# Patient Record
Sex: Female | Born: 1985 | Race: Black or African American | Hispanic: No | Marital: Single | Smoking: Former smoker
Health system: Southern US, Community
[De-identification: ages and names within clinical notes are randomized; demographics above are authoritative.]

## PROBLEM LIST (undated history)

## (undated) ENCOUNTER — Inpatient Hospital Stay (HOSPITAL_COMMUNITY): Payer: Self-pay

## (undated) ENCOUNTER — Emergency Department (HOSPITAL_COMMUNITY): Payer: Self-pay

## (undated) DIAGNOSIS — R519 Headache, unspecified: Secondary | ICD-10-CM

## (undated) DIAGNOSIS — J302 Other seasonal allergic rhinitis: Secondary | ICD-10-CM

## (undated) DIAGNOSIS — Z9114 Patient's other noncompliance with medication regimen: Secondary | ICD-10-CM

## (undated) DIAGNOSIS — Z5189 Encounter for other specified aftercare: Secondary | ICD-10-CM

## (undated) DIAGNOSIS — T7840XA Allergy, unspecified, initial encounter: Secondary | ICD-10-CM

## (undated) DIAGNOSIS — E0590 Thyrotoxicosis, unspecified without thyrotoxic crisis or storm: Secondary | ICD-10-CM

## (undated) DIAGNOSIS — K5190 Ulcerative colitis, unspecified, without complications: Secondary | ICD-10-CM

## (undated) DIAGNOSIS — D649 Anemia, unspecified: Secondary | ICD-10-CM

## (undated) DIAGNOSIS — F419 Anxiety disorder, unspecified: Secondary | ICD-10-CM

## (undated) HISTORY — DX: Anxiety disorder, unspecified: F41.9

## (undated) HISTORY — DX: Other seasonal allergic rhinitis: J30.2

## (undated) HISTORY — PX: TONSILLECTOMY: SUR1361

## (undated) HISTORY — DX: Allergy, unspecified, initial encounter: T78.40XA

---
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
## 2005-11-20 DIAGNOSIS — K5190 Ulcerative colitis, unspecified, without complications: Secondary | ICD-10-CM

## 2005-11-20 DIAGNOSIS — K5180 Other ulcerative colitis without complications: Secondary | ICD-10-CM | POA: Insufficient documentation

## 2005-11-20 HISTORY — DX: Ulcerative colitis, unspecified, without complications: K51.90

## 2006-04-02 ENCOUNTER — Emergency Department (HOSPITAL_COMMUNITY): Admission: EM | Admit: 2006-04-02 | Discharge: 2006-04-02 | Payer: Self-pay | Admitting: Emergency Medicine

## 2006-11-20 DIAGNOSIS — D649 Anemia, unspecified: Secondary | ICD-10-CM

## 2006-11-20 HISTORY — DX: Anemia, unspecified: D64.9

## 2007-01-15 ENCOUNTER — Inpatient Hospital Stay (HOSPITAL_COMMUNITY): Admission: EM | Admit: 2007-01-15 | Discharge: 2007-01-23 | Payer: Self-pay | Admitting: Emergency Medicine

## 2007-01-15 ENCOUNTER — Ambulatory Visit (HOSPITAL_COMMUNITY): Admission: RE | Admit: 2007-01-15 | Discharge: 2007-01-15 | Payer: Self-pay | Admitting: Gastroenterology

## 2007-01-18 ENCOUNTER — Encounter (INDEPENDENT_AMBULATORY_CARE_PROVIDER_SITE_OTHER): Payer: Self-pay | Admitting: Specialist

## 2007-01-24 ENCOUNTER — Ambulatory Visit: Payer: Self-pay | Admitting: Gastroenterology

## 2007-04-11 ENCOUNTER — Ambulatory Visit (HOSPITAL_COMMUNITY): Admission: RE | Admit: 2007-04-11 | Discharge: 2007-04-11 | Payer: Self-pay | Admitting: Gastroenterology

## 2007-09-11 ENCOUNTER — Emergency Department (HOSPITAL_COMMUNITY): Admission: EM | Admit: 2007-09-11 | Discharge: 2007-09-11 | Payer: Self-pay | Admitting: Emergency Medicine

## 2007-09-17 ENCOUNTER — Ambulatory Visit: Payer: Self-pay | Admitting: Family Medicine

## 2007-09-19 ENCOUNTER — Ambulatory Visit: Payer: Self-pay | Admitting: *Deleted

## 2007-12-06 ENCOUNTER — Inpatient Hospital Stay (HOSPITAL_COMMUNITY): Admission: EM | Admit: 2007-12-06 | Discharge: 2007-12-08 | Payer: Self-pay | Admitting: Emergency Medicine

## 2007-12-10 ENCOUNTER — Emergency Department (HOSPITAL_COMMUNITY): Admission: EM | Admit: 2007-12-10 | Discharge: 2007-12-11 | Payer: Self-pay | Admitting: Emergency Medicine

## 2007-12-12 ENCOUNTER — Ambulatory Visit: Payer: Self-pay | Admitting: Family Medicine

## 2008-01-03 ENCOUNTER — Ambulatory Visit: Payer: Self-pay | Admitting: Internal Medicine

## 2008-01-19 ENCOUNTER — Emergency Department (HOSPITAL_COMMUNITY): Admission: EM | Admit: 2008-01-19 | Discharge: 2008-01-20 | Payer: Self-pay | Admitting: Emergency Medicine

## 2008-02-20 ENCOUNTER — Ambulatory Visit: Payer: Self-pay | Admitting: Family Medicine

## 2008-03-16 ENCOUNTER — Ambulatory Visit: Payer: Self-pay | Admitting: Family Medicine

## 2008-03-16 LAB — CONVERTED CEMR LAB
AST: 13 units/L (ref 0–37)
BUN: 6 mg/dL (ref 6–23)
Basophils Absolute: 0 10*3/uL (ref 0.0–0.1)
Calcium: 8.9 mg/dL (ref 8.4–10.5)
Chloride: 109 meq/L (ref 96–112)
Creatinine, Ser: 0.65 mg/dL (ref 0.40–1.20)
Eosinophils Absolute: 0.5 10*3/uL (ref 0.0–0.7)
Eosinophils Relative: 5 % (ref 0–5)
HCT: 25.7 % — ABNORMAL LOW (ref 36.0–46.0)
Hemoglobin: 7.3 g/dL — CL (ref 12.0–15.0)
Lymphocytes Relative: 37 % (ref 12–46)
MCHC: 28.9 g/dL — ABNORMAL LOW (ref 30.0–36.0)
MCV: 70.1 fL — ABNORMAL LOW (ref 78.0–100.0)
Monocytes Absolute: 1.5 10*3/uL — ABNORMAL HIGH (ref 0.1–1.0)
Platelets: 398 10*3/uL (ref 150–400)
RDW: 19.6 % — ABNORMAL HIGH (ref 11.5–15.5)
TSH: 0.287 microintl units/mL — ABNORMAL LOW (ref 0.350–5.50)
Total Bilirubin: 0.1 mg/dL — ABNORMAL LOW (ref 0.3–1.2)

## 2008-03-17 ENCOUNTER — Encounter (INDEPENDENT_AMBULATORY_CARE_PROVIDER_SITE_OTHER): Payer: Self-pay | Admitting: Family Medicine

## 2008-03-17 LAB — CONVERTED CEMR LAB
Iron: <10 ug/dL — ABNORMAL LOW (ref 42–145)
Retic Ct Pct: 1.1 % (ref 0.4–3.1)
UIBC: 419 ug/dL

## 2008-04-23 ENCOUNTER — Encounter (INDEPENDENT_AMBULATORY_CARE_PROVIDER_SITE_OTHER): Payer: Self-pay | Admitting: Family Medicine

## 2008-04-23 ENCOUNTER — Ambulatory Visit: Payer: Self-pay | Admitting: Internal Medicine

## 2008-04-23 LAB — CONVERTED CEMR LAB
Basophils Absolute: 0 10*3/uL (ref 0.0–0.1)
Eosinophils Absolute: 0.2 10*3/uL (ref 0.0–0.7)
Eosinophils Relative: 2 % (ref 0–5)
HCT: 27.5 % — ABNORMAL LOW (ref 36.0–46.0)
MCV: 70.7 fL — ABNORMAL LOW (ref 78.0–100.0)
Platelets: 707 10*3/uL — ABNORMAL HIGH (ref 150–400)
RDW: 23.4 % — ABNORMAL HIGH (ref 11.5–15.5)

## 2008-06-18 ENCOUNTER — Emergency Department (HOSPITAL_COMMUNITY): Admission: EM | Admit: 2008-06-18 | Discharge: 2008-06-18 | Payer: Self-pay | Admitting: Family Medicine

## 2008-06-20 ENCOUNTER — Emergency Department (HOSPITAL_COMMUNITY): Admission: EM | Admit: 2008-06-20 | Discharge: 2008-06-21 | Payer: Self-pay | Admitting: Emergency Medicine

## 2008-06-21 LAB — CONVERTED CEMR LAB
CO2: 24 meq/L
Chloride: 104 meq/L
Platelets: 296 10*3/uL
Potassium: 3.9 meq/L
Sodium: 134 meq/L
WBC: 11.9 10*3/uL

## 2008-06-22 LAB — CONVERTED CEMR LAB: OCCULT 1: POSITIVE

## 2008-06-25 ENCOUNTER — Encounter (INDEPENDENT_AMBULATORY_CARE_PROVIDER_SITE_OTHER): Payer: Self-pay | Admitting: Nurse Practitioner

## 2008-06-30 ENCOUNTER — Ambulatory Visit: Payer: Self-pay | Admitting: Nurse Practitioner

## 2008-06-30 DIAGNOSIS — D649 Anemia, unspecified: Secondary | ICD-10-CM

## 2008-07-29 ENCOUNTER — Ambulatory Visit: Payer: Self-pay | Admitting: Nurse Practitioner

## 2008-07-29 DIAGNOSIS — L708 Other acne: Secondary | ICD-10-CM | POA: Insufficient documentation

## 2008-07-29 DIAGNOSIS — K5900 Constipation, unspecified: Secondary | ICD-10-CM

## 2008-07-30 ENCOUNTER — Encounter (INDEPENDENT_AMBULATORY_CARE_PROVIDER_SITE_OTHER): Payer: Self-pay | Admitting: Nurse Practitioner

## 2008-07-30 ENCOUNTER — Ambulatory Visit (HOSPITAL_COMMUNITY): Admission: RE | Admit: 2008-07-30 | Discharge: 2008-07-30 | Payer: Self-pay | Admitting: Nurse Practitioner

## 2008-07-30 LAB — CONVERTED CEMR LAB
Basophils Absolute: 0 10*3/uL (ref 0.0–0.1)
Eosinophils Relative: 3 % (ref 0–5)
HCT: 32.5 % — ABNORMAL LOW (ref 36.0–46.0)
Hemoglobin: 9.8 g/dL — ABNORMAL LOW (ref 12.0–15.0)
Lymphocytes Relative: 36 % (ref 12–46)
MCHC: 30.2 g/dL (ref 30.0–36.0)
Monocytes Absolute: 1.1 10*3/uL — ABNORMAL HIGH (ref 0.1–1.0)
RDW: 26.4 % — ABNORMAL HIGH (ref 11.5–15.5)

## 2008-08-03 ENCOUNTER — Ambulatory Visit: Payer: Self-pay | Admitting: Nurse Practitioner

## 2008-08-03 DIAGNOSIS — E0590 Thyrotoxicosis, unspecified without thyrotoxic crisis or storm: Secondary | ICD-10-CM

## 2008-08-03 LAB — CONVERTED CEMR LAB
AST: 13 units/L (ref 0–37)
BUN: 6 mg/dL (ref 6–23)
Blood in Urine, dipstick: NEGATIVE
CO2: 22 meq/L (ref 19–32)
Calcium: 9.2 mg/dL (ref 8.4–10.5)
Chloride: 105 meq/L (ref 96–112)
Cholesterol: 175 mg/dL (ref 0–200)
Creatinine, Ser: 0.63 mg/dL (ref 0.40–1.20)
Glucose, Bld: 92 mg/dL (ref 70–99)
HDL: 54 mg/dL (ref >39)
Nitrite: NEGATIVE
Specific Gravity, Urine: 1.02
Total CHOL/HDL Ratio: 3.2
Triglycerides: 99 mg/dL (ref <150)
WBC Urine, dipstick: NEGATIVE

## 2008-08-04 ENCOUNTER — Emergency Department (HOSPITAL_COMMUNITY): Admission: EM | Admit: 2008-08-04 | Discharge: 2008-08-04 | Payer: Self-pay | Admitting: Emergency Medicine

## 2008-08-06 ENCOUNTER — Telehealth (INDEPENDENT_AMBULATORY_CARE_PROVIDER_SITE_OTHER): Payer: Self-pay | Admitting: Nurse Practitioner

## 2008-08-12 ENCOUNTER — Encounter (INDEPENDENT_AMBULATORY_CARE_PROVIDER_SITE_OTHER): Payer: Self-pay | Admitting: Nurse Practitioner

## 2008-09-03 ENCOUNTER — Telehealth (INDEPENDENT_AMBULATORY_CARE_PROVIDER_SITE_OTHER): Payer: Self-pay | Admitting: Nurse Practitioner

## 2008-09-07 ENCOUNTER — Telehealth (INDEPENDENT_AMBULATORY_CARE_PROVIDER_SITE_OTHER): Payer: Self-pay | Admitting: Nurse Practitioner

## 2008-09-15 ENCOUNTER — Encounter (INDEPENDENT_AMBULATORY_CARE_PROVIDER_SITE_OTHER): Payer: Self-pay | Admitting: Nurse Practitioner

## 2008-10-09 ENCOUNTER — Encounter (INDEPENDENT_AMBULATORY_CARE_PROVIDER_SITE_OTHER): Payer: Self-pay | Admitting: Nurse Practitioner

## 2009-03-18 ENCOUNTER — Telehealth (INDEPENDENT_AMBULATORY_CARE_PROVIDER_SITE_OTHER): Payer: Self-pay | Admitting: Nurse Practitioner

## 2009-04-21 ENCOUNTER — Emergency Department (HOSPITAL_COMMUNITY): Admission: EM | Admit: 2009-04-21 | Discharge: 2009-04-21 | Payer: Self-pay | Admitting: Family Medicine

## 2009-05-20 ENCOUNTER — Ambulatory Visit: Payer: Self-pay | Admitting: Nurse Practitioner

## 2009-05-29 ENCOUNTER — Emergency Department (HOSPITAL_COMMUNITY): Admission: EM | Admit: 2009-05-29 | Discharge: 2009-05-29 | Payer: Self-pay | Admitting: Family Medicine

## 2009-05-30 ENCOUNTER — Emergency Department (HOSPITAL_COMMUNITY): Admission: EM | Admit: 2009-05-30 | Discharge: 2009-05-31 | Payer: Self-pay | Admitting: Emergency Medicine

## 2009-06-02 ENCOUNTER — Telehealth (INDEPENDENT_AMBULATORY_CARE_PROVIDER_SITE_OTHER): Payer: Self-pay | Admitting: Nurse Practitioner

## 2009-06-03 ENCOUNTER — Inpatient Hospital Stay (HOSPITAL_COMMUNITY): Admission: EM | Admit: 2009-06-03 | Discharge: 2009-06-10 | Payer: Self-pay | Admitting: Emergency Medicine

## 2009-06-08 ENCOUNTER — Encounter (INDEPENDENT_AMBULATORY_CARE_PROVIDER_SITE_OTHER): Payer: Self-pay | Admitting: Gastroenterology

## 2009-06-11 ENCOUNTER — Encounter (INDEPENDENT_AMBULATORY_CARE_PROVIDER_SITE_OTHER): Payer: Self-pay | Admitting: *Deleted

## 2009-07-15 ENCOUNTER — Encounter (INDEPENDENT_AMBULATORY_CARE_PROVIDER_SITE_OTHER): Payer: Self-pay | Admitting: *Deleted

## 2009-08-05 ENCOUNTER — Encounter (INDEPENDENT_AMBULATORY_CARE_PROVIDER_SITE_OTHER): Payer: Self-pay | Admitting: Nurse Practitioner

## 2009-08-14 ENCOUNTER — Ambulatory Visit: Payer: Self-pay | Admitting: Infectious Diseases

## 2009-08-14 ENCOUNTER — Encounter (INDEPENDENT_AMBULATORY_CARE_PROVIDER_SITE_OTHER): Payer: Self-pay | Admitting: Dermatology

## 2009-08-14 ENCOUNTER — Inpatient Hospital Stay (HOSPITAL_COMMUNITY): Admission: EM | Admit: 2009-08-14 | Discharge: 2009-08-16 | Payer: Self-pay | Admitting: Emergency Medicine

## 2009-08-16 ENCOUNTER — Encounter (INDEPENDENT_AMBULATORY_CARE_PROVIDER_SITE_OTHER): Payer: Self-pay | Admitting: Dermatology

## 2009-12-22 ENCOUNTER — Ambulatory Visit: Payer: Self-pay | Admitting: Nurse Practitioner

## 2009-12-22 DIAGNOSIS — K029 Dental caries, unspecified: Secondary | ICD-10-CM | POA: Insufficient documentation

## 2010-01-24 ENCOUNTER — Encounter (INDEPENDENT_AMBULATORY_CARE_PROVIDER_SITE_OTHER): Payer: Self-pay | Admitting: Nurse Practitioner

## 2010-01-26 ENCOUNTER — Telehealth (INDEPENDENT_AMBULATORY_CARE_PROVIDER_SITE_OTHER): Payer: Self-pay | Admitting: Nurse Practitioner

## 2010-02-01 ENCOUNTER — Telehealth (INDEPENDENT_AMBULATORY_CARE_PROVIDER_SITE_OTHER): Payer: Self-pay | Admitting: Nurse Practitioner

## 2010-03-09 ENCOUNTER — Emergency Department (HOSPITAL_COMMUNITY): Admission: EM | Admit: 2010-03-09 | Discharge: 2010-03-09 | Payer: Self-pay | Admitting: Family Medicine

## 2010-05-31 ENCOUNTER — Telehealth (INDEPENDENT_AMBULATORY_CARE_PROVIDER_SITE_OTHER): Payer: Self-pay | Admitting: Nurse Practitioner

## 2010-07-14 ENCOUNTER — Emergency Department (HOSPITAL_COMMUNITY): Admission: EM | Admit: 2010-07-14 | Discharge: 2010-07-14 | Payer: Self-pay | Admitting: Family Medicine

## 2010-09-16 ENCOUNTER — Encounter (INDEPENDENT_AMBULATORY_CARE_PROVIDER_SITE_OTHER): Payer: Self-pay | Admitting: Nurse Practitioner

## 2010-09-21 ENCOUNTER — Emergency Department (HOSPITAL_COMMUNITY)
Admission: EM | Admit: 2010-09-21 | Discharge: 2010-09-21 | Payer: Self-pay | Source: Home / Self Care | Admitting: Family Medicine

## 2010-11-04 ENCOUNTER — Ambulatory Visit: Payer: Self-pay | Admitting: Nurse Practitioner

## 2010-11-20 NOTE — L&D Delivery Note (Signed)
Delivery Note Pt slowly progressed to complete dilation and then pushed well and at 5:45 AM a viable female was delivered via Vaginal, Spontaneous Delivery (Presentation: ;  ).  APGAR: 6, 8; weight 5 lb 7.8 oz (2490 g).  The baby was initially very floppy and required bag/mask with Dr. Norval Gable called to attend--responded very quickly to this and was taken to regular nursery for observation.  Mother developed temp in labor and was placed on Unasyn for presumed chorioamnionitis.  This will be continued postpartum. Placenta status:  delivered spontaneously and sent to pathology.  Cord: 3 vessels with the following complications: .  Cord pH: 7.2  Anesthesia: Epidural  Episiotomy: no Lacerations: first degree Suture Repair: 3.0 vicryl rapide Est. Blood Loss (mL):450cc   Mom to postpartum.  Baby to nursery-stable.  Logan Bores 10/21/2011, 6:26 AM

## 2010-12-15 ENCOUNTER — Emergency Department (HOSPITAL_COMMUNITY)
Admission: EM | Admit: 2010-12-15 | Discharge: 2010-12-15 | Payer: Self-pay | Source: Home / Self Care | Admitting: Family Medicine

## 2010-12-15 LAB — CBC
MCH: 25.3 pg — ABNORMAL LOW (ref 26.0–34.0)
MCHC: 32 g/dL (ref 30.0–36.0)
Platelets: 379 10*3/uL (ref 150–400)
RDW: 17.2 % — ABNORMAL HIGH (ref 11.5–15.5)

## 2010-12-15 LAB — IRON AND TIBC
Iron: 21 ug/dL — ABNORMAL LOW (ref 42–135)
TIBC: 454 ug/dL (ref 250–470)

## 2010-12-15 LAB — FERRITIN: Ferritin: 3 ng/mL — ABNORMAL LOW (ref 10–291)

## 2010-12-20 NOTE — Progress Notes (Signed)
Summary: TEETH PAIN/NEED RX  Phone Note Call from Patient Call back at [phone] CELL   Summary of Call: [place] ASKING FOR SOME MEDICATION FOR HER TEETH PAIN. SHE DID DO ELIGIBILITY AND I TOLD HER TO CALL THE DENTAL CLINIC AND CHECK ON HER REFERRAL.  SHE USES [facility]. Initial call taken by: Roberto Scales,  May 31, 2010 12:59 PM  Follow-up for Phone Call        spoke with pt and she said that she has been dealing with pain in her teeth for a year but she has had some major pain for the past month and is asking if she can get something for the pain.  Follow-up by: Isla Pence,  May 31, 2010 3:41 PM  Additional Follow-up for Phone Call Additional follow up Details #1::        have been through this before with pt and teeth pain  dental referral outstanding since 12/2009 she needs to call and see where she is on the list if phone # has changed they may have had trouble reaching her will fill tylenol #3 as needed for pain (rx in basket) most importantly pt needs dental care  Additional Follow-up by: Aurora Mask FNP,  May 31, 2010 3:46 PM    Additional Follow-up for Phone Call Additional follow up Details #2::    Isla Pence  June 01, 2010 4:22 PM Left message on machine for pt to return call to the office.  Rx faxed to [place] Pt notified.   Follow-up by: Isla Pence,  June 02, 2010 12:47 PM  Prescriptions: TYLENOL WITH CODEINE #3 300-30 MG TABS (ACETAMINOPHEN-CODEINE) One tablet by mouth two times a day as needed for pain  #30 x 0   Entered and Authorized by:   Aurora Mask FNP   Signed by:   Aurora Mask FNP on 05/31/2010   Method used:   Printed then faxed to ...       [facility] (retail)       [address] [place], [place]  [address]       Ph: [phone] [place]       Fax: [phone]   RxID:   [id number]

## 2010-12-20 NOTE — Letter (Signed)
Summary: DENTAL REFERRAL  DENTAL REFERRAL   Imported By: Roland Earl 03/23/2010 11:50:50  _____________________________________________________________________  External Attachment:    Type:   Image     Comment:   External Document

## 2010-12-20 NOTE — Assessment & Plan Note (Signed)
Summary: Acute - Dental pain   Vital Signs:  Patient profile:   25 year old female LMP:     11/2009 Weight:      114.1 pounds BMI:     23.93 BSA:     1.44 Temp:     97.7 degrees F oral Pulse rate:   80 / minute Pulse rhythm:   regular Resp:     20 per minute BP sitting:   120 / 70  (left arm) Cuff size:   regular  Vitals Entered By: Isla Pence (December 22, 2009 12:05 PM) CC: tooth pain x months pain that comes and goes Is Patient Diabetic? No Pain Assessment Patient in pain? yes     Location: tooth  Does patient need assistance? Functional Status Self care Ambulation Normal LMP (date): 11/2009 LMP - Character: normal     Enter LMP: 11/2009 Last PAP Result  Interpretation/Result:Negative for intraepithelial Lesion or Malignancy.      CC:  tooth pain x months pain that comes and goes.  History of Present Illness:  Pt into the office with tooth pain. Area of concern in Left upper molar and right lower molar. Pain is intermittent.  No sensitivity with hot or cold but texture makes eating on the right side difficult. no swelling of the jaw no fever Unable to recall the last dental appointment  Allergies (verified): 1)  ! * Latex  Review of Systems General:  Denies fever; mouth pain. CV:  Denies fatigue. Resp:  Denies cough. GI:  Denies abdominal pain and bloody stools; stable symptoms.  reports f/u with GI earlier this week.  Admits that she is taking meds as ordered.  Physical Exam  General:  alert.   Head:  normocephalic.   Mouth:  evidence of previous caries but still several teeth with evidenced caries Msk:  normal ROM.   Neurologic:  alert & oriented X3.     Impression & Recommendations:  Problem # 1:  DENTAL CARIES (ICD-521.00) advised pt that she needs dental referral fillings will give pain meds.  no indication for antibiotics at this time Orders: Dental Referral (Dentist)  Complete Medication List: 1)  Ferrous Sulfate 325 (65 Fe) Mg  Tabs (Ferrous sulfate) .[name].. 1 tablet by mouth two times a day to build up blood 2)  Colace 100 Mg Caps (Docusate sodium) .[name].. 1 capsule by mouth twice daily for constipation 3)  Lialda 1.2 Gm Tbec (Mesalamine) .... Please take 4 tabs by mouth daily starting September 12, 2009. 4)  Tylenol With Codeine #3 300-30 Mg Tabs (Acetaminophen-codeine) .... One tablet by mouth two times a day as needed for pain  Patient Instructions: 1)  You will be referred to the dental clinic.  You will be notified of the time/date of the appointment. 2)  You will need to get the tooth filled or removed as this is the cause for your pain. 3)  You can take pain medications two times a day as needed for pain. Prescriptions: TYLENOL WITH CODEINE #3 300-30 MG TABS (ACETAMINOPHEN-CODEINE) One tablet by mouth two times a day as needed for pain  #30 x 0   Entered and Authorized by:   Aurora Mask FNP   Signed by:   Aurora Mask FNP on 12/22/2009   Method used:   Print then Give to Patient   RxID:   [id number]

## 2010-12-20 NOTE — Letter (Signed)
Summary: MAILED REQUESTED RECORDS TO [facility]  MAILED REQUESTED RECORDS TO [facility] By: Roland Earl 09/16/2010 10:11:28  _____________________________________________________________________  External Attachment:    Type:   Image     Comment:   External Document

## 2010-12-20 NOTE — Progress Notes (Signed)
Summary: Pain medication  Phone Note Call from Patient   Summary of Call: when I spoke with pt on 3/14 and shared information from last note today pt is asking for something for pain. I asked her what pharmacy does she use she said [facility].  I told her that I would get this to her provider.  Initial call taken by: Isla Pence,  February 01, 2010 3:14 PM  Follow-up for Phone Call        Rx at station to fax to [facility] Please continue to stress to pt that she needs to find an affordable dentist that will remove her teeth  Pain medication is only temporary Follow-up by: Aurora Mask FNP,  February 01, 2010 3:21 PM  Additional Follow-up for Phone Call Additional follow up Details #1::        pt informed. Rx faxed to [facility]. Additional Follow-up by: Isla Pence,  February 01, 2010 3:59 PM    Prescriptions: TYLENOL WITH CODEINE #3 300-30 MG TABS (ACETAMINOPHEN-CODEINE) One tablet by mouth two times a day as needed for pain  #30 x 0   Entered and Authorized by:   Aurora Mask FNP   Signed by:   Aurora Mask FNP on 02/01/2010   Method used:   Print then Give to Patient   RxID:   [id number]

## 2010-12-20 NOTE — Progress Notes (Signed)
Summary: TEETH STILL HURTING  Phone Note Call from Patient Call back at Home Phone [phone]   Summary of Call: [place] SHE IS STILL HAVING REAL BAD TEETH PAIN AND AND SHE HAS HOLES IN BOTH OF THEM AND NOW SHE SAYS THAT SHE HAS SWELLING. PATIENT IS ASKING IFN YOU CAN CALL IN SOMETHING.  Keydi Giel I GOT A CALL FROM [place] THIS MRONING THAT ITS NOT AN EMERGENCY SHE WAS ONLY PUT ON PAIN MEDICINE. Initial call taken by: Roberto Scales,  January 26, 2010 2:27 PM  Follow-up for Phone Call        forward to Marva Panda, fnp Follow-up by: Isla Pence,  January 27, 2010 3:51 PM  Additional Follow-up for Phone Call Additional follow up Details #1::        Pt may have to get her own dental referral she does NOT have an abscess and does not substantiate an urgent dental referral. While i am aware the "holes" are likely cavities that have gradually progressed and yes she does need those teeth removed at this time the dental clinic to which we have access is unable to accommodate those without urgent needs and she fits that profile. I can refill her pain medications but again that is only short lived as she needs to find a dentist for extraction Additional Follow-up by: Aurora Mask FNP,  January 27, 2010 5:37 PM    Additional Follow-up for Phone Call Additional follow up Details #2::    Isla Pence  January 31, 2010 11:59 AM Left message on machine for pt to return call to the office.  Isla Pence  January 31, 2010 12:20 PM Pt informed.

## 2010-12-20 NOTE — Letter (Signed)
Summary: REFERRAL//DENTAL  REFERRAL//DENTAL   Imported By: Roland Earl 02/10/2010 12:28:16  _____________________________________________________________________  External Attachment:    Type:   Image     Comment:   External Document

## 2011-01-01 ENCOUNTER — Inpatient Hospital Stay (INDEPENDENT_AMBULATORY_CARE_PROVIDER_SITE_OTHER)
Admission: RE | Admit: 2011-01-01 | Discharge: 2011-01-01 | Disposition: A | Discharge status: Home / Self Care | Payer: Self-pay | Source: Ambulatory Visit | Attending: Family Medicine | Admitting: Family Medicine

## 2011-01-01 DIAGNOSIS — R109 Unspecified abdominal pain: Secondary | ICD-10-CM

## 2011-01-19 ENCOUNTER — Ambulatory Visit (HOSPITAL_COMMUNITY)
Admission: RE | Admit: 2011-01-19 | Discharge: 2011-01-19 | Disposition: A | Discharge status: Home / Self Care | Payer: Self-pay | Source: Ambulatory Visit | Attending: Gastroenterology | Admitting: Gastroenterology

## 2011-01-19 HISTORY — PX: COLONOSCOPY: SHX174

## 2011-02-03 LAB — POCT URINALYSIS DIPSTICK
Bilirubin Urine: NEGATIVE
Glucose, UA: NEGATIVE mg/dL
Ketones, ur: NEGATIVE mg/dL
Specific Gravity, Urine: 1.02 (ref 1.005–1.030)

## 2011-02-07 LAB — POCT I-STAT, CHEM 8
BUN: 5 mg/dL — ABNORMAL LOW (ref 6–23)
Creatinine, Ser: 0.3 mg/dL — ABNORMAL LOW (ref 0.4–1.2)
Potassium: 4 mEq/L (ref 3.5–5.1)
Sodium: 140 mEq/L (ref 135–145)

## 2011-02-21 NOTE — Op Note (Signed)
  NAMETAYLORE, Joyce Solomon NO.:  [id number]  MEDICAL RECORD NO.:  [id number]           PATIENT TYPE:  O  LOCATION:  WLEN                         FACILITY:  [facility]  PHYSICIAN:  Wonda Horner, M.D.   DATE OF BIRTH:  1986/01/09  DATE OF PROCEDURE:  01/19/2011 DATE OF DISCHARGE:                              OPERATIVE REPORT   INDICATIONS FOR PROCEDURE:  The patient is a 25 year old female with a history of ulcerative colitis.  She has been complaining of what she described as a flare-up and went to the emergency room for this, where she was given a short course of prednisone.  In talking to her in the office recently, I was unclear whether this was a flare-up or not and felt that proceeding with colonoscopy would be appropriate to determine if there was any activity of disease.  Informed consent was obtained after explanation of the risks of bleeding, infection and perforation.  PREMEDICATION:  Fentanyl 100 mcg IV, Versed 10 mg IV, Benadryl 25 mg IV.  PROCEDURE IN DETAIL:  With the patient in the left lateral decubitus position, a rectal exam was performed and no masses were felt.  The Pentax colonoscope was inserted into the rectum and advanced around the colon to the cecum.  Cecal landmarks were identified.  The cecum and ascending colon were normal.  The transverse colon normal.  The descending colon, sigmoid and rectum were normal.  Retroflexion was normal.  There was no evidence of activity of disease on this examination.  She tolerated the procedure well without complications.  IMPRESSION:  Normal colonoscopy with no evidence of active ulcerative colitis at this time.  PLAN:  Continue Lialda 4 pills daily.  There is no need for prednisone at this time.          ______________________________ Wonda Horner, M.D.     SFG/MEDQ  D:  01/19/2011  T:  01/19/2011  Job:  [id number]  Electronically Signed by Anson Fret MD on 02/21/2011 04:38:15 PM

## 2011-02-24 LAB — DIFFERENTIAL
Lymphocytes Relative: 14 % (ref 12–46)
Lymphs Abs: 2 10*3/uL (ref 0.7–4.0)
Monocytes Absolute: 0.7 10*3/uL (ref 0.1–1.0)
Monocytes Relative: 5 % (ref 3–12)
Neutro Abs: 10.7 10*3/uL — ABNORMAL HIGH (ref 1.7–7.7)
Neutrophils Relative %: 77 % (ref 43–77)

## 2011-02-24 LAB — COMPREHENSIVE METABOLIC PANEL
Albumin: 2.9 g/dL — ABNORMAL LOW (ref 3.5–5.2)
Alkaline Phosphatase: 65 U/L (ref 39–117)
BUN: 3 mg/dL — ABNORMAL LOW (ref 6–23)
Calcium: 8.4 mg/dL (ref 8.4–10.5)
Creatinine, Ser: 0.54 mg/dL (ref 0.4–1.2)
Glucose, Bld: 103 mg/dL — ABNORMAL HIGH (ref 70–99)
Potassium: 3.6 mEq/L (ref 3.5–5.1)
Total Protein: 6.8 g/dL (ref 6.0–8.3)

## 2011-02-24 LAB — CBC
HCT: 20.6 % — ABNORMAL LOW (ref 36.0–46.0)
Hemoglobin: 9.5 g/dL — ABNORMAL LOW (ref 12.0–15.0)
MCHC: 32.2 g/dL (ref 30.0–36.0)
MCHC: 32.3 g/dL (ref 30.0–36.0)
MCHC: 32.3 g/dL (ref 30.0–36.0)
Platelets: 518 10*3/uL — ABNORMAL HIGH (ref 150–400)
Platelets: 519 10*3/uL — ABNORMAL HIGH (ref 150–400)
Platelets: 600 10*3/uL — ABNORMAL HIGH (ref 150–400)
RBC: 3.75 MIL/uL — ABNORMAL LOW (ref 3.87–5.11)
RBC: 3.78 MIL/uL — ABNORMAL LOW (ref 3.87–5.11)
RBC: 3.94 MIL/uL (ref 3.87–5.11)
RDW: 22.5 % — ABNORMAL HIGH (ref 11.5–15.5)
WBC: 10 10*3/uL (ref 4.0–10.5)
WBC: 8.4 10*3/uL (ref 4.0–10.5)
WBC: 8.6 10*3/uL (ref 4.0–10.5)

## 2011-02-24 LAB — URINALYSIS, ROUTINE W REFLEX MICROSCOPIC
Glucose, UA: NEGATIVE mg/dL
Hgb urine dipstick: NEGATIVE
Specific Gravity, Urine: 1.016 (ref 1.005–1.030)
Urobilinogen, UA: 0.2 mg/dL (ref 0.0–1.0)

## 2011-02-24 LAB — BASIC METABOLIC PANEL
BUN: <1 mg/dL — ABNORMAL LOW (ref 6–23)
CO2: 24 mEq/L (ref 19–32)
Calcium: 8.3 mg/dL — ABNORMAL LOW (ref 8.4–10.5)
Calcium: 8.3 mg/dL — ABNORMAL LOW (ref 8.4–10.5)
Chloride: 110 mEq/L (ref 96–112)
Creatinine, Ser: 0.63 mg/dL (ref 0.4–1.2)
GFR calc Af Amer: >60 mL/min (ref >60)
GFR calc Af Amer: >60 mL/min (ref >60)
GFR calc non Af Amer: >60 mL/min (ref >60)
Sodium: 138 mEq/L (ref 135–145)

## 2011-02-24 LAB — TYPE AND SCREEN
Antibody Screen: POSITIVE
DAT, IgG: NEGATIVE

## 2011-02-24 LAB — CLOSTRIDIUM DIFFICILE EIA: C difficile Toxins A+B, EIA: NEGATIVE

## 2011-02-24 LAB — SEDIMENTATION RATE: Sed Rate: 85 mm/hr — ABNORMAL HIGH (ref 0–22)

## 2011-02-24 LAB — URINE MICROSCOPIC-ADD ON

## 2011-02-24 LAB — POCT PREGNANCY, URINE: Preg Test, Ur: NEGATIVE

## 2011-02-26 LAB — TSH: TSH: 0.412 u[IU]/mL (ref 0.350–4.500)

## 2011-02-26 LAB — CBC
HCT: 36.3 % (ref 36.0–46.0)
HCT: 37.9 % (ref 36.0–46.0)
HCT: 19.9 % — ABNORMAL LOW (ref 36.0–46.0)
Hemoglobin: 11.9 g/dL — ABNORMAL LOW (ref 12.0–15.0)
Hemoglobin: 12.8 g/dL (ref 12.0–15.0)
Hemoglobin: 13.5 g/dL (ref 12.0–15.0)
MCHC: 32.5 g/dL (ref 30.0–36.0)
MCHC: 32.6 g/dL (ref 30.0–36.0)
MCHC: 32.8 g/dL (ref 30.0–36.0)
MCHC: 32.8 g/dL (ref 30.0–36.0)
MCHC: 33.1 g/dL (ref 30.0–36.0)
MCV: 78.6 fL (ref 78.0–100.0)
MCV: 79 fL (ref 78.0–100.0)
MCV: 79.8 fL (ref 78.0–100.0)
MCV: 80.3 fL (ref 78.0–100.0)
MCV: 78.8 fL (ref 78.0–100.0)
Platelets: 623 10*3/uL — ABNORMAL HIGH (ref 150–400)
Platelets: 607 10*3/uL — ABNORMAL HIGH (ref 150–400)
Platelets: 666 10*3/uL — ABNORMAL HIGH (ref 150–400)
RBC: 4.62 MIL/uL (ref 3.87–5.11)
RBC: 4.62 MIL/uL (ref 3.87–5.11)
RBC: 4.82 MIL/uL (ref 3.87–5.11)
RBC: 4.83 MIL/uL (ref 3.87–5.11)
RBC: 4.9 MIL/uL (ref 3.87–5.11)
RBC: 5.02 MIL/uL (ref 3.87–5.11)
RBC: 5.22 MIL/uL — ABNORMAL HIGH (ref 3.87–5.11)
RDW: 26.5 % — ABNORMAL HIGH (ref 11.5–15.5)
RDW: 28.5 % — ABNORMAL HIGH (ref 11.5–15.5)
RDW: 29.4 % — ABNORMAL HIGH (ref 11.5–15.5)
RDW: 28.6 % — ABNORMAL HIGH (ref 11.5–15.5)
RDW: 29.6 % — ABNORMAL HIGH (ref 11.5–15.5)
WBC: 13.4 10*3/uL — ABNORMAL HIGH (ref 4.0–10.5)
WBC: 19.9 10*3/uL — ABNORMAL HIGH (ref 4.0–10.5)
WBC: 20.1 10*3/uL — ABNORMAL HIGH (ref 4.0–10.5)
WBC: 11.3 10*3/uL — ABNORMAL HIGH (ref 4.0–10.5)

## 2011-02-26 LAB — DIFFERENTIAL
Band Neutrophils: 14 % — ABNORMAL HIGH (ref 0–10)
Basophils Absolute: 0 10*3/uL (ref 0.0–0.1)
Basophils Relative: 0 % (ref 0–1)
Blasts: 0 %
Lymphocytes Relative: 15 % (ref 12–46)
Lymphs Abs: 1.7 10*3/uL (ref 0.7–4.0)
Metamyelocytes Relative: 0 %
Myelocytes: 0 %
Promyelocytes Absolute: 0 %

## 2011-02-26 LAB — COMPREHENSIVE METABOLIC PANEL
ALT: 12 U/L (ref 0–35)
ALT: 14 U/L (ref 0–35)
AST: 19 U/L (ref 0–37)
Albumin: 3.3 g/dL — ABNORMAL LOW (ref 3.5–5.2)
Albumin: 3.3 g/dL — ABNORMAL LOW (ref 3.5–5.2)
Alkaline Phosphatase: 64 U/L (ref 39–117)
Alkaline Phosphatase: 77 U/L (ref 39–117)
Chloride: 103 mEq/L (ref 96–112)
GFR calc Af Amer: >60 mL/min (ref >60)
Potassium: 3.2 mEq/L — ABNORMAL LOW (ref 3.5–5.1)
Potassium: 4.1 mEq/L (ref 3.5–5.1)
Sodium: 135 mEq/L (ref 135–145)
Sodium: 136 mEq/L (ref 135–145)
Total Bilirubin: 0.3 mg/dL (ref 0.3–1.2)
Total Protein: 7.6 g/dL (ref 6.0–8.3)
Total Protein: 7.7 g/dL (ref 6.0–8.3)

## 2011-02-26 LAB — CROSSMATCH
DAT, IgG: POSITIVE
DAT, complement: NEGATIVE

## 2011-02-26 LAB — BASIC METABOLIC PANEL
BUN: 9 mg/dL (ref 6–23)
CO2: 23 mEq/L (ref 19–32)
CO2: 25 mEq/L (ref 19–32)
CO2: 29 mEq/L (ref 19–32)
Calcium: 8.8 mg/dL (ref 8.4–10.5)
Calcium: 8.9 mg/dL (ref 8.4–10.5)
Calcium: 9 mg/dL (ref 8.4–10.5)
Calcium: 9 mg/dL (ref 8.4–10.5)
Calcium: 9.1 mg/dL (ref 8.4–10.5)
Calcium: 9.4 mg/dL (ref 8.4–10.5)
Chloride: 103 mEq/L (ref 96–112)
Chloride: 98 mEq/L (ref 96–112)
Chloride: 99 mEq/L (ref 96–112)
Creatinine, Ser: 0.51 mg/dL (ref 0.4–1.2)
Creatinine, Ser: 0.57 mg/dL (ref 0.4–1.2)
Creatinine, Ser: 0.67 mg/dL (ref 0.4–1.2)
GFR calc Af Amer: >60 mL/min (ref >60)
GFR calc Af Amer: >60 mL/min (ref >60)
GFR calc Af Amer: >60 mL/min (ref >60)
GFR calc Af Amer: >60 mL/min (ref >60)
GFR calc Af Amer: >60 mL/min (ref >60)
GFR calc non Af Amer: >60 mL/min (ref >60)
GFR calc non Af Amer: >60 mL/min (ref >60)
Glucose, Bld: 115 mg/dL — ABNORMAL HIGH (ref 70–99)
Glucose, Bld: 118 mg/dL — ABNORMAL HIGH (ref 70–99)
Glucose, Bld: 96 mg/dL (ref 70–99)
Sodium: 130 mEq/L — ABNORMAL LOW (ref 135–145)
Sodium: 136 mEq/L (ref 135–145)
Sodium: 137 mEq/L (ref 135–145)

## 2011-02-26 LAB — POCT I-STAT, CHEM 8
Calcium, Ion: 1.21 mmol/L (ref 1.12–1.32)
Creatinine, Ser: 0.7 mg/dL (ref 0.4–1.2)
Glucose, Bld: 78 mg/dL (ref 70–99)
HCT: 25 % — ABNORMAL LOW (ref 36.0–46.0)
Hemoglobin: 8.5 g/dL — ABNORMAL LOW (ref 12.0–15.0)
Potassium: 3.9 mEq/L (ref 3.5–5.1)

## 2011-02-26 LAB — URINALYSIS, ROUTINE W REFLEX MICROSCOPIC
Bilirubin Urine: NEGATIVE
Glucose, UA: NEGATIVE mg/dL
Protein, ur: 100 mg/dL — AB
Specific Gravity, Urine: 1.045 — ABNORMAL HIGH (ref 1.005–1.030)
Urobilinogen, UA: 0.2 mg/dL (ref 0.0–1.0)

## 2011-02-26 LAB — CLOSTRIDIUM DIFFICILE EIA

## 2011-02-26 LAB — HEPATITIS B SURFACE ANTIBODY,QUALITATIVE: Hep B S Ab: POSITIVE — AB

## 2011-02-26 LAB — POCT PREGNANCY, URINE: Preg Test, Ur: NEGATIVE

## 2011-02-26 LAB — PROTIME-INR
INR: 1.2 (ref 0.00–1.49)
Prothrombin Time: 15.2 seconds (ref 11.6–15.2)

## 2011-02-26 LAB — URINE MICROSCOPIC-ADD ON

## 2011-02-26 LAB — MAGNESIUM: Magnesium: 2 mg/dL (ref 1.5–2.5)

## 2011-02-26 LAB — GIARDIA/CRYPTOSPORIDIUM SCREEN(EIA): Cryptosporidium Screen (EIA): NEGATIVE

## 2011-02-26 LAB — STOOL CULTURE

## 2011-02-26 LAB — PREPARE RBC (CROSSMATCH)

## 2011-02-27 LAB — POCT I-STAT, CHEM 8
Calcium, Ion: 1.19 mmol/L (ref 1.12–1.32)
Creatinine, Ser: 0.6 mg/dL (ref 0.4–1.2)
Glucose, Bld: 88 mg/dL (ref 70–99)
Hemoglobin: 9.5 g/dL — ABNORMAL LOW (ref 12.0–15.0)
Sodium: 137 mEq/L (ref 135–145)
TCO2: 23 mmol/L (ref 0–100)

## 2011-02-27 LAB — POCT PREGNANCY, URINE: Preg Test, Ur: NEGATIVE

## 2011-02-27 LAB — POCT URINALYSIS DIP (DEVICE)
Protein, ur: 30 mg/dL — AB
Specific Gravity, Urine: 1.025 (ref 1.005–1.030)
Urobilinogen, UA: 0.2 mg/dL (ref 0.0–1.0)

## 2011-03-08 ENCOUNTER — Inpatient Hospital Stay (INDEPENDENT_AMBULATORY_CARE_PROVIDER_SITE_OTHER)
Admission: RE | Admit: 2011-03-08 | Discharge: 2011-03-08 | Disposition: A | Discharge status: Home / Self Care | Payer: Self-pay | Source: Ambulatory Visit | Attending: Emergency Medicine | Admitting: Emergency Medicine

## 2011-03-08 DIAGNOSIS — R6889 Other general symptoms and signs: Secondary | ICD-10-CM

## 2011-03-08 DIAGNOSIS — Z331 Pregnant state, incidental: Secondary | ICD-10-CM

## 2011-03-09 LAB — POCT URINALYSIS DIP (DEVICE)
Bilirubin Urine: NEGATIVE
Glucose, UA: NEGATIVE mg/dL
Ketones, ur: NEGATIVE mg/dL
Nitrite: NEGATIVE

## 2011-03-09 LAB — POCT PREGNANCY, URINE: Preg Test, Ur: POSITIVE

## 2011-03-13 ENCOUNTER — Emergency Department (HOSPITAL_COMMUNITY)
Admission: EM | Admit: 2011-03-13 | Discharge: 2011-03-13 | Disposition: A | Discharge status: Home / Self Care | Payer: Medicaid Other | Attending: Emergency Medicine | Admitting: Emergency Medicine

## 2011-03-13 ENCOUNTER — Emergency Department (HOSPITAL_COMMUNITY): Payer: Medicaid Other

## 2011-03-13 DIAGNOSIS — K5190 Ulcerative colitis, unspecified, without complications: Secondary | ICD-10-CM | POA: Insufficient documentation

## 2011-03-13 DIAGNOSIS — N898 Other specified noninflammatory disorders of vagina: Secondary | ICD-10-CM | POA: Insufficient documentation

## 2011-03-13 DIAGNOSIS — R109 Unspecified abdominal pain: Secondary | ICD-10-CM | POA: Insufficient documentation

## 2011-03-13 DIAGNOSIS — O99891 Other specified diseases and conditions complicating pregnancy: Secondary | ICD-10-CM | POA: Insufficient documentation

## 2011-03-13 LAB — URINALYSIS, ROUTINE W REFLEX MICROSCOPIC
Glucose, UA: NEGATIVE mg/dL
Ketones, ur: NEGATIVE mg/dL
Nitrite: NEGATIVE
Protein, ur: NEGATIVE mg/dL
Urobilinogen, UA: 0.2 mg/dL (ref 0.0–1.0)

## 2011-03-13 LAB — WET PREP, GENITAL: WBC, Wet Prep HPF POC: NONE SEEN

## 2011-03-13 LAB — POCT PREGNANCY, URINE: Preg Test, Ur: POSITIVE

## 2011-03-14 LAB — GC/CHLAMYDIA PROBE AMP, GENITAL
Chlamydia, DNA Probe: NEGATIVE
GC Probe Amp, Genital: NEGATIVE

## 2011-04-04 NOTE — Consult Note (Signed)
Joyce Solomon, Joyce Solomon            ACCOUNT NO.:  [id number]   MEDICAL RECORD NO.:  [id number]          PATIENT TYPE:  INP   LOCATION:  6709                         FACILITY:  [facility]   PHYSICIAN:  Earle Gell, M.D.   DATE OF BIRTH:  01/21/1986   DATE OF CONSULTATION:  06/03/2009  DATE OF DISCHARGE:                                 CONSULTATION   ADMISSION DIAGNOSES:  1. 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  2. Abdominal pain, vomiting, bloody diarrhea.  3. Iron-deficiency anemia (chronic).   HISTORY:  Joyce Solomon is a 25 year old female born on 12-04-1985.  Joyce Solomon was diagnosed with universal ulcerative  proctocolitis by colonoscopy with biopsy in March 2008 performed by Dr.  Benson Norway.   The patient is followed at [facility].  By history, she  has not been in a symptomatic remission since her diagnosis of  ulcerative colitis, despite taking Asacol.  She tells me she has been on  prednisone in the past with good symptomatic response.   The patient typically has 5-6 diarrheal bowel movements daily.   She came to the emergency room when she developed abdominal cramps,  vomiting, and lightheadedness.  She is feeling much better today.   The patient does report a poor appetite.  Her menses are regular and are  not heavy by history.   ALLERGIES:  LATEX.   CURRENT MEDICATIONS:  Cipro, Flagyl, Lasix, Solu-Medrol, Protonix,  Dilaudid, Zofran.   PAST MEDICAL HISTORY:  1. Universal ulcerative proctocolitis, diagnosed in March 2008 by      colonoscopy with biopsy.  2. Migraine headache syndrome.  3. Chronic iron-deficiency anemia.   FAMILY HISTORY:  No family history of colitis.   HABITS:  The patient does not smoke cigarettes or consume alcohol.   CHRONIC OUTPATIENT MEDICATIONS:  1. Asacol 1600 mg 3 times daily.  2. Iron sulfate 325 mg twice daily.   LABORATORY DATA:  CBC reveals a  microcytic anemia with hemoglobin 6.1 g.  Complete metabolic profile is normal except for hypokalemia,  sedimentation rate is 90 mm per hour.  Pending stool studies including  C. difficile toxin, stool culture, and stool screen for ova and  parasites.  The patient just completed a CT scan of the abdomen and  pelvis   PHYSICAL EXAMINATION:  GENERAL APPEARANCE:  The patient is alert and  appears comfortable.  She just completed a CT scan of the abdomen and  pelvis and did tolerate the oral Gastrografin.  She reports no further  nausea, vomiting, or abdominal pain.  HEENT:  Nonicteric sclerae.  Normal oropharynx.  The patient denies oral  ulcerations.  LUNGS:  Clear to auscultation.  The patient reports no breathing  difficulty.  CARDIAC:  Regular rhythm without murmurs.  ABDOMEN:  Soft, flat, and nontender.  EXTREMITIES:  No edema.  SKIN:  Clear.   ASSESSMENT:  1. Chronic universal ulcerative proctocolitis with poor response to      oral Asacol.  2. Resolved abdominal pain with vomiting.  Continued bloody diarrhea.  3. Iron-deficiency  anemia.   RECOMMENDATIONS:  1. Continue intravenous Solu-Medrol 40 mg every 12 hours.  2. Await stool studies.  3. Hold antibiotics for now.           ______________________________  Earle Gell, M.D.     MJ/MEDQ  D:  06/03/2009  T:  06/04/2009  Job:  [id number]   cc:   Elyse Jarvis. Amedeo Plenty, M.D.  Tory Emerald Benson Norway, MD

## 2011-04-04 NOTE — Op Note (Signed)
NAMEBRENNAN, Joyce Solomon            ACCOUNT NO.:  [id number]   MEDICAL RECORD NO.:  [id number]          PATIENT TYPE:  INP   LOCATION:  6709                         FACILITY:  [facility]   PHYSICIAN:  Wonda Horner, M.D.   DATE OF BIRTH:  1986-05-23   DATE OF PROCEDURE:  06/08/2009  DATE OF DISCHARGE:                               OPERATIVE REPORT   PROCEDURE:  Flexible sigmoidoscopy with biopsy.   INDICATIONS FOR PROCEDURE:  History of ulcerative colitis.  Recent CT  scan raised the question of a stricture in the sigmoid colon.   Informed consent was obtained after explanation of the risks of  bleeding, infection, and perforation.   PREMEDICATION:  Fentanyl 110 mcg IV, Versed 12 mg IV.   PROCEDURE IN DETAIL:  With the patient in the left lateral decubitus  position, a rectal exam was performed.  No masses were felt.  The Pentax  pediatric colonoscope was inserted into the rectum and advanced into the  sigmoid colon.  It was easily advanced up the sigmoid colon.  There was  no evidence of a stricture.  It was advanced into the descending colon  up into the proximal descending colon region.  The mucosa from rectum to  descending colon was red, erythematous with a continuous appearance of  ulcerative colitis.  There was blood on the mucosal wall which could be  washed away.  There were on multiple scattered petechiae-appearing  lesions.  A couple of biopsies were obtained from the sigmoid.  Findings  are consistent with ulcerative colitis.  She tolerated the procedure  well without complications.   IMPRESSION:  1. Ulcerative colitis.  2. No evidence of a sigmoid stricture.           ______________________________  Wonda Horner, M.D.     SFG/MEDQ  D:  06/08/2009  T:  06/09/2009  Job:  [id number]   cc:   [facility]  Earle Gell, M.D.

## 2011-04-04 NOTE — H&P (Signed)
Joyce Solomon, Joyce Solomon            ACCOUNT NO.:  [id number]   MEDICAL RECORD NO.:  [id number]          PATIENT TYPE:  INP   LOCATION:  2605                         FACILITY:  [facility]   PHYSICIAN:  Reyne Dumas, MD       DATE OF BIRTH:  Aug 08, 1986   DATE OF ADMISSION:  06/03/2009  DATE OF DISCHARGE:                              HISTORY & PHYSICAL   CHIEF COMPLAINT:  Bloody diarrhea.   SUBJECTIVE:  This is a 25 year old female with history of ulcerative  colitis who presents to the ER with a history of bloody diarrhea, weight  loss, nausea and vomiting.  The patient states that she has had  8 to 12  bowel movements per day over the last several months, progressively  worse over the last couple of weeks. She also has been feeling dizzy and  lightheaded, has had some nausea.  She denies fever, chills or rigors.  She denies any urgency frequency ,dysuria. The patient presents to the  ER and was found to be quite anemic/hypokalemic.  Hemoglobin is down  significantly from her baseline of about 10 to 6.1.   PAST MEDICAL HISTORY:  1. Ulcerative colitis.  2. Iron deficiency anemia.  3. Migraine headaches.   FAMILY HISTORY:  Patient's siblings are healthy. She has a grandmother  who has diabetes and peptic ulcer disease.   SOCIAL HISTORY:  She is a life-long nonsmoker, denies any use of  alcohol.  Is unemployed.   ALLERGIES:  No known drug allergies.   CURRENT MEDICATIONS:  1. Asacol 400 mg p.o. b.i.d.  2. Anusol suppository twice a day.   REVIEW OF SYSTEMS:  As documented in HPI.   PHYSICAL EXAMINATION:  VITAL SIGNS:  Blood pressure 112/70, respirations  16, temperature 97.9.  GENERAL:  The patient appears to be quite thin and wasted, currently no  acute distress.  HEENT:  Normocephalic, atraumatic.  Pupil equal and reactive to light.  Oropharynx dry.  No oropharyngeal ulcers noted.  NECK:  Supple.  No lymphadenopathy.  No JVD.  LUNGS:  Clear to auscultation bilaterally.  No  crackles or rhonchi.  HEART:  Regular rate and rhythm.  No murmurs, rubs or gallops.  ABDOMEN:  Mildly distended and diffusely tender to palpation.  No  rebound or guarding.  NEUROLOGICAL:  Cranial nerves II-XII grossly intact.  SKIN:  [place], no rashes.  LYMPH NODES:  No palpable lymphadenopathy.   LABORATORY DATA:  Abdominal film showed mild diffuse gaseous distention  of the small and the large bowel with non-sepsis, nonobstructive bowel  gas pattern.  BMP:  Sodium 136, potassium 3.2, chloride 113, bicarbonate  24, glucose 114, BUN 4, creatinine 0.6, AST 19, ALT 12, calcium 8.9.  WBC 11.3, hemoglobin 6.1, hematocrit 94, platelets 666, lipase 19.  Urinalysis negative.  Pregnancy test is negative.   ASSESSMENT/PLAN:  1. GI bleeding, likely lower GI secondary to history of ulcerative      colitis with an acute flare and underlying infectious colitis      cannot be ruled out.  Will obtain stool studies with C. Difficile,      ova and  parasites, cultures and sensitivity.  Will start the      patient on IV Solu-Medrol and also start the patient on Cipro and      Flagyl.  The patient will need to have GI consultation in the      morning.  She currently does not have a gastroenterologist.  2. Will also check an ESR and a CRP.  3. Anemia.  This is likely secondary to her blood loss which has been      going on for awhile.  We will transfuse the patient with two units      of packed red blood cells.  If the hemoglobin does not correct      appropriately, will transfuse two more units.  Will obtain a CT      scan of her abdomen and pelvis with IV contrast.  4. Hypokalemia.  Will start the patient on IV hydration and replete      her potassium.  She is a full code.      Reyne Dumas, MD  Electronically Signed     NA/MEDQ  D:  06/03/2009  T:  06/03/2009  Job:  [id number]

## 2011-04-04 NOTE — H&P (Signed)
Joyce Solomon, Joyce Solomon            ACCOUNT NO.:  [id number]   MEDICAL RECORD NO.:  [id number]          PATIENT TYPE:  INP   LOCATION:  1321                         FACILITY:  [facility]   PHYSICIAN:  Jacquelynn Cree, M.D.   DATE OF BIRTH:  07-Sep-1986   DATE OF ADMISSION:  12/06/2007  DATE OF DISCHARGE:                              HISTORY & PHYSICAL   PRIMARY CARE PHYSICIAN:  Dr. Tomma Lightning at [facility].   HISTORY OF PRESENT ILLNESS:  The patient is a 25 year old female with a  past medical history of ulcerative colitis diagnosed in June 2007.  This  was biopsy confirmed with colonoscopy.  She has had a history of bloody  stool requiring blood transfusion and now presents with a 3-day history  of frank hematochezia.  She presented to a health care Emmamae Mcnamara at the  walk-in clinic today and had her hemoglobin checked and it was found to  be 7  and therefore the patient was referred to the emergency department  for blood transfusion.  She apparently has seen Dr. Benson Norway in the past but  has been dismissed from his practice.  Nevertheless, he was contacted  and did recommend that the patient be admitted and given blood  transfusion.  The patient is asymptomatic with the exception of some  intermittent abdominal pain and occasional dizziness.   PAST MEDICAL HISTORY:  1. Ulcerative colitis.  2. Iron deficiency anemia.  3. Migraine headaches.   FAMILY HISTORY:  The patient's parents and siblings are healthy.  A  paternal grandmother has diabetes and peptic ulcer disease.   SOCIAL HISTORY:  The patient is single and currently lives with her  mother.  She is a lifelong nonsmoker.  She denies alcohol or drug use.  She is unemployed.   ALLERGIES:  None.   CURRENT MEDICATIONS:  1. Prednisone 10 mg daily.  2. Asacol unknown dosage t.i.d.  3. Oxycodone p.r.n.   REVIEW OF SYSTEMS:  The patient does report diminished appetite.  She  denies any nausea or vomiting.  She denies chest pain, shortness  of  breath or cough.  She denies fever or chills.  Her last menstrual period  was beginning of December.   PHYSICAL EXAMINATION:  VITAL SIGNS:  Temperature 98.3, pulse 86,  respirations 18, blood pressure 116/83, O2 saturation 93% on room air.  GENERAL:  A thin black female in no acute distress.  HEENT:  Normocephalic, atraumatic.  PERRL.  EOMI.  Oropharynx clear.  NECK:  Supple.  No thyromegaly, no lymphadenopathy, no jugular venous  distention.  CHEST:  Lungs clear to auscultation bilaterally with good air movement.  HEART:  Regular rate, rhythm.  No murmurs, rubs, gallops.  ABDOMEN:  Soft, nontender, nondistended.  She does have some hyperactive bowel  sounds.  EXTREMITIES:  No clubbing, edema, cyanosis.  SKIN:  Warm and dry.  No rashes.  NEUROLOGIC:  The patient is alert and oriented x3.  Nonfocal.   DATA REVIEW:  Laboratory data:  White blood cell count is 9.4,  hemoglobin 7.1, hematocrit 23, platelets 286.   ASSESSMENT/PLAN:  1. Ulcerative colitis with acute flare and hematochezia with  acute      blood loss anemia:  We will admit the patient and transfuse 2 units      of packed red blood cells.  Would check a C. difficile toxin as      well.  Will check her electrolytes to make sure she does not have      any electrolyte imbalance given the bloody stools.  Will start her      on Solu-Medrol 20 mg IV q.12 h and Asacol 1600 mg p.o. t.i.d.      Consider a GI consultation if the flare does not reverse with these      measures.  Also put her on iron supplements.  2. Prophylaxis:  Start the patient on p.o. Protonix for GI      prophylaxis.  She is a young ambulatory female with acute      contraindication for DVT prophylaxis with any anticoagulation      therapy.      Jacquelynn Cree, M.D.  Electronically Signed     CR/MEDQ  D:  12/06/2007  T:  12/07/2007  Job:  [id number]

## 2011-04-07 NOTE — H&P (Signed)
Joyce Solomon, Joyce Solomon            ACCOUNT NO.:  [id number]   MEDICAL RECORD NO.:  [id number]          PATIENT TYPE:  INP   LOCATION:  0109                         FACILITY:  [facility]   PHYSICIAN:  Tory Emerald. Benson Norway, MD    DATE OF BIRTH:  1985/12/13   DATE OF ADMISSION:  01/15/2007  DATE OF DISCHARGE:                              HISTORY & PHYSICAL   REFERRING PHYSICIAN:  Dr. Mat Carne.   REASON FOR ADMISSION:  Severe anemia, progressive weakness and  dehydration.   HISTORY OF PRESENT ILLNESS:  This is a 25 year old female who was  initially evaluated by me in the office on January 14, 2007, with  progressive weakness over the past several months and intermittent  hematochezia.  The patient states that ongoing hematochezia started  several months ago.  However, she did not seek any further medical care  until she started to feel weak.  She subsequently presented to the  urgent care clinic and was referred for further evaluation from a GI  standpoint.  She was noted to have a microcytic anemia with a hemoglobin  of 7.8 with her complaints of hematochezia.  While in the office,  evaluation was conducted, and her history was confirmed from the urgent  care clinic.  Rectal exam did reveal heme-positive stool.  At that time  of the evaluation in the office, the patient was noted to be mildly  tachycardiac on physical examination.  However, blood pressure was  stable.  Arrangements were made for her to receive a blood transfusion  on January 15, 2007, prior to undergoing a colonoscopy.  Unfortunately,  during the typing process for her blood, she was noted to have an  antibody and was unable to receive any blood transfusions.  A hematology  consult was recommended.  Unfortunately, the patient continued to feel  weak and presented to the emergency room for further evaluation and  treatment.  While in the emergency room, she did receive a total of 3  liters of normal saline and  subsequently did feel better.  However, she  remains tachycardiac with heart rate ranging at 125, and her hemoglobin  has not equilibrated to approximately 5.1.   PAST MEDICAL AND SURGICAL HISTORY:  None.   FAMILY HISTORY:  Noncontributory.   SOCIAL HISTORY:  Negative for alcohol, tobacco or illicit drug use.   ALLERGIES:  No known drug allergies.   REVIEW OF SYSTEMS:  Significant for fatigue and weakness.  No chest pain  or shortness of breath.  Positive for weight loss of approximately 10  pounds.  Decreased appetite.  Positive for abdominal pain.  No skin  rashes.  No arthritis.  No new neurologic complaints.   PHYSICAL EXAMINATION:  VITAL SIGNS:  Blood pressure 96/43, heart rate  109, temperature 97.5, pulse ox 100% on room air.  GENERAL:  The patient appears be weak appearing but is alert and  oriented.  HEENT: Normocephalic, atraumatic.  Extraocular muscles intact.  Pupils  equal, round and reactive to light.  NECK:  Supple.  No lymphadenopathy.  LUNGS:  Clear to auscultation bilaterally.  CARDIOVASCULAR:  Regular rate and  rhythm.  ABDOMEN:  Flat and soft.  There is some mild tenderness diffusely.  EXTREMITIES:  No clubbing, cyanosis or edema.   LABORATORY VALUES:  White blood cell count 9.8, hemoglobin 5.1,  platelets 367,000.  Sodium 126, potassium 3.3, chloride 96, CO2 21, BUN  13, creatinine 0.6, glucose 139, AST 15, ALT 13, alkaline phosphatase  60, total bilirubin 0.7 and albumin 3.7.   IMPRESSIONS:  1. Probable inflammatory bowel disease.  2. 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  3. Blood typing difficulties.   Unfortunately, further evaluation could not be conducted at this time  with a colonoscopy as her blood count is markedly low.  Further  evaluation and treatment for these antibodies will be required at this  time.   PLAN:  1. Obtain hematology consult.  2. Continue supportive care.      Tory Emerald Benson Norway, MD  Electronically  Signed     PDH/MEDQ  D:  01/16/2007  T:  01/16/2007  Job:  [id number]   cc:   Mat Carne, M.D.

## 2011-04-07 NOTE — Discharge Summary (Signed)
NAMEMALEAH, Joyce Solomon            ACCOUNT NO.:  [id number]   MEDICAL RECORD NO.:  [id number]          PATIENT TYPE:  INP   LOCATION:  [place]                         FACILITY:  [facility]   PHYSICIAN:  Tory Emerald. Benson Norway, MD    DATE OF BIRTH:  10-30-1986   DATE OF ADMISSION:  01/15/2007  DATE OF DISCHARGE:                               DISCHARGE SUMMARY   ADMISSION DIAGNOSIS:  1. Severe anemia.  2. Hematochezia.  3. Diarrhea.  4. Abdominal pain.   DISCHARGE DIAGNOSIS:  1. Inflammatory bowel disease, presumably ulcerative colitis.   REFERRING PHYSICIAN:  __________  at [facility].   HISTORY OF PRESENT ILLNESS:  Please see the original H&P for full  details.   HOSPITAL COURSE:  The patient was admitted to the hospital with  complaints of fatigue and weakness.  Her hemoglobin was noted to be at  5.1 on admission.  There was difficulty with a type-and-crossmatch for  her; and he was recommended that a hematology consult be obtained;  however, the blood bank was able to type-and-crossmatch her blood; and  she was subsequently transfused 4 units of packed red blood cells.  She  increased her hemoglobin from 5.2 to 12.5, and subsequently felt better.  Also she received IV hydration in the meantime.  After she was able to  be transfused and felt much more improved, she was removed from the step-  down unit, and subsequently monitored on the tele unit.   She had also underwent a colonoscopy on hospital day #3,  and this  revealed a severe ascending colitis from the anus to the hepatic  flexure.  The ascending colon, cecum, and terminal ileum were negative  for any abnormalities.  Multiple biopsies were obtained in the area as  well as evaluation for C. difficile colitis.  The biopsies revealed a  mild chronically active colitis consistent with inflammatory bowel  disease; and there is no evidence of C. difficile infection.   She was subsequently started on Solu-Medrol 20 mg IV  q.12 h. as well as  Asacol 1600 mg t.i.d.  Over the intervening days she continued to  improve.  She was noted to be low on her potassium; subsequently she was  repleted during her hospitalization.  On the day of discharge she  reports that she only had possibly 2 bowel movements on the preceding  day.  Her abdominal pain is much more improved; and she has not required  any significant pain medications.  Additionally, her potassium remained  stable with a discharge value of 3.9; and finally she also reports a  decrease in the amount of blood in her stool.  The patient is much  improved from her admission state.   PLAN:  1. At this time is of for the patient to followup with Dr. Benson Norway in the      office in 1 week.  2. She will be discharged with prednisone 40 mg p.o. daily; and she is      to come my the office to pick up Asacol before she receives her      Medicaid.  Tory Emerald Benson Norway, MD  Electronically Signed     PDH/MEDQ  D:  01/23/2007  T:  01/23/2007  Job:  [id number]   cc:   Dr. ___________

## 2011-04-07 NOTE — Discharge Summary (Signed)
Joyce Solomon, Joyce Solomon NO.:  [id number]   MEDICAL RECORD NO.:  [id number]          PATIENT TYPE:  INP   LOCATION:  6707                         FACILITY:  [facility]   PHYSICIAN:  Wonda Horner, M.D.   DATE OF BIRTH:  Jul 29, 1986   DATE OF ADMISSION:  06/03/2009  DATE OF DISCHARGE:  06/10/2009                               DISCHARGE SUMMARY   REASON FOR ADMISSION:  The patient is a 26 year old female who was  admitted to the Hospitalist Service with a flare-up of ulcerative  colitis.  GI was consulted as an unassigned patient for further  evaluation and treatment.   The patient was diagnosed with universal ulcerative colitis in the past  by Dr. Carol Ada.  She had been on Asacol but was not doing well.  She states that she has never really been under control with Asacol.  She had been on steroids in the past.   PHYSICAL EXAMINATION:  See H and P on chart.   HOSPITAL COURSE:  The patient was seen in consultation by Dr. Wynetta Emery.  She was started on Solu-Medrol.  Her symptoms gradually improved.  She  underwent a flexible sigmoidoscopy on June 08, 2009, because a CT scan  raised the question of a stricture in the lower colon sigmoid region.  The sigmoidoscopy was compatible with ulcerative colitis, and there was  no evidence of a stricture.  Biopsies were compatible with chronic  active mucosal colitis compatible with ulcerative colitis.  She  continued to improve during her hospitalization.  She was switched to  Lialda and prednisone and she was subsequently discharged on Lialda 4  pills p.o. daily and prednisone 20 mg p.o. daily.  She was instructed to  follow up with [facility] and was told to follow up also with myself.   Diet as tolerated.   Activity as tolerated.   FINAL DIAGNOSIS:  Ulcerative colitis.           ______________________________  Wonda Horner, M.D.     SFG/MEDQ  D:  07/08/2009  T:  07/08/2009  Job:  [id number]

## 2011-04-15 ENCOUNTER — Inpatient Hospital Stay (HOSPITAL_COMMUNITY)
Admission: RE | Admit: 2011-04-15 | Discharge: 2011-04-15 | Disposition: A | Discharge status: Home / Self Care | Payer: Self-pay | Source: Ambulatory Visit | Attending: Obstetrics and Gynecology | Admitting: Obstetrics and Gynecology

## 2011-04-15 DIAGNOSIS — O99891 Other specified diseases and conditions complicating pregnancy: Secondary | ICD-10-CM | POA: Insufficient documentation

## 2011-04-15 DIAGNOSIS — R109 Unspecified abdominal pain: Secondary | ICD-10-CM | POA: Insufficient documentation

## 2011-04-15 DIAGNOSIS — O9989 Other specified diseases and conditions complicating pregnancy, childbirth and the puerperium: Secondary | ICD-10-CM

## 2011-04-15 LAB — URINALYSIS, ROUTINE W REFLEX MICROSCOPIC
Bilirubin Urine: NEGATIVE
Nitrite: NEGATIVE
Specific Gravity, Urine: 1.02 (ref 1.005–1.030)
Urobilinogen, UA: 0.2 mg/dL (ref 0.0–1.0)

## 2011-04-15 LAB — CBC
MCH: 28.3 pg (ref 26.0–34.0)
MCHC: 33.3 g/dL (ref 30.0–36.0)
Platelets: 298 10*3/uL (ref 150–400)
RBC: 4.46 MIL/uL (ref 3.87–5.11)

## 2011-04-15 LAB — WET PREP, GENITAL: Yeast Wet Prep HPF POC: NONE SEEN

## 2011-06-18 ENCOUNTER — Inpatient Hospital Stay (HOSPITAL_COMMUNITY)
Admission: AD | Admit: 2011-06-18 | Discharge: 2011-06-18 | Disposition: A | Discharge status: Home / Self Care | Payer: Medicaid Other | Source: Ambulatory Visit | Attending: Obstetrics and Gynecology | Admitting: Obstetrics and Gynecology

## 2011-06-18 ENCOUNTER — Encounter (HOSPITAL_COMMUNITY): Payer: Self-pay | Admitting: *Deleted

## 2011-06-18 DIAGNOSIS — O9989 Other specified diseases and conditions complicating pregnancy, childbirth and the puerperium: Secondary | ICD-10-CM | POA: Insufficient documentation

## 2011-06-18 DIAGNOSIS — O210 Mild hyperemesis gravidarum: Secondary | ICD-10-CM | POA: Insufficient documentation

## 2011-06-18 DIAGNOSIS — K529 Noninfective gastroenteritis and colitis, unspecified: Secondary | ICD-10-CM

## 2011-06-18 DIAGNOSIS — R1013 Epigastric pain: Secondary | ICD-10-CM | POA: Insufficient documentation

## 2011-06-18 DIAGNOSIS — K5289 Other specified noninfective gastroenteritis and colitis: Secondary | ICD-10-CM

## 2011-06-18 HISTORY — DX: Anemia, unspecified: D64.9

## 2011-06-18 HISTORY — DX: Ulcerative colitis, unspecified, without complications: K51.90

## 2011-06-18 LAB — URINALYSIS, ROUTINE W REFLEX MICROSCOPIC
Glucose, UA: NEGATIVE mg/dL
Hgb urine dipstick: NEGATIVE
Specific Gravity, Urine: >1.03 — ABNORMAL HIGH (ref 1.005–1.030)

## 2011-06-18 LAB — CBC
HCT: 39.7 % (ref 36.0–46.0)
Hemoglobin: 13.9 g/dL (ref 12.0–15.0)
MCV: 90.4 fL (ref 78.0–100.0)
RBC: 4.39 MIL/uL (ref 3.87–5.11)
WBC: 13.7 10*3/uL — ABNORMAL HIGH (ref 4.0–10.5)

## 2011-06-18 LAB — COMPREHENSIVE METABOLIC PANEL
AST: 14 U/L (ref 0–37)
BUN: 6 mg/dL (ref 6–23)
CO2: 24 mEq/L (ref 19–32)
Chloride: 103 mEq/L (ref 96–112)
Creatinine, Ser: <0.47 mg/dL — ABNORMAL LOW (ref 0.50–1.10)
Total Bilirubin: 0.2 mg/dL — ABNORMAL LOW (ref 0.3–1.2)

## 2011-06-18 LAB — LIPASE, BLOOD: Lipase: 33 U/L (ref 11–59)

## 2011-06-18 LAB — AMYLASE: Amylase: 77 U/L (ref 0–105)

## 2011-06-18 MED ORDER — IBUPROFEN 600 MG PO TABS
600.0000 mg | ORAL_TABLET | Freq: Once | ORAL | Status: AC
  Administered 2011-06-18: 600 mg via ORAL
  Filled 2011-06-18 (×2): qty 1

## 2011-06-18 MED ORDER — ONDANSETRON HCL 4 MG PO TABS
4.0000 mg | ORAL_TABLET | Freq: Once | ORAL | Status: AC
  Administered 2011-06-18: 4 mg via ORAL
  Filled 2011-06-18 (×2): qty 1

## 2011-06-18 MED ORDER — GI COCKTAIL ~~LOC~~
30.0000 mL | Freq: Once | ORAL | Status: AC
  Administered 2011-06-18: 30 mL via ORAL
  Filled 2011-06-18: qty 30

## 2011-06-18 NOTE — ED Notes (Signed)
Pt states GI cocktail helped but still having cramping upper abd. Request pain med.

## 2011-06-18 NOTE — ED Notes (Signed)
Pt up to BR and ccu sent for u/a

## 2011-06-18 NOTE — ED Provider Notes (Signed)
Joyce Solomon is a 25 y.o. female at 20.1 weeks presenting for sudden onset of epigastric pain and vomiting x 2 early this morning. She denies N/D/C, fever, chills , UC's, VB or LOF. She has a Hx of Ulcerative Colitis, but states that this is not similar pain.  Maternal Medical History:  Contractions: Frequency: irregular.    Prenatal Complications - Diabetes: none.    OB History    Grav Para Term Preterm Abortions TAB SAB Ect Mult Living   1              Medical Hx: Ulcerative colitis, hyperthyroidism, anemia, blood transfusion, antibody pos (after transfusion) No past surgical history on file. Family History: family history is not on file. Social History:  does not have a smoking history on file. She does not have any smokeless tobacco history on file. Her alcohol and drug histories not on file.  Review of Systems  Constitutional: Negative.   Eyes: Negative for blurred vision.  Gastrointestinal: Positive for vomiting and abdominal pain. Negative for diarrhea, constipation and blood in stool.  Musculoskeletal: Negative for back pain.  Neurological: Negative for headaches.     Blood pressure 104/67, pulse 79, temperature 97.9 F (36.6 C), temperature source Oral, resp. rate 20, height 4' 11"  (1.499 m), weight 60.601 kg (133 lb 9.6 oz). Maternal Exam:  Uterine Assessment: none  Abdomen: Fundal height is S=D.       Fetal Exam Fetal Monitor Review: Mode: hand-held doppler probe.   Baseline rate: 142.      Physical Exam  Constitutional: She is oriented to person, place, and time. She appears well-developed. She appears distressed (uncomfortable-appearing).  Cardiovascular: Normal rate.   Respiratory: Effort normal. She exhibits no tenderness.  GI: Bowel sounds are normal. She exhibits distension. She exhibits no mass. There is tenderness. There is guarding. There is no rebound.  Neurological: She is alert and oriented to person, place, and time.  Skin: Skin is warm  and dry.    Prenatal labs: ABO, Rh:  B pos Antibody:   Rubella:   RPR: NON REACTIVE (04/23 1559)  HBsAg:    HIV:    GBS:     Assessment/Plan: Assessment: Epigastric pain and vomiting of unknown etiology   Plan: CBC, CMET, Amylase, Lipase, GI cocktail per consult w/ Dr. Viann Shove, Aalyah Mansouri 06/18/2011, 6:36 AM

## 2011-06-18 NOTE — Progress Notes (Signed)
Marlou Porch CNM notified of pt's admission and status. Pt unable to void currently for u/a. Will see pt

## 2011-06-18 NOTE — ED Provider Notes (Signed)
History     Chief Complaint  Patient presents with  . Abdominal Pain   HPI   ROS Physical Exam   Blood pressure 104/67, pulse 79, temperature 97.9 F (36.6 C), temperature source Oral, resp. rate 20, height 4' 11"  (1.499 m), weight 133 lb 9.6 oz (60.601 kg).  Physical Exam  MAU Course  Procedures   Assessment and Plan  Pt now states her pain is down to a "2".  Dr Willis Modena updated. He ordered pt to be discharged home.    [facility] 06/18/2011, 9:28 AM

## 2011-06-18 NOTE — Progress Notes (Signed)
G1 at 20.2wks. Awoke and was getting up to BR. Having pain in mid to upper abd. Cramping. Vomited x 2. Denies diarrhea. No bleeding or d/c

## 2011-06-18 NOTE — ED Provider Notes (Signed)
Pt reports improvement in pain, now 5/10 w/ GI cocktail. Awaiting la results.

## 2011-06-18 NOTE — Progress Notes (Signed)
[id number] V. Smith CNM in to see pt. EFM strip reviewed.

## 2011-07-17 ENCOUNTER — Inpatient Hospital Stay (HOSPITAL_COMMUNITY)
Admission: AD | Admit: 2011-07-17 | Discharge: 2011-07-17 | Disposition: A | Discharge status: Home / Self Care | Payer: Medicaid Other | Source: Ambulatory Visit | Attending: Obstetrics and Gynecology | Admitting: Obstetrics and Gynecology

## 2011-07-17 ENCOUNTER — Encounter (HOSPITAL_COMMUNITY): Payer: Self-pay | Admitting: *Deleted

## 2011-07-17 DIAGNOSIS — K5190 Ulcerative colitis, unspecified, without complications: Secondary | ICD-10-CM | POA: Insufficient documentation

## 2011-07-17 DIAGNOSIS — O200 Threatened abortion: Secondary | ICD-10-CM | POA: Insufficient documentation

## 2011-07-17 DIAGNOSIS — O99891 Other specified diseases and conditions complicating pregnancy: Secondary | ICD-10-CM | POA: Insufficient documentation

## 2011-07-17 DIAGNOSIS — K5900 Constipation, unspecified: Secondary | ICD-10-CM | POA: Insufficient documentation

## 2011-07-17 DIAGNOSIS — Z79899 Other long term (current) drug therapy: Secondary | ICD-10-CM | POA: Insufficient documentation

## 2011-07-17 LAB — URINALYSIS, ROUTINE W REFLEX MICROSCOPIC
Bilirubin Urine: NEGATIVE
Glucose, UA: NEGATIVE mg/dL
Hgb urine dipstick: NEGATIVE
Ketones, ur: 15 mg/dL — AB
Specific Gravity, Urine: 1.02 (ref 1.005–1.030)
pH: 6 (ref 5.0–8.0)

## 2011-07-17 MED ORDER — NIFEDIPINE 10 MG PO CAPS
10.0000 mg | ORAL_CAPSULE | Freq: Once | ORAL | Status: AC
  Administered 2011-07-17: 10 mg via ORAL
  Filled 2011-07-17: qty 1

## 2011-07-17 MED ORDER — LACTATED RINGERS IV BOLUS (SEPSIS)
1000.0000 mL | Freq: Once | INTRAVENOUS | Status: AC
  Administered 2011-07-17: 1000 mL via INTRAVENOUS

## 2011-07-17 NOTE — Progress Notes (Signed)
SAYS HER  LOWER ABD PAIN   STARTED ON Sunday  AFTER EATING 3 HOTDOGS  AND FISH. - NO BM.  [name] SHE TOOK TYLENOL AT YESTERDAY  AT5PM- NO RELIEF.  PAIN IS NOW SAME AS YESTERDAY.

## 2011-07-17 NOTE — Progress Notes (Signed)
Stomach has hurt since yesterday when she ate 3 hot dogs, lower abd pain, unknown if UC

## 2011-07-17 NOTE — ED Provider Notes (Signed)
History   In with c/o abd pain since yesterday after eating 3 hotdogs. Also states has not had BM in a week and has trouble with constipation.  Chief Complaint  Patient presents with  . Abdominal Pain   HPI  OB History    Grav Para Term Preterm Abortions TAB SAB Ect Mult Living   1         0      Past Medical History  Diagnosis Date  . Anemia   . Hyperthyroidism   . Chronic ulcerative colitis   . Blood transfusion     Past Surgical History  Procedure Date  . Colon biopsy     No family history on file.  History  Substance Use Topics  . Smoking status: Former Smoker -- 3 years    Types: Cigarettes    Quit date: 05/16/2010  . Smokeless tobacco: Not on file  . Alcohol Use: No    Allergies:  Allergies  Allergen Reactions  . Latex Rash    Prescriptions prior to admission  Medication Sig Dispense Refill  . acetaminophen (TYLENOL) 500 MG tablet Take 500 mg by mouth every 6 (six) hours as needed.        . mesalamine (LIALDA) 1.2 G EC tablet Take 1,200 mg by mouth daily with breakfast.        . prenatal vitamin w/FE, FA (PRENATAL 1 + 1) 27-1 MG TABS Take 1 tablet by mouth daily.        . mesalamine (ASACOL) 400 MG EC tablet Take 400 mg by mouth 4 (four) times daily.          Review of Systems  Constitutional: Negative.   HENT: Negative.   Eyes: Negative.   Respiratory: Negative.   Cardiovascular: Negative.   Gastrointestinal: Positive for constipation.  Genitourinary: Negative.   Musculoskeletal: Negative.   Skin: Negative.   Neurological: Negative.   Endo/Heme/Allergies: Negative.   Psychiatric/Behavioral: Negative.    Physical Exam   Blood pressure 127/76, pulse 83, temperature 98.7 F (37.1 C), temperature source Oral, resp. rate 20, height 4' 9"  (1.448 m), weight 59.988 kg (132 lb 4 oz).  Physical Exam  Constitutional: She is oriented to person, place, and time. She appears well-developed and well-nourished.  HENT:  Head: Normocephalic.  Eyes:  Pupils are equal, round, and reactive to light.  Neck: Normal range of motion.  Cardiovascular: Normal rate, regular rhythm and normal heart sounds.   Respiratory: Effort normal and breath sounds normal.  Genitourinary: Vagina normal.  Musculoskeletal: Normal range of motion.  Neurological: She is alert and oriented to person, place, and time. She has normal reflexes.  Skin: Skin is warm and dry.  Psychiatric: She has a normal mood and affect.    MAU Course  Procedures  MDM preg at  24 weeks mild uc's q 2-4 min apart.   Assessment and Plan  Will Call and discusss POC with Dr. Lauraine Rinne, Marquese Burkland 07/17/2011, 9:40 PM

## 2011-07-18 ENCOUNTER — Inpatient Hospital Stay (HOSPITAL_COMMUNITY)
Admission: AD | Admit: 2011-07-18 | Discharge: 2011-07-19 | Disposition: A | Discharge status: Home / Self Care | Payer: Medicaid Other | Source: Ambulatory Visit | Attending: Obstetrics and Gynecology | Admitting: Obstetrics and Gynecology

## 2011-07-18 DIAGNOSIS — O26899 Other specified pregnancy related conditions, unspecified trimester: Secondary | ICD-10-CM

## 2011-07-18 DIAGNOSIS — R51 Headache: Secondary | ICD-10-CM | POA: Insufficient documentation

## 2011-07-18 NOTE — Progress Notes (Signed)
Pt states, " I've had a headache on both sides of my temples for four hours. I've taken tylenol without relief."

## 2011-07-18 NOTE — Progress Notes (Signed)
Pt complaints of headache that she has had for 4 hours and it hasn't gone away

## 2011-07-19 LAB — URINALYSIS, ROUTINE W REFLEX MICROSCOPIC
Glucose, UA: NEGATIVE mg/dL
Hgb urine dipstick: NEGATIVE
Leukocytes, UA: NEGATIVE
Protein, ur: NEGATIVE mg/dL
Specific Gravity, Urine: 1.02 (ref 1.005–1.030)
Urobilinogen, UA: 0.2 mg/dL (ref 0.0–1.0)

## 2011-07-19 MED ORDER — OXYCODONE HCL 5 MG PO TABS
5.0000 mg | ORAL_TABLET | Freq: Once | ORAL | Status: AC
  Administered 2011-07-19: 5 mg via ORAL
  Filled 2011-07-19 (×2): qty 1

## 2011-07-19 MED ORDER — PRENATAL RX 60-1 MG PO TABS: 1.0000 | ORAL_TABLET | Freq: Every day | ORAL | Status: DC

## 2011-07-19 MED ORDER — HYDROCODONE-IBUPROFEN 7.5-200 MG PO TABS: 1.0000 | ORAL_TABLET | Freq: Once | ORAL | Status: DC

## 2011-07-19 NOTE — ED Provider Notes (Signed)
History     Chief Complaint  Patient presents with  . Headache   HPI Joyce Solomon 24 y.o. comes in tonight with a headache since 8 pm.  Has taken 5 tablets of extra strength Tylenol since 8 pm.  Still has headache.  Was seen in MAU on 07-17-11 for constipation.  Had a soft stool earlier tonight.  Did not call the office before coming to the hospital.   OB History    Grav Para Term Preterm Abortions TAB SAB Ect Mult Living   1         0      Past Medical History  Diagnosis Date  . Anemia   . Hyperthyroidism   . Chronic ulcerative colitis   . Blood transfusion     Past Surgical History  Procedure Date  . Colon biopsy     No family history on file.  History  Substance Use Topics  . Smoking status: Former Smoker -- 3 years    Types: Cigarettes    Quit date: 05/16/2010  . Smokeless tobacco: Not on file  . Alcohol Use: No    Allergies:  Allergies  Allergen Reactions  . Latex Rash    Prescriptions prior to admission  Medication Sig Dispense Refill  . acetaminophen (TYLENOL) 500 MG tablet Take 500 mg by mouth every 6 (six) hours as needed.        . mesalamine (ASACOL) 400 MG EC tablet Take 400 mg by mouth 4 (four) times daily.        . mesalamine (LIALDA) 1.2 G EC tablet Take 1,200 mg by mouth daily with breakfast.        . prenatal vitamin w/FE, FA (PRENATAL 1 + 1) 27-1 MG TABS Take 1 tablet by mouth daily.          Review of Systems  Gastrointestinal: Negative for nausea and vomiting.  Genitourinary:       No vaginal discharge. No vaginal bleeding. No dysuria.   Neurological: Positive for headaches.   Physical Exam   Blood pressure 131/78, pulse 93, temperature 99 F (37.2 C), temperature source Oral, resp. rate 16, height 4' 9"  (1.448 m), weight 130 lb 2 oz (59.024 kg).  Physical Exam  Nursing note and vitals reviewed. Constitutional: She is oriented to person, place, and time. She appears well-developed and well-nourished.  HENT:  Head:  Normocephalic.  Eyes: EOM are normal.  Neck: Neck supple.  GI:       FHT 140  Musculoskeletal: Normal range of motion.  Neurological: She is alert and oriented to person, place, and time.  Skin: Skin is warm and dry.  Psychiatric: She has a normal mood and affect.    MAU Course  Procedures Results for orders placed during the hospital encounter of 07/18/11 (from the past 24 hour(s))  URINALYSIS, ROUTINE W REFLEX MICROSCOPIC     Status: Abnormal   Collection Time   07/19/11 12:55 AM      Component Value Range   Color, Urine YELLOW  YELLOW    Appearance CLEAR  CLEAR    Specific Gravity, Urine 1.020  1.005 - 1.030    pH 6.5  5.0 - 8.0    Glucose, UA NEGATIVE  NEGATIVE (mg/dL)   Hgb urine dipstick NEGATIVE  NEGATIVE    Bilirubin Urine NEGATIVE  NEGATIVE    Ketones, ur >80 (*) NEGATIVE (mg/dL)   Protein, ur NEGATIVE  NEGATIVE (mg/dL)   Urobilinogen, UA 0.2  0.0 - 1.0 (mg/dL)  Nitrite NEGATIVE  NEGATIVE    Leukocytes, UA NEGATIVE  NEGATIVE     MDM Consult with Dr. Ulanda Edison on plan of care. Oxycodone 7.5 mg PO given  Assessment and Plan  Headache in pregnancy  Plan: Headache down from 10/10 to 6/10, will discharge. Drink at least 8 8-oz glasses of water every day. Take Tylenol only as listed on the bottle Call the office if you continue to have headaches. Eat healthy meals and snacks   BURLESON,TERRI 07/19/2011, 12:13 AM

## 2011-07-29 ENCOUNTER — Inpatient Hospital Stay (INDEPENDENT_AMBULATORY_CARE_PROVIDER_SITE_OTHER)
Admission: RE | Admit: 2011-07-29 | Discharge: 2011-07-29 | Disposition: A | Discharge status: Home / Self Care | Payer: Medicaid Other | Source: Ambulatory Visit | Attending: Family Medicine | Admitting: Family Medicine

## 2011-07-29 DIAGNOSIS — O479 False labor, unspecified: Secondary | ICD-10-CM

## 2011-07-29 LAB — POCT URINALYSIS DIP (DEVICE)
Bilirubin Urine: NEGATIVE
Glucose, UA: NEGATIVE mg/dL
Hgb urine dipstick: NEGATIVE
Nitrite: NEGATIVE
Specific Gravity, Urine: 1.015 (ref 1.005–1.030)
Urobilinogen, UA: 0.2 mg/dL (ref 0.0–1.0)

## 2011-07-29 LAB — WET PREP, GENITAL
Trich, Wet Prep: NONE SEEN
Yeast Wet Prep HPF POC: NONE SEEN

## 2011-07-31 LAB — GC/CHLAMYDIA PROBE AMP, GENITAL: Chlamydia, DNA Probe: NEGATIVE

## 2011-08-03 ENCOUNTER — Inpatient Hospital Stay (HOSPITAL_COMMUNITY)
Admission: AD | Admit: 2011-08-03 | Discharge: 2011-08-03 | Disposition: A | Discharge status: Home / Self Care | Payer: Medicaid Other | Source: Ambulatory Visit | Attending: Obstetrics and Gynecology | Admitting: Obstetrics and Gynecology

## 2011-08-03 ENCOUNTER — Encounter (HOSPITAL_COMMUNITY): Payer: Self-pay

## 2011-08-03 DIAGNOSIS — L299 Pruritus, unspecified: Secondary | ICD-10-CM | POA: Insufficient documentation

## 2011-08-03 DIAGNOSIS — Z3400 Encounter for supervision of normal first pregnancy, unspecified trimester: Secondary | ICD-10-CM

## 2011-08-03 DIAGNOSIS — O9989 Other specified diseases and conditions complicating pregnancy, childbirth and the puerperium: Secondary | ICD-10-CM | POA: Insufficient documentation

## 2011-08-03 DIAGNOSIS — T40605A Adverse effect of unspecified narcotics, initial encounter: Secondary | ICD-10-CM | POA: Insufficient documentation

## 2011-08-03 MED ORDER — DIPHENHYDRAMINE HCL 50 MG/ML IJ SOLN
25.0000 mg | Freq: Once | INTRAMUSCULAR | Status: AC
  Administered 2011-08-03: 25 mg via INTRAMUSCULAR
  Filled 2011-08-03: qty 1

## 2011-08-03 NOTE — ED Provider Notes (Signed)
History   Pt presents today c/o taking 2 percocet tablets. She states she got confused on the instructions and took 2 instead of 1/2. She contacted poison control and was told she could "overdose" and was instructed to come to the MAU. Pt originally took the medication for a toothache. She states she no longer has pain but she does have a lot of itching. She denies hives, fever, loc, abd pain, SOB, chest pain, or any other problems at this time.  Chief Complaint  Patient presents with  . Pruritis   HPI  OB History    Grav Para Term Preterm Abortions TAB SAB Ect Mult Living   1         0      Past Medical History  Diagnosis Date  . Anemia   . Hyperthyroidism   . Chronic ulcerative colitis   . Blood transfusion     Past Surgical History  Procedure Date  . Colon biopsy     No family history on file.  History  Substance Use Topics  . Smoking status: Former Smoker -- 3 years    Types: Cigarettes    Quit date: 05/16/2010  . Smokeless tobacco: Not on file  . Alcohol Use: No    Allergies:  Allergies  Allergen Reactions  . Latex Rash    Prescriptions prior to admission  Medication Sig Dispense Refill  . acetaminophen (TYLENOL) 500 MG tablet Take 500 mg by mouth every 6 (six) hours as needed.        . mesalamine (ASACOL) 400 MG EC tablet Take 400 mg by mouth 4 (four) times daily.        . mesalamine (LIALDA) 1.2 G EC tablet Take 1,200 mg by mouth daily with breakfast.        . Prenatal Vit-Fe Fumarate-FA (PRENATAL MULTIVITAMIN) 60-1 MG tablet Take 1 tablet by mouth daily.  30 tablet  0  . prenatal vitamin w/FE, FA (PRENATAL 1 + 1) 27-1 MG TABS Take 1 tablet by mouth daily.          Review of Systems  Constitutional: Negative for fever.  Eyes: Positive for blurred vision. Negative for double vision.  Respiratory: Negative for cough, hemoptysis, sputum production, shortness of breath and wheezing.   Cardiovascular: Negative for chest pain, palpitations, orthopnea and  claudication.  Gastrointestinal: Negative for nausea, vomiting, abdominal pain, diarrhea and constipation.  Genitourinary: Negative for dysuria, urgency, frequency and hematuria.  Skin: Positive for itching. Negative for rash.  Neurological: Negative for dizziness and headaches.  Psychiatric/Behavioral: Negative for depression and suicidal ideas.   Physical Exam   Blood pressure 136/81, pulse 109, temperature 98.3 F (36.8 C), temperature source Oral, resp. rate 16, height 4' 10"  (1.473 m), weight 135 lb 3.2 oz (61.326 kg), SpO2 98.00%.  Physical Exam  Constitutional: She is oriented to person, place, and time. She appears well-developed and well-nourished. No distress.  HENT:  Head: Normocephalic and atraumatic.  Eyes: EOM are normal. Pupils are equal, round, and reactive to light.  Cardiovascular: Normal rate, regular rhythm and normal heart sounds.  Exam reveals no gallop and no friction rub.   No murmur heard. Respiratory: Effort normal and breath sounds normal. No respiratory distress. She has no wheezes. She has no rales. She exhibits no tenderness.  GI: Soft. She exhibits no distension. There is no tenderness. There is no rebound and no guarding.  Neurological: She is alert and oriented to person, place, and time.  Skin: Skin is warm  and dry. She is not diaphoretic.  Psychiatric: She has a normal mood and affect. Her behavior is normal. Judgment and thought content normal.    MAU Course  Procedures  Discussed pt with Dr. Marvel Plan at length. Pt has f/u scheduled in the office.  Benadryl injection given.  NST reactive for 26wks. No ctx noted.  Assessment and Plan  Pregnancy: no sign of overdose. Pt with some itching controlled by benadryl. She has f/u scheduled. Discussed diet, activity, risks, and precautions.  Alinda Deem. Benjimin Hadden III, DrHSc, MPAS, PA-C  08/03/2011, 7:17 PM   Eliott Nine, PA 08/03/11 1934

## 2011-08-03 NOTE — Progress Notes (Signed)
Pt states she has bilateral upper tooth infections and was given Oxycodone for pain. Instructions told to take 1/2 tablet and she took 2 tablets about one hour ago. Started itching all over. Pain was relieved. Called poison control and was instructed to come to MAU.

## 2011-08-03 NOTE — Progress Notes (Signed)
Pt itching from oxycodon, denies pain or bleeding, +FM

## 2011-08-10 LAB — CROSSMATCH

## 2011-08-10 LAB — COMPREHENSIVE METABOLIC PANEL
ALT: 12
AST: 14
Alkaline Phosphatase: 50
BUN: 6
CO2: 23
CO2: 25
Calcium: 8.9
Calcium: 8.9
Chloride: 105
Creatinine, Ser: 0.58
GFR calc Af Amer: >60
GFR calc Af Amer: >60
GFR calc non Af Amer: >60
GFR calc non Af Amer: >60
Glucose, Bld: 103 — ABNORMAL HIGH
Potassium: 3.8
Sodium: 137

## 2011-08-10 LAB — BASIC METABOLIC PANEL
BUN: 4 — ABNORMAL LOW
Calcium: 8.8
Calcium: 8.8
Chloride: 106
Creatinine, Ser: 0.55
Creatinine, Ser: 0.68
GFR calc Af Amer: >60

## 2011-08-10 LAB — DIFFERENTIAL
Basophils Absolute: 0
Basophils Absolute: 0.1
Basophils Relative: 1
Eosinophils Absolute: 0
Eosinophils Absolute: 0.1
Lymphocytes Relative: 17
Lymphs Abs: 2.6
Monocytes Absolute: 2.3 — ABNORMAL HIGH
Monocytes Absolute: 2.3 — ABNORMAL HIGH
Neutro Abs: 4.3
Neutrophils Relative %: 68

## 2011-08-10 LAB — CBC
HCT: 23 — ABNORMAL LOW
HCT: 31.5 — ABNORMAL LOW
Hemoglobin: 10.1 — ABNORMAL LOW
Hemoglobin: 7.1 — CL
MCHC: 30.6
MCHC: 33
MCV: 72 — ABNORMAL LOW
MCV: 73.6 — ABNORMAL LOW
Platelets: 263
Platelets: 286
RBC: 3.89
RBC: 4.28
RDW: 26.6 — ABNORMAL HIGH
WBC: 11.5 — ABNORMAL HIGH
WBC: 13.5 — ABNORMAL HIGH
WBC: 15.4 — ABNORMAL HIGH

## 2011-08-10 LAB — PROTIME-INR: INR: 1

## 2011-08-10 LAB — APTT: aPTT: 28

## 2011-08-14 LAB — DIFFERENTIAL
Basophils Absolute: 0
Basophils Relative: 0
Eosinophils Absolute: 0.1
Lymphocytes Relative: 29
Monocytes Absolute: 1.8 — ABNORMAL HIGH
Neutrophils Relative %: 52

## 2011-08-14 LAB — COMPREHENSIVE METABOLIC PANEL
ALT: 15
Alkaline Phosphatase: 51
BUN: 4 — ABNORMAL LOW
CO2: 24
GFR calc non Af Amer: >60
Glucose, Bld: 81
Potassium: 3.9
Sodium: 137
Total Bilirubin: 0.5
Total Protein: 7.2

## 2011-08-14 LAB — URINALYSIS, ROUTINE W REFLEX MICROSCOPIC
Bilirubin Urine: NEGATIVE
Glucose, UA: NEGATIVE
Specific Gravity, Urine: 1.028
pH: 5.5

## 2011-08-14 LAB — CBC
HCT: 33.7 — ABNORMAL LOW
Hemoglobin: 10.6 — ABNORMAL LOW
RBC: 4.6
RDW: 26.6 — ABNORMAL HIGH

## 2011-08-18 LAB — BASIC METABOLIC PANEL
BUN: 5 — ABNORMAL LOW
CO2: 24
Calcium: 9
Chloride: 104
Creatinine, Ser: 0.5
GFR calc Af Amer: >60
GFR calc non Af Amer: >60
Glucose, Bld: 94
Potassium: 3.9
Sodium: 134 — ABNORMAL LOW

## 2011-08-18 LAB — URINALYSIS, ROUTINE W REFLEX MICROSCOPIC
Bilirubin Urine: NEGATIVE
Ketones, ur: >80 — AB
Nitrite: NEGATIVE
Specific Gravity, Urine: 1.023
Urobilinogen, UA: 0.2

## 2011-08-18 LAB — DIFFERENTIAL
Basophils Absolute: 0
Basophils Relative: 0
Eosinophils Absolute: 0.6
Eosinophils Relative: 5
Lymphocytes Relative: 25
Lymphs Abs: 3
Monocytes Absolute: 2.3 — ABNORMAL HIGH
Monocytes Relative: 19 — ABNORMAL HIGH
Neutro Abs: 6
Neutrophils Relative %: 51

## 2011-08-18 LAB — CBC
HCT: 30.9 — ABNORMAL LOW
Hemoglobin: 9.7 — ABNORMAL LOW
MCHC: 31.4
MCV: 73.6 — ABNORMAL LOW
Platelets: 296
RBC: 4.2
RDW: 32.2 — ABNORMAL HIGH
WBC: 11.9 — ABNORMAL HIGH

## 2011-08-18 LAB — OCCULT BLOOD X 1 CARD TO LAB, STOOL: Fecal Occult Bld: POSITIVE

## 2011-08-30 LAB — URINALYSIS, ROUTINE W REFLEX MICROSCOPIC
Bilirubin Urine: NEGATIVE
Hgb urine dipstick: NEGATIVE
Ketones, ur: 40 — AB
Specific Gravity, Urine: 1.015
pH: 7.5

## 2011-08-30 LAB — COMPREHENSIVE METABOLIC PANEL
ALT: 11
BUN: 6
CO2: 24
Calcium: 9.3
Creatinine, Ser: 0.67
GFR calc non Af Amer: >60
Glucose, Bld: 95

## 2011-08-30 LAB — DIFFERENTIAL
Eosinophils Absolute: 0.2
Lymphocytes Relative: 28
Lymphs Abs: 3.3
Neutro Abs: 6.3
Neutrophils Relative %: 54

## 2011-08-30 LAB — CBC
HCT: 27.9 — ABNORMAL LOW
Hemoglobin: 8.8 — ABNORMAL LOW
MCHC: 31.4
MCV: 70.9 — ABNORMAL LOW
RBC: 3.94
RDW: 19.4 — ABNORMAL HIGH

## 2011-08-30 LAB — LIPASE, BLOOD: Lipase: 27

## 2011-09-04 ENCOUNTER — Inpatient Hospital Stay (HOSPITAL_COMMUNITY)
Admission: AD | Admit: 2011-09-04 | Discharge: 2011-09-04 | Disposition: A | Discharge status: Home / Self Care | Payer: Medicaid Other | Source: Ambulatory Visit | Attending: Obstetrics and Gynecology | Admitting: Obstetrics and Gynecology

## 2011-09-04 DIAGNOSIS — R109 Unspecified abdominal pain: Secondary | ICD-10-CM | POA: Insufficient documentation

## 2011-09-04 DIAGNOSIS — O9989 Other specified diseases and conditions complicating pregnancy, childbirth and the puerperium: Secondary | ICD-10-CM

## 2011-09-04 DIAGNOSIS — O99891 Other specified diseases and conditions complicating pregnancy: Secondary | ICD-10-CM | POA: Insufficient documentation

## 2011-09-04 DIAGNOSIS — O26899 Other specified pregnancy related conditions, unspecified trimester: Secondary | ICD-10-CM

## 2011-09-04 LAB — URINALYSIS, ROUTINE W REFLEX MICROSCOPIC
Ketones, ur: NEGATIVE mg/dL
Leukocytes, UA: NEGATIVE
Nitrite: NEGATIVE
pH: 6 (ref 5.0–8.0)

## 2011-09-04 LAB — WET PREP, GENITAL: Clue Cells Wet Prep HPF POC: NONE SEEN

## 2011-09-04 NOTE — ED Provider Notes (Signed)
History   The patient is a 25 y.o. year old G1P0 female at 57w2dweeks gestation who presents to MAU reporting low abd pain today, feeling as if she has to void after a few pains, few UC's and malodorous discharge. She reports pos FM and denies LOF, VB, frequency, fever, chills or flank pain.    CSN: [id number] date & time: 09/04/2011  7:16 PM  Chief Complaint  Patient presents with  . Abdominal Pain    (Consider location/radiation/quality/duration/timing/severity/associated sxs/prior treatment) HPI  Past Medical History  Diagnosis Date  . Anemia   . Hyperthyroidism   . Chronic ulcerative colitis   . Blood transfusion     Past Surgical History  Procedure Date  . Colon biopsy     No family history on file.  History  Substance Use Topics  . Smoking status: Former Smoker -- 3 years    Types: Cigarettes    Quit date: 05/16/2010  . Smokeless tobacco: Not on file  . Alcohol Use: No    OB History    Grav Para Term Preterm Abortions TAB SAB Ect Mult Living   1         0      Review of Systems: Otherwise neg  Allergies  Latex  Home Medications  No current outpatient prescriptions on file.  BP 112/83  Pulse 113  Temp 98.5 F (36.9 C)  Resp 18  Ht 4' 11"  (1.499 m)  Wt 60.782 kg (134 lb)  BMI 27.06 kg/m2  SpO2 99%  Physical Exam  Constitutional: She is oriented to person, place, and time. She appears well-developed and well-nourished. No distress.  Cardiovascular: Tachycardia present.   Pulmonary/Chest: Effort normal.  Abdominal: Soft. There is no tenderness.  Genitourinary: Uterus is not tender. There is tenderness around the vagina. No bleeding around the vagina. No vaginal discharge found.       Exam extremely limited by pt intolerance of exam.  Neurological: She is alert and oriented to person, place, and time. Abnormal reflex: No UC's palpated.  Skin: Skin is warm and dry.  Psychiatric: She has a normal mood and affect.   Dilation:  Closed Effacement (%): Thick Exam by:: V.Tiyana Galla,CNM  EFM 140- 150's., mod variability, no accels, no decels Toco: UI, no UC's  Results for orders placed during the hospital encounter of 09/04/11 (from the past 24 hour(s))  URINALYSIS, ROUTINE W REFLEX MICROSCOPIC     Status: Normal   Collection Time   09/04/11  7:38 PM      Component Value Range   Color, Urine YELLOW  YELLOW    Appearance CLEAR  CLEAR    Specific Gravity, Urine 1.015  1.005 - 1.030    pH 6.0  5.0 - 8.0    Glucose, UA NEGATIVE  NEGATIVE (mg/dL)   Hgb urine dipstick NEGATIVE  NEGATIVE    Bilirubin Urine NEGATIVE  NEGATIVE    Ketones, ur NEGATIVE  NEGATIVE (mg/dL)   Protein, ur NEGATIVE  NEGATIVE (mg/dL)   Urobilinogen, UA 0.2  0.0 - 1.0 (mg/dL)   Nitrite NEGATIVE  NEGATIVE    Leukocytes, UA NEGATIVE  NEGATIVE    Wet Prep: Microscopic wet-mount exam shows white blood cells.  ED Course  Procedures (including critical care time)   MDM  Assessment:  1. No evidence of preterm labor, although thorough assessment limited by pt intolerance of exam 2. Normal discomfort of pregnancy 3. FHR category II, reassuring for gestation  Plan: 1. D/C home per consult w/ Dr.  Meisinger 2. PTl Precautions 3. F/U    W/ Dr. Viann Shove, Dezmin Kittelson 09/04/2011 9:07 PM

## 2011-09-04 NOTE — Progress Notes (Signed)
Pt reports she has been having pain in lower abd and vaginal area x 1 month off/on, went away for a while then returned today. Denies fever. States it feels like a bladder infection. Frequency. Reports good fetal movement.

## 2011-09-05 NOTE — Progress Notes (Signed)
FHT from 10-15 reviewed, reactive NST for 31 weeks (10x10), no regular ctx

## 2011-09-12 ENCOUNTER — Inpatient Hospital Stay (HOSPITAL_COMMUNITY)
Admission: AD | Admit: 2011-09-12 | Discharge: 2011-09-12 | Disposition: A | Discharge status: Home / Self Care | Payer: Medicaid Other | Source: Ambulatory Visit | Attending: Obstetrics and Gynecology | Admitting: Obstetrics and Gynecology

## 2011-09-12 ENCOUNTER — Encounter (HOSPITAL_COMMUNITY): Payer: Self-pay | Admitting: *Deleted

## 2011-09-12 DIAGNOSIS — O99891 Other specified diseases and conditions complicating pregnancy: Secondary | ICD-10-CM | POA: Insufficient documentation

## 2011-09-12 DIAGNOSIS — O99619 Diseases of the digestive system complicating pregnancy, unspecified trimester: Secondary | ICD-10-CM

## 2011-09-12 DIAGNOSIS — R109 Unspecified abdominal pain: Secondary | ICD-10-CM | POA: Insufficient documentation

## 2011-09-12 LAB — URINALYSIS, ROUTINE W REFLEX MICROSCOPIC
Hgb urine dipstick: NEGATIVE
Protein, ur: NEGATIVE mg/dL
Urobilinogen, UA: 0.2 mg/dL (ref 0.0–1.0)

## 2011-09-12 MED ORDER — DOCUSATE SODIUM 100 MG PO CAPS: 100.0000 mg | ORAL_CAPSULE | Freq: Every day | ORAL | Status: DC | PRN

## 2011-09-12 MED ORDER — ENEMA DISPOSABLE 19-7 GM/118ML RE ENEM: ENEMA | RECTAL | Status: DC

## 2011-09-12 MED ORDER — POLYETHYLENE GLYCOL 3350 17 G PO PACK: 17.0000 g | PACK | Freq: Every day | ORAL | Status: AC

## 2011-09-12 NOTE — ED Notes (Signed)
Susa Simmonds CNM in room with pt

## 2011-09-12 NOTE — Progress Notes (Signed)
Pt reports having constipation for a couple of weeks, and states that Dr. Marvel Plan told her to take stool softeners, but she doesn't have any. She is c/o generalized abdominal pain today and nausea.

## 2011-09-12 NOTE — Progress Notes (Signed)
PT SAYS  HER ABD HURTS- STARTED  FOR A WHIL- FEEL CONSTIPATED-  LAST BM- TODAY   - SMALL AMT - LITTLE ROUND  STOOL. . NO HEMORR... DENIES UC.

## 2011-09-12 NOTE — ED Provider Notes (Signed)
History     No chief complaint on file.  HPI 25 y.o. G1P0 at 80w3dc/o ongoing constipation x several weeks, having generalized abdominal pain, no contractions, no vaginal bleeding or LOF, + fetal movement. Was told to take stool softeners, but says "I don't have any". States that she was taking "some kind of laxative powder" (? Miralax) but only took it occasionally and it didn't work. States she drinks about 3 glasses of water daily.   OB History    Grav Para Term Preterm Abortions TAB SAB Ect Mult Living   1         0      Past Medical History  Diagnosis Date  . Anemia   . Hyperthyroidism   . Chronic ulcerative colitis   . Blood transfusion     Past Surgical History  Procedure Date  . Colon biopsy     No family history on file.  History  Substance Use Topics  . Smoking status: Former Smoker -- 3 years    Types: Cigarettes    Quit date: 05/16/2010  . Smokeless tobacco: Not on file  . Alcohol Use: No    Allergies:  Allergies  Allergen Reactions  . Latex Rash    Prescriptions prior to admission  Medication Sig Dispense Refill  . acetaminophen (TYLENOL) 500 MG tablet Take 500 mg by mouth every 6 (six) hours as needed. Patient takes for pain      . penicillin v potassium (VEETID) 500 MG tablet Take 500 mg by mouth 4 (four) times daily.        . prenatal vitamin w/FE, FA (PRENATAL 1 + 1) 27-1 MG TABS Take 1 tablet by mouth daily.          Review of Systems  Constitutional: Negative.   Respiratory: Negative.   Cardiovascular: Negative.   Gastrointestinal: Positive for abdominal pain and constipation. Negative for nausea, vomiting and diarrhea.  Genitourinary: Negative for dysuria, urgency, frequency, hematuria and flank pain.       Negative for vaginal bleeding, cramping/contractions  Musculoskeletal: Negative.   Neurological: Negative.   Psychiatric/Behavioral: Negative.    Physical Exam   Blood pressure 106/71, pulse 103, temperature 99.5 F (37.5 C),  temperature source Oral, height 4' 10"  (1.473 m), weight 62.199 kg (137 lb 2 oz).  Physical Exam  Constitutional: She is oriented to person, place, and time. She appears well-developed and well-nourished. No distress.  Cardiovascular: Normal rate.   Respiratory: Effort normal.  GI: Soft. Bowel sounds are normal. She exhibits no distension (c/w dates) and no mass. There is no tenderness. There is no rebound and no guarding.  Musculoskeletal: Normal range of motion.  Neurological: She is alert and oriented to person, place, and time. No cranial nerve deficit.  Skin: Skin is warm and dry.  Psychiatric: She has a normal mood and affect.   EFM reactive, TOCO quiet MAU Course  Procedures    Assessment and Plan  25y.o. G1P0 at 366w3dbd pain - likely r/t ongoing constipation To try fleets enema at home, start colace and miralax Dr. HeUlanda Edisongrees with plan  [facility]0/23/2012, 9:23 PM

## 2011-10-01 ENCOUNTER — Inpatient Hospital Stay (HOSPITAL_COMMUNITY)
Admission: AD | Admit: 2011-10-01 | Discharge: 2011-10-01 | Disposition: A | Discharge status: Home / Self Care | Payer: Medicaid Other | Source: Ambulatory Visit | Attending: Obstetrics and Gynecology | Admitting: Obstetrics and Gynecology

## 2011-10-01 ENCOUNTER — Encounter (HOSPITAL_COMMUNITY): Payer: Self-pay | Admitting: *Deleted

## 2011-10-01 DIAGNOSIS — Z0000 Encounter for general adult medical examination without abnormal findings: Secondary | ICD-10-CM

## 2011-10-01 DIAGNOSIS — O4700 False labor before 37 completed weeks of gestation, unspecified trimester: Secondary | ICD-10-CM | POA: Insufficient documentation

## 2011-10-01 DIAGNOSIS — O479 False labor, unspecified: Secondary | ICD-10-CM

## 2011-10-01 LAB — URINALYSIS, ROUTINE W REFLEX MICROSCOPIC
Glucose, UA: NEGATIVE mg/dL
Hgb urine dipstick: NEGATIVE
Ketones, ur: NEGATIVE mg/dL
Leukocytes, UA: NEGATIVE
Protein, ur: NEGATIVE mg/dL

## 2011-10-01 NOTE — Progress Notes (Signed)
Pt missed her appt last Tues and showed up at office this past Fri and was unable to be seen.  She c/o contractions that she rates a "5" and she wanted to make sure everything was okay.  No dysuria, or bleeding.

## 2011-10-01 NOTE — Progress Notes (Signed)
Patient reports having contractions for a couple of days intermittently, today lower abdominal pain, missed OB appointment yesterday, no vaginal bleeding, a little bit more discharge than usual.

## 2011-10-01 NOTE — ED Provider Notes (Signed)
Chief Complaint:  Contractions   Joyce Solomon is  25 y.o. G1P0 at 16w1dpresents complaining of Contractions .  She states irregular, every 10-15 minutes contractions associated with no vaginal bleeding, intact membranes, along with active fetal movement. She missed her OB/GYN appt this week and "wants to get checked out"  Obstetrical/Gynecological History: Menstrual History: OB History    Grav Para Term Preterm Abortions TAB SAB Ect Mult Living   1         0       No LMP recorded. Patient is pregnant.     Past Medical History: Past Medical History  Diagnosis Date  . Anemia   . Hyperthyroidism   . Chronic ulcerative colitis   . Blood transfusion     Past Surgical History: Past Surgical History  Procedure Date  . Colon biopsy   . Tonsillectomy     Family History: History reviewed. No pertinent family history.  Social History: History  Substance Use Topics  . Smoking status: Former Smoker -- 3 years    Types: Cigarettes    Quit date: 05/16/2010  . Smokeless tobacco: Not on file  . Alcohol Use: No    Allergies:  Allergies  Allergen Reactions  . Latex Rash    Meds:  Prescriptions prior to admission  Medication Sig Dispense Refill  . NIFEdipine (PROCARDIA) 20 MG capsule Take 20 mg by mouth every 6 (six) hours as needed. For contractions       . prenatal vitamin w/FE, FA (PRENATAL 1 + 1) 27-1 MG TABS Take 1 tablet by mouth daily.        .Marland KitchenDISCONTD: Sodium Phosphates (ENEMA DISPOSABLE) 19-7 GM/118ML ENEM Use as directed on the box  1 Bottle  1    Review of Systems - Please refer to the aforementioned patients' reports.     Physical Exam  Blood pressure 110/70, pulse 98, temperature 98.6 F (37 C), temperature source Oral, resp. rate 16, height 4' 11"  (1.499 m), weight 64.229 kg (141 lb 9.6 oz). GENERAL: Well-developed, well-nourished female in no acute distress.  LUNGS: Clear to auscultation bilaterally.  HEART: Regular rate and rhythm. ABDOMEN:  Soft, nontender, nondistended, gravid.  EXTREMITIES: Nontender, no edema, 2+ distal pulses. Cervical Exam: Dilatation 0 cm   Effacement 100%   Station -2   Presentation: cephalic FHT:  Baseline rate 140 bpm   Variability moderate  Accelerations present   Decelerations none Contractions: Every 0 mins   Labs: Recent Results (from the past 24 hour(s))  URINALYSIS, ROUTINE W REFLEX MICROSCOPIC   Collection Time   10/01/11  3:00 PM      Component Value Range   Color, Urine YELLOW  YELLOW    Appearance CLEAR  CLEAR    Specific Gravity, Urine 1.010  1.005 - 1.030    pH 6.5  5.0 - 8.0    Glucose, UA NEGATIVE  NEGATIVE (mg/dL)   Hgb urine dipstick NEGATIVE  NEGATIVE    Bilirubin Urine NEGATIVE  NEGATIVE    Ketones, ur NEGATIVE  NEGATIVE (mg/dL)   Protein, ur NEGATIVE  NEGATIVE (mg/dL)   Urobilinogen, UA 0.2  0.0 - 1.0 (mg/dL)   Nitrite NEGATIVE  NEGATIVE    Leukocytes, UA NEGATIVE  NEGATIVE    Imaging Studies:  No results found.  Assessment: Joyce WASKEYis  25y.o. G1P0 at 372w1dresents with a normal exam, no contractions.  Plan Dr. RiMarvel Planotified.  Pt to reschedule appt Monday  Joyce Solomon,Joyce Solomon 11/11/20124:16 PM

## 2011-10-11 ENCOUNTER — Other Ambulatory Visit (HOSPITAL_COMMUNITY): Payer: Self-pay | Admitting: Obstetrics and Gynecology

## 2011-10-11 DIAGNOSIS — IMO0002 Reserved for concepts with insufficient information to code with codable children: Secondary | ICD-10-CM

## 2011-10-12 ENCOUNTER — Inpatient Hospital Stay (HOSPITAL_COMMUNITY)
Admission: AD | Admit: 2011-10-12 | Discharge: 2011-10-12 | Disposition: A | Discharge status: Home / Self Care | Payer: Medicaid Other | Source: Ambulatory Visit | Attending: Obstetrics and Gynecology | Admitting: Obstetrics and Gynecology

## 2011-10-12 DIAGNOSIS — O368390 Maternal care for abnormalities of the fetal heart rate or rhythm, unspecified trimester, not applicable or unspecified: Secondary | ICD-10-CM | POA: Insufficient documentation

## 2011-10-13 ENCOUNTER — Ambulatory Visit (HOSPITAL_COMMUNITY)
Admission: RE | Admit: 2011-10-13 | Discharge: 2011-10-13 | Disposition: A | Discharge status: Home / Self Care | Payer: Medicaid Other | Source: Ambulatory Visit | Attending: Obstetrics and Gynecology | Admitting: Obstetrics and Gynecology

## 2011-10-13 DIAGNOSIS — O365990 Maternal care for other known or suspected poor fetal growth, unspecified trimester, not applicable or unspecified: Secondary | ICD-10-CM | POA: Insufficient documentation

## 2011-10-13 DIAGNOSIS — Z3689 Encounter for other specified antenatal screening: Secondary | ICD-10-CM | POA: Insufficient documentation

## 2011-10-13 DIAGNOSIS — IMO0002 Reserved for concepts with insufficient information to code with codable children: Secondary | ICD-10-CM

## 2011-10-13 NOTE — Progress Notes (Signed)
Reviewed FHT from 11-22.  Reactive NST.

## 2011-10-16 ENCOUNTER — Other Ambulatory Visit (HOSPITAL_COMMUNITY): Payer: Self-pay | Admitting: Obstetrics and Gynecology

## 2011-10-16 ENCOUNTER — Ambulatory Visit (HOSPITAL_COMMUNITY)
Admission: RE | Admit: 2011-10-16 | Discharge: 2011-10-16 | Disposition: A | Discharge status: Home / Self Care | Payer: Medicaid Other | Source: Ambulatory Visit | Attending: Obstetrics and Gynecology | Admitting: Obstetrics and Gynecology

## 2011-10-16 ENCOUNTER — Inpatient Hospital Stay (HOSPITAL_COMMUNITY)
Admission: AD | Admit: 2011-10-16 | Discharge: 2011-10-16 | Disposition: A | Discharge status: Home / Self Care | Payer: Medicaid Other | Source: Ambulatory Visit | Attending: Obstetrics and Gynecology | Admitting: Obstetrics and Gynecology

## 2011-10-16 DIAGNOSIS — O288 Other abnormal findings on antenatal screening of mother: Secondary | ICD-10-CM

## 2011-10-16 DIAGNOSIS — O365990 Maternal care for other known or suspected poor fetal growth, unspecified trimester, not applicable or unspecified: Secondary | ICD-10-CM | POA: Insufficient documentation

## 2011-10-16 DIAGNOSIS — Z3689 Encounter for other specified antenatal screening: Secondary | ICD-10-CM | POA: Insufficient documentation

## 2011-10-17 ENCOUNTER — Encounter (HOSPITAL_COMMUNITY): Payer: Self-pay | Admitting: *Deleted

## 2011-10-17 ENCOUNTER — Telehealth (HOSPITAL_COMMUNITY): Payer: Self-pay | Admitting: *Deleted

## 2011-10-17 NOTE — Telephone Encounter (Signed)
Preadmission screen  

## 2011-10-18 ENCOUNTER — Other Ambulatory Visit: Payer: Self-pay | Admitting: Obstetrics and Gynecology

## 2011-10-19 ENCOUNTER — Inpatient Hospital Stay (HOSPITAL_COMMUNITY)
Admission: RE | Admit: 2011-10-19 | Discharge: 2011-10-23 | DRG: 774 | Disposition: A | Discharge status: Home / Self Care | Payer: Medicaid Other | Source: Ambulatory Visit | Attending: Obstetrics and Gynecology | Admitting: Obstetrics and Gynecology

## 2011-10-19 ENCOUNTER — Encounter (HOSPITAL_COMMUNITY): Payer: Self-pay

## 2011-10-19 DIAGNOSIS — E079 Disorder of thyroid, unspecified: Secondary | ICD-10-CM | POA: Diagnosis present

## 2011-10-19 DIAGNOSIS — O365990 Maternal care for other known or suspected poor fetal growth, unspecified trimester, not applicable or unspecified: Principal | ICD-10-CM | POA: Diagnosis present

## 2011-10-19 DIAGNOSIS — E0590 Thyrotoxicosis, unspecified without thyrotoxic crisis or storm: Secondary | ICD-10-CM | POA: Diagnosis present

## 2011-10-19 DIAGNOSIS — O411090 Infection of amniotic sac and membranes, unspecified, unspecified trimester, not applicable or unspecified: Secondary | ICD-10-CM | POA: Diagnosis present

## 2011-10-19 LAB — CBC
MCH: 32.1 pg (ref 26.0–34.0)
MCHC: 34.1 g/dL (ref 30.0–36.0)
Platelets: 278 10*3/uL (ref 150–400)
RBC: 4.08 MIL/uL (ref 3.87–5.11)

## 2011-10-19 MED ORDER — DINOPROSTONE 10 MG VA INST
10.0000 mg | VAGINAL_INSERT | Freq: Once | VAGINAL | Status: AC
  Administered 2011-10-19: 10 mg via VAGINAL
  Filled 2011-10-19: qty 1

## 2011-10-19 MED ORDER — LACTATED RINGERS IV SOLN
INTRAVENOUS | Status: DC
  Administered 2011-10-19 – 2011-10-21 (×4): via INTRAVENOUS
  Administered 2011-10-21: 300 mL via INTRAVENOUS

## 2011-10-19 MED ORDER — CITRIC ACID-SODIUM CITRATE 334-500 MG/5ML PO SOLN: 30.0000 mL | ORAL | Status: DC | PRN

## 2011-10-19 MED ORDER — OXYCODONE-ACETAMINOPHEN 5-325 MG PO TABS
2.0000 | ORAL_TABLET | ORAL | Status: DC | PRN
  Administered 2011-10-21: 2 via ORAL
  Filled 2011-10-19: qty 2

## 2011-10-19 MED ORDER — TERBUTALINE SULFATE 1 MG/ML IJ SOLN: 0.2500 mg | Freq: Once | INTRAMUSCULAR | Status: AC | PRN

## 2011-10-19 MED ORDER — LIDOCAINE HCL (PF) 1 % IJ SOLN
30.0000 mL | INTRAMUSCULAR | Status: AC | PRN
  Administered 2011-10-21: 30 mL via SUBCUTANEOUS
  Filled 2011-10-19: qty 30

## 2011-10-19 MED ORDER — LACTATED RINGERS IV SOLN
500.0000 mL | INTRAVENOUS | Status: DC | PRN
  Administered 2011-10-20: 300 mL via INTRAVENOUS

## 2011-10-19 MED ORDER — ZOLPIDEM TARTRATE 10 MG PO TABS
10.0000 mg | ORAL_TABLET | Freq: Every evening | ORAL | Status: DC | PRN
  Administered 2011-10-20: 10 mg via ORAL
  Filled 2011-10-19: qty 1

## 2011-10-19 MED ORDER — OXYTOCIN 20 UNITS IN LACTATED RINGERS INFUSION - SIMPLE
125.0000 mL/h | Freq: Once | INTRAVENOUS | Status: AC
  Administered 2011-10-21: 500 mL/h via INTRAVENOUS

## 2011-10-19 MED ORDER — FLEET ENEMA 7-19 GM/118ML RE ENEM: 1.0000 | ENEMA | RECTAL | Status: DC | PRN

## 2011-10-19 MED ORDER — OXYTOCIN BOLUS FROM INFUSION
500.0000 mL | Freq: Once | INTRAVENOUS | Status: DC
  Filled 2011-10-19: qty 500

## 2011-10-19 MED ORDER — IBUPROFEN 600 MG PO TABS
600.0000 mg | ORAL_TABLET | Freq: Four times a day (QID) | ORAL | Status: DC | PRN
  Filled 2011-10-19: qty 1

## 2011-10-19 MED ORDER — ACETAMINOPHEN 325 MG PO TABS
650.0000 mg | ORAL_TABLET | ORAL | Status: DC | PRN
  Administered 2011-10-21: 650 mg via ORAL
  Filled 2011-10-19: qty 2

## 2011-10-19 MED ORDER — ONDANSETRON HCL 4 MG/2ML IJ SOLN: 4.0000 mg | Freq: Four times a day (QID) | INTRAMUSCULAR | Status: DC | PRN

## 2011-10-19 NOTE — H&P (Signed)
Joyce Solomon is a 25 y.o. female G1P0 at 75 5/7 weeks with EDD 11/04/11  By LMP c/w a 20 week [place] presenting for induction for IUGR given a finding of IUGR on [place] 10/10/11 where EFW was less than 10%ile at 2071 grams.  Dopplers and fluid were WNL and cervix unfavorable so was monitored closely until closer to 38 weeks, now brought in for ripening and induction.  Prenatal care has been complicated by a h/o ulcerative colitis in the patient, but this has been stable on Lialda.  The patient also has had many MAU visits for various complaints and has had a flat affect for most of pregnancy.  We have asked her many times about depression and abuse and she has denied an issue.  She does not tolerate pelvic exams well, and initially requested an elective c-section at the beginning of her prenatal care, but has since agreed to a TOL..  Maternal Medical History:  Fetal activity: Perceived fetal activity is normal.      OB History    Grav Para Term Preterm Abortions TAB SAB Ect Mult Living   1         0     Past Medical History  Diagnosis Date  . Anemia   . Hyperthyroidism   . Chronic ulcerative colitis   . Blood transfusion   . Ulcerative colitis    Past Surgical History  Procedure Date  . Colon biopsy   . Tonsillectomy    Family History: family history is negative for Anesthesia problems, and Hypotension, and Malignant hyperthermia, and Pseudochol deficiency, . Social History:  reports that she quit smoking about 17 months ago. Her smoking use included Cigarettes. She quit after 3 years of use. She does not have any smokeless tobacco history on file. She reports that she does not drink alcohol or use illicit drugs.  ROS  Dilation: Closed Effacement (%): Thick Station: -3 Exam by:: Leticia Clas Blood pressure 122/80, pulse 82, temperature 97.8 F (36.6 C), temperature source Oral, resp. rate 22, height 4' 11"  (1.499 m), weight 61.236 kg (135 lb). Maternal Exam:  Uterine Assessment:  Contraction frequency is rare.   Abdomen: Estimated fetal weight is 4 1/2 lbs-5 lbs on [place] .   Fetal presentation: vertex  Introitus: Normal vulva. Normal vagina.    Physical Exam  Constitutional: She is oriented to person, place, and time. She appears well-developed.  Cardiovascular: Normal rate and regular rhythm.   Respiratory: Effort normal and breath sounds normal.  GI: Soft. Bowel sounds are normal.  Genitourinary: Vagina normal.       SGA with fundal height at 33 cm  Neurological: She is alert and oriented to person, place, and time.  Psychiatric:       Affect flat    Prenatal labs: ABO, Rh:  B positive Antibody:  negative Rubella: Immune (07/23 0000) RPR: NON REACTIVE (04/23 1559)  HBsAg:   neg HIV: Non-reactive (07/23 0000)  GBS: Negative (11/15 0000)  Quad screen negative One hour GTT 92 Hgb AA Assessment/Plan: Pt with IUGR at less than 10%ile and unfavorable cervix, admitted to begin ripening process with cervidil.   Logan Bores 10/19/2011, 10:30 PM

## 2011-10-19 NOTE — Progress Notes (Signed)
Vtx by u/s.  [place] for cytotec.

## 2011-10-19 NOTE — Progress Notes (Signed)
MD made aware of pt admission for induction of labor. Will be at bedside. Reported SVE, FHT, uterine contraction pattern. Will continue to monitor.

## 2011-10-20 ENCOUNTER — Inpatient Hospital Stay (HOSPITAL_COMMUNITY): Payer: Medicaid Other | Admitting: Anesthesiology

## 2011-10-20 ENCOUNTER — Encounter (HOSPITAL_COMMUNITY): Payer: Self-pay | Admitting: Anesthesiology

## 2011-10-20 LAB — RPR: RPR Ser Ql: NONREACTIVE

## 2011-10-20 MED ORDER — NALBUPHINE SYRINGE 5 MG/0.5 ML
INJECTION | INTRAMUSCULAR | Status: DC | PRN
  Administered 2011-10-20: 10 mg via INTRAVENOUS
  Administered 2011-10-20: 5 mg via INTRAVENOUS
  Filled 2011-10-20: qty 1
  Filled 2011-10-20 (×2): qty 0.5

## 2011-10-20 MED ORDER — DIPHENHYDRAMINE HCL 25 MG PO CAPS
25.0000 mg | ORAL_CAPSULE | Freq: Four times a day (QID) | ORAL | Status: DC | PRN
Start: 2011-10-20 — End: 2011-10-21
  Administered 2011-10-20: 25 mg via ORAL
  Filled 2011-10-20: qty 1

## 2011-10-20 MED ORDER — DIPHENHYDRAMINE HCL 50 MG/ML IJ SOLN
12.5000 mg | INTRAMUSCULAR | Status: DC | PRN
  Administered 2011-10-20 (×2): 12.5 mg via INTRAVENOUS
  Filled 2011-10-20: qty 1

## 2011-10-20 MED ORDER — LIDOCAINE HCL 1.5 % IJ SOLN
INTRAMUSCULAR | Status: DC | PRN
  Administered 2011-10-20 (×2): 5 mL via EPIDURAL

## 2011-10-20 MED ORDER — PHENYLEPHRINE 40 MCG/ML (10ML) SYRINGE FOR IV PUSH (FOR BLOOD PRESSURE SUPPORT)
80.0000 ug | PREFILLED_SYRINGE | INTRAVENOUS | Status: DC | PRN
  Filled 2011-10-20 (×2): qty 5

## 2011-10-20 MED ORDER — EPHEDRINE 5 MG/ML INJ
10.0000 mg | INTRAVENOUS | Status: DC | PRN
  Filled 2011-10-20 (×2): qty 4

## 2011-10-20 MED ORDER — LACTATED RINGERS IV SOLN
500.0000 mL | Freq: Once | INTRAVENOUS | Status: AC
  Administered 2011-10-20: 500 mL via INTRAVENOUS

## 2011-10-20 MED ORDER — PHENYLEPHRINE 40 MCG/ML (10ML) SYRINGE FOR IV PUSH (FOR BLOOD PRESSURE SUPPORT)
80.0000 ug | PREFILLED_SYRINGE | INTRAVENOUS | Status: DC | PRN
  Filled 2011-10-20: qty 5

## 2011-10-20 MED ORDER — FENTANYL 2.5 MCG/ML BUPIVACAINE 1/10 % EPIDURAL INFUSION (WH - ANES)
14.0000 mL/h | INTRAMUSCULAR | Status: DC
  Administered 2011-10-20 – 2011-10-21 (×5): 14 mL/h via EPIDURAL
  Filled 2011-10-20 (×5): qty 60

## 2011-10-20 MED ORDER — OXYTOCIN 20 UNITS IN LACTATED RINGERS INFUSION - SIMPLE
1.0000 m[IU]/min | INTRAVENOUS | Status: DC
  Administered 2011-10-20: 2 m[IU]/min via INTRAVENOUS
  Filled 2011-10-20 (×2): qty 1000

## 2011-10-20 MED ORDER — EPHEDRINE 5 MG/ML INJ
10.0000 mg | INTRAVENOUS | Status: DC | PRN
  Filled 2011-10-20: qty 4

## 2011-10-20 NOTE — Progress Notes (Signed)
   Subjective: Pt comfortable with epidural  Objective: BP 100/61  Pulse 91  Temp(Src) 98.3 F (36.8 C) (Axillary)  Resp 18  Ht 4' 11"  (1.499 m)  Wt 61.236 kg (135 lb)  BMI 27.27 kg/m2  SpO2 100%      FHT:  FHR: 130 bpm, variability: minimal ,  accelerations:  Present,  decelerations:  Absent--very samll accels intermittently UC:   regular, every 2-3 minutes SVE:  C/5/-1  Labs: Lab Results  Component Value Date   WBC 7.4 10/19/2011   HGB 13.1 10/19/2011   HCT 38.4 10/19/2011   MCV 94.1 10/19/2011   PLT 278 10/19/2011    Assessment / Plan: Pt progressing and tracing category II.  Has scalp stim and accels intermittently but has had diminished variability all day. Logan Bores 10/20/2011, 5:46 PM

## 2011-10-20 NOTE — Progress Notes (Signed)
   Subjective: Pt now comfortable with epidural  Objective: BP 92/53  Pulse 67  Temp(Src) 97.6 F (36.4 C) (Oral)  Resp 18  Ht 4' 11"  (1.499 m)  Wt 61.236 kg (135 lb)  BMI 27.27 kg/m2  SpO2 100%      FHT:  FHR: 130 bpm, variability: minimal ,  accelerations:  Present,  decelerations:  Absent UC:   regular, every 2-5 minutes SVE: 3/80/-1 IUPC placed to adjust pitocin Scalp stim noted with exam to 140's, decreased variability but had nubain at 9am  Labs: Lab Results  Component Value Date   WBC 7.4 10/19/2011   HGB 13.1 10/19/2011   HCT 38.4 10/19/2011   MCV 94.1 10/19/2011   PLT 278 10/19/2011    Assessment / Plan: Early labor, will adjust pitocin as needed to gain adequate MVU's Making cervical change Joyce Solomon W 10/20/2011, 1:35 PM

## 2011-10-20 NOTE — Progress Notes (Signed)
Dr. Marvel Plan called to ask about patient status.  Informed of FHR, uterine activity, and interventions to include Benadryl.  Will perform cervical exam and call doctor back.

## 2011-10-20 NOTE — Anesthesia Procedure Notes (Signed)

## 2011-10-20 NOTE — Anesthesia Preprocedure Evaluation (Addendum)
Anesthesia Evaluation  Patient identified by MRN, date of birth, ID band Patient awake    Reviewed: Allergy & Precautions, H&P , Patient's Chart, lab work & pertinent test results  Airway Mallampati: III TM Distance: >3 FB Neck ROM: full    Dental No notable dental hx.    Pulmonary neg pulmonary ROS,  clear to auscultation  Pulmonary exam normal       Cardiovascular neg cardio ROS regular Normal    Neuro/Psych Negative Neurological ROS  Negative Psych ROS   GI/Hepatic negative GI ROS, Neg liver ROS, PUD,   Endo/Other  Negative Endocrine ROSHyperthyroidism   Renal/GU negative Renal ROS     Musculoskeletal   Abdominal   Peds  Hematology negative hematology ROS (+)   Anesthesia Other Findings Ulcerative colitis  Reproductive/Obstetrics (+) Pregnancy                          Anesthesia Physical Anesthesia Plan  ASA: III  Anesthesia Plan: Epidural   Post-op Pain Management:    Induction:   Airway Management Planned:   Additional Equipment:   Intra-op Plan:   Post-operative Plan:   Informed Consent: I have reviewed the patients History and Physical, chart, labs and discussed the procedure including the risks, benefits and alternatives for the proposed anesthesia with the patient or authorized representative who has indicated his/her understanding and acceptance.     Plan Discussed with:   Anesthesia Plan Comments:        Anesthesia Quick Evaluation

## 2011-10-20 NOTE — Progress Notes (Signed)
MD made aware of pt status. SVE, FHT, uterine contraction pattern and request for pain med. New orders given. MD attending a cesarean section. Will continue to monitor.

## 2011-10-20 NOTE — Progress Notes (Signed)
Joyce Solomon is a 25 y.o. G1P0 at 100w6dfor induction of IUGR baby  Subjective: Pt feeling minimal ctx, some aches  Objective: BP 103/48  Pulse 69  Temp(Src) 97.7 F (36.5 C) (Oral)  Resp 18  Ht 4' 11"  (1.499 m)  Wt 61.236 kg (135 lb)  BMI 27.27 kg/m2  SpO2 98%      FHT:  FHR: 125 bpm, variability: moderate,  accelerations:  Present,  decelerations:  Absent--positive scalp stim, variability had been slightly diminished UC:   irregular SVE:   Dilation: 1 Effacement (%): 70 Station: -2 Exam by:: dr rMarvel PlanAROM clear  Labs: Lab Results  Component Value Date   WBC 7.4 10/19/2011   HGB 13.1 10/19/2011   HCT 38.4 10/19/2011   MCV 94.1 10/19/2011   PLT 278 10/19/2011    Assessment / Plan: Induction of labor for IUGR.  Cervidil removed and AROM performed, will begin pitocin.  Pt is extremely anxious with exams and is difficult to coach, may need early epidural.  Freeland Pracht W 10/20/2011, 8:38 AM

## 2011-10-20 NOTE — Progress Notes (Signed)
Dr. Marvel Plan notified of vaginal exam, FHR, and MVUs.  Will re-check cervix at 2330 and call with results.

## 2011-10-21 ENCOUNTER — Encounter (HOSPITAL_COMMUNITY): Payer: Self-pay

## 2011-10-21 ENCOUNTER — Other Ambulatory Visit: Payer: Self-pay | Admitting: Obstetrics and Gynecology

## 2011-10-21 MED ORDER — SIMETHICONE 80 MG PO CHEW: 80.0000 mg | CHEWABLE_TABLET | ORAL | Status: DC | PRN

## 2011-10-21 MED ORDER — BENZOCAINE-MENTHOL 20-0.5 % EX AERO
INHALATION_SPRAY | CUTANEOUS | Status: AC
  Filled 2011-10-21: qty 56

## 2011-10-21 MED ORDER — ONDANSETRON HCL 4 MG/2ML IJ SOLN: 4.0000 mg | INTRAMUSCULAR | Status: DC | PRN

## 2011-10-21 MED ORDER — SODIUM CHLORIDE 0.9 % IV SOLN
3.0000 g | Freq: Four times a day (QID) | INTRAVENOUS | Status: DC
  Administered 2011-10-21 (×2): 3 g via INTRAVENOUS
  Filled 2011-10-21 (×5): qty 3

## 2011-10-21 MED ORDER — TETANUS-DIPHTH-ACELL PERTUSSIS 5-2.5-18.5 LF-MCG/0.5 IM SUSP
0.5000 mL | Freq: Once | INTRAMUSCULAR | Status: AC
  Administered 2011-10-22: 0.5 mL via INTRAMUSCULAR
  Filled 2011-10-21: qty 0.5

## 2011-10-21 MED ORDER — ZOLPIDEM TARTRATE 5 MG PO TABS: 5.0000 mg | ORAL_TABLET | Freq: Every evening | ORAL | Status: DC | PRN

## 2011-10-21 MED ORDER — DIPHENHYDRAMINE HCL 25 MG PO CAPS: 25.0000 mg | ORAL_CAPSULE | Freq: Four times a day (QID) | ORAL | Status: DC | PRN

## 2011-10-21 MED ORDER — IBUPROFEN 600 MG PO TABS
600.0000 mg | ORAL_TABLET | Freq: Four times a day (QID) | ORAL | Status: DC
  Administered 2011-10-21 – 2011-10-23 (×9): 600 mg via ORAL
  Filled 2011-10-21 (×7): qty 1

## 2011-10-21 MED ORDER — WITCH HAZEL-GLYCERIN EX PADS: 1.0000 "application " | MEDICATED_PAD | CUTANEOUS | Status: DC | PRN

## 2011-10-21 MED ORDER — SODIUM CHLORIDE 0.9 % IV SOLN
3.0000 g | Freq: Four times a day (QID) | INTRAVENOUS | Status: AC
  Administered 2011-10-21 (×2): 3 g via INTRAVENOUS
  Filled 2011-10-21 (×2): qty 3

## 2011-10-21 MED ORDER — PRENATAL PLUS 27-1 MG PO TABS
1.0000 | ORAL_TABLET | Freq: Every day | ORAL | Status: DC
  Administered 2011-10-21 – 2011-10-23 (×3): 1 via ORAL
  Filled 2011-10-21 (×3): qty 1

## 2011-10-21 MED ORDER — OXYCODONE-ACETAMINOPHEN 5-325 MG PO TABS
1.0000 | ORAL_TABLET | ORAL | Status: DC | PRN
  Administered 2011-10-23 (×2): 1 via ORAL
  Filled 2011-10-21 (×2): qty 1

## 2011-10-21 MED ORDER — BENZOCAINE-MENTHOL 20-0.5 % EX AERO: 1.0000 "application " | INHALATION_SPRAY | CUTANEOUS | Status: DC | PRN

## 2011-10-21 MED ORDER — SENNOSIDES-DOCUSATE SODIUM 8.6-50 MG PO TABS
2.0000 | ORAL_TABLET | Freq: Every day | ORAL | Status: DC
  Administered 2011-10-21 – 2011-10-22 (×2): 2 via ORAL

## 2011-10-21 MED ORDER — DIBUCAINE 1 % RE OINT: 1.0000 "application " | TOPICAL_OINTMENT | RECTAL | Status: DC | PRN

## 2011-10-21 MED ORDER — LANOLIN HYDROUS EX OINT: TOPICAL_OINTMENT | CUTANEOUS | Status: DC | PRN

## 2011-10-21 MED ORDER — ONDANSETRON HCL 4 MG PO TABS: 4.0000 mg | ORAL_TABLET | ORAL | Status: DC | PRN

## 2011-10-21 NOTE — Consult Note (Signed)
Called to room 160 by Apgar page arriving at ~ 5 minutes of age. Report at that time of tight nuchal cord that could not be reduced; infant delivered through nuchal cord. No spontaneous initial cry but eyes wide open. By report fetus IUGR but does not appear so clinically.  OB RN using bag/mask as team took over.  At that time HR > 100 and partial flexion of extremities; central color pink.  IPPV with bag/mask continued for several minutes as infant recovered further.   Impression is that infant had "loss" of placental transfusion because of tight nuchal cord and initial apnea and wide eyed expression was reflective of acute volume depletion.  Normal recovery with no period of extended hypoxemia.  No dysmorphic features.   Explanation given to parents and infant allowed to remain in L/D room.   Care to assigned pediatrician.    Clementeen Graham MD [facility]

## 2011-10-21 NOTE — Progress Notes (Signed)
PSYCHOSOCIAL ASSESSMENT ~ MATERNAL/CHILD Name: Joyce Solomon         Age: 26 day    Referral Date : 10/21/2011   Reason/Source: CN/depression I. FAMILY/HOME ENVIRONMENT A. Child's Legal Guardian X Parent(s)  Name: Joyce Solomon    [address] [place], [place] [address]       Name: Joyce Solomon, same address B. Other Household Members/Support Persons   Paternal and Maternal grandparents C.   Other Support : paternal uncle, maternal aunt II. PSYCHOSOCIAL DATA A. Information Source X Patient Interview  X Family Interview            B. Occupational hygienist X Employment :  [employer] X Medicaid     County: [place]       X Food Stamps      X WIC      C. Cultural and Environment Information Cultural Issues Impacting Care:  N/A III. STRENGTHS X Supportive family/friends   X Adequate Resources  X Compliance with medical plan  X Home prepared for Child (including basic supplies)                 X Other: [facility] IV. RISK FACTORS AND CURRENT PROBLEMS                      X No Problems Noted  V. SOCIAL WORK ASSESSMENT LCSW met with MOB and FOB at bedside to assess strengths and needs related to referral.  MOB has health issues requiring ongoing following up and medication management.  This is MOB and FOB first child.  It is also the paternal grandparents first grandchild.  FOB family very excited, including his siblings.  Family from both sides have come to visit and they are all supportive.  MOB and FOB were quiet and soft spoken.  Parents report being very tired given baby just born early this morning.  They have had various visits from staff and family.  MOB and FOB live together, and they report baby will stay in the room with them in her crib.  They have family in [place] who will be sending a care package of supplies to them.  Parents report having adequate support and resources to adequately care for their baby.  MOB has plans to eventually go back to school for a career as a  Psychologist, sport and exercise.  She plans to stay home with baby until baby is a little older.  FOB works at [employer] and has a variable schedule.  MOB admits to missing FOB when he has to work.  Paternal grandma offered for parents to stay with her for a while during the newborn period, and parents have not yet agreed on that plan.  MOB was breastfeeding and commended her for her choice of feeding.  MOB and FOB were happy about baby's arrival and look forward to the comforts of home.  MOB easily engaged and comforted her baby.  FOB discussed how he has been supporting MOB and contributing to their care.  They did not express any concerns or have any needs at this time, other than some rest and quiet.  MOB and FOB expressed understanding at being able to reach LCSW support during their stay if needed.    VI. SOCIAL WORK PLAN X No Further Intervention Required/No Barriers to Discharge  Dorian Furnace, LCSW, 10/21/2011, 3:51 pm

## 2011-10-21 NOTE — Progress Notes (Signed)
  Subjective: Pt feels some pressure  Objective: BP 122/68  Pulse 125  Temp(Src) 97.9 F (36.6 C) (Oral)  Resp 18  Ht 4' 11"  (1.499 m)  Wt 61.236 kg (135 lb)  BMI 27.27 kg/m2  SpO2 100%   Total I/O In: 482.1 [I.V.:482.1] Out: 300 [Urine:300]  FHT: category II improved variability had a few lates that resolved with repositioning, baseline is increasing, but no temp yet UC:   regular, every 2-3 minutes SVE:  C/6-7/0 OP Labs: Lab Results  Component Value Date   WBC 7.4 10/19/2011   HGB 13.1 10/19/2011   HCT 38.4 10/19/2011   MCV 94.1 10/19/2011   PLT 278 10/19/2011    Assessment / Plan: Pt making slow progress over the last several hours, but feels OP.  Has made some change.  FHR acceptable, FSE applied to monitor closely. Will watch temp and follow progress. Logan Bores 10/21/2011, 12:03 AM

## 2011-10-21 NOTE — Progress Notes (Signed)
0745 Pt transferred to room 129 per w/c in stable condition

## 2011-10-21 NOTE — Progress Notes (Addendum)
Dr. Marvel Plan notified via phone of vaginal exam, maternal temp, and interventions.  New order received for Unasyn 3gm IV q 6 hrs.  Will continue to monitor temp and recheck cervix in 2 hours.

## 2011-10-22 LAB — CBC
HCT: 30.1 % — ABNORMAL LOW (ref 36.0–46.0)
Hemoglobin: 10.4 g/dL — ABNORMAL LOW (ref 12.0–15.0)
MCH: 32 pg (ref 26.0–34.0)
MCHC: 34.6 g/dL (ref 30.0–36.0)
MCV: 92.6 fL (ref 78.0–100.0)
RBC: 3.25 MIL/uL — ABNORMAL LOW (ref 3.87–5.11)

## 2011-10-22 NOTE — Progress Notes (Signed)
Post Partum Day 1 Subjective: no complaints and tolerating PO  Objective: Blood pressure 96/54, pulse 93, temperature 98 F (36.7 C), temperature source Oral, resp. rate 18, height 4' 11"  (1.499 m), weight 61.236 kg (135 lb), SpO2 100.00%, unknown if currently breastfeeding.  Physical Exam:  General: alert Lochia: appropriate Uterine Fundus: firm    Basename 10/22/11 0500 10/19/11 2110  HGB 10.4* 13.1  HCT 30.1* 38.4    Assessment/Plan: Plan for discharge tomorrow Has been seen by SW   LOS: 3 days   Leverne Amrhein W 10/22/2011, 10:05 AM

## 2011-10-23 MED ORDER — IBUPROFEN 600 MG PO TABS: 600.0000 mg | ORAL_TABLET | Freq: Four times a day (QID) | ORAL | Status: AC

## 2011-10-23 MED ORDER — OXYCODONE-ACETAMINOPHEN 5-325 MG PO TABS: 1.0000 | ORAL_TABLET | ORAL | Status: AC | PRN

## 2011-10-23 NOTE — Progress Notes (Signed)
Post Partum Day 2 Subjective: no complaints, voiding and tolerating PO  Objective: Blood pressure 106/55, pulse 65, temperature 97.5 F (36.4 C), temperature source Oral, resp. rate 18, height 4' 11"  (1.499 m), weight 61.236 kg (135 lb), SpO2 99.00%, unknown if currently breastfeeding.  Physical Exam:  General: alert Lochia: appropriate Uterine Fundus: firm   Basename 10/22/11 0500  HGB 10.4*  HCT 30.1*    Assessment/Plan: Discharge home Motrin, percocet   LOS: 4 days   Paislynn Hegstrom W 10/23/2011, 10:45 AM

## 2011-10-23 NOTE — Discharge Summary (Signed)
Obstetric Discharge Summary Reason for Admission: induction of labor Prenatal Procedures: none Intrapartum Procedures: spontaneous vaginal delivery Postpartum Procedures: none Complications-Operative and Postpartum: first degree perineal laceration Hemoglobin  Date Value Range Status  10/22/2011 10.4* 12.0-15.0 (g/dL) Final     HCT  Date Value Range Status  10/22/2011 30.1* 36.0-46.0 (%) Final    Discharge Diagnoses: Term Pregnancy-delivered  Discharge Information: Date: 10/23/2011 Activity: pelvic rest Diet: routine Medications: Ibuprofen and Percocet Condition: improved Instructions: refer to practice specific booklet Discharge to: home   Newborn Data: Live born female  Birth Weight: 5 lb 7.8 oz (2489 g) APGAR: 6, 8  Home with mother.  Logan Bores 10/23/2011, 10:46 AM

## 2011-10-23 NOTE — Progress Notes (Signed)
UR chart review completed.  

## 2011-10-25 NOTE — Anesthesia Postprocedure Evaluation (Signed)
Anesthesia Post Note  Patient: Joyce Solomon  Procedure(s) Performed: * No procedures listed *  Anesthesia type: Epidural  Patient location: Mother/Baby  Post pain: Pain level controlled  Post assessment: Post-op Vital signs reviewed  Last Vitals: There were no vitals filed for this visit.  Post vital signs: Reviewed  Level of consciousness: awake  Complications: No apparent anesthesia complications

## 2011-10-29 ENCOUNTER — Inpatient Hospital Stay (HOSPITAL_COMMUNITY)
Admission: AD | Admit: 2011-10-29 | Discharge: 2011-10-29 | Disposition: A | Discharge status: Home / Self Care | Payer: Medicaid Other | Source: Ambulatory Visit | Attending: Obstetrics and Gynecology | Admitting: Obstetrics and Gynecology

## 2011-10-29 ENCOUNTER — Encounter (HOSPITAL_COMMUNITY): Payer: Self-pay | Admitting: *Deleted

## 2011-10-29 ENCOUNTER — Inpatient Hospital Stay (HOSPITAL_COMMUNITY): Payer: Medicaid Other

## 2011-10-29 DIAGNOSIS — IMO0002 Reserved for concepts with insufficient information to code with codable children: Secondary | ICD-10-CM

## 2011-10-29 LAB — URINALYSIS, ROUTINE W REFLEX MICROSCOPIC
Bilirubin Urine: NEGATIVE
Ketones, ur: >80 mg/dL — AB
Nitrite: NEGATIVE
Specific Gravity, Urine: >1.03 — ABNORMAL HIGH (ref 1.005–1.030)
Urobilinogen, UA: 0.2 mg/dL (ref 0.0–1.0)

## 2011-10-29 LAB — CBC
MCV: 91.2 fL (ref 78.0–100.0)
Platelets: 322 10*3/uL (ref 150–400)
RDW: 14.2 % (ref 11.5–15.5)
WBC: 12 10*3/uL — ABNORMAL HIGH (ref 4.0–10.5)

## 2011-10-29 LAB — URINE MICROSCOPIC-ADD ON

## 2011-10-29 NOTE — Progress Notes (Signed)
Patient had vaginal delivery on 12/1 by Dr. Marvel Plan

## 2011-10-29 NOTE — Progress Notes (Signed)
Onset of heavy vagina bleeding since this morning with huge clots concerned lost too much blood, having abdominal cramping.

## 2011-10-29 NOTE — Progress Notes (Signed)
Bimanual exam only by E.Rice,PA stiches intact moderate amount of vag bleeding noted

## 2011-10-29 NOTE — ED Provider Notes (Signed)
History   Pt presents today c/o heavy vag bleeding following vag delivery. She had a vag delivery 8 days ago. She states she had heavy bleeding immediately following delivery but it seemed to improve. Then this am she began having extremely heavy bleeding with clots. She denies fever or severe abd pain.  Chief Complaint  Patient presents with  . Vaginal Bleeding  . Abdominal Cramping   HPI  OB History    Grav Para Term Preterm Abortions TAB SAB Ect Mult Living   1 1 1       1       Past Medical History  Diagnosis Date  . Anemia   . Hyperthyroidism   . Chronic ulcerative colitis   . Blood transfusion   . Ulcerative colitis     Past Surgical History  Procedure Date  . Colon biopsy   . Tonsillectomy     Family History  Problem Relation Age of Onset  . Anesthesia problems Neg Hx   . Hypotension Neg Hx   . Malignant hyperthermia Neg Hx   . Pseudochol deficiency Neg Hx     History  Substance Use Topics  . Smoking status: Former Smoker -- 3 years    Types: Cigarettes    Quit date: 05/16/2010  . Smokeless tobacco: Not on file  . Alcohol Use: No    Allergies:  Allergies  Allergen Reactions  . Latex Rash    Prescriptions prior to admission  Medication Sig Dispense Refill  . ibuprofen (ADVIL,MOTRIN) 600 MG tablet Take 1 tablet (600 mg total) by mouth every 6 (six) hours.  30 tablet  1  . mesalamine (PENTASA) 500 MG CR capsule Take 1,000 mg by mouth daily.        [name] oxyCODONE-acetaminophen (PERCOCET) 5-325 MG per tablet Take 1-2 tablets by mouth every 3 (three) hours as needed (moderate - severe pain).  30 tablet  0    Review of Systems  Constitutional: Negative for fever.  Cardiovascular: Negative for chest pain.  Gastrointestinal: Positive for abdominal pain. Negative for nausea, vomiting, diarrhea and constipation.  Genitourinary: Negative for dysuria, urgency, frequency and hematuria.  Neurological: Negative for dizziness and headaches.    Psychiatric/Behavioral: Negative for depression and suicidal ideas.   Physical Exam   Blood pressure 127/63, pulse 76, temperature 97.9 F (36.6 C), temperature source Oral, resp. rate 16, height 4' 11"  (1.499 m), weight 133 lb 3.2 oz (60.419 kg), currently breastfeeding.  Physical Exam  Nursing note and vitals reviewed. Constitutional: She is oriented to person, place, and time. She appears well-developed and well-nourished. No distress.  HENT:  Head: Normocephalic and atraumatic.  Eyes: EOM are normal. Pupils are equal, round, and reactive to light.  GI: Soft. She exhibits no distension. There is tenderness. There is no rebound and no guarding.  Genitourinary: No vaginal discharge found.  Neurological: She is alert and oriented to person, place, and time.  Skin: Skin is warm and dry. She is not diaphoretic.  Psychiatric: She has a normal mood and affect. Her behavior is normal. Judgment and thought content normal.    MAU Course  Procedures  Results for orders placed during the hospital encounter of 10/29/11 (from the past 24 hour(s))  CBC     Status: Abnormal   Collection Time   10/29/11 10:03 AM      Component Value Range   WBC 12.0 (*) 4.0 - 10.5 (K/uL)   RBC 3.86 (*) 3.87 - 5.11 (MIL/uL)   Hemoglobin 12.1  12.0 -  15.0 (g/dL)   HCT 35.2 (*) 36.0 - 46.0 (%)   MCV 91.2  78.0 - 100.0 (fL)   MCH 31.3  26.0 - 34.0 (pg)   MCHC 34.4  30.0 - 36.0 (g/dL)   RDW 14.2  11.5 - 15.5 (%)   Platelets 322  150 - 400 (K/uL)  URINALYSIS, ROUTINE W REFLEX MICROSCOPIC     Status: Abnormal   Collection Time   10/29/11 10:25 AM      Component Value Range   Color, Urine YELLOW  YELLOW    APPearance HAZY (*) CLEAR    Specific Gravity, Urine >1.030 (*) 1.005 - 1.030    pH 6.0  5.0 - 8.0    Glucose, UA NEGATIVE  NEGATIVE (mg/dL)   Hgb urine dipstick LARGE (*) NEGATIVE    Bilirubin Urine NEGATIVE  NEGATIVE    Ketones, ur >80 (*) NEGATIVE (mg/dL)   Protein, ur 30 (*) NEGATIVE (mg/dL)    Urobilinogen, UA 0.2  0.0 - 1.0 (mg/dL)   Nitrite NEGATIVE  NEGATIVE    Leukocytes, UA TRACE (*) NEGATIVE   URINE MICROSCOPIC-ADD ON     Status: Abnormal   Collection Time   10/29/11 10:25 AM      Component Value Range   Squamous Epithelial / LPF FEW (*) RARE    WBC, UA 3-6  <3 (WBC/hpf)   RBC / HPF 21-50  <3 (RBC/hpf)   Bacteria, UA FEW (*) RARE     US Pelvis Complete  10/29/2011  *RADIOLOGY REPORT*  Clinical Data: 1 week status post vaginal delivery.  Heavy vaginal bleeding.  Evaluate for retained products.  TRANSABDOMINAL ULTRASOUND OF PELVIS  Technique:  Transabdominal ultrasound examination of the pelvis was performed including evaluation of the uterus, ovaries, adnexal regions, and pelvic cul-de-sac.  Comparison:  None  Findings:  Uterus measures 12.2 x 6.7 x 8.3 cm.  A single small subserosal fibroid is seen in the right posterior uterine body measuring 3.7 cm.  Endometrium:  A large heterogeneous hypervascular mass is seen within the endometrial cavity which also shows extension into the anterior myometrial wall.  This measures approximately 6.4 x 3.1 x 6.9 cm, and is consistent with retained products of conception with anterior placenta increta.  Right Ovary measures 3.1 x 1.8 x 1.9 cm.  Normal appearance.  Left Ovary measures 2.6 x 1.5 x 2.1 cm.  Normal appearance.  Other Findings:  No other abnormality identified.  IMPRESSION:  1.  6.9 cm hypervascular mass in the endometrial cavity with extension into the anterior myometrial wall.  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 2.  3.7 cm right posterior subserosal fibroid. 3.  Normal appearance of both ovaries.  No evidence of free fluid.  Original Report Authenticated By: Marlaine Hind, M.D.    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 Willis Modena. He is on his way to assume care of pt.  Alinda Deem. Khole Arterburn III, DrHSc, MPAS, PA-C  10/29/2011,  11:44 AM   Eliott Nine, PA 10/29/11 1153

## 2011-10-29 NOTE — Progress Notes (Signed)
Discussed with pt that u/s reveals 6 cm intrauterine mass c/w probable retained placenta, probably with increta.  Discussed that D&C could worsen bleeding and necessitate hysterectomy.  Discussed that options are to observe and let this placental tissue gradually resolve, or do hysterectomy.  She agrees to observe, hydrate, OTC Fe bid, call for heavy bleeding.  Advised to expect some bleeding, call if heavy.

## 2011-11-09 ENCOUNTER — Encounter (HOSPITAL_COMMUNITY): Payer: Self-pay | Admitting: *Deleted

## 2011-11-09 ENCOUNTER — Inpatient Hospital Stay (HOSPITAL_COMMUNITY)
Admission: AD | Admit: 2011-11-09 | Discharge: 2011-11-09 | Disposition: A | Discharge status: Home / Self Care | Payer: Medicaid Other | Source: Ambulatory Visit | Attending: Obstetrics and Gynecology | Admitting: Obstetrics and Gynecology

## 2011-11-09 DIAGNOSIS — IMO0002 Reserved for concepts with insufficient information to code with codable children: Secondary | ICD-10-CM

## 2011-11-09 MED ORDER — IBUPROFEN 800 MG PO TABS: 800.0000 mg | ORAL_TABLET | Freq: Three times a day (TID) | ORAL | Status: AC | PRN

## 2011-11-09 MED ORDER — HYDROCODONE-ACETAMINOPHEN 5-500 MG PO TABS: 1.0000 | ORAL_TABLET | Freq: Four times a day (QID) | ORAL | Status: AC | PRN

## 2011-11-09 MED ORDER — MISOPROSTOL 200 MCG PO TABS: 800.0000 ug | ORAL_TABLET | Freq: Once | ORAL | Status: DC

## 2011-11-09 NOTE — Progress Notes (Signed)
PT SAYS SHE DEL ON 10-21-2011- VAG- BY  DR Marvel Plan-. BREAST FEEDING.  PT  SAYS WAS IN OFFICE   ON 10-30-2011- TOLD HER SHE HAD PLACENTA STILL INSIDE- AND WAS GOING TO GIVE   IT 8-10 DAYS   TO SEE IF PROBLEM  FIXED.-- BUT  HAD U/S IN OFFICE YESTERDAY-  DR RICHARDSON TOLD HER  SHE STILL  HAS   PLACENTA INSIDE.  PLAN WAS FOR DR Marvel Plan TO CALL HER-  BUT SHE DIDN;T .  SO PT CALLED OFFICE- NOBODY CALLED HER BACK.   PT FEELS CRAMPING-    TOOK IBUPROFEN-   YESTERDAY- NONE TODAY... BLEEDING  IS LIGHT SHE SAYS.

## 2011-11-09 NOTE — ED Provider Notes (Signed)
History     No chief complaint on file.  HPI 25 y.o. G1P1001 s/p SVD on 12/1. Had u/s in office 12/10 showing retained POC, U/S yesterday showed persistent retained products. Bleeding light, + cramping.    Past Medical History  Diagnosis Date  . Anemia   . Hyperthyroidism   . Chronic ulcerative colitis   . Blood transfusion   . Ulcerative colitis     Past Surgical History  Procedure Date  . Colon biopsy   . Tonsillectomy     Family History  Problem Relation Age of Onset  . Anesthesia problems Neg Hx   . Hypotension Neg Hx   . Malignant hyperthermia Neg Hx   . Pseudochol deficiency Neg Hx     History  Substance Use Topics  . Smoking status: Former Smoker -- 3 years    Types: Cigarettes    Quit date: 05/16/2010  . Smokeless tobacco: Not on file  . Alcohol Use: No    Allergies:  Allergies  Allergen Reactions  . Latex Rash    Prescriptions prior to admission  Medication Sig Dispense Refill  . acetaminophen (TYLENOL) 500 MG tablet Take 500 mg by mouth every 6 (six) hours as needed. stomach       . mesalamine (PENTASA) 500 MG CR capsule Take 1,000 mg by mouth daily.          Review of Systems  Constitutional: Negative.   Respiratory: Negative.   Cardiovascular: Negative.   Gastrointestinal: Positive for abdominal pain. Negative for nausea, vomiting, diarrhea and constipation.  Genitourinary: Negative for dysuria, urgency, frequency, hematuria and flank pain.       Positive for vaginal bleeding   Musculoskeletal: Negative.   Neurological: Negative.   Psychiatric/Behavioral: Negative.    Physical Exam   Blood pressure 129/64, pulse 79, temperature 98.6 F (37 C), temperature source Oral, resp. rate 18, height 4' 11"  (1.499 m), weight 125 lb 8 oz (56.926 kg), currently breastfeeding.  Physical Exam  Constitutional: She is oriented to person, place, and time. She appears well-developed and well-nourished. No distress.  HENT:  Head: Normocephalic and  atraumatic.  Cardiovascular: Normal rate, regular rhythm and normal heart sounds.   Respiratory: Effort normal and breath sounds normal. No respiratory distress.  GI: Soft. Bowel sounds are normal. She exhibits no distension and no mass. There is no tenderness. There is no rebound and no guarding.  Genitourinary: There is no rash or lesion on the right labia. There is no rash or lesion on the left labia. Uterus is not tender (firm). There is bleeding (scant) around the vagina.  Neurological: She is alert and oriented to person, place, and time.  Skin: Skin is warm and dry.  Psychiatric: She has a normal mood and affect.    MAU Course  Procedures    Assessment and Plan  25 y.o. G1P1001 with retained POC  Cytotec 800 mcg PV - rx given F/u in 2 weeks in office, precautions rev'd  Lil Lepage 11/09/2011, 8:29 PM

## 2011-11-09 NOTE — ED Notes (Signed)
Pt at [facility] on [address] to get prescriptions filled ([facility] closed now); verified prescription for ibuprofen and cyctotec to be filled; did not verify prescription for hydrocodone; pharmacist to inform pt that she needs to pick up hydrocodone at [place] tomorrow.

## 2011-11-09 NOTE — ED Notes (Deleted)
Pt in [facility] on [address] to get prescriptions filled; prescription for ibuprofen and cytotec to be filled at [facility]; prescription for hydrocodone not called in; pharmacist to inform pt she needs to pick up this med at [facility] tomorrow.

## 2012-02-22 ENCOUNTER — Encounter (HOSPITAL_COMMUNITY): Payer: Self-pay | Admitting: *Deleted

## 2012-02-22 ENCOUNTER — Inpatient Hospital Stay (HOSPITAL_COMMUNITY)
Admission: AD | Admit: 2012-02-22 | Discharge: 2012-02-23 | Disposition: A | Discharge status: Home / Self Care | Payer: Medicaid Other | Source: Ambulatory Visit | Attending: Obstetrics & Gynecology | Admitting: Obstetrics & Gynecology

## 2012-02-22 DIAGNOSIS — R109 Unspecified abdominal pain: Secondary | ICD-10-CM | POA: Insufficient documentation

## 2012-02-22 DIAGNOSIS — N912 Amenorrhea, unspecified: Secondary | ICD-10-CM | POA: Insufficient documentation

## 2012-02-22 LAB — URINALYSIS, ROUTINE W REFLEX MICROSCOPIC
Glucose, UA: NEGATIVE mg/dL
Hgb urine dipstick: NEGATIVE
Specific Gravity, Urine: 1.025 (ref 1.005–1.030)

## 2012-02-22 LAB — POCT PREGNANCY, URINE: Preg Test, Ur: NEGATIVE

## 2012-02-22 NOTE — MAU Note (Signed)
PT SAYS SHE HAD  DEPO SHOT IN JAN- DUE AGAIN IN April-  DOESN'T PLAN TO GET IT.    LAST SEX WAS  3-31.    HAS  NOT DONE HOME PREG  TEST.   HAS OFF/ON SPOTTING-  NONE NOW.  WANTS PREG TEST.

## 2012-02-22 NOTE — MAU Note (Signed)
Abdominal pain. Rates a 6/10 on th pain scale.

## 2012-02-23 NOTE — Discharge Instructions (Signed)
Abdominal Pain (Nonspecific) Your exam might not show the exact reason you have abdominal pain. Since there are many different causes of abdominal pain, another checkup and more tests may be needed. It is very important to follow up for lasting (persistent) or worsening symptoms. A possible cause of abdominal pain in any person who still has his or her appendix is acute appendicitis. Appendicitis is often hard to diagnose. Normal blood tests, urine tests, ultrasound, and CT scans do not completely rule out early appendicitis or other causes of abdominal pain. Sometimes, only the changes that happen over time will allow appendicitis and other causes of abdominal pain to be determined. Other potential problems that may require surgery may also take time to become more apparent. Because of this, it is important that you follow all of the instructions below. HOME CARE INSTRUCTIONS   Rest as much as possible.   Do not eat solid food until your pain is gone.   While adults or children have pain: A diet of water, weak decaffeinated tea, broth or bouillon, gelatin, oral rehydration solutions (ORS), frozen ice pops, or ice chips may be helpful.   When pain is gone in adults or children: Start a light diet (dry toast, crackers, applesauce, or white rice). Increase the diet slowly as long as it does not bother you. Eat no dairy products (including cheese and eggs) and no spicy, fatty, fried, or high-fiber foods.   Use no alcohol, caffeine, or cigarettes.   Take your regular medicines unless your caregiver told you not to.   Take any prescribed medicine as directed.   Only take over-the-counter or prescription medicines for pain, discomfort, or fever as directed by your caregiver. Do not give aspirin to children.  If your caregiver has given you a follow-up appointment, it is very important to keep that appointment. Not keeping the appointment could result in a permanent injury and/or lasting (chronic) pain  and/or disability. If there is any problem keeping the appointment, you must call to reschedule.  SEEK IMMEDIATE MEDICAL CARE IF:   Your pain is not gone in 24 hours.   Your pain becomes worse, changes location, or feels different.   You or your child has an oral temperature above 102 F (38.9 C), not controlled by medicine.   Your baby is older than 3 months with a rectal temperature of 102 F (38.9 C) or higher.   Your baby is 52 months old or younger with a rectal temperature of 100.4 F (38 C) or higher.   You have shaking chills.   You keep throwing up (vomiting) or cannot drink liquids.   There is blood in your vomit or you see blood in your bowel movements.   Your bowel movements become dark or black.   You have frequent bowel movements.   Your bowel movements stop (become blocked) or you cannot pass gas.   You have bloody, frequent, or painful urination.   You have yellow discoloration in the skin or whites of the eyes.   Your stomach becomes bloated or bigger.   You have dizziness or fainting.   You have chest or back pain.  MAKE SURE YOU:   Understand these instructions.   Will watch your condition.   Will get help right away if you are not doing well or get worse.  Document Released: 11/06/2005 Document Revised: 10/26/2011 Document Reviewed: 10/04/2009 [facility] Patient Information 2012 [place].

## 2012-02-23 NOTE — MAU Provider Note (Signed)
History     CSN: [id number]  Arrival date and time: 02/22/12 2120   First Provider Initiated Contact with Patient 02/23/12 0002      No chief complaint on file.  HPI Depo shot isn't due until 03/01/12 but wants to know if she is pregnant because she was having stomach cramps earlier. No pain now. Amenorrheic on Depo. Denies UTI or vaginitis sx.   OB History    Grav Para Term Preterm Abortions TAB SAB Ect Mult Living   1 1 1       1       Past Medical History  Diagnosis Date  . Anemia   . Chronic ulcerative colitis   . Blood transfusion   . Ulcerative colitis     Past Surgical History  Procedure Date  . Colon biopsy   . Tonsillectomy     Family History  Problem Relation Age of Onset  . Anesthesia problems Neg Hx   . Hypotension Neg Hx   . Malignant hyperthermia Neg Hx   . Pseudochol deficiency Neg Hx     History  Substance Use Topics  . Smoking status: Former Smoker -- 3 years    Types: Cigarettes    Quit date: 05/16/2010  . Smokeless tobacco: Not on file  . Alcohol Use: No    Allergies:  Allergies  Allergen Reactions  . Latex Rash    Prescriptions prior to admission  Medication Sig Dispense Refill  . acetaminophen (TYLENOL) 500 MG tablet Take 500 mg by mouth every 6 (six) hours as needed. For pain      . mesalamine (PENTASA) 500 MG CR capsule Take 1,000 mg by mouth daily.          Review of Systems  Gastrointestinal: Negative for heartburn, nausea, vomiting, abdominal pain, diarrhea and constipation.  Genitourinary: Negative for dysuria and flank pain.   Physical Exam   Blood pressure 122/85, pulse 79, temperature 97 F (36.1 C), temperature source Oral, resp. rate 20, height 4' 11"  (1.499 m), weight 57.664 kg (127 lb 2 oz), currently breastfeeding.  Physical Exam  Constitutional: She is oriented to person, place, and time. She appears well-developed and well-nourished. No distress.  HENT:  Head: Normocephalic.  Eyes: Pupils are equal, round,  and reactive to light.  Neck: Normal range of motion.  Cardiovascular: Normal rate.   Respiratory: Effort normal.  GI: Soft. There is no tenderness.  Neurological: She is alert and oriented to person, place, and time.  Skin: Skin is warm and dry.  Psychiatric: She has a normal mood and affect.     MAU Course  Procedures  Results for orders placed during the hospital encounter of 02/22/12 (from the past 24 hour(s))  URINALYSIS, ROUTINE W REFLEX MICROSCOPIC     Status: Normal   Collection Time   02/22/12 10:04 PM      Component Value Range   Color, Urine YELLOW  YELLOW    APPearance CLEAR  CLEAR    Specific Gravity, Urine 1.025  1.005 - 1.030    pH 6.0  5.0 - 8.0    Glucose, UA NEGATIVE  NEGATIVE (mg/dL)   Hgb urine dipstick NEGATIVE  NEGATIVE    Bilirubin Urine NEGATIVE  NEGATIVE    Ketones, ur NEGATIVE  NEGATIVE (mg/dL)   Protein, ur NEGATIVE  NEGATIVE (mg/dL)   Urobilinogen, UA 0.2  0.0 - 1.0 (mg/dL)   Nitrite NEGATIVE  NEGATIVE    Leukocytes, UA NEGATIVE  NEGATIVE   POCT PREGNANCY, URINE  Status: Normal   Collection Time   02/22/12 11:50 PM      Component Value Range   Preg Test, Ur NEGATIVE  NEGATIVE      Assessment and Plan  Reassured that she is not pregnant Amenorrhea due to Depo-Provera. Advised to get that shot  as scheduled this month.  Charitie Hinote 02/23/2012, 12:04 AM

## 2012-05-26 ENCOUNTER — Encounter (HOSPITAL_COMMUNITY): Payer: Self-pay | Admitting: Emergency Medicine

## 2012-05-26 ENCOUNTER — Emergency Department (HOSPITAL_COMMUNITY)
Admission: EM | Admit: 2012-05-26 | Discharge: 2012-05-26 | Disposition: A | Discharge status: Home / Self Care | Payer: Medicaid Other | Attending: Emergency Medicine | Admitting: Emergency Medicine

## 2012-05-26 DIAGNOSIS — Z87891 Personal history of nicotine dependence: Secondary | ICD-10-CM | POA: Insufficient documentation

## 2012-05-26 DIAGNOSIS — I10 Essential (primary) hypertension: Secondary | ICD-10-CM | POA: Insufficient documentation

## 2012-05-26 DIAGNOSIS — T7840XA Allergy, unspecified, initial encounter: Secondary | ICD-10-CM | POA: Insufficient documentation

## 2012-05-26 MED ORDER — DIPHENHYDRAMINE HCL 25 MG PO CAPS: 25.0000 mg | ORAL_CAPSULE | Freq: Four times a day (QID) | ORAL | Status: DC | PRN

## 2012-05-26 MED ORDER — FAMOTIDINE 20 MG PO TABS: 20.0000 mg | ORAL_TABLET | Freq: Two times a day (BID) | ORAL | Status: DC

## 2012-05-26 MED ORDER — FAMOTIDINE 20 MG PO TABS
20.0000 mg | ORAL_TABLET | Freq: Once | ORAL | Status: AC
  Administered 2012-05-26: 20 mg via ORAL
  Filled 2012-05-26: qty 1

## 2012-05-26 MED ORDER — DIPHENHYDRAMINE HCL 25 MG PO CAPS
25.0000 mg | ORAL_CAPSULE | Freq: Once | ORAL | Status: AC
  Administered 2012-05-26: 25 mg via ORAL
  Filled 2012-05-26: qty 1

## 2012-05-26 NOTE — ED Provider Notes (Signed)
History     CSN: [id number]  Arrival date & time 05/26/12  5625   First MD Initiated Contact with Patient 05/26/12 [phone]      No chief complaint on file.   (Consider location/radiation/quality/duration/timing/severity/associated sxs/prior treatment) Patient is a 26 y.o. female presenting with allergic reaction. The history is provided by the patient.  Allergic Reaction The primary symptoms are  rash. The primary symptoms do not include wheezing, shortness of breath, cough, nausea, vomiting, dizziness or angioedema. The current episode started 2 days ago. The problem has not changed since onset. The rash is associated with itching.  Significant symptoms also include eye redness, rhinorrhea and itching.  Pt stats she is having "runny nose, watery eyes, itching." States She was exposed to mold in her house. States she is staying with friends and moving out of the house soon. States looking for another place now. States no other exposure to new products, new soaps, new detergent. States "my father died from mold in his lungs, and my nephew and cousin had to be admitted for it." Pt denies fever, chills, cough, shortness of breath. No medications taken prior to the arrival.  Past Medical History  Diagnosis Date  . Anemia   . Chronic ulcerative colitis   . Blood transfusion   . Ulcerative colitis     Past Surgical History  Procedure Date  . Colon biopsy   . Tonsillectomy     Family History  Problem Relation Age of Onset  . Anesthesia problems Neg Hx   . Hypotension Neg Hx   . Malignant hyperthermia Neg Hx   . Pseudochol deficiency Neg Hx     History  Substance Use Topics  . Smoking status: Former Smoker -- 3 years    Types: Cigarettes    Quit date: 05/16/2010  . Smokeless tobacco: Not on file  . Alcohol Use: No    OB History    Grav Para Term Preterm Abortions TAB SAB Ect Mult Living   1 1 1       1       Review of Systems  Constitutional: Negative for fever and  chills.  HENT: Positive for congestion, facial swelling and rhinorrhea. Negative for trouble swallowing.   Eyes: Positive for redness and itching.  Respiratory: Negative for cough, chest tightness, shortness of breath and wheezing.   Cardiovascular: Negative.   Gastrointestinal: Negative.  Negative for nausea and vomiting.  Skin: Positive for itching and rash.  Neurological: Negative for dizziness and weakness.    Allergies  Latex  Home Medications  No current outpatient prescriptions on file.  BP 134/87  Pulse 80  Temp 98.5 F (36.9 C) (Oral)  Resp 18  SpO2 98%  LMP 05/19/2012  Physical Exam  Nursing note and vitals reviewed. Constitutional: She is oriented to person, place, and time. She appears well-developed and well-nourished. No distress.  HENT:  Head: Normocephalic.  Right Ear: External ear normal.  Left Ear: External ear normal.  Mouth/Throat: Oropharynx is clear and moist.       Clear rhinorrhea  Eyes: Conjunctivae are normal.       Eyes are watery bilaterally, no injection  Neck: Neck supple.  Cardiovascular: Normal rate, regular rhythm and normal heart sounds.   Pulmonary/Chest: Effort normal and breath sounds normal. No respiratory distress. She has no wheezes. She has no rales.  Musculoskeletal: She exhibits no edema.  Neurological: She is alert and oriented to person, place, and time.  Skin: Skin is warm and dry.  Psychiatric: She has a normal mood and affect.    ED Course  Procedures (including critical care time)  Pt with itching all over the body, watery itchy eyes, rhinorrhea, exposure to mold? Pt has no signs of symptoms of pneumonia. Has normal VS. NO distress. Suspect allergic reaction. Will treat with H1 H2 blockers. Follow up.    1. Allergic reaction       MDM          Renold Genta, PA 05/26/12 1653

## 2012-05-26 NOTE — ED Notes (Signed)
Patient reports that she found out she has been exposed to mildew two days ago.  Patient reports itchy/watery eyes, sneezing, and itching.  Patient denies shortness of breath and chest pain.

## 2012-05-26 NOTE — ED Notes (Signed)
Pt states they found black mildew in both bathroom in the place she lives. Pt states itching all over for 2 weeks. Denies new sheets, new clothes new detergents.

## 2012-05-28 NOTE — ED Provider Notes (Signed)
Medical screening examination/treatment/procedure(s) were performed by non-physician practitioner and as supervising physician I was immediately available for consultation/collaboration.  Leota Jacobsen, MD 05/28/12 [phone]

## 2012-06-05 ENCOUNTER — Inpatient Hospital Stay (HOSPITAL_COMMUNITY)
Admission: AD | Admit: 2012-06-05 | Discharge: 2012-06-05 | Disposition: A | Discharge status: Home / Self Care | Payer: Medicaid Other | Source: Ambulatory Visit | Attending: Family Medicine | Admitting: Family Medicine

## 2012-06-05 ENCOUNTER — Encounter (HOSPITAL_COMMUNITY): Payer: Self-pay | Admitting: *Deleted

## 2012-06-05 DIAGNOSIS — R109 Unspecified abdominal pain: Secondary | ICD-10-CM | POA: Insufficient documentation

## 2012-06-05 DIAGNOSIS — Z3202 Encounter for pregnancy test, result negative: Secondary | ICD-10-CM

## 2012-06-05 LAB — URINALYSIS, ROUTINE W REFLEX MICROSCOPIC
Leukocytes, UA: NEGATIVE
Nitrite: NEGATIVE
Specific Gravity, Urine: 1.02 (ref 1.005–1.030)
pH: 6 (ref 5.0–8.0)

## 2012-06-05 NOTE — MAU Note (Signed)
Pt states that she just wants to know if she is pregnant. States her last period was the end of june

## 2012-06-05 NOTE — MAU Provider Note (Signed)
History     CSN: [id number]  Arrival date and time: 06/05/12 1422   None     Chief Complaint  Patient presents with  . Abdominal Cramping   HPI Joyce Solomon 26 y.o. LMP 05-19-12.  Takes birth control pills but sometimes misses a pill.  Was visiting a friend here today and thought she should have a pregnancy test.  No physical complaints.  Denies having any problems.  Does not want to have an exam.  OB History    Grav Para Term Preterm Abortions TAB SAB Ect Mult Living   1 1 1       1       Past Medical History  Diagnosis Date  . Anemia   . Chronic ulcerative colitis   . Blood transfusion   . Ulcerative colitis     Past Surgical History  Procedure Date  . Colon biopsy   . Tonsillectomy     Family History  Problem Relation Age of Onset  . Anesthesia problems Neg Hx   . Hypotension Neg Hx   . Malignant hyperthermia Neg Hx   . Pseudochol deficiency Neg Hx     History  Substance Use Topics  . Smoking status: Former Smoker -- 3 years    Types: Cigarettes    Quit date: 05/16/2010  . Smokeless tobacco: Not on file  . Alcohol Use: No    Allergies:  Allergies  Allergen Reactions  . Latex Rash    No prescriptions prior to admission    Review of Systems  Gastrointestinal: Negative for nausea, vomiting and abdominal pain.  Genitourinary:       No vaginal discharge. No vaginal bleeding. No dysuria.   Physical Exam   Blood pressure 132/67, pulse 86, temperature 97.8 F (36.6 C), temperature source Oral, resp. rate 18, height 4' 11"  (1.499 m), weight 123 lb 8 oz (56.019 kg), last menstrual period 05/19/2012.  Physical Exam  Nursing note and vitals reviewed. Constitutional: She is oriented to person, place, and time. She appears well-developed and well-nourished.  HENT:  Head: Normocephalic.  Eyes: EOM are normal.  Neck: Neck supple.  Musculoskeletal: Normal range of motion.  Neurological: She is alert and oriented to person, place, and time.    Skin: Skin is warm and dry.  Psychiatric: She has a normal mood and affect.    MAU Course  Procedures Results for orders placed during the hospital encounter of 06/05/12 (from the past 24 hour(s))  URINALYSIS, ROUTINE W REFLEX MICROSCOPIC     Status: Normal   Collection Time   06/05/12  2:33 PM      Component Value Range   Color, Urine YELLOW  YELLOW   APPearance CLEAR  CLEAR   Specific Gravity, Urine 1.020  1.005 - 1.030   pH 6.0  5.0 - 8.0   Glucose, UA NEGATIVE  NEGATIVE mg/dL   Hgb urine dipstick NEGATIVE  NEGATIVE   Bilirubin Urine NEGATIVE  NEGATIVE   Ketones, ur NEGATIVE  NEGATIVE mg/dL   Protein, ur NEGATIVE  NEGATIVE mg/dL   Urobilinogen, UA 0.2  0.0 - 1.0 mg/dL   Nitrite NEGATIVE  NEGATIVE   Leukocytes, UA NEGATIVE  NEGATIVE  POCT PREGNANCY, URINE     Status: Normal   Collection Time   06/05/12  2:39 PM      Component Value Range   Preg Test, Ur NEGATIVE  NEGATIVE      Assessment and Plan  Negative pregnancy test  Plan Discussed at length taking  pills daily and using condoms if missing pills or for additional protection from pregnancy.  BURLESON,TERRI 06/05/2012, 3:24 PM

## 2012-06-06 NOTE — MAU Provider Note (Signed)
Chart reviewed and agree with management and plan.

## 2012-07-27 ENCOUNTER — Encounter (HOSPITAL_COMMUNITY): Payer: Self-pay

## 2012-07-27 ENCOUNTER — Inpatient Hospital Stay (HOSPITAL_COMMUNITY)
Admission: AD | Admit: 2012-07-27 | Discharge: 2012-07-27 | Disposition: A | Discharge status: Home / Self Care | Payer: Medicaid Other | Source: Ambulatory Visit | Attending: Obstetrics and Gynecology | Admitting: Obstetrics and Gynecology

## 2012-07-27 ENCOUNTER — Inpatient Hospital Stay (HOSPITAL_COMMUNITY): Payer: Medicaid Other

## 2012-07-27 DIAGNOSIS — A499 Bacterial infection, unspecified: Secondary | ICD-10-CM | POA: Insufficient documentation

## 2012-07-27 DIAGNOSIS — R109 Unspecified abdominal pain: Secondary | ICD-10-CM

## 2012-07-27 DIAGNOSIS — O2390 Unspecified genitourinary tract infection in pregnancy, unspecified trimester: Secondary | ICD-10-CM | POA: Insufficient documentation

## 2012-07-27 DIAGNOSIS — O99891 Other specified diseases and conditions complicating pregnancy: Secondary | ICD-10-CM | POA: Insufficient documentation

## 2012-07-27 DIAGNOSIS — B9689 Other specified bacterial agents as the cause of diseases classified elsewhere: Secondary | ICD-10-CM

## 2012-07-27 DIAGNOSIS — O210 Mild hyperemesis gravidarum: Secondary | ICD-10-CM | POA: Insufficient documentation

## 2012-07-27 DIAGNOSIS — O26899 Other specified pregnancy related conditions, unspecified trimester: Secondary | ICD-10-CM

## 2012-07-27 DIAGNOSIS — R51 Headache: Secondary | ICD-10-CM | POA: Insufficient documentation

## 2012-07-27 DIAGNOSIS — O9989 Other specified diseases and conditions complicating pregnancy, childbirth and the puerperium: Secondary | ICD-10-CM

## 2012-07-27 DIAGNOSIS — N760 Acute vaginitis: Secondary | ICD-10-CM

## 2012-07-27 LAB — URINALYSIS, ROUTINE W REFLEX MICROSCOPIC
Hgb urine dipstick: NEGATIVE
Leukocytes, UA: NEGATIVE
Protein, ur: NEGATIVE mg/dL
Urobilinogen, UA: 0.2 mg/dL (ref 0.0–1.0)

## 2012-07-27 LAB — WET PREP, GENITAL

## 2012-07-27 LAB — POCT PREGNANCY, URINE: Preg Test, Ur: POSITIVE — AB

## 2012-07-27 LAB — HCG, QUANTITATIVE, PREGNANCY: hCG, Beta Chain, Quant, S: 69921 m[IU]/mL — ABNORMAL HIGH (ref <5)

## 2012-07-27 MED ORDER — CONCEPT OB 130-92.4-1 MG PO CAPS: 1.0000 | ORAL_CAPSULE | Freq: Every day | ORAL | Status: DC

## 2012-07-27 MED ORDER — METRONIDAZOLE 500 MG PO TABS: 500.0000 mg | ORAL_TABLET | Freq: Two times a day (BID) | ORAL | Status: AC

## 2012-07-27 NOTE — MAU Provider Note (Signed)
Chief Complaint: Abdominal Pain and Headache   First Provider Initiated Contact with Patient 07/27/12 2019      SUBJECTIVE HPI: Joyce Solomon is a 26 y.o. [id number] at 52w6dby uncertain LMP who presents to MAU reporting cramping. Denies vaginal bleeding, fever, chills.  Past Medical History  Diagnosis Date  . Anemia   . Chronic ulcerative colitis   . Blood transfusion   . Ulcerative colitis    OB History    Grav Para Term Preterm Abortions TAB SAB Ect Mult Living   2 1 1       1      # Outc Date GA Lbr Len/2nd Wgt Sex Del Anes PTL Lv   1 TRM 12/12 [id number]:42 / 00:33 5lb7.8oz(2.489kg) F SVD EPI  Yes   Comments: no dysmorphic features; apnea secondary to tight nuchal cord and acute vascular depletion; IUGR not evident clinically; see consult note.   2 CUR              Past Surgical History  Procedure Date  . Colon biopsy   . Tonsillectomy    History   Social History  . Marital Status: Single    Spouse Name: N/A    Number of Children: N/A  . Years of Education: N/A   Occupational History  . Not on file.   Social History Main Topics  . Smoking status: Former Smoker -- 3 years    Types: Cigarettes    Quit date: 05/16/2010  . Smokeless tobacco: Not on file  . Alcohol Use: No  . Drug Use: No  . Sexually Active: Yes    Birth Control/ Protection: Injection   Other Topics Concern  . Not on file   Social History Narrative  . No narrative on file   No current facility-administered medications on file prior to encounter.   No current outpatient prescriptions on file prior to encounter.   Allergies  Allergen Reactions  . Latex Rash    ROS: Pertinent items in HPI  OBJECTIVE Blood pressure 118/74, pulse 96, temperature 98.7 F (37.1 C), temperature source Oral, resp. rate 16, height 4' 10.5" (1.486 m), weight 58.242 kg (128 lb 6.4 oz), last menstrual period 06/16/2012. GENERAL: Well-developed, well-nourished female in no acute distress.  HEENT:  Normocephalic HEART: normal rate RESP: normal effort ABDOMEN: Soft, non-tender EXTREMITIES: Nontender, no edema NEURO: Alert and oriented SPECULUM EXAM: Small amount of this, white, malodorousdischarge. No blood, cervix clean. BIMANUAL: Uterus normal size, NT. No adnexal masses or tenderness.   LAB RESULTS Results for orders placed during the hospital encounter of 07/27/12 (from the past 24 hour(s))  POCT PREGNANCY, URINE     Status: Abnormal   Collection Time   07/27/12  8:00 PM      Component Value Range   Preg Test, Ur POSITIVE (*) NEGATIVE    IMAGING   ED COURSE  ASSESSMENT 1. Abdominal pain complicating pregnancy, antepartum   2. BV (bacterial vaginosis)    PLAN Discharge home Follow-up Information    [facility] with [facility].      Follow up with [facility](As needed if symptoms worsen)    Contact information:   [phone]        Medication List     As of 08/02/2012 10:51 AM    START taking these medications         CONCEPT OB 130-92.4-1 MG Caps  Take 1 tablet by mouth daily.      metroNIDAZOLE 500 MG tablet   Commonly known as: FLAGYL   Take 1 tablet (500 mg total) by mouth 2 (two) times daily.      CONTINUE taking these medications         sucralfate 1 G tablet   Commonly known as: CARAFATE          Where to get your medications    These are the prescriptions that you need to pick up.   You may get these medications from any pharmacy.         CONCEPT OB 130-92.4-1 MG Caps   metroNIDAZOLE 500 MG tablet            [place], [place] 07/27/2012  8:13 PM

## 2012-07-27 NOTE — MAU Note (Signed)
Thinks LMP was at end of July.

## 2012-07-27 NOTE — MAU Note (Signed)
Lower abdominal cramping x1 week. N/v and headache since last week. Denies vaginal bleeding. Positive home pregnancy test.

## 2012-07-29 LAB — GC/CHLAMYDIA PROBE AMP, GENITAL: Chlamydia, DNA Probe: NEGATIVE

## 2012-08-05 NOTE — MAU Provider Note (Signed)
Agree with above note.  Joyce Solomon 08/05/2012 2:23 PM

## 2012-08-16 ENCOUNTER — Encounter (HOSPITAL_COMMUNITY): Payer: Self-pay

## 2012-08-19 NOTE — H&P (Signed)
Joyce Solomon is an 26 y.o. female. [id number] who presents with a finding of a 9 week fetal demise on routine [place] last week.  SHe has had no bleeding thus far.  Her blood type is Rh positive.  She has elected a suction D&E.  Pertinent Gynecological History: Menstrual History:  Patient's last menstrual period was 06/16/2012.    Past Medical History  Diagnosis Date  . Anemia   . Chronic ulcerative colitis   . Blood transfusion   . Ulcerative colitis     Past Surgical History  Procedure Date  . Colon biopsy   . Tonsillectomy     Family History  Problem Relation Age of Onset  . Anesthesia problems Neg Hx   . Hypotension Neg Hx   . Malignant hyperthermia Neg Hx   . Pseudochol deficiency Neg Hx     Social History:  reports that she quit smoking about 2 years ago. Her smoking use included Cigarettes. She quit after 3 years of use. She does not have any smokeless tobacco history on file. She reports that she does not drink alcohol or use illicit drugs.  Allergies:  Allergies  Allergen Reactions  . Latex Rash    gloves    No prescriptions prior to admission    ROS  Last menstrual period 06/16/2012. Physical Exam  Constitutional: She is oriented to person, place, and time. She appears well-developed and well-nourished.  Cardiovascular: Normal rate and regular rhythm.   Respiratory: Effort normal.  GI: Soft. Bowel sounds are normal.  Genitourinary: Vagina normal and uterus normal.       9 week size  Neurological: She is alert and oriented to person, place, and time.  Psychiatric: She has a normal mood and affect. Her behavior is normal.    No results found for this or any previous visit (from the past 24 hour(s)).  No results found.  Assessment/Plan: Pt counseled on options and desires to proceed with D&E.  We discussed bleeding and uterine perforation and she will pretreat with Cytotec 463mg prior to OR to reduce risk.  RLogan Bores9/30/2013, 5:38  PM

## 2012-08-20 ENCOUNTER — Encounter (HOSPITAL_COMMUNITY): Payer: Self-pay | Admitting: Anesthesiology

## 2012-08-20 NOTE — Anesthesia Preprocedure Evaluation (Addendum)
Anesthesia Evaluation  Patient identified by MRN, date of birth, ID band Patient awake    Reviewed: Allergy & Precautions, H&P , NPO status , Patient's Chart, lab work & pertinent test results  Airway Mallampati: II TM Distance: >3 FB Neck ROM: Full    Dental No notable dental hx.    Pulmonary former smoker,  breath sounds clear to auscultation  Pulmonary exam normal       Cardiovascular negative cardio ROS  Rhythm:Regular Rate:Normal     Neuro/Psych negative neurological ROS  negative psych ROS   GI/Hepatic Neg liver ROS, Ulcerative Colitis   Endo/Other  Hyperthyroidism   Renal/GU negative Renal ROS  negative genitourinary   Musculoskeletal negative musculoskeletal ROS (+)   Abdominal   Peds  Hematology  (+) Blood dyscrasia, anemia ,   Anesthesia Other Findings   Reproductive/Obstetrics Missed Ab                          Anesthesia Physical Anesthesia Plan  ASA: II  Anesthesia Plan: MAC   Post-op Pain Management:    Induction: Intravenous  Airway Management Planned: Natural Airway  Additional Equipment:   Intra-op Plan:   Post-operative Plan:   Informed Consent: I have reviewed the patients History and Physical, chart, labs and discussed the procedure including the risks, benefits and alternatives for the proposed anesthesia with the patient or authorized representative who has indicated his/her understanding and acceptance.   Dental advisory given  Plan Discussed with: CRNA, Anesthesiologist and Surgeon  Anesthesia Plan Comments:         Anesthesia Quick Evaluation

## 2012-08-21 ENCOUNTER — Encounter (HOSPITAL_COMMUNITY): Payer: Self-pay | Admitting: *Deleted

## 2012-08-21 ENCOUNTER — Encounter (HOSPITAL_COMMUNITY): Payer: Self-pay | Admitting: Anesthesiology

## 2012-08-21 ENCOUNTER — Ambulatory Visit (HOSPITAL_COMMUNITY)
Admission: RE | Admit: 2012-08-21 | Discharge: 2012-08-21 | Disposition: A | Discharge status: Home / Self Care | Payer: Medicaid Other | Source: Ambulatory Visit | Attending: Obstetrics and Gynecology | Admitting: Obstetrics and Gynecology

## 2012-08-21 ENCOUNTER — Encounter (HOSPITAL_COMMUNITY)
Admission: RE | Disposition: A | Discharge status: Home / Self Care | Payer: Self-pay | Source: Ambulatory Visit | Attending: Obstetrics and Gynecology

## 2012-08-21 ENCOUNTER — Ambulatory Visit (HOSPITAL_COMMUNITY): Payer: Medicaid Other | Admitting: Anesthesiology

## 2012-08-21 DIAGNOSIS — O021 Missed abortion: Secondary | ICD-10-CM | POA: Insufficient documentation

## 2012-08-21 HISTORY — PX: DILATION AND EVACUATION: SHX1459

## 2012-08-21 LAB — CBC
Hemoglobin: 13.1 g/dL (ref 12.0–15.0)
MCHC: 34.1 g/dL (ref 30.0–36.0)
RDW: 14.4 % (ref 11.5–15.5)
WBC: 10.7 10*3/uL — ABNORMAL HIGH (ref 4.0–10.5)

## 2012-08-21 SURGERY — DILATION AND EVACUATION, UTERUS
Anesthesia: Monitor Anesthesia Care | Site: Vagina | Wound class: Clean Contaminated

## 2012-08-21 MED ORDER — PROPOFOL 10 MG/ML IV EMUL
INTRAVENOUS | Status: DC | PRN
  Administered 2012-08-21 (×5): 50 mg via INTRAVENOUS

## 2012-08-21 MED ORDER — LIDOCAINE HCL 1 % IJ SOLN
INTRAMUSCULAR | Status: DC | PRN
  Administered 2012-08-21: 20 mL

## 2012-08-21 MED ORDER — LIDOCAINE HCL (CARDIAC) 20 MG/ML IV SOLN
INTRAVENOUS | Status: DC | PRN
  Administered 2012-08-21: 40 mg via INTRAVENOUS

## 2012-08-21 MED ORDER — LACTATED RINGERS IV SOLN
INTRAVENOUS | Status: DC
  Administered 2012-08-21: 1000 mL via INTRAVENOUS

## 2012-08-21 MED ORDER — ONDANSETRON HCL 4 MG/2ML IJ SOLN
INTRAMUSCULAR | Status: DC | PRN
  Administered 2012-08-21: 4 mg via INTRAVENOUS

## 2012-08-21 MED ORDER — LACTATED RINGERS IV SOLN
INTRAVENOUS | Status: DC
  Administered 2012-08-21: 07:00:00 via INTRAVENOUS

## 2012-08-21 MED ORDER — ONDANSETRON HCL 4 MG/2ML IJ SOLN
INTRAMUSCULAR | Status: AC
  Filled 2012-08-21: qty 2

## 2012-08-21 MED ORDER — FENTANYL CITRATE 0.05 MG/ML IJ SOLN: 25.0000 ug | INTRAMUSCULAR | Status: DC | PRN

## 2012-08-21 MED ORDER — FENTANYL CITRATE 0.05 MG/ML IJ SOLN
INTRAMUSCULAR | Status: AC
  Filled 2012-08-21: qty 5

## 2012-08-21 MED ORDER — MIDAZOLAM HCL 5 MG/5ML IJ SOLN
INTRAMUSCULAR | Status: DC | PRN
  Administered 2012-08-21: 2 mg via INTRAVENOUS

## 2012-08-21 MED ORDER — KETOROLAC TROMETHAMINE 30 MG/ML IJ SOLN
INTRAMUSCULAR | Status: DC | PRN
  Administered 2012-08-21: 30 mg via INTRAVENOUS

## 2012-08-21 MED ORDER — PROPOFOL 10 MG/ML IV EMUL
INTRAVENOUS | Status: AC
  Filled 2012-08-21: qty 20

## 2012-08-21 MED ORDER — LIDOCAINE HCL (CARDIAC) 20 MG/ML IV SOLN
INTRAVENOUS | Status: AC
  Filled 2012-08-21: qty 5

## 2012-08-21 MED ORDER — MIDAZOLAM HCL 2 MG/2ML IJ SOLN
INTRAMUSCULAR | Status: AC
  Filled 2012-08-21: qty 2

## 2012-08-21 MED ORDER — HYDROCODONE-ACETAMINOPHEN 5-500 MG PO TABS: 1.0000 | ORAL_TABLET | Freq: Four times a day (QID) | ORAL | Status: DC | PRN

## 2012-08-21 MED ORDER — FENTANYL CITRATE 0.05 MG/ML IJ SOLN
INTRAMUSCULAR | Status: DC | PRN
  Administered 2012-08-21: 50 ug via INTRAVENOUS
  Administered 2012-08-21: 100 ug via INTRAVENOUS

## 2012-08-21 MED ORDER — METOCLOPRAMIDE HCL 5 MG/ML IJ SOLN: 10.0000 mg | Freq: Once | INTRAMUSCULAR | Status: DC | PRN

## 2012-08-21 SURGICAL SUPPLY — 21 items
CATH ROBINSON RED A/P 16FR (CATHETERS) ×2 IMPLANT
CLOTH BEACON ORANGE TIMEOUT ST (SAFETY) ×2 IMPLANT
DECANTER SPIKE VIAL GLASS SM (MISCELLANEOUS) ×2 IMPLANT
DRESSING TELFA 8X3 (GAUZE/BANDAGES/DRESSINGS) ×2 IMPLANT
GLOVE BIO SURGEON STRL SZ 6.5 (GLOVE) ×4 IMPLANT
GOWN STRL REIN XL XLG (GOWN DISPOSABLE) ×4 IMPLANT
KIT BERKELEY 1ST TRIMESTER 3/8 (MISCELLANEOUS) ×2 IMPLANT
NDL SPNL 22GX3.5 QUINCKE BK (NEEDLE) ×1 IMPLANT
NEEDLE SPNL 22GX3.5 QUINCKE BK (NEEDLE) ×2 IMPLANT
NS IRRIG 1000ML POUR BTL (IV SOLUTION) ×2 IMPLANT
PACK VAGINAL MINOR WOMEN LF (CUSTOM PROCEDURE TRAY) ×2 IMPLANT
PAD OB MATERNITY 4.3X12.25 (PERSONAL CARE ITEMS) ×2 IMPLANT
PAD PREP 24X48 CUFFED NSTRL (MISCELLANEOUS) ×2 IMPLANT
SET BERKELEY SUCTION TUBING (SUCTIONS) ×2 IMPLANT
SYR CONTROL 10ML LL (SYRINGE) ×2 IMPLANT
TOWEL OR 17X24 6PK STRL BLUE (TOWEL DISPOSABLE) ×4 IMPLANT
VACURETTE 10 RIGID CVD (CANNULA) IMPLANT
VACURETTE 7MM CVD STRL WRAP (CANNULA) IMPLANT
VACURETTE 8 RIGID CVD (CANNULA) ×1 IMPLANT
VACURETTE 9 RIGID CVD (CANNULA) IMPLANT
WATER STERILE IRR 1000ML POUR (IV SOLUTION) ×2 IMPLANT

## 2012-08-21 NOTE — Transfer of Care (Signed)
Immediate Anesthesia Transfer of Care Note  Patient: Joyce Solomon  Procedure(s) Performed: Procedure(s) (LRB) with comments: DILATATION AND EVACUATION (N/A)  Patient Location: PACU  Anesthesia Type: MAC  Level of Consciousness: awake, alert  and oriented  Airway & Oxygen Therapy: Patient Spontanous Breathing  Post-op Assessment: Report given to PACU RN and Post -op Vital signs reviewed and stable  Post vital signs: stable  Complications: No apparent anesthesia complications

## 2012-08-21 NOTE — Progress Notes (Signed)
Patient ID: Joyce Solomon, female   DOB: 1986-04-20, 26 y.o.   MRN: [id number] Per pt no changes in dictated H&P.  Brief exam WNL.

## 2012-08-21 NOTE — Brief Op Note (Signed)
08/21/2012  8:07 AM  PATIENT:  [name]  26 y.o. female  PRE-OPERATIVE DIAGNOSIS:  missed abortion  POST-OPERATIVE DIAGNOSIS:  missed abortion  PROCEDURE:  Procedure(s) (LRB) with comments: DILATATION AND EVACUATION (N/A)  SURGEON:  Surgeon(s) and Role:    * Logan Bores, MD - Primary   ANESTHESIA:   local and IV sedation  EBL:  Total I/O In: 900 [I.V.:900] Out: 200 [Urine:100; Blood:100]  BLOOD ADMINISTERED:none  DRAINS: none   LOCAL MEDICATIONS USED:  LIDOCAINE   SPECIMEN:  Products of conception DISPOSITION OF SPECIMEN:  PATHOLOGY  COUNTS:  YES  TOURNIQUET:  * No tourniquets in log *  DICTATION: .Dragon Dictation  PLAN OF CARE: Discharge to home after PACU  PATIENT DISPOSITION:  PACU - hemodynamically stable.

## 2012-08-21 NOTE — Op Note (Signed)
Operative note  Preoperative diagnosis Missed abortion at 9+ weeks  Postoperative diagnosis Same  Procedure Suction D&E  Surgeon Dr. Paula Compton  Anesthesia MAC with a paracervical block  Findings The uterus was approximately 9 weeks in size with a moderate amount of products of conception obtained  Fluids Estimated blood loss 50 cc Urine output 50 cc straight catheter prior to procedure IV fluids 800 cc LR  Specimen Products of conception sent to pathology  Procedure note After informed consent was obtained patient was taken to the operating room where MAC anesthesia was obtained without difficulty. An appropriate time out was performed and she was prepped and draped in the normal sterile fashion in the dorsal lithotomy position. A speculum was placed within the vagina the cervix identified and injected with approximately 2 cc of 1% lidocaine on the anterior lip. This was then grasped with a single-tooth tenaculum an additional 9 cc each was placed at 2 and 10:00 for a paracervical block. The patient was noted to have some light bleeding from her Cytotec administration and the uterus easily sounded to 8-9 cm. The Pratt dilators easily passed and a size 8 suction curette was introduced into the uterine fundus. A moderate amount of products of conception were obtained all multiple passes and suction discontinued. A sharp curettage was performed with a small amount of tissue still felt in the fundus therefore one additional pass was made with the suction and this was removed. Additional sharp curettage revealed no further tissue palpable. All instruments and sponges were removed from the cervix and a small amount of bleeding at the tenaculum site controlled with silver nitrate. There is no active bleeding noted at the end of the procedure and the patient was awakened and taken to the recovery room in good condition.

## 2012-08-23 ENCOUNTER — Encounter (HOSPITAL_COMMUNITY): Payer: Self-pay | Admitting: Obstetrics and Gynecology

## 2012-08-27 NOTE — Anesthesia Postprocedure Evaluation (Signed)
Anesthesia Post Note  Patient: Joyce Solomon  Procedure(s) Performed: Procedure(s) (LRB): DILATATION AND EVACUATION (N/A)  Anesthesia type: MAC  Patient location: PACU  Post pain: Pain level controlled  Post assessment: Post-op Vital signs reviewed  Last Vitals:  Filed Vitals:   08/21/12 0900  BP: 106/56  Pulse: 68  Temp: 36.9 C  Resp: 20    Post vital signs: Reviewed  Level of consciousness: sedated  Complications: No apparent anesthesia complications

## 2012-09-22 ENCOUNTER — Encounter (HOSPITAL_COMMUNITY): Payer: Self-pay | Admitting: *Deleted

## 2012-09-22 ENCOUNTER — Emergency Department (INDEPENDENT_AMBULATORY_CARE_PROVIDER_SITE_OTHER)
Admission: EM | Admit: 2012-09-22 | Discharge: 2012-09-22 | Disposition: A | Discharge status: Home / Self Care | Payer: Medicaid Other | Source: Home / Self Care | Attending: Emergency Medicine | Admitting: Emergency Medicine

## 2012-09-22 DIAGNOSIS — H00019 Hordeolum externum unspecified eye, unspecified eyelid: Secondary | ICD-10-CM

## 2012-09-22 MED ORDER — TOBRAMYCIN 0.3 % OP SOLN: 1.0000 [drp] | Freq: Four times a day (QID) | OPHTHALMIC | Status: AC

## 2012-09-22 NOTE — ED Provider Notes (Signed)
History     CSN: [id number]  Arrival date & time 09/22/12  5809   First MD Initiated Contact with Patient 09/22/12 [phone]      Chief Complaint  Patient presents with  . Eye Problem    (Consider location/radiation/quality/duration/timing/severity/associated sxs/prior treatment) HPI Comments: Patient presents to urgent care complaining of discomfort irritation burning and swelling of her left lower eyelid for the last 4 days. She thinks she got this infection after she was cleaning out her face neck she touch her left eye by accident. Denies any fevers, chills changes in vision or eye trauma.  Patient is a 26 y.o. female presenting with eye problem. The history is provided by the patient.  Eye Problem  This is a new problem. The problem occurs constantly. The problem has not changed since onset.The left eye is affected.There was no injury mechanism. There is history of trauma to the eye. Associated symptoms include eye redness. Pertinent negatives include no numbness, no blurred vision, no decreased vision, no discharge, no photophobia, no tingling, no weakness and no itching. She has tried nothing for the symptoms.    Past Medical History  Diagnosis Date  . Anemia   . Chronic ulcerative colitis   . Blood transfusion   . Ulcerative colitis     Past Surgical History  Procedure Date  . Colon biopsy   . Tonsillectomy   . Dilation and evacuation 08/21/2012    Procedure: DILATATION AND EVACUATION;  Surgeon: Logan Bores, MD;  Location: [facility];  Service: Gynecology;  Laterality: N/A;    Family History  Problem Relation Age of Onset  . Anesthesia problems Neg Hx   . Hypotension Neg Hx   . Malignant hyperthermia Neg Hx   . Pseudochol deficiency Neg Hx     History  Substance Use Topics  . Smoking status: Former Smoker -- 3 years    Types: Cigarettes    Quit date: 05/16/2010  . Smokeless tobacco: Not on file  . Alcohol Use: No    OB History    Grav Para Term Preterm  Abortions TAB SAB Ect Mult Living   2 1 1       1       Review of Systems  Constitutional: Negative for activity change.  Eyes: Positive for redness and itching. Negative for blurred vision, photophobia, pain, discharge and visual disturbance.  Skin: Negative for itching.  Neurological: Negative for dizziness, tingling, weakness, numbness and headaches.    Allergies  Latex  Home Medications   Current Outpatient Rx  Name  Route  Sig  Dispense  Refill  . HYDROCODONE-ACETAMINOPHEN 5-500 MG PO TABS   Oral   Take 1 tablet by mouth every 6 (six) hours as needed for pain.   30 tablet   0   . MESALAMINE ER 500 MG PO CPCR   Oral   Take 1,000 mg by mouth 2 (two) times daily.          . TOBRAMYCIN SULFATE 0.3 % OP SOLN   Left Eye   Place 1 drop into the left eye every 6 (six) hours.   5 mL   0     BP 110/78  Pulse 91  Temp 97.8 F (36.6 C) (Oral)  Resp 16  SpO2 100%  LMP 06/16/2012  Breastfeeding? Unknown  Physical Exam  Nursing note and vitals reviewed. Constitutional: She appears well-developed and well-nourished.  Eyes: Conjunctivae normal are normal. Pupils are equal, round, and reactive to light. No foreign bodies  found. Right eye exhibits no discharge. Left eye exhibits hordeolum. Left eye exhibits no chemosis and no discharge. No foreign body present in the left eye. No scleral icterus.    Pulmonary/Chest: Effort normal.  Neurological: She is alert.  Skin: No rash noted. No erythema.    ED Course  Procedures (including critical care time)  Labs Reviewed - No data to display No results found.   1. Stye       MDM  Internal hordeolum of the left lower eyelid in early stages upper left eyelid. Patient instructed to use warm compresses and to start with tobramycin atomic antibiotic drops. Advised to return if no improvement is noted after 3-5 days. Or sooner if any worsening symptoms despite antibiotic use. She acknowledges, agrees with treatment plan and  follow-up care as necessary.        Rosana Hoes, MD 09/22/12 1011

## 2012-09-22 NOTE — ED Notes (Signed)
Pt  Reports  sev  Days  Ago  She    Was  Cleaning  Out   Her  Fish  Tank   And  She  Touched  Her l  Eye  By  Accident  Has  Has  Some  Redness   Swelling  And  Pain in  The  Affected  Eye

## 2012-10-01 ENCOUNTER — Inpatient Hospital Stay (HOSPITAL_COMMUNITY)
Admission: AD | Admit: 2012-10-01 | Discharge: 2012-10-01 | Disposition: A | Discharge status: Home / Self Care | Payer: Medicaid Other | Source: Ambulatory Visit | Attending: Obstetrics & Gynecology | Admitting: Obstetrics & Gynecology

## 2012-10-01 ENCOUNTER — Encounter (HOSPITAL_COMMUNITY): Payer: Self-pay | Admitting: *Deleted

## 2012-10-01 DIAGNOSIS — N912 Amenorrhea, unspecified: Secondary | ICD-10-CM | POA: Insufficient documentation

## 2012-10-01 DIAGNOSIS — R102 Pelvic and perineal pain: Secondary | ICD-10-CM

## 2012-10-01 DIAGNOSIS — N949 Unspecified condition associated with female genital organs and menstrual cycle: Secondary | ICD-10-CM | POA: Insufficient documentation

## 2012-10-01 DIAGNOSIS — R109 Unspecified abdominal pain: Secondary | ICD-10-CM | POA: Insufficient documentation

## 2012-10-01 LAB — URINALYSIS, ROUTINE W REFLEX MICROSCOPIC
Glucose, UA: NEGATIVE mg/dL
Ketones, ur: NEGATIVE mg/dL
Leukocytes, UA: NEGATIVE
pH: 7.5 (ref 5.0–8.0)

## 2012-10-01 NOTE — MAU Note (Signed)
Had a D&E following a miscarriage on September  7th; no period since Sept 7 th; c/o abdominal pain but no bleeding;

## 2012-10-01 NOTE — MAU Provider Note (Signed)
Attestation of Attending Supervision of Advanced Practitioner (CNM/NP): Evaluation and management procedures were performed by the Advanced Practitioner under my supervision and collaboration.  I have reviewed the Advanced Practitioner's note and chart, and I agree with the management and plan.  Verita Schneiders, MD, [place] Attending [place], [place]

## 2012-10-01 NOTE — MAU Provider Note (Signed)
  History     CSN: [id number]  Arrival date and time: 10/01/12 1209   None     Chief Complaint  Patient presents with  . Abdominal Pain   HPI  Pt is a G2P1001 here with reports of pelvic cramping.  Patient's last menstrual period was 07/27/2012.  Pt denies abnormal vaginal discharge.  States, "I just want a blood test to make sure I'm not pregnant".   Past Medical History  Diagnosis Date  . Anemia   . Chronic ulcerative colitis   . Blood transfusion   . Ulcerative colitis     Past Surgical History  Procedure Date  . Colon biopsy   . Tonsillectomy   . Dilation and evacuation 08/21/2012    Procedure: DILATATION AND EVACUATION;  Surgeon: Logan Bores, MD;  Location: [facility];  Service: Gynecology;  Laterality: N/A;    Family History  Problem Relation Age of Onset  . Anesthesia problems Neg Hx   . Hypotension Neg Hx   . Malignant hyperthermia Neg Hx   . Pseudochol deficiency Neg Hx   . Other Neg Hx     History  Substance Use Topics  . Smoking status: Former Smoker -- 3 years    Types: Cigarettes    Quit date: 05/16/2010  . Smokeless tobacco: Not on file  . Alcohol Use: No    Allergies:  Allergies  Allergen Reactions  . Latex Rash    gloves    No prescriptions prior to admission    Review of Systems  Gastrointestinal: Positive for nausea and abdominal pain (cramping).  All other systems reviewed and are negative.   Physical Exam   Blood pressure 108/60, pulse 72, temperature 97.8 F (36.6 C), temperature source Oral, resp. rate 16, height 4' 11"  (1.499 m), weight 58.514 kg (129 lb), last menstrual period 07/27/2012, not currently breastfeeding.  Physical Exam  Constitutional: She is oriented to person, place, and time. She appears well-developed and well-nourished. No distress.  HENT:  Head: Normocephalic.  Neck: Normal range of motion. Neck supple.  Cardiovascular: Normal rate and regular rhythm.   Respiratory: Effort normal and breath  sounds normal. No respiratory distress.  GI: Soft. There is no tenderness.  Genitourinary: No bleeding around the vagina.  Neurological: She is alert and oriented to person, place, and time. She has normal reflexes.  Skin: Skin is warm and dry.    MAU Course  Procedures  Results for orders placed during the hospital encounter of 10/01/12 (from the past 24 hour(s))  POCT PREGNANCY, URINE     Status: Normal   Collection Time   10/01/12  1:44 PM      Component Value Range   Preg Test, Ur NEGATIVE  NEGATIVE  HCG, QUANTITATIVE, PREGNANCY     Status: Normal   Collection Time   10/01/12  3:48 PM      Component Value Range   hCG, Beta Chain, Quant, S 1  <5 mIU/mL     Assessment and Plan  Pelvic Pain Amenorrhea  Plan: DC to home F/U if no menses in 4 weeks.  [facility] 10/01/2012, 4:22 PM

## 2012-12-20 ENCOUNTER — Emergency Department (INDEPENDENT_AMBULATORY_CARE_PROVIDER_SITE_OTHER)
Admission: EM | Admit: 2012-12-20 | Discharge: 2012-12-20 | Disposition: A | Discharge status: Home / Self Care | Payer: Medicaid Other | Source: Home / Self Care | Attending: Family Medicine | Admitting: Family Medicine

## 2012-12-20 ENCOUNTER — Encounter (HOSPITAL_COMMUNITY): Payer: Self-pay | Admitting: *Deleted

## 2012-12-20 DIAGNOSIS — A088 Other specified intestinal infections: Secondary | ICD-10-CM

## 2012-12-20 DIAGNOSIS — A084 Viral intestinal infection, unspecified: Secondary | ICD-10-CM

## 2012-12-20 MED ORDER — ONDANSETRON HCL 4 MG PO TABS: 4.0000 mg | ORAL_TABLET | Freq: Four times a day (QID) | ORAL | Status: DC

## 2012-12-20 MED ORDER — ONDANSETRON 4 MG PO TBDP: 8.0000 mg | ORAL_TABLET | Freq: Once | ORAL | Status: DC

## 2012-12-20 NOTE — ED Notes (Signed)
C/o abdominal cramps, nausea and vomited x 1 yesterday.  Had diarrhea x 2 yesterday also.  Has been getting hot but does not know if she had a fever.  Aching in her lower back.

## 2012-12-20 NOTE — ED Provider Notes (Signed)
History     CSN: [id number]  Arrival date & time 12/20/12  60   First MD Initiated Contact with Patient 12/20/12 1816      Chief Complaint  Patient presents with  . Abdominal Cramping  . Emesis    (Consider location/radiation/quality/duration/timing/severity/associated sxs/prior treatment) Patient is a 27 y.o. female presenting with cramps and vomiting. The history is provided by the patient.  Abdominal Cramping The primary symptoms of the illness include nausea, vomiting and diarrhea. The primary symptoms of the illness do not include abdominal pain, fever, hematemesis or hematochezia. The current episode started yesterday. The onset of the illness was sudden. The problem has not changed since onset. The patient states that she believes she is currently not pregnant. The patient has had a change in bowel habit. Symptoms associated with the illness do not include chills or heartburn. Significant associated medical issues include inflammatory bowel disease. Associated medical issues comments: under control with meds.for years..  Emesis  Associated symptoms include diarrhea. Pertinent negatives include no abdominal pain, no chills and no fever.    Past Medical History  Diagnosis Date  . Anemia   . Chronic ulcerative colitis   . Blood transfusion   . Ulcerative colitis     Past Surgical History  Procedure Date  . Colon biopsy   . Tonsillectomy   . Dilation and evacuation 08/21/2012    Procedure: DILATATION AND EVACUATION;  Surgeon: Logan Bores, MD;  Location: [facility];  Service: Gynecology;  Laterality: N/A;    Family History  Problem Relation Age of Onset  . Anesthesia problems Neg Hx   . Hypotension Neg Hx   . Malignant hyperthermia Neg Hx   . Pseudochol deficiency Neg Hx   . Other Neg Hx     History  Substance Use Topics  . Smoking status: Former Smoker -- 3 years    Types: Cigarettes    Quit date: 05/16/2010  . Smokeless tobacco: Not on file  . Alcohol  Use: No    OB History    Grav Para Term Preterm Abortions TAB SAB Ect Mult Living   2 1 1       1       Review of Systems  Constitutional: Positive for appetite change. Negative for fever and chills.  HENT: Negative.   Gastrointestinal: Positive for nausea, vomiting and diarrhea. Negative for heartburn, abdominal pain, blood in stool, hematochezia, abdominal distention, anal bleeding and hematemesis.  Genitourinary: Negative.     Allergies  Latex  Home Medications   Current Outpatient Rx  Name  Route  Sig  Dispense  Refill  . MESALAMINE 1.2 G PO TBEC   Oral   Take 1,200 mg by mouth 4 (four) times daily.         [name] ONDANSETRON HCL 4 MG PO TABS   Oral   Take 1 tablet (4 mg total) by mouth every 6 (six) hours. As needed for n/v.   8 tablet   0     BP 124/83  Pulse 77  Temp 99.8 F (37.7 C) (Oral)  Resp 16  SpO2 95%  LMP 07/27/2012  Breastfeeding? Unknown  Physical Exam  Nursing note and vitals reviewed. Constitutional: She is oriented to person, place, and time. She appears well-developed and well-nourished. No distress.  HENT:  Head: Normocephalic.  Mouth/Throat: Oropharynx is clear and moist.  Neck: Normal range of motion. Neck supple.  Cardiovascular: Normal rate, normal heart sounds and intact distal pulses.   Pulmonary/Chest: Breath sounds  normal.  Abdominal: Soft. Bowel sounds are normal.  Lymphadenopathy:    She has no cervical adenopathy.  Neurological: She is alert and oriented to person, place, and time.  Skin: Skin is warm and dry.    ED Course  Procedures (including critical care time)  Labs Reviewed - No data to display No results found.   1. Gastroenteritis and colitis, viral       MDM          Billy Fischer, MD 12/20/12 1901

## 2012-12-24 ENCOUNTER — Emergency Department (HOSPITAL_COMMUNITY): Payer: Medicaid Other

## 2012-12-24 ENCOUNTER — Encounter (HOSPITAL_COMMUNITY): Payer: Self-pay

## 2012-12-24 ENCOUNTER — Emergency Department (HOSPITAL_COMMUNITY)
Admission: EM | Admit: 2012-12-24 | Discharge: 2012-12-24 | Disposition: A | Discharge status: Home / Self Care | Payer: Medicaid Other | Attending: Emergency Medicine | Admitting: Emergency Medicine

## 2012-12-24 DIAGNOSIS — Z862 Personal history of diseases of the blood and blood-forming organs and certain disorders involving the immune mechanism: Secondary | ICD-10-CM | POA: Insufficient documentation

## 2012-12-24 DIAGNOSIS — IMO0002 Reserved for concepts with insufficient information to code with codable children: Secondary | ICD-10-CM | POA: Insufficient documentation

## 2012-12-24 DIAGNOSIS — S8391XA Sprain of unspecified site of right knee, initial encounter: Secondary | ICD-10-CM

## 2012-12-24 DIAGNOSIS — W108XXA Fall (on) (from) other stairs and steps, initial encounter: Secondary | ICD-10-CM | POA: Insufficient documentation

## 2012-12-24 DIAGNOSIS — Z87891 Personal history of nicotine dependence: Secondary | ICD-10-CM | POA: Insufficient documentation

## 2012-12-24 DIAGNOSIS — Y939 Activity, unspecified: Secondary | ICD-10-CM | POA: Insufficient documentation

## 2012-12-24 DIAGNOSIS — W19XXXA Unspecified fall, initial encounter: Secondary | ICD-10-CM

## 2012-12-24 DIAGNOSIS — Y929 Unspecified place or not applicable: Secondary | ICD-10-CM | POA: Insufficient documentation

## 2012-12-24 DIAGNOSIS — S8990XA Unspecified injury of unspecified lower leg, initial encounter: Secondary | ICD-10-CM | POA: Insufficient documentation

## 2012-12-24 DIAGNOSIS — W010XXA Fall on same level from slipping, tripping and stumbling without subsequent striking against object, initial encounter: Secondary | ICD-10-CM | POA: Insufficient documentation

## 2012-12-24 DIAGNOSIS — S301XXA Contusion of abdominal wall, initial encounter: Secondary | ICD-10-CM | POA: Insufficient documentation

## 2012-12-24 DIAGNOSIS — K5190 Ulcerative colitis, unspecified, without complications: Secondary | ICD-10-CM | POA: Insufficient documentation

## 2012-12-24 MED ORDER — MELOXICAM 7.5 MG PO TABS: 7.5000 mg | ORAL_TABLET | Freq: Every day | ORAL | Status: DC

## 2012-12-24 NOTE — ED Notes (Signed)
Patient presents with left lower back, right thigh and right knee pain after falling down her stairs earlier this evening. Denies LOC or hitting head. Denies neck pain. MOE x 4, ambulatory with no assistance.

## 2012-12-24 NOTE — ED Provider Notes (Signed)
History  Scribed for Jeannett Senior, PA-C/ Sharyon Cable, MD, the patient was seen in room TR05C/TR05C. This chart was scribed by Truddie Coco. The patient's care started at 8:41 PM  CSN: [id number]  Arrival date & time 12/24/12  1945   First MD Initiated Contact with Patient 12/24/12 2033      Chief Complaint  Patient presents with  . Fall  . Leg Pain  . Back Pain     The history is provided by the patient. No language interpreter was used.   Joyce Solomon is a 27 y.o. female who presents to the Emergency Department complaining of right leg pain and left lower back pain after sliding down a few stairs earlier today.  She reports she hit her abdomen on the stairs.  Walking makes the leg pain worse.  She denies neck pain, trouble walking, hitting her head, or LOC.  She has taken nothing for the pain.  Past Medical History  Diagnosis Date  . Anemia   . Chronic ulcerative colitis   . Blood transfusion   . Ulcerative colitis     Past Surgical History  Procedure Date  . Colon biopsy   . Tonsillectomy   . Dilation and evacuation 08/21/2012    Procedure: DILATATION AND EVACUATION;  Surgeon: Logan Bores, MD;  Location: [facility];  Service: Gynecology;  Laterality: N/A;    Family History  Problem Relation Age of Onset  . Anesthesia problems Neg Hx   . Hypotension Neg Hx   . Malignant hyperthermia Neg Hx   . Pseudochol deficiency Neg Hx   . Other Neg Hx     History  Substance Use Topics  . Smoking status: Former Smoker -- 3 years    Types: Cigarettes    Quit date: 05/16/2010  . Smokeless tobacco: Not on file  . Alcohol Use: No    OB History    Grav Para Term Preterm Abortions TAB SAB Ect Mult Living   2 1 1       1       Review of Systems  Constitutional: Negative for fever and chills.  HENT: Negative for neck pain.   Respiratory: Negative for shortness of breath.   Gastrointestinal: Negative for nausea and vomiting.  Musculoskeletal: Positive  for back pain (left lower back pain) and arthralgias (right leg pain).  Skin: Negative for rash and wound.  Neurological: Negative for weakness.  All other systems reviewed and are negative.    Allergies  Latex  Home Medications   Current Outpatient Rx  Name  Route  Sig  Dispense  Refill  . MESALAMINE 1.2 G PO TBEC   Oral   Take 1,200 mg by mouth 4 (four) times daily.           BP 122/67  Pulse 84  Temp 98.2 F (36.8 C) (Oral)  Resp 14  SpO2 99%  LMP 12/04/2012  Physical Exam  Nursing note and vitals reviewed. Constitutional: She is oriented to person, place, and time. She appears well-developed and well-nourished. No distress.  HENT:  Head: Normocephalic and atraumatic.  Eyes: EOM are normal.  Neck: Neck supple. No tracheal deviation present.  Cardiovascular: Normal rate.   Pulmonary/Chest: Effort normal. No respiratory distress.  Abdominal: Bowel sounds are normal. There is no tenderness.       No CVA tenderness  Musculoskeletal: Normal range of motion.       Spine is non tender.  Right patella tendon intact.  Medial joint tenderness.  No pain to lateral joint.  Tender over patella and patella tendon.  Full ROM of the right knee with no pain.  Negative posterior and anterior drawer signs.  No laxity with medial or lateral stress.    Neurological: She is alert and oriented to person, place, and time.  Skin: Skin is warm and dry.  Psychiatric: She has a normal mood and affect. Her behavior is normal.    ED Course  Procedures   DIAGNOSTIC STUDIES: Oxygen Saturation is 99% on room air, normal by my interpretation.    COORDINATION OF CARE:  8:48 PM Will order xray of right knee.  Pt understands and agrees.   8:49 PM Ordered: DG Knee Complete 4 Views Right 10:08 PM Discussed images with pt.  Will prescribe antiinflammatory.  Pt understands and agrees.   Labs Reviewed - No data to display Dg Knee Complete 4 Views Right  12/24/2012  *RADIOLOGY REPORT*  Clinical  Data: [place] down multiple steps.  Pain in the knee.  RIGHT KNEE - COMPLETE 4+ VIEW  Comparison: None.  Findings: There is no evidence for acute fracture or dislocation. No soft tissue foreign body or gas identified.  No joint effusion.  IMPRESSION: Negative exam.   Original Report Authenticated By: Nolon Nations, M.D.      1. Sprain of right knee   2. Fall   3. Abdominal contusion       MDM  Pt with right knee pain after a fall. X-rays negative. joit stable. No clear evidence of ligamentous or tendon injury. Will treat with ice, elevated, knee sleeve. Follow up with orthopeids or PCP. Pt ambulating with no problems.   I personally performed the services described in this documentation, which was scribed in my presence. The recorded information has been reviewed and is accurate.        Renold Genta, PA 12/25/12 0030

## 2012-12-24 NOTE — ED Notes (Signed)
Pt reports slipping and falling backwards on carpeted stairs. Denies hitting head. States she landed on right shin area. Denies back, neck, or hip pain.

## 2012-12-26 NOTE — ED Provider Notes (Signed)
Medical screening examination/treatment/procedure(s) were performed by non-physician practitioner and as supervising physician I was immediately available for consultation/collaboration.   Sharyon Cable, MD 12/26/12 [phone]

## 2013-02-22 ENCOUNTER — Emergency Department (INDEPENDENT_AMBULATORY_CARE_PROVIDER_SITE_OTHER)
Admission: EM | Admit: 2013-02-22 | Discharge: 2013-02-22 | Disposition: A | Discharge status: Home / Self Care | Payer: Medicaid Other | Source: Home / Self Care | Attending: Emergency Medicine | Admitting: Emergency Medicine

## 2013-02-22 ENCOUNTER — Encounter (HOSPITAL_COMMUNITY): Payer: Self-pay | Admitting: Emergency Medicine

## 2013-02-22 DIAGNOSIS — M436 Torticollis: Secondary | ICD-10-CM

## 2013-02-22 DIAGNOSIS — H00016 Hordeolum externum left eye, unspecified eyelid: Secondary | ICD-10-CM

## 2013-02-22 DIAGNOSIS — H00019 Hordeolum externum unspecified eye, unspecified eyelid: Secondary | ICD-10-CM

## 2013-02-22 MED ORDER — METHOCARBAMOL 500 MG PO TABS: 500.0000 mg | ORAL_TABLET | Freq: Three times a day (TID) | ORAL | Status: DC

## 2013-02-22 MED ORDER — ERYTHROMYCIN 5 MG/GM OP OINT: TOPICAL_OINTMENT | OPHTHALMIC | Status: DC

## 2013-02-22 MED ORDER — MELOXICAM 15 MG PO TABS: 15.0000 mg | ORAL_TABLET | Freq: Every day | ORAL | Status: DC

## 2013-02-22 NOTE — ED Provider Notes (Signed)
Chief Complaint:   Chief Complaint  Patient presents with  . Stye  . Neck Pain    History of Present Illness:   Joyce Solomon is a 27 year old female who presents today with a stye on her left lower lid and neck pain. The stye is been present for 2 days. It's tender to touch but not draining any pus. She denies any redness of the eye, discharge, or changes in her vision. The neck pain has been going on for 3 days. She denies any injury to the neck. She just woke up with her neck hurting on the left side. It hurts to rotate to the left and to bend to the left but not the right side. There is no radiation the pain down the arms, no numbness, tingling, or weakness. She denies any chest pain or shortness of breath.  Review of Systems:  Other than noted above, the patient denies any of the following symptoms: Systemic:  No fever, chills, sweats, fatigue, or weight loss. Eye:  No redness, eye pain, photophobia, discharge, blurred vision, or diplopia. ENT:  No nasal congestion, rhinorrhea, or sore throat. Lymphatic:  No adenopathy. Skin:  No rash or pruritis.  [place]:  Past medical history, family history, social history, meds, and allergies were reviewed.  She is also colitis but is not taking any medication for it now.  Physical Exam:   Vital signs:  BP 110/59  Pulse 68  Temp(Src) 97.8 F (36.6 C) (Oral)  Resp 16  SpO2 100%  LMP 02/22/2013  Breastfeeding? No General:  Alert and in no distress. Eye:  On the mid left lower eyelid there is a tender papule at the eyelid margin externally. There was no collection of pus. The conjunctiva appears normal, PERRLA, full EOMs. ENT:  TMs and canals clear.  Nasal mucosa normal.  No intra-oral lesions, mucous membranes moist, pharynx clear. Neck:  There was tenderness to palpation over the left trapezius ridge. The neck has a full range of motion to the right but limited range of motion to the left with pain. Neurological exam: Normal DTRs, muscle  strength, and sensation upper extremities. Skin:  Clear, warm and dry.  Assessment:  The primary encounter diagnosis was Hordeolum externum of left eye. A diagnosis of Torticollis was also pertinent to this visit.  Plan:   1.  The following meds were prescribed:   New Prescriptions   ERYTHROMYCIN OPHTHALMIC OINTMENT    Apply a small amount to sty 4 times daily   MELOXICAM (MOBIC) 15 MG TABLET    Take 1 tablet (15 mg total) by mouth daily.   METHOCARBAMOL (ROBAXIN) 500 MG TABLET    Take 1 tablet (500 mg total) by mouth 3 (three) times daily.   2.  The patient was instructed in symptomatic care and handouts were given. 3.  The patient was told to return if becoming worse in any way, to followup with Dr. Lavell Anchors if no better in one week, and given some red flag symptoms such as changes in vision, fever, or neurological symptoms that would indicate earlier return.     Harden Mo, MD 02/22/13 [phone]

## 2013-02-22 NOTE — ED Notes (Signed)
Patient has a stye on left lower lid, history of the same. Patient also has pain in left neck.  Onset yesterday for symptoms.

## 2013-02-28 ENCOUNTER — Encounter (HOSPITAL_COMMUNITY): Payer: Self-pay | Admitting: *Deleted

## 2013-02-28 ENCOUNTER — Emergency Department (HOSPITAL_COMMUNITY)
Admission: EM | Admit: 2013-02-28 | Discharge: 2013-02-28 | Disposition: A | Discharge status: Home / Self Care | Payer: Medicaid Other | Attending: Emergency Medicine | Admitting: Emergency Medicine

## 2013-02-28 DIAGNOSIS — Z862 Personal history of diseases of the blood and blood-forming organs and certain disorders involving the immune mechanism: Secondary | ICD-10-CM | POA: Insufficient documentation

## 2013-02-28 DIAGNOSIS — Z3202 Encounter for pregnancy test, result negative: Secondary | ICD-10-CM | POA: Insufficient documentation

## 2013-02-28 DIAGNOSIS — R110 Nausea: Secondary | ICD-10-CM | POA: Insufficient documentation

## 2013-02-28 DIAGNOSIS — Z8719 Personal history of other diseases of the digestive system: Secondary | ICD-10-CM | POA: Insufficient documentation

## 2013-02-28 DIAGNOSIS — R197 Diarrhea, unspecified: Secondary | ICD-10-CM | POA: Insufficient documentation

## 2013-02-28 DIAGNOSIS — Z87891 Personal history of nicotine dependence: Secondary | ICD-10-CM | POA: Insufficient documentation

## 2013-02-28 LAB — COMPREHENSIVE METABOLIC PANEL
BUN: 11 mg/dL (ref 6–23)
CO2: 21 mEq/L (ref 19–32)
Calcium: 8.9 mg/dL (ref 8.4–10.5)
GFR calc Af Amer: >90 mL/min (ref >90)
GFR calc non Af Amer: >90 mL/min (ref >90)
Glucose, Bld: 98 mg/dL (ref 70–99)
Total Protein: 6.9 g/dL (ref 6.0–8.3)

## 2013-02-28 LAB — CBC WITH DIFFERENTIAL/PLATELET
Eosinophils Absolute: 0 10*3/uL (ref 0.0–0.7)
Eosinophils Relative: 1 % (ref 0–5)
HCT: 41.3 % (ref 36.0–46.0)
Hemoglobin: 14.6 g/dL (ref 12.0–15.0)
Lymphs Abs: 0.8 10*3/uL (ref 0.7–4.0)
MCH: 31.1 pg (ref 26.0–34.0)
MCV: 88.1 fL (ref 78.0–100.0)
Monocytes Absolute: 0.6 10*3/uL (ref 0.1–1.0)
Monocytes Relative: 11 % (ref 3–12)
Platelets: 224 10*3/uL (ref 150–400)
RBC: 4.69 MIL/uL (ref 3.87–5.11)

## 2013-02-28 LAB — URINALYSIS, ROUTINE W REFLEX MICROSCOPIC
Bilirubin Urine: NEGATIVE
Hgb urine dipstick: NEGATIVE
Ketones, ur: NEGATIVE mg/dL
Nitrite: NEGATIVE
pH: 6 (ref 5.0–8.0)

## 2013-02-28 MED ORDER — ONDANSETRON HCL 4 MG PO TABS: 4.0000 mg | ORAL_TABLET | Freq: Four times a day (QID) | ORAL | Status: DC

## 2013-02-28 MED ORDER — SODIUM CHLORIDE 0.9 % IV BOLUS (SEPSIS)
1000.0000 mL | Freq: Once | INTRAVENOUS | Status: AC
  Administered 2013-02-28: 1000 mL via INTRAVENOUS

## 2013-02-28 MED ORDER — ONDANSETRON HCL 4 MG/2ML IJ SOLN
4.0000 mg | Freq: Once | INTRAMUSCULAR | Status: AC
  Administered 2013-02-28: 4 mg via INTRAVENOUS
  Filled 2013-02-28: qty 2

## 2013-02-28 NOTE — ED Notes (Signed)
Patient given discharge instructions for nausea and diarrhea. rx for zofran given. adivsed to follow up with primary care in two days or to return to this department if condition worsens. Patient voiced understanding of all instructions and had no further questions. Patient ambulated to front lobby without difficulty.

## 2013-02-28 NOTE — ED Notes (Signed)
ED doctor at bedside with patient.

## 2013-02-28 NOTE — ED Notes (Signed)
Patient given crackers and drink, verified with md.

## 2013-02-28 NOTE — ED Notes (Signed)
Patient states she is having more abdominal cramping after eating crackers and drink. Informed patient to not eat any more at this time. Fluids are still infusing. Call light at bedside. Will continue to monitor.

## 2013-02-28 NOTE — ED Provider Notes (Signed)
History     CSN: [id number]  Arrival date & time 02/28/13  1924   First MD Initiated Contact with Patient 02/28/13 2015      Chief Complaint  Patient presents with  . Abdominal Pain    (Consider location/radiation/quality/duration/timing/severity/associated sxs/prior treatment) HPI Pt presents with c/o diffuse abdominal pain and cramping with watery diarrhea.  Symptoms started early this morning.  Nausea but no vomiting.  She has multiple family members with similar symptoms,  They all ate hot dogs together last night.  No fever/chills.  No dysuria or vaginal bleeding.  She denies seeing blood in her stool.   She states pain is resolved briefly after passing stool.  She has not had any treatment prior to arrival.  There are no other associated systemic symptoms, there are no other alleviating or modifying factors.   Past Medical History  Diagnosis Date  . Anemia   . Chronic ulcerative colitis   . Blood transfusion   . Ulcerative colitis     Past Surgical History  Procedure Laterality Date  . Colon biopsy    . Tonsillectomy    . Dilation and evacuation  08/21/2012    Procedure: DILATATION AND EVACUATION;  Surgeon: Logan Bores, MD;  Location: [facility];  Service: Gynecology;  Laterality: N/A;    Family History  Problem Relation Age of Onset  . Anesthesia problems Neg Hx   . Hypotension Neg Hx   . Malignant hyperthermia Neg Hx   . Pseudochol deficiency Neg Hx   . Other Neg Hx     History  Substance Use Topics  . Smoking status: Former Smoker -- 3 years    Types: Cigarettes    Quit date: 05/16/2010  . Smokeless tobacco: Not on file  . Alcohol Use: No    OB History   Grav Para Term Preterm Abortions TAB SAB Ect Mult Living   2 1 1       1       Review of Systems ROS reviewed and all otherwise negative except for mentioned in HPI  Allergies  Latex  Home Medications   Current Outpatient Rx  Name  Route  Sig  Dispense  Refill  . methocarbamol (ROBAXIN)  500 MG tablet   Oral   Take 1 tablet (500 mg total) by mouth 3 (three) times daily.   30 tablet   0   . ondansetron (ZOFRAN) 4 MG tablet   Oral   Take 1 tablet (4 mg total) by mouth every 6 (six) hours.   12 tablet   0     BP 100/55  Pulse 105  Temp(Src) 99 F (37.2 C) (Oral)  Resp 16  SpO2 100%  LMP 02/22/2013 Vitals reviewed Physical Exam Physical Examination: General appearance - alert, well appearing, and in no distress Mental status - alert, oriented to person, place, and time Eyes - no scleral icterus, no conjunctival injection Mouth - mucous membranes moist, pharynx normal without lesions Chest - clear to auscultation, no wheezes, rales or rhonchi, symmetric air entry Heart - normal rate, regular rhythm, normal S1, S2, no murmurs, rubs, clicks or gallops Abdomen - soft, nontender, nondistended, no masses or organomegaly, nabs Extremities - peripheral pulses normal, no pedal edema, no clubbing or cyanosis Skin - normal coloration and turgor, no rashes  ED Course  Procedures (including critical care time)  Labs Reviewed  COMPREHENSIVE METABOLIC PANEL - Abnormal; Notable for the following:    Total Bilirubin 0.2 (*)    All other  components within normal limits  URINALYSIS, ROUTINE W REFLEX MICROSCOPIC  PREGNANCY, URINE  CBC WITH DIFFERENTIAL   No results found.   1. Nausea   2. Diarrhea       MDM  Pt presenting with c/o cramping abdominal pain and diarrhea, multiple family members with simialr illness.  No fever, abdominal exam benign.  Pt given IV hydration and nausea meds, is drinking liquids in the ED.  Doubt other intraabdominal cause for her symptoms due to benign exam and no significant lab abnormalities.  Suspect viral gastroenteritis, or possible food poisoning.  Discharged with strict return precautions.  Pt agreeable with plan.        Threasa Beards, MD 03/01/13 [phone]

## 2013-02-28 NOTE — ED Notes (Signed)
The pt has been ill since this am with abd pain and diarrhea no n v lmp last week.  Everyone in her household is ill

## 2013-03-18 ENCOUNTER — Encounter (HOSPITAL_COMMUNITY): Payer: Self-pay | Admitting: Nurse Practitioner

## 2013-03-18 ENCOUNTER — Emergency Department (HOSPITAL_COMMUNITY)
Admission: EM | Admit: 2013-03-18 | Discharge: 2013-03-18 | Disposition: A | Discharge status: Home / Self Care | Payer: Medicaid Other | Attending: Emergency Medicine | Admitting: Emergency Medicine

## 2013-03-18 DIAGNOSIS — Z862 Personal history of diseases of the blood and blood-forming organs and certain disorders involving the immune mechanism: Secondary | ICD-10-CM | POA: Insufficient documentation

## 2013-03-18 DIAGNOSIS — Z87891 Personal history of nicotine dependence: Secondary | ICD-10-CM | POA: Insufficient documentation

## 2013-03-18 DIAGNOSIS — Z8719 Personal history of other diseases of the digestive system: Secondary | ICD-10-CM | POA: Insufficient documentation

## 2013-03-18 DIAGNOSIS — Z3202 Encounter for pregnancy test, result negative: Secondary | ICD-10-CM | POA: Insufficient documentation

## 2013-03-18 DIAGNOSIS — R109 Unspecified abdominal pain: Secondary | ICD-10-CM

## 2013-03-18 DIAGNOSIS — Z91040 Latex allergy status: Secondary | ICD-10-CM | POA: Insufficient documentation

## 2013-03-18 DIAGNOSIS — Z79899 Other long term (current) drug therapy: Secondary | ICD-10-CM | POA: Insufficient documentation

## 2013-03-18 LAB — COMPREHENSIVE METABOLIC PANEL
AST: 13 U/L (ref 0–37)
Albumin: 3.7 g/dL (ref 3.5–5.2)
Alkaline Phosphatase: 77 U/L (ref 39–117)
CO2: 26 mEq/L (ref 19–32)
Chloride: 106 mEq/L (ref 96–112)
Creatinine, Ser: 0.52 mg/dL (ref 0.50–1.10)
GFR calc non Af Amer: >90 mL/min (ref >90)
Potassium: 4.2 mEq/L (ref 3.5–5.1)
Total Bilirubin: 0.2 mg/dL — ABNORMAL LOW (ref 0.3–1.2)

## 2013-03-18 LAB — URINALYSIS, MICROSCOPIC ONLY
Bilirubin Urine: NEGATIVE
Glucose, UA: NEGATIVE mg/dL
Ketones, ur: NEGATIVE mg/dL
Protein, ur: NEGATIVE mg/dL
pH: 6.5 (ref 5.0–8.0)

## 2013-03-18 LAB — CBC WITH DIFFERENTIAL/PLATELET
Basophils Absolute: 0 10*3/uL (ref 0.0–0.1)
Basophils Relative: 0 % (ref 0–1)
HCT: 40.2 % (ref 36.0–46.0)
Hemoglobin: 14.5 g/dL (ref 12.0–15.0)
Lymphocytes Relative: 39 % (ref 12–46)
Monocytes Absolute: 1.1 10*3/uL — ABNORMAL HIGH (ref 0.1–1.0)
Monocytes Relative: 13 % — ABNORMAL HIGH (ref 3–12)
Neutro Abs: 3.8 10*3/uL (ref 1.7–7.7)
Neutrophils Relative %: 45 % (ref 43–77)
RDW: 13.3 % (ref 11.5–15.5)
WBC: 8.5 10*3/uL (ref 4.0–10.5)

## 2013-03-18 LAB — PREGNANCY, URINE: Preg Test, Ur: NEGATIVE

## 2013-03-18 MED ORDER — SENNOSIDES-DOCUSATE SODIUM 8.6-50 MG PO TABS: 2.0000 | ORAL_TABLET | Freq: Every day | ORAL | Status: AC

## 2013-03-18 MED ORDER — PREDNISONE 20 MG PO TABS
60.0000 mg | ORAL_TABLET | ORAL | Status: AC
  Administered 2013-03-18: 60 mg via ORAL
  Filled 2013-03-18: qty 3

## 2013-03-18 MED ORDER — PREDNISONE 20 MG PO TABS: 60.0000 mg | ORAL_TABLET | Freq: Every day | ORAL | Status: AC

## 2013-03-18 NOTE — ED Provider Notes (Signed)
History     CSN: [id number]  Arrival date & time 03/18/13  1845   First MD Initiated Contact with Patient 03/18/13 1918      Chief Complaint  Patient presents with  . Abdominal Pain    (Consider location/radiation/quality/duration/timing/severity/associated sxs/prior treatment) HPI Patient presents with ongoing abdominal pain associated with defecation. She has a history of ulcerative colitis, though current steroid use, no recent gastroenterology visit. Over the past weeks she has had pain with defecation.  Initially she had diffuse generalized pain as well, but this is resolved. She now complains of lower abdominal crampy pain with defecation.  No non defecation related pain. No ongoing fever, chills, vomiting, anorexia, chest pain, dyspnea, other focal complaints. The patient states that she typically has episodes of constipation and lower abdominal pain with ulcerative colitis flares, and requests steroids.  Past Medical History  Diagnosis Date  . Anemia   . Chronic ulcerative colitis   . Blood transfusion   . Ulcerative colitis     Past Surgical History  Procedure Laterality Date  . Colon biopsy    . Tonsillectomy    . Dilation and evacuation  08/21/2012    Procedure: DILATATION AND EVACUATION;  Surgeon: Logan Bores, MD;  Location: [facility];  Service: Gynecology;  Laterality: N/A;    Family History  Problem Relation Age of Onset  . Anesthesia problems Neg Hx   . Hypotension Neg Hx   . Malignant hyperthermia Neg Hx   . Pseudochol deficiency Neg Hx   . Other Neg Hx     History  Substance Use Topics  . Smoking status: Former Smoker -- 3 years    Types: Cigarettes    Quit date: 05/16/2010  . Smokeless tobacco: Not on file  . Alcohol Use: No    OB History   Grav Para Term Preterm Abortions TAB SAB Ect Mult Living   2 1 1       1       Review of Systems  All other systems reviewed and are negative.    Allergies  Latex  Home Medications    Current Outpatient Rx  Name  Route  Sig  Dispense  Refill  . mesalamine (LIALDA) 1.2 G EC tablet   Oral   Take 4,800 mg by mouth daily with breakfast.         . methocarbamol (ROBAXIN) 500 MG tablet   Oral   Take 500 mg by mouth 3 (three) times daily as needed (for muscle relaxer).         . ondansetron (ZOFRAN) 4 MG tablet   Oral   Take 4 mg by mouth every 6 (six) hours as needed for nausea.           BP 111/59  Pulse 88  Temp(Src) 98.1 F (36.7 C) (Oral)  Resp 16  SpO2 100%  LMP 02/22/2013  Physical Exam  Nursing note and vitals reviewed. Constitutional: She is oriented to person, place, and time. She appears well-developed and well-nourished. No distress.  HENT:  Head: Normocephalic and atraumatic.  Eyes: Conjunctivae and EOM are normal.  Cardiovascular: Normal rate and regular rhythm.   Pulmonary/Chest: Effort normal and breath sounds normal. No stridor. No respiratory distress.  Abdominal: Soft. Bowel sounds are normal. She exhibits no distension. There is no tenderness. There is no rebound and no guarding.  Musculoskeletal: She exhibits no edema.  Neurological: She is alert and oriented to person, place, and time. No cranial nerve deficit.  Skin: Skin  is warm and dry.  Psychiatric: She has a normal mood and affect.    ED Course  Procedures (including critical care time)  Labs Reviewed  CBC WITH DIFFERENTIAL - Abnormal; Notable for the following:    MCHC 36.1 (*)    Monocytes Relative 13 (*)    Monocytes Absolute 1.1 (*)    All other components within normal limits  COMPREHENSIVE METABOLIC PANEL - Abnormal; Notable for the following:    Glucose, Bld 113 (*)    Total Bilirubin 0.2 (*)    All other components within normal limits  LIPASE, BLOOD  URINALYSIS, MICROSCOPIC ONLY  PREGNANCY, URINE   No results found.   No diagnosis found.    MDM  This generally well-appearing young female with history of ulcerative colitis now presents with a  request for steroids given her ongoing abdominal pain with defecation.  On exam she is in no distress with a soft abdomen, no fever, no evidence of peritonitis.  The patient's request was accommodated, and her laboratory evaluation is unremarkable.  We discussed, at length, the need for gastroenterology management of her condition, but absent distress, or the aforementioned concerning findings, she was appropriate for discharge.    Carmin Muskrat, MD 03/18/13 2102

## 2013-03-18 NOTE — ED Notes (Signed)
States she has hx ulcerative colitis and has been having painful bowel movements that are sometimes bloody for past month. denies n/v or fevers. A&Ox4, resp e/u

## 2013-03-18 NOTE — ED Notes (Signed)
Pt states she has Ulcerative Colitis and is having a flare up, states she would like prescription for Prednisone.  Pt states her bowel movements have been painful and sometimes bloody for the past month.

## 2013-03-23 IMAGING — US US OB UMBILICAL ART DOPPLER
1 series · 13 of 17 positions shown · non-contrast
Comparison: none

[Series 1: us fetal bpp w/o nonstress · non-contrast · 17 acquisitions, 13 frames shown]
[im 1/17]
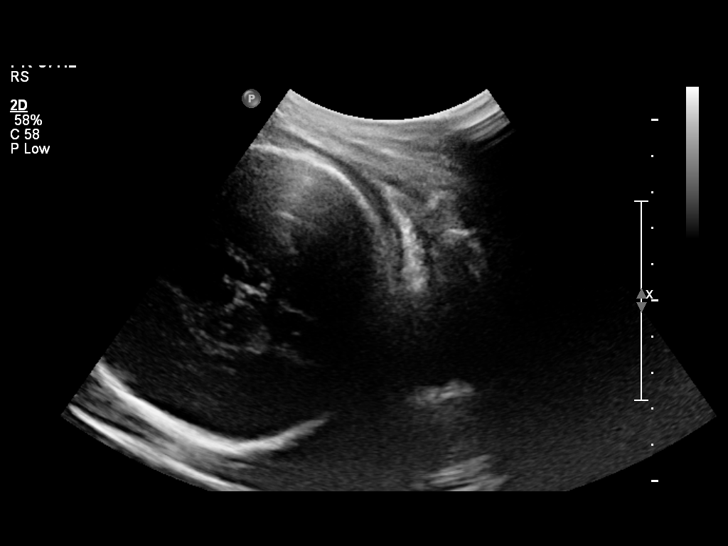
[im 2/17]
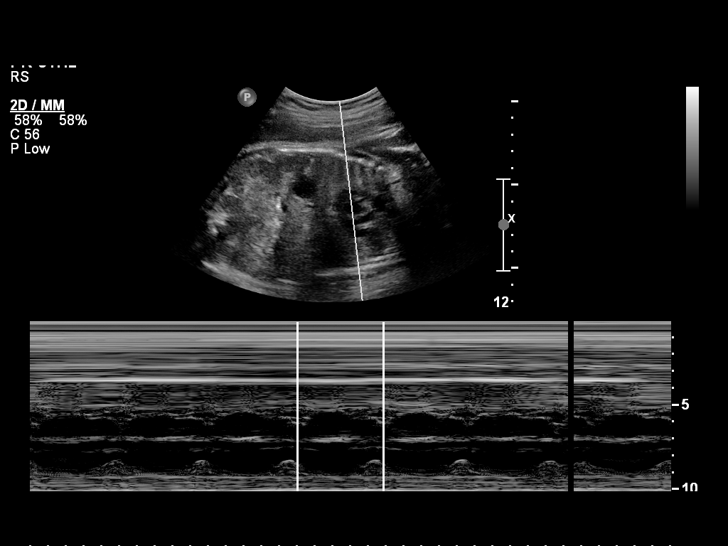
[im 4/17]
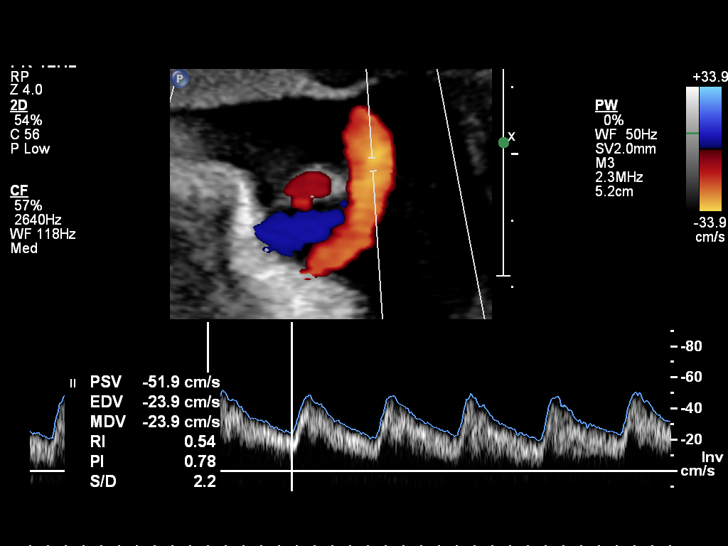
[im 5/17]
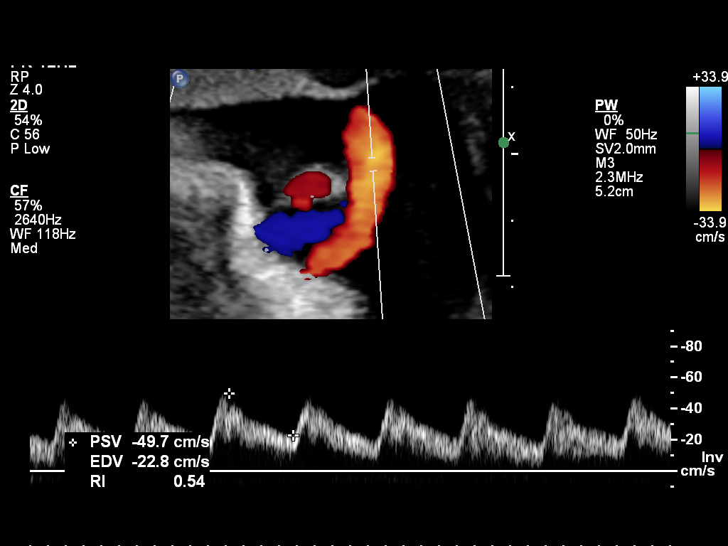
[im 6/17]
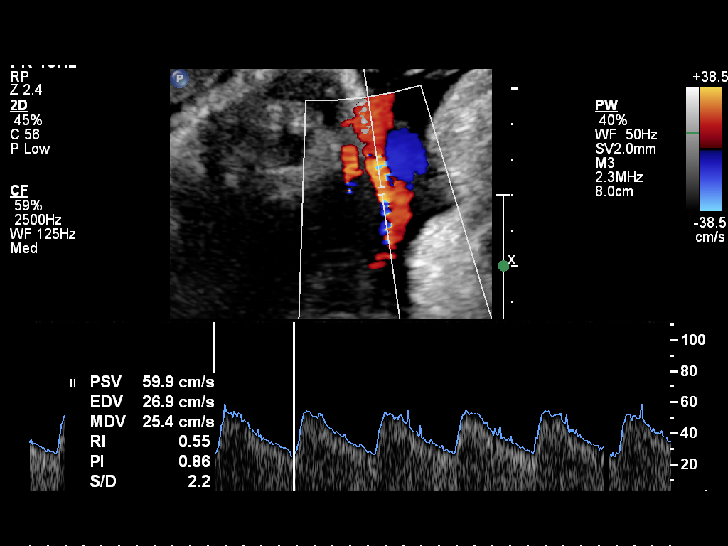
[im 8/17]
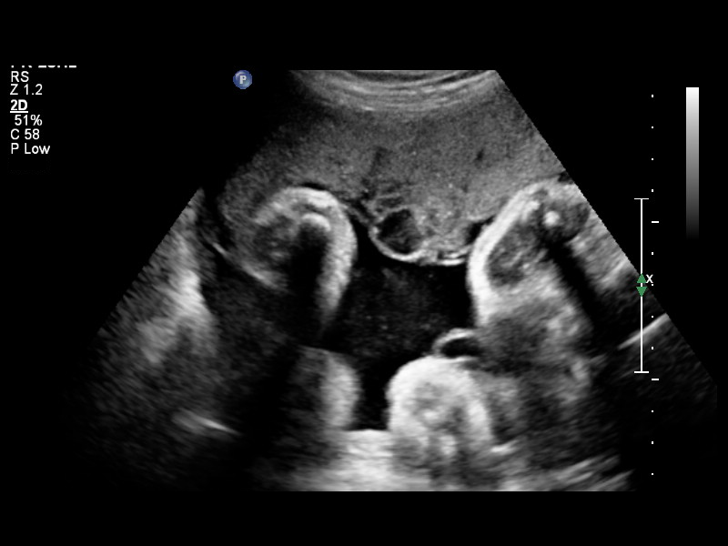
[im 9/17]
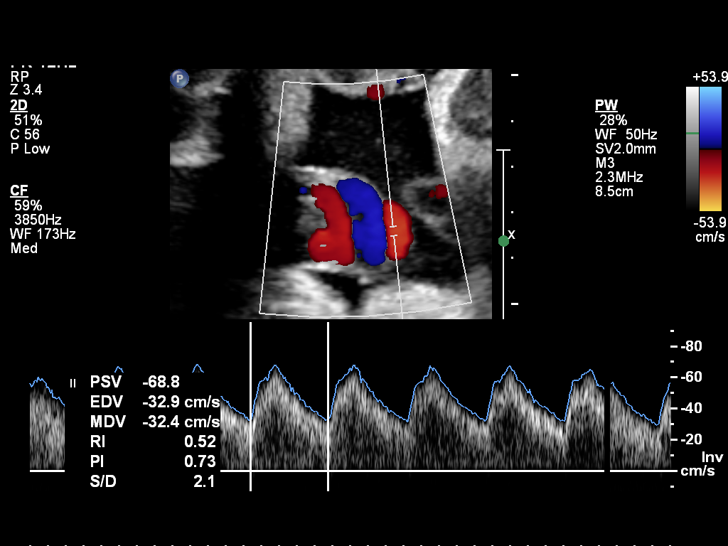
[im 10/17]
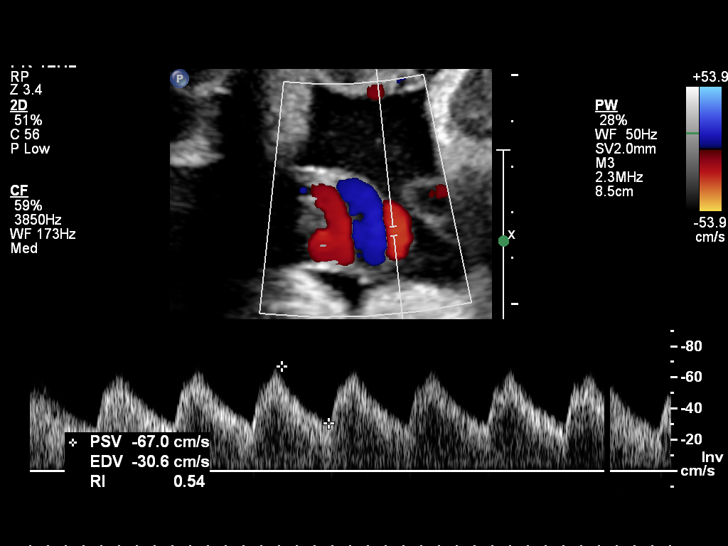
[im 12/17]
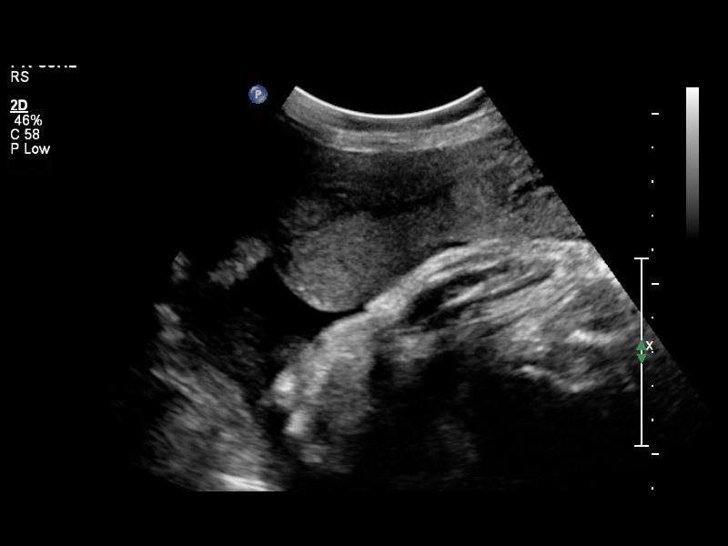
[im 13/17]
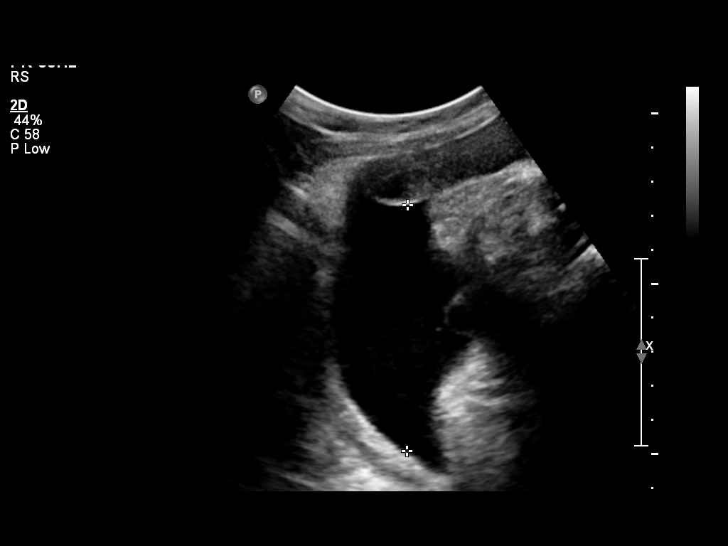
[im 14/17]
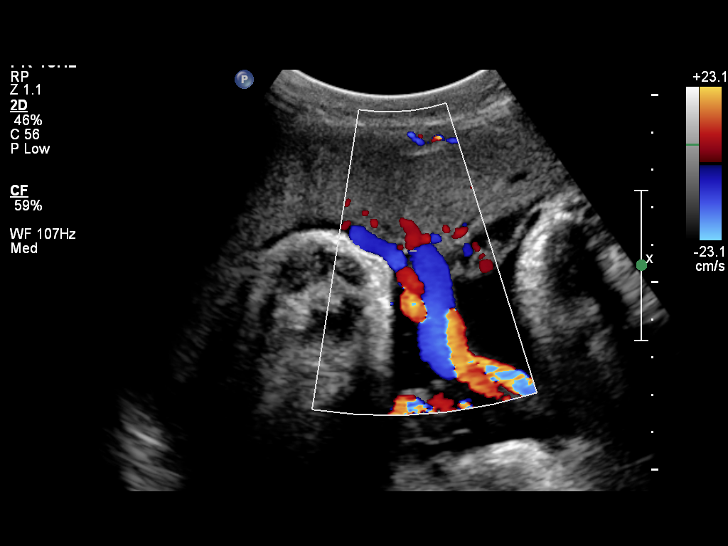
[im 16/17]
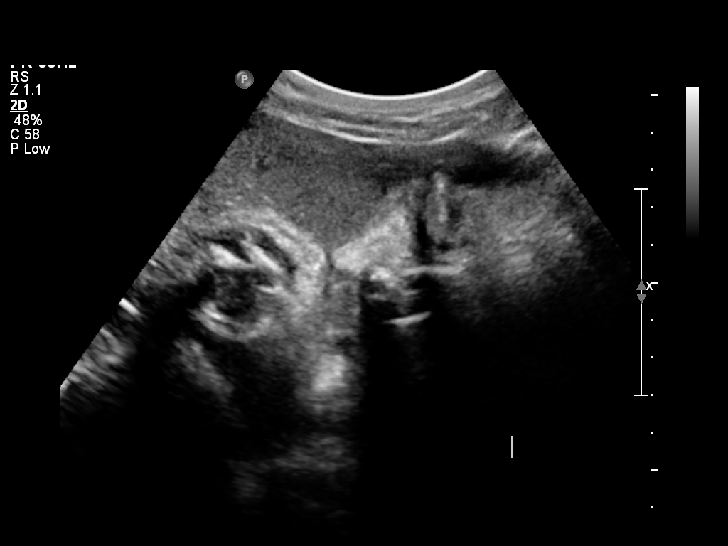
[im 17/17]
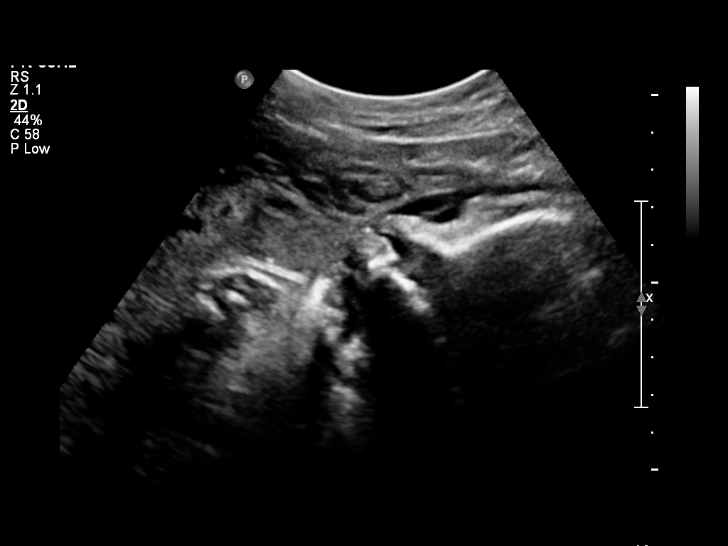

[13 of 17 positions shown; findings below may reference images not displayed]

OBSTETRICS REPORT
                      (Signed Final 10/16/2011 [DATE])

 Order#:         [id number]
                 [id number]
Procedures

 US UA CORD DOPPLER                                    76827.2
Indications

 Assess fetal well being
 Intrauterine Growth Restriction
 Non-reactive NST in [REDACTED] today
Fetal Evaluation

 Fetal Heart Rate:  124                         bpm
 Cardiac Activity:  Observed
 Presentation:      Cephalic
 Placenta:          Anterior, above cervical os
 P. Cord            Visualized
 Insertion:

 Amniotic Fluid
 AFI FV:      Subjectively within normal limits
                                             Larg Pckt:     7.2  cm
Biophysical Evaluation

 Amniotic F.V:   Pocket => 2 cm two         F. Tone:        Observed
                 planes
 F. Movement:    Observed                   Score:          [DATE]
 F. Breathing:   Observed
Gestational Age

 LMP:           37w 2d       Date:   01/28/11                 EDD:   11/04/11
 Best:          37w 2d    Det. By:   LMP  (01/28/11)          EDD:   11/04/11
Doppler - Fetal Vessels

 Umbilical Artery
 S/D:   2.2            42  %tile
 Umbilical Artery
 Absent DFV:    No     Reverse DFV:    No
Impression

 Biophysical profile score of [DATE].
 Umbilical artery Doppler within normal limits.

## 2013-04-05 IMAGING — US US PELVIS COMPLETE
2 series · 13 of 25 positions shown · non-contrast
Comparison: None

CLINICAL DATA: 1 week status post vaginal delivery.  Heavy vaginal
bleeding.  Evaluate for retained products.

TRANSABDOMINAL ULTRASOUND OF PELVIS
TECHNIQUE: Transabdominal ultrasound examination of the pelvis was
performed including evaluation of the uterus, ovaries, adnexal
regions, and pelvic cul-de-sac.

[Series 1: us pelvis complete · 3 of 13 slices shown (1 of 2)]
[im 1/13]
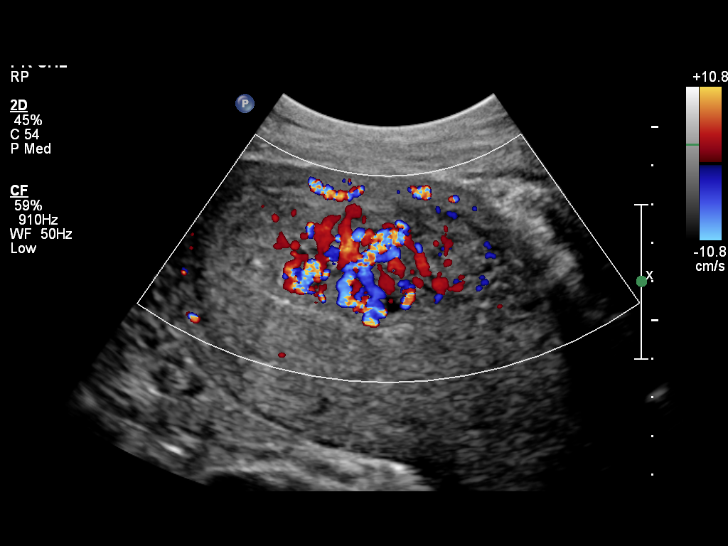
[im 5/13]
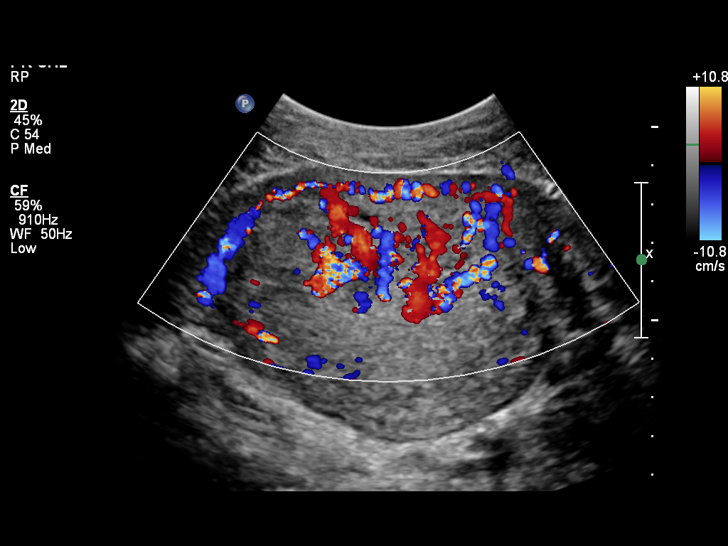
[im 10/13]
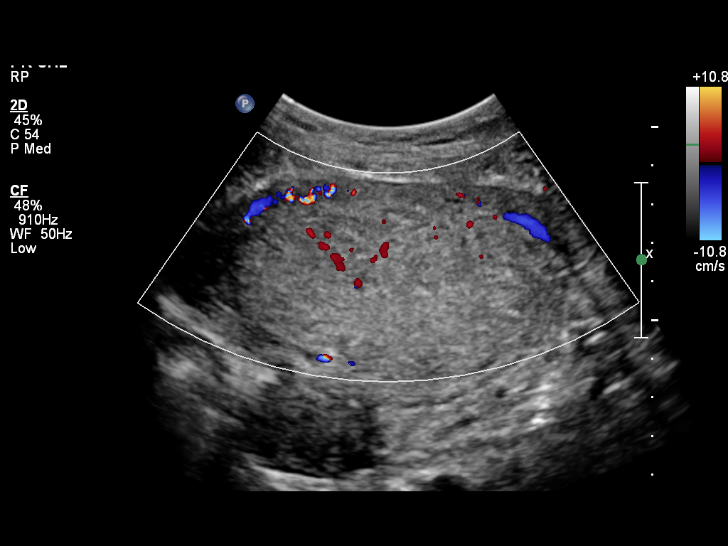

[Series 1: us pelvis complete · 10 of 39 slices shown (2 of 2)]
[im 1/39]
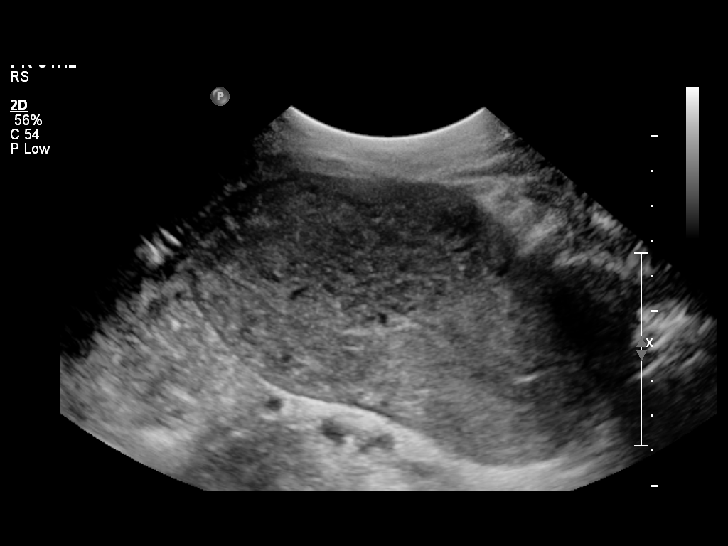
[im 5/39]
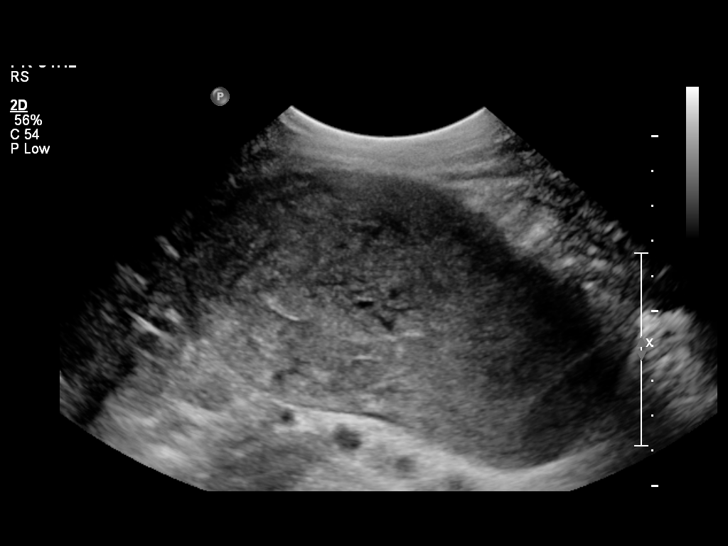
[im 9/39]
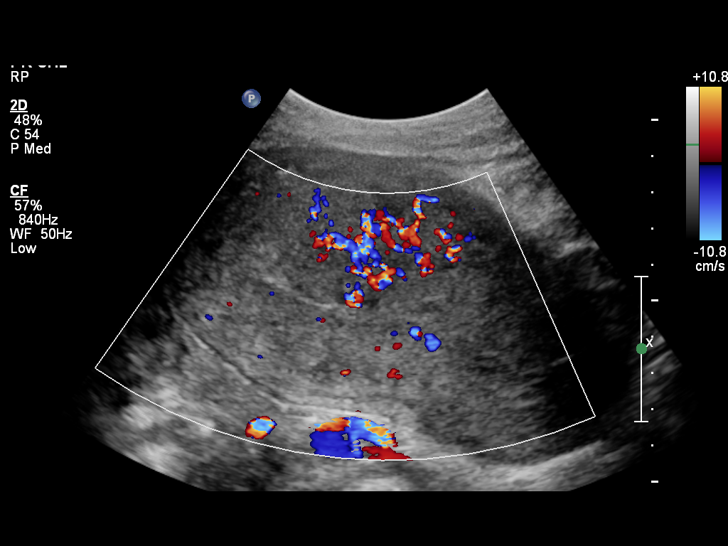
[im 13/39]
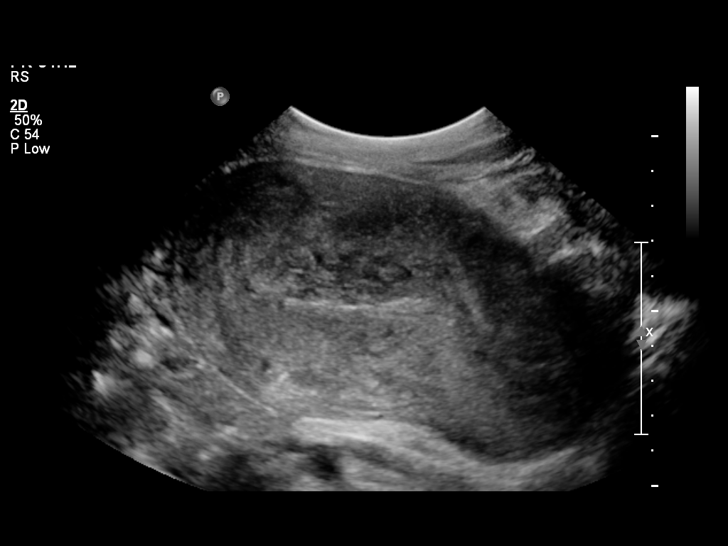
[im 17/39]
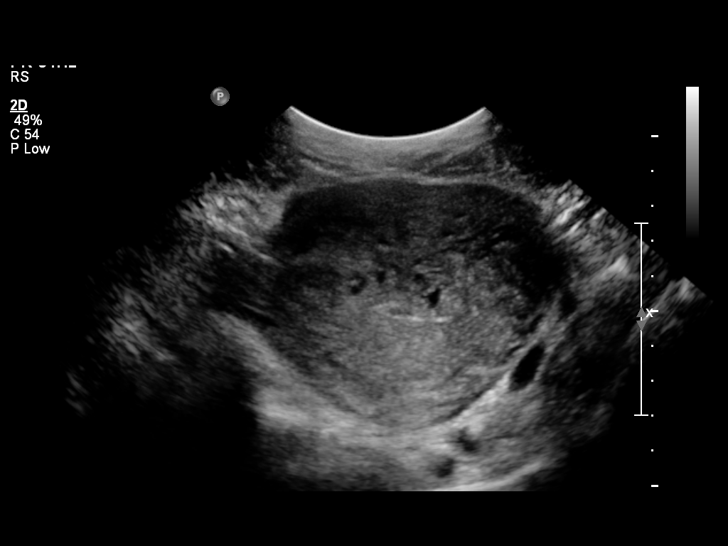
[im 22/39]
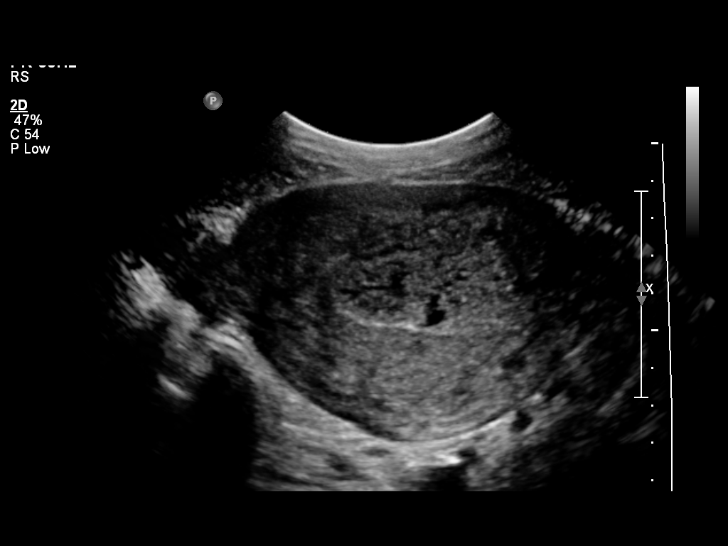
[im 26/39]
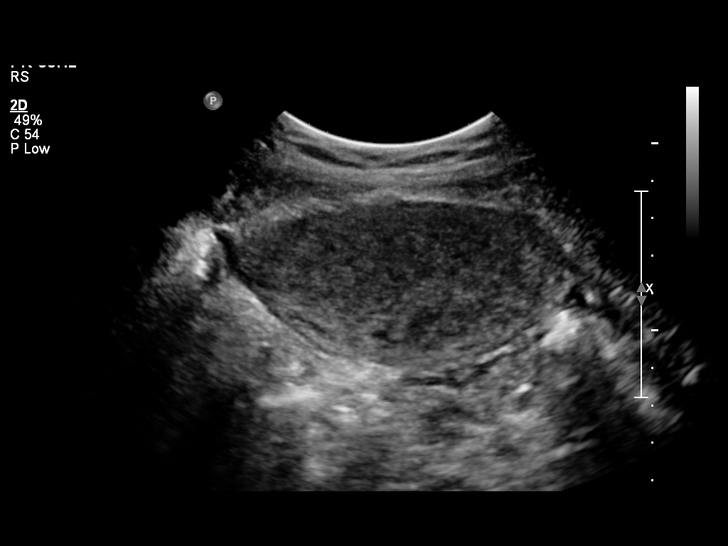
[im 30/39]
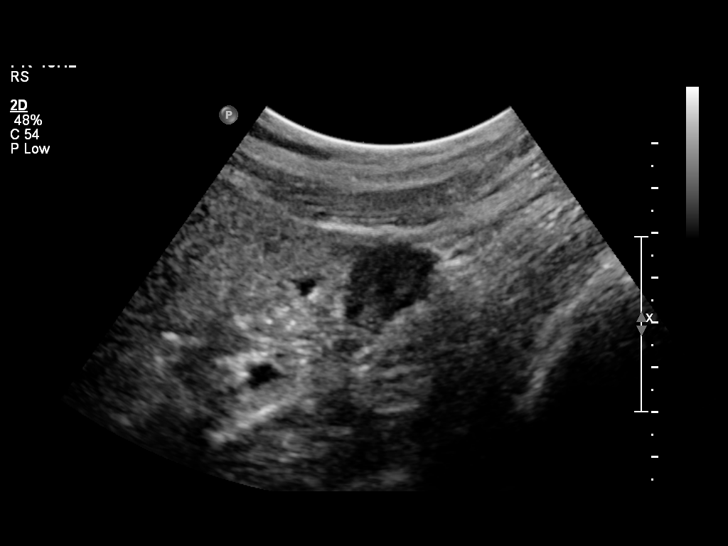
[im 34/39]
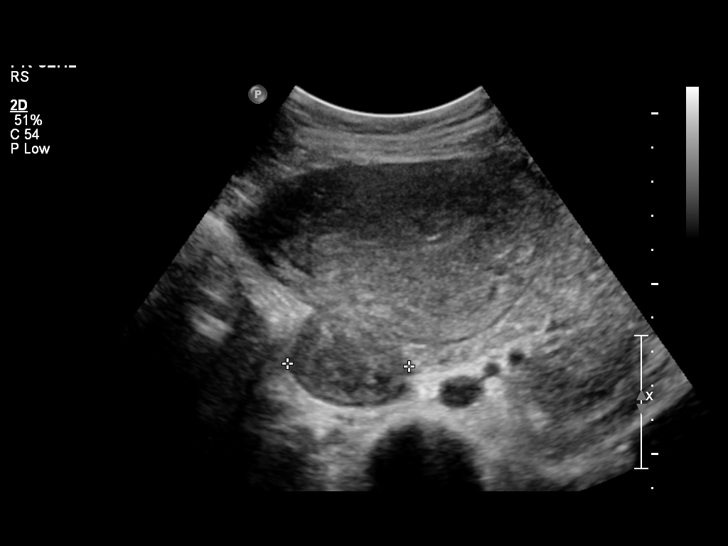
[im 39/39]
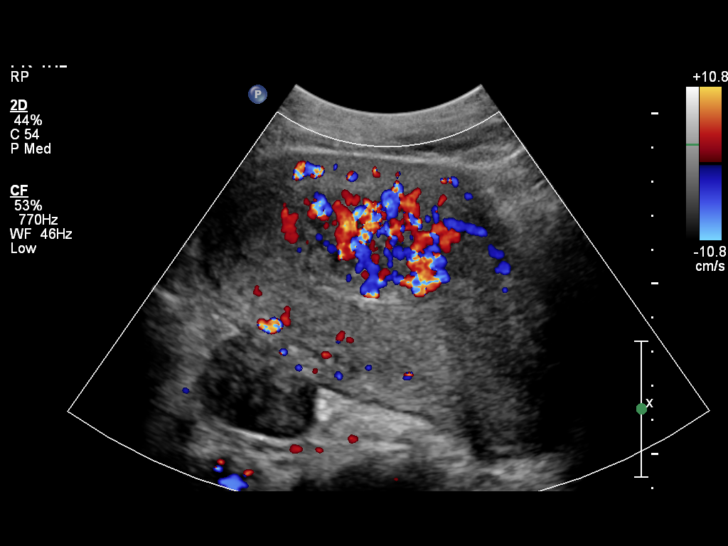

[13 of 25 positions shown; findings below may reference images not displayed]

FINDINGS: Uterus measures 12.2 x 6.7 x 8.3 cm.  A single small subserosal
fibroid is seen in the right posterior uterine body measuring
cm.

Endometrium:  A large heterogeneous hypervascular mass is seen
within the endometrial cavity which also shows extension into the
anterior myometrial wall.  This measures approximately 6.4 x 3.1 x
6.9 cm, and is consistent with retained products of conception with
anterior placenta increta.

Right Ovary measures 3.1 x 1.8 x 1.9 cm.  Normal appearance.

Left Ovary measures 2.6 x 1.5 x 2.1 cm.  Normal appearance.

Other Findings:  No other abnormality identified.
IMPRESSION: 1.  6.9 cm hypervascular mass in the endometrial cavity with
extension into the anterior myometrial wall.  This is consistent
with retained products of conception with anterior placenta
increta.
2.  3.7 cm right posterior subserosal fibroid.
3.  Normal appearance of both ovaries.  No evidence of free fluid.

## 2013-04-15 ENCOUNTER — Inpatient Hospital Stay (HOSPITAL_COMMUNITY)
Admission: AD | Admit: 2013-04-15 | Discharge: 2013-04-15 | Disposition: A | Discharge status: Home / Self Care | Payer: Medicaid Other | Source: Ambulatory Visit | Attending: Obstetrics & Gynecology | Admitting: Obstetrics & Gynecology

## 2013-04-15 ENCOUNTER — Inpatient Hospital Stay (HOSPITAL_COMMUNITY): Payer: Medicaid Other

## 2013-04-15 ENCOUNTER — Encounter (HOSPITAL_COMMUNITY): Payer: Self-pay | Admitting: Medical

## 2013-04-15 DIAGNOSIS — O26899 Other specified pregnancy related conditions, unspecified trimester: Secondary | ICD-10-CM

## 2013-04-15 DIAGNOSIS — R109 Unspecified abdominal pain: Secondary | ICD-10-CM | POA: Insufficient documentation

## 2013-04-15 DIAGNOSIS — O9989 Other specified diseases and conditions complicating pregnancy, childbirth and the puerperium: Secondary | ICD-10-CM

## 2013-04-15 DIAGNOSIS — O210 Mild hyperemesis gravidarum: Secondary | ICD-10-CM | POA: Insufficient documentation

## 2013-04-15 DIAGNOSIS — O99891 Other specified diseases and conditions complicating pregnancy: Secondary | ICD-10-CM | POA: Insufficient documentation

## 2013-04-15 DIAGNOSIS — N949 Unspecified condition associated with female genital organs and menstrual cycle: Secondary | ICD-10-CM | POA: Insufficient documentation

## 2013-04-15 DIAGNOSIS — K5900 Constipation, unspecified: Secondary | ICD-10-CM | POA: Insufficient documentation

## 2013-04-15 LAB — URINALYSIS, ROUTINE W REFLEX MICROSCOPIC
Bilirubin Urine: NEGATIVE
Leukocytes, UA: NEGATIVE
Nitrite: NEGATIVE
Specific Gravity, Urine: 1.015 (ref 1.005–1.030)
Urobilinogen, UA: 0.2 mg/dL (ref 0.0–1.0)
pH: 6 (ref 5.0–8.0)

## 2013-04-15 LAB — CBC
Hemoglobin: 13.3 g/dL (ref 12.0–15.0)
MCH: 30.9 pg (ref 26.0–34.0)
Platelets: 306 10*3/uL (ref 150–400)
RBC: 4.3 MIL/uL (ref 3.87–5.11)
WBC: 8.7 10*3/uL (ref 4.0–10.5)

## 2013-04-15 LAB — HCG, QUANTITATIVE, PREGNANCY: hCG, Beta Chain, Quant, S: 3628 m[IU]/mL — ABNORMAL HIGH (ref <5)

## 2013-04-15 LAB — POCT PREGNANCY, URINE: Preg Test, Ur: POSITIVE — AB

## 2013-04-15 NOTE — MAU Note (Signed)
Had positive home test today, hx of miscarriage. Wants to make sure every thing is ok.  Has been having a little cramping.

## 2013-04-15 NOTE — MAU Provider Note (Signed)
History     CSN: [id number]  Arrival date and time: 04/15/13 1442   First Provider Initiated Contact with Patient 04/15/13 1737      Chief Complaint  Patient presents with  . Possible Pregnancy  . Abdominal Cramping   HPI [name] is a 27 y.o. G3P1011 at 61w6dwho presents to MAU today with abdominal cramping. The pain was earlier today and has stopped now. The patient vaginal bleeding, discharge, UTI symptoms, fever, dizziness, N/V/D. She states mild constipation.   OB History   Grav Para Term Preterm Abortions TAB SAB Ect Mult Living   3 1 1  1  1   1       Past Medical History  Diagnosis Date  . Anemia   . Chronic ulcerative colitis   . Blood transfusion   . Ulcerative colitis   . Headache     Past Surgical History  Procedure Laterality Date  . Colon biopsy    . Tonsillectomy    . Dilation and evacuation  08/21/2012    Procedure: DILATATION AND EVACUATION;  Surgeon: KLogan Bores MD;  [facility];  Service: Gynecology;  Laterality: N/A;    Family History  Problem Relation Age of Onset  . Anesthesia problems Neg Hx   . Hypotension Neg Hx   . Malignant hyperthermia Neg Hx   . Pseudochol deficiency Neg Hx   . Other Neg Hx     History  Substance Use Topics  . Smoking status: Former Smoker -- 3 years    Types: Cigarettes    Quit date: 05/16/2010  . Smokeless tobacco: Never Used  . Alcohol Use: No    Allergies:  Allergies  Allergen Reactions  . Latex Rash    gloves    Prescriptions prior to admission  Medication Sig Dispense Refill  . mesalamine (LIALDA) 1.2 G EC tablet Take 4,800 mg by mouth daily with breakfast.      . methocarbamol (ROBAXIN) 500 MG tablet Take 500 mg by mouth 3 (three) times daily as needed (for muscle relaxer).      . ondansetron (ZOFRAN) 4 MG tablet Take 4 mg by mouth every 6 (six) hours as needed for nausea.        Review of Systems  Constitutional: Negative for fever and malaise/fatigue.    Gastrointestinal: Positive for abdominal pain and constipation. Negative for nausea, vomiting and diarrhea.  Genitourinary: Negative for dysuria, urgency and frequency.       Neg - vaginal bleeding, discharge  Neurological: Negative for dizziness and loss of consciousness.   Physical Exam   Blood pressure 129/73, pulse 87, temperature 98.4 F (36.9 C), temperature source Oral, resp. rate 18, last menstrual period 03/12/2013.  Physical Exam  Constitutional: She is oriented to person, place, and time. She appears well-developed and well-nourished. No distress.  HENT:  Head: Normocephalic and atraumatic.  Cardiovascular: Normal rate, regular rhythm and normal heart sounds.   Respiratory: Effort normal and breath sounds normal. No respiratory distress.  GI: Soft. Bowel sounds are normal. She exhibits no distension and no mass. There is no tenderness. There is no rebound and no guarding.  Neurological: She is alert and oriented to person, place, and time.  Skin: Skin is warm and dry. No erythema.  Psychiatric: She has a normal mood and affect.    Results for orders placed during the hospital encounter of 04/15/13 (from the past 24 hour(s))  URINALYSIS, ROUTINE W REFLEX MICROSCOPIC     Status: Abnormal  Collection Time    04/15/13  2:50 PM      Result Value Range   Color, Urine YELLOW  YELLOW   APPearance CLEAR  CLEAR   Specific Gravity, Urine 1.015  1.005 - 1.030   pH 6.0  5.0 - 8.0   Glucose, UA NEGATIVE  NEGATIVE mg/dL   Hgb urine dipstick NEGATIVE  NEGATIVE   Bilirubin Urine NEGATIVE  NEGATIVE   Ketones, ur 15 (*) NEGATIVE mg/dL   Protein, ur NEGATIVE  NEGATIVE mg/dL   Urobilinogen, UA 0.2  0.0 - 1.0 mg/dL   Nitrite NEGATIVE  NEGATIVE   Leukocytes, UA NEGATIVE  NEGATIVE  POCT PREGNANCY, URINE     Status: Abnormal   Collection Time    04/15/13  2:58 PM      Result Value Range   Preg Test, Ur POSITIVE (*) NEGATIVE  CBC     Status: None   Collection Time    04/15/13  3:20  PM      Result Value Range   WBC 8.7  4.0 - 10.5 K/uL   RBC 4.30  3.87 - 5.11 MIL/uL   Hemoglobin 13.3  12.0 - 15.0 g/dL   HCT 38.9  36.0 - 46.0 %   MCV 90.5  78.0 - 100.0 fL   MCH 30.9  26.0 - 34.0 pg   MCHC 34.2  30.0 - 36.0 g/dL   RDW 14.1  11.5 - 15.5 %   Platelets 306  150 - 400 K/uL  HCG, QUANTITATIVE, PREGNANCY     Status: Abnormal   Collection Time    04/15/13  3:20 PM      Result Value Range   hCG, Beta Chain, Quant, S 3628 (*) <5 mIU/mL    MAU Course  Procedures None  MDM UA, UPT, Wet prep, GC/Chlamydia, quant hCG, CBC and [place] today Please note that patient had labs and [place] based on triage note as there were no rooms available in MAU. Patient got to a room for further evaluation with provider and refused pelvic at that time. Now states no more pain.  Patient refused pelvic exam. Patient aware that vaginal infections could cause cramping.  B+ in Epic from previous visit Assessment and Plan  A: IUGS at 4w 6d without YS  P: Discharge home Patient to return to MAU in 48 hours for repeat quant hCG levels Bleeding/Ectopic precautions discussed Patient may return to MAU sooner as needed  Farris Has, PA-C 04/15/2013, 5:37 PM

## 2013-04-17 ENCOUNTER — Inpatient Hospital Stay (HOSPITAL_COMMUNITY)
Admission: AD | Admit: 2013-04-17 | Discharge: 2013-04-17 | Disposition: A | Discharge status: Home / Self Care | Payer: Medicaid Other | Source: Ambulatory Visit | Attending: Family Medicine | Admitting: Family Medicine

## 2013-04-17 DIAGNOSIS — R109 Unspecified abdominal pain: Secondary | ICD-10-CM | POA: Insufficient documentation

## 2013-04-17 DIAGNOSIS — O99891 Other specified diseases and conditions complicating pregnancy: Secondary | ICD-10-CM | POA: Insufficient documentation

## 2013-04-17 DIAGNOSIS — Z09 Encounter for follow-up examination after completed treatment for conditions other than malignant neoplasm: Secondary | ICD-10-CM

## 2013-04-17 LAB — HCG, QUANTITATIVE, PREGNANCY: hCG, Beta Chain, Quant, S: 6985 m[IU]/mL — ABNORMAL HIGH (ref <5)

## 2013-04-17 NOTE — MAU Note (Signed)
Patient to MAU for repeat BHCG. Patient denies bleeding or pain.

## 2013-04-17 NOTE — Progress Notes (Addendum)
S:  Pt is a G3P1011 at 5.1 wks IUP presenting for follow-up [facility]; seen in MAU on 5/27 for abdominal pain.  BHCG was 3628.  Ultrasound showed IUGS, no yolk sac.  Here today with no abd pain or bleeding. O: Filed Vitals:   04/17/13 1223  BP: 95/66  Pulse: 86  Temp: 98.3 F (36.8 C)  Resp: 16   BP 95/66  Pulse 86  Temp(Src) 98.3 F (36.8 C) (Oral)  Resp 16  SpO2 100%  LMP 03/12/2013 General appearance: alert and cooperative  Results for orders placed during the hospital encounter of 04/17/13 (from the past 24 hour(s))  HCG, QUANTITATIVE, PREGNANCY     Status: Abnormal   Collection Time    04/17/13 12:10 PM      Result Value Range   hCG, Beta Chain, Quant, S 6985 (*) <5 mIU/mL   A: IUP P:  Appt with Dr. Marvel Plan Ectopic precautions Ultrasound for viability in two weeks - 6/12 at 9:15am [facility] [place]

## 2013-04-21 ENCOUNTER — Inpatient Hospital Stay (HOSPITAL_COMMUNITY)
Admission: AD | Admit: 2013-04-21 | Discharge: 2013-04-22 | Disposition: A | Discharge status: Home / Self Care | Payer: Medicaid Other | Source: Ambulatory Visit | Attending: Obstetrics and Gynecology | Admitting: Obstetrics and Gynecology

## 2013-04-21 DIAGNOSIS — O26899 Other specified pregnancy related conditions, unspecified trimester: Secondary | ICD-10-CM

## 2013-04-21 DIAGNOSIS — O99891 Other specified diseases and conditions complicating pregnancy: Secondary | ICD-10-CM | POA: Insufficient documentation

## 2013-04-21 DIAGNOSIS — R109 Unspecified abdominal pain: Secondary | ICD-10-CM | POA: Insufficient documentation

## 2013-04-22 ENCOUNTER — Inpatient Hospital Stay (HOSPITAL_COMMUNITY): Payer: Medicaid Other

## 2013-04-22 ENCOUNTER — Encounter (HOSPITAL_COMMUNITY): Payer: Self-pay | Admitting: *Deleted

## 2013-04-22 DIAGNOSIS — O26899 Other specified pregnancy related conditions, unspecified trimester: Secondary | ICD-10-CM

## 2013-04-22 DIAGNOSIS — R109 Unspecified abdominal pain: Secondary | ICD-10-CM

## 2013-04-22 LAB — CBC
HCT: 37.6 % (ref 36.0–46.0)
MCH: 31.2 pg (ref 26.0–34.0)
MCV: 90.2 fL (ref 78.0–100.0)
Platelets: 313 10*3/uL (ref 150–400)
RDW: 13.9 % (ref 11.5–15.5)

## 2013-04-22 LAB — URINALYSIS, ROUTINE W REFLEX MICROSCOPIC
Bilirubin Urine: NEGATIVE
Hgb urine dipstick: NEGATIVE
Ketones, ur: NEGATIVE mg/dL
Nitrite: NEGATIVE
Urobilinogen, UA: 0.2 mg/dL (ref 0.0–1.0)

## 2013-04-22 MED ORDER — OXYCODONE-ACETAMINOPHEN 5-325 MG PO TABS
2.0000 | ORAL_TABLET | Freq: Once | ORAL | Status: AC
  Administered 2013-04-22: 2 via ORAL
  Filled 2013-04-22: qty 2

## 2013-04-22 MED ORDER — OXYCODONE-ACETAMINOPHEN 5-325 MG PO TABS: 1.0000 | ORAL_TABLET | ORAL | Status: DC | PRN

## 2013-04-22 NOTE — MAU Note (Signed)
Pt started with lower abdominal cramping tonight while at work.  She denies any heavy lifting but she climbs ladders; no bleeding, discharge or urinary difficulties

## 2013-04-22 NOTE — MAU Provider Note (Signed)
Chief Complaint: No chief complaint on file. Chief Complaint: No chief complaint on file.  First Provider Initiated Contact with Patient 04/22/13 0031     SUBJECTIVE HPI: Joyce Solomon is a 27 y.o. G3P1011 at 89w6dby LMP who presents with lower abdominal cramping 10/10 tonight while at work and generalized, throbbing headache. She denies VB, vaginal discharge, injury, urinary complaints, GI complaints or neck stiffness. Last BM yesterday. Seen in MAU 04/15/13 and 04/17/13 for cramping. Appropriate rise in quants. [place]27[place]14 showed 4.6 week GS. No YS or FP.  Results for GVESSIE, OLMSTED(MRN [id number] as of 04/21/2013 23:53  Ref. Range 04/15/2013 15:20 04/17/2013 12:10  hCG, Beta Chain, Quant, S Latest Range: <5 mIU/mL 3628 (H) 6985 (H)    Past Medical History  Diagnosis Date  . Anemia   . Chronic ulcerative colitis   . Blood transfusion   . Ulcerative colitis   . Headache(784.0)    OB History   Grav Para Term Preterm Abortions TAB SAB Ect Mult Living   3 1 1  1  1   1      # Outc Date GA Lbr Len/2nd Wgt Sex Del Anes PTL Lv   1 TRM 12/12 310w0d0:42 / 00:33 2.1.155MC([id number] SVD EPI  Yes   Comments: no dysmorphic features; apnea secondary to tight nuchal cord and acute vascular depletion; IUGR not evident clinically; see consult note.   2 SAB            3 CUR              Past Surgical History  Procedure Laterality Date  . Colon biopsy    . Tonsillectomy    . Dilation and evacuation  08/21/2012    Procedure: DILATATION AND EVACUATION;  Surgeon: KaLogan BoresMD;  [facility];  Service: Gynecology;  Laterality: N/A;   History   Social History  . Marital Status: Single    Spouse Name: N/A    Number of Children: N/A  . Years of Education: N/A   Occupational History  . Not on file.   Social History Main Topics  . Smoking status: Former Smoker -- 3 years    Types: Cigarettes    Quit date: 05/16/2010  . Smokeless tobacco: Never Used  . Alcohol Use: No  .  Drug Use: No  . Sexually Active: Yes    Birth Control/ Protection: None   Other Topics Concern  . Not on file   Social History Narrative  . No narrative on file   No current facility-administered medications on file prior to encounter.   Current Outpatient Prescriptions on File Prior to Encounter  Medication Sig Dispense Refill  . ondansetron (ZOFRAN) 4 MG tablet Take 4 mg by mouth every 6 (six) hours as needed for nausea.       Allergies  Allergen Reactions  . Latex Rash    gloves    ROS: Pertinent items in HPI  OBJECTIVE Last menstrual period 03/12/2013. GENERAL: Well-developed, well-nourished female in mild distress.  HEENT: Normocephalic. No sinus tenderness, rhinorrhea or congestion.  HEART: normal rate RESP: normal effort ABDOMEN: Soft, moderate umbilical tenderness. No low abd tenderness. Pos BS x 4. EXTREMITIES: Nontender, no edema NEURO: Alert and oriented SPECULUM EXAM: Deferred  LAB RESULTS Results for orders placed during the hospital encounter of 04/21/13 (from the past 24 hour(s))  URINALYSIS, ROUTINE W REFLEX MICROSCOPIC     Status: Abnormal   Collection Time    04/21/13 11:50  PM      Result Value Range   Color, Urine YELLOW  YELLOW   APPearance CLEAR  CLEAR   Specific Gravity, Urine <1.005 (*) 1.005 - 1.030   pH 5.5  5.0 - 8.0   Glucose, UA NEGATIVE  NEGATIVE mg/dL   Hgb urine dipstick NEGATIVE  NEGATIVE   Bilirubin Urine NEGATIVE  NEGATIVE   Ketones, ur NEGATIVE  NEGATIVE mg/dL   Protein, ur NEGATIVE  NEGATIVE mg/dL   Urobilinogen, UA 0.2  0.0 - 1.0 mg/dL   Nitrite NEGATIVE  NEGATIVE   Leukocytes, UA NEGATIVE  NEGATIVE  HCG, QUANTITATIVE, PREGNANCY     Status: Abnormal   Collection Time    04/22/13 12:35 AM      Result Value Range   hCG, Beta Chain, Quant, S 19072 (*) <5 mIU/mL  CBC     Status: None   Collection Time    04/22/13 12:35 AM      Result Value Range   WBC 8.5  4.0 - 10.5 K/uL   RBC 4.17  3.87 - 5.11 MIL/uL   Hemoglobin 13.0   12.0 - 15.0 g/dL   HCT 37.6  36.0 - 46.0 %   MCV 90.2  78.0 - 100.0 fL   MCH 31.2  26.0 - 34.0 pg   MCHC 34.6  30.0 - 36.0 g/dL   RDW 13.9  11.5 - 15.5 %   Platelets 313  150 - 400 K/uL    IMAGING  ASSESSMENT 1. Abdominal pain complicating pregnancy-IUP confirmed     PLAN Discharge home in stable condition. SAB precautions     Follow-up Information   Follow up with [facility] On 05/01/2013. (as scheduled)    Contact information:   [address] [id number] [place] [address] [phone]      Follow up with [facility]. (As needed if symptoms worsen)    Contact information:   [address] [id number] [place] [address] [phone]       Medication List    TAKE these medications       ondansetron 4 MG tablet  Commonly known as:  ZOFRAN  Take 4 mg by mouth every 6 (six) hours as needed for nausea.     oxyCODONE-acetaminophen 5-325 MG per tablet  Commonly known as:  ROXICET  Take 1 tablet by mouth every 4 (four) hours as needed for pain.       [place], CNM 04/22/2013  1:54 AM

## 2013-05-01 ENCOUNTER — Inpatient Hospital Stay (HOSPITAL_COMMUNITY)
Admission: AD | Admit: 2013-05-01 | Discharge: 2013-05-01 | Disposition: A | Discharge status: Home / Self Care | Payer: Medicaid Other | Source: Ambulatory Visit | Attending: Family Medicine | Admitting: Family Medicine

## 2013-05-01 ENCOUNTER — Ambulatory Visit (HOSPITAL_COMMUNITY)
Admission: RE | Admit: 2013-05-01 | Discharge: 2013-05-01 | Disposition: A | Discharge status: Home / Self Care | Payer: Medicaid Other | Source: Ambulatory Visit | Attending: Family | Admitting: Family

## 2013-05-01 DIAGNOSIS — O99891 Other specified diseases and conditions complicating pregnancy: Secondary | ICD-10-CM | POA: Insufficient documentation

## 2013-05-01 DIAGNOSIS — Z3689 Encounter for other specified antenatal screening: Secondary | ICD-10-CM | POA: Insufficient documentation

## 2013-05-01 DIAGNOSIS — N831 Corpus luteum cyst of ovary, unspecified side: Secondary | ICD-10-CM | POA: Insufficient documentation

## 2013-05-01 DIAGNOSIS — O34599 Maternal care for other abnormalities of gravid uterus, unspecified trimester: Secondary | ICD-10-CM | POA: Insufficient documentation

## 2013-05-01 DIAGNOSIS — R109 Unspecified abdominal pain: Secondary | ICD-10-CM

## 2013-05-01 DIAGNOSIS — Z09 Encounter for follow-up examination after completed treatment for conditions other than malignant neoplasm: Secondary | ICD-10-CM

## 2013-05-01 DIAGNOSIS — O26899 Other specified pregnancy related conditions, unspecified trimester: Secondary | ICD-10-CM

## 2013-05-01 NOTE — MAU Provider Note (Signed)
Chart reviewed and agree with management and plan.

## 2013-05-01 NOTE — MAU Provider Note (Signed)
  History     CSN: [id number]  Arrival date and time: 05/01/13 0959   None     Chief Complaint  Patient presents with  . Follow-up   HPI   Pt is 3w1dpregnant who presents for viability confirmation after being seen previously with an iregular gestational sac.  Pt denies pain or bleeding today   Past Medical History  Diagnosis Date  . Anemia   . Chronic ulcerative colitis   . Blood transfusion   . Ulcerative colitis   . HPPHKFEXM(147.0     Past Surgical History  Procedure Laterality Date  . Colon biopsy    . Tonsillectomy    . Dilation and evacuation  08/21/2012    Procedure: DILATATION AND EVACUATION;  Surgeon: KLogan Bores MD;  [facility];  Service: Gynecology;  Laterality: N/A;    Family History  Problem Relation Age of Onset  . Anesthesia problems Neg Hx   . Hypotension Neg Hx   . Malignant hyperthermia Neg Hx   . Pseudochol deficiency Neg Hx   . Other Neg Hx     History  Substance Use Topics  . Smoking status: Former Smoker -- 3 years    Types: Cigarettes    Quit date: 05/16/2010  . Smokeless tobacco: Never Used  . Alcohol Use: No    Allergies:  Allergies  Allergen Reactions  . Latex Rash    gloves    Prescriptions prior to admission  Medication Sig Dispense Refill  . ondansetron (ZOFRAN) 4 MG tablet Take 4 mg by mouth every 6 (six) hours as needed for nausea.      .Marland KitchenoxyCODONE-acetaminophen (ROXICET) 5-325 MG per tablet Take 1 tablet by mouth every 4 (four) hours as needed for pain.  15 tablet  0    ROS Physical Exam   Blood pressure 111/59, pulse 73, temperature 98.3 F (36.8 C), temperature source Oral, resp. rate 16, last menstrual period 03/12/2013.  Physical Exam  Vitals reviewed. Constitutional: She is oriented to person, place, and time. She appears well-developed and well-nourished. No distress.  HENT:  Head: Normocephalic.  Eyes: Pupils are equal, round, and reactive to light.  Neck: Normal range of motion. Neck  supple.  Cardiovascular: Normal rate.   Respiratory: Effort normal.  GI: Soft.  Musculoskeletal: Normal range of motion.  Neurological: She is alert and oriented to person, place, and time.  Skin: Skin is warm and dry.  Psychiatric: She has a normal mood and affect.    MAU Course  Procedures UKoreashowed viable IUP 746w1dregnant with small left CLC HR 118bpm  Assessment and Plan  Viable IUP 7w1w1du with OB care- [facility] in [facility] or any bleeding  Joyce Solomon 05/01/2013, 10:15 AM

## 2013-05-01 NOTE — MAU Note (Signed)
Viability [place], rollover from [place].  No complaints offered. No pain, no bleeding.

## 2013-05-12 ENCOUNTER — Inpatient Hospital Stay (HOSPITAL_COMMUNITY)
Admission: AD | Admit: 2013-05-12 | Discharge: 2013-05-12 | Disposition: A | Discharge status: Home / Self Care | Payer: Medicaid Other | Source: Ambulatory Visit | Attending: Family Medicine | Admitting: Family Medicine

## 2013-05-12 ENCOUNTER — Encounter (HOSPITAL_COMMUNITY): Payer: Self-pay

## 2013-05-12 DIAGNOSIS — O210 Mild hyperemesis gravidarum: Secondary | ICD-10-CM | POA: Insufficient documentation

## 2013-05-12 DIAGNOSIS — O219 Vomiting of pregnancy, unspecified: Secondary | ICD-10-CM

## 2013-05-12 LAB — URINALYSIS, ROUTINE W REFLEX MICROSCOPIC
Bilirubin Urine: NEGATIVE
Hgb urine dipstick: NEGATIVE
Ketones, ur: NEGATIVE mg/dL
Nitrite: NEGATIVE
Urobilinogen, UA: 0.2 mg/dL (ref 0.0–1.0)
pH: 6.5 (ref 5.0–8.0)

## 2013-05-12 MED ORDER — ONDANSETRON HCL 4 MG PO TABS: 4.0000 mg | ORAL_TABLET | Freq: Four times a day (QID) | ORAL | Status: DC

## 2013-05-12 MED ORDER — ONDANSETRON 8 MG PO TBDP
8.0000 mg | ORAL_TABLET | Freq: Once | ORAL | Status: AC
  Administered 2013-05-12: 8 mg via ORAL
  Filled 2013-05-12: qty 1

## 2013-05-12 MED ORDER — PROMETHAZINE HCL 25 MG PO TABS: 25.0000 mg | ORAL_TABLET | Freq: Four times a day (QID) | ORAL | Status: DC | PRN

## 2013-05-12 NOTE — MAU Note (Signed)
Patient is in with c/o n/v. Denies vaginal bleeding or abdominal pain.

## 2013-05-12 NOTE — MAU Provider Note (Signed)
History     CSN: [id number]  Arrival date and time: 05/12/13 1544   First Provider Initiated Contact with Patient 05/12/13 1615      Chief Complaint  Patient presents with  . Nausea  . Emesis   HPI [name] is a 27 y.o. G3P1011 at 86w5dwho presents to MAU today with N/V x 2 days. She denies diarrhea, fever, abdominal pain, vaginal bleeding, discharge or UTI symptoms.   OB History   Grav Para Term Preterm Abortions TAB SAB Ect Mult Living   3 1 1  1  1   1       Past Medical History  Diagnosis Date  . Anemia   . Chronic ulcerative colitis   . Blood transfusion   . Ulcerative colitis   . HZYYQMGNO(037.0     Past Surgical History  Procedure Laterality Date  . Colon biopsy    . Tonsillectomy    . Dilation and evacuation  08/21/2012    Procedure: DILATATION AND EVACUATION;  Surgeon: KLogan Bores MD;  [facility];  Service: Gynecology;  Laterality: N/A;    Family History  Problem Relation Age of Onset  . Anesthesia problems Neg Hx   . Hypotension Neg Hx   . Malignant hyperthermia Neg Hx   . Pseudochol deficiency Neg Hx   . Other Neg Hx     History  Substance Use Topics  . Smoking status: Former Smoker -- 3 years    Types: Cigarettes    Quit date: 05/16/2010  . Smokeless tobacco: Never Used  . Alcohol Use: No    Allergies:  Allergies  Allergen Reactions  . Latex Rash    gloves    Prescriptions prior to admission  Medication Sig Dispense Refill  . acetaminophen (TYLENOL) 325 MG tablet Take 650 mg by mouth every 6 (six) hours as needed for pain (stomach cramps).      . ondansetron (ZOFRAN) 4 MG tablet Take 4 mg by mouth every 6 (six) hours as needed for nausea.      .Marland KitchenoxyCODONE-acetaminophen (ROXICET) 5-325 MG per tablet Take 1 tablet by mouth every 4 (four) hours as needed for pain.  15 tablet  0    Review of Systems  Constitutional: Negative for fever and malaise/fatigue.  Gastrointestinal: Positive for nausea and vomiting.  Negative for abdominal pain, diarrhea and constipation.  Genitourinary: Negative for dysuria, urgency and frequency.       Neg - vaginal bleeding, discharge   Physical Exam   Blood pressure 125/70, pulse 85, temperature 98.5 F (36.9 C), temperature source Oral, resp. rate 16, last menstrual period 03/12/2013.  Physical Exam  Constitutional: She appears well-developed and well-nourished. No distress.  HENT:  Head: Normocephalic and atraumatic.  Cardiovascular: Normal rate, regular rhythm and normal heart sounds.   Respiratory: Effort normal and breath sounds normal. No respiratory distress.  GI: Soft. Bowel sounds are normal. She exhibits no distension and no mass. There is no tenderness. There is no rebound and no guarding.  Neurological: She is alert.  Skin: Skin is warm and dry. No erythema.  Psychiatric: She has a normal mood and affect.   Results for orders placed during the hospital encounter of 05/12/13 (from the past 24 hour(s))  URINALYSIS, ROUTINE W REFLEX MICROSCOPIC     Status: None   Collection Time    05/12/13  3:52 PM      Result Value Range   Color, Urine YELLOW  YELLOW   APPearance CLEAR  CLEAR   Specific Gravity, Urine 1.020  1.005 - 1.030   pH 6.5  5.0 - 8.0   Glucose, UA NEGATIVE  NEGATIVE mg/dL   Hgb urine dipstick NEGATIVE  NEGATIVE   Bilirubin Urine NEGATIVE  NEGATIVE   Ketones, ur NEGATIVE  NEGATIVE mg/dL   Protein, ur NEGATIVE  NEGATIVE mg/dL   Urobilinogen, UA 0.2  0.0 - 1.0 mg/dL   Nitrite NEGATIVE  NEGATIVE   Leukocytes, UA NEGATIVE  NEGATIVE    MAU Course  Procedures None  MDM ODT Zofran given - patient reports improvement in symptoms and is able to tolerate PO in MAU today  Assessment and Plan  A: Nausea and vomiting in pregnancy prior to [redacted] weeks gestation  P: Discharge home Rx for Phenergan and Zofran sent to patient's pharmacy Patient advised to increase PO hydration as tolerated Patient given hyperemesis diet information on  AVS Patient may return to MAU as needed or if her condition were to change or worsen  Farris Has, PA-C  05/12/2013, 5:20 PM

## 2013-05-12 NOTE — MAU Provider Note (Signed)
Chart reviewed and agree with management and plan.

## 2013-06-05 ENCOUNTER — Encounter (HOSPITAL_COMMUNITY): Payer: Self-pay | Admitting: *Deleted

## 2013-06-05 ENCOUNTER — Inpatient Hospital Stay (HOSPITAL_COMMUNITY)
Admission: AD | Admit: 2013-06-05 | Discharge: 2013-06-05 | Disposition: A | Discharge status: Home / Self Care | Payer: Medicaid Other | Source: Ambulatory Visit | Attending: Obstetrics & Gynecology | Admitting: Obstetrics & Gynecology

## 2013-06-05 DIAGNOSIS — O99891 Other specified diseases and conditions complicating pregnancy: Secondary | ICD-10-CM | POA: Insufficient documentation

## 2013-06-05 DIAGNOSIS — R51 Headache: Secondary | ICD-10-CM | POA: Insufficient documentation

## 2013-06-05 DIAGNOSIS — O26899 Other specified pregnancy related conditions, unspecified trimester: Secondary | ICD-10-CM

## 2013-06-05 DIAGNOSIS — R1013 Epigastric pain: Secondary | ICD-10-CM | POA: Insufficient documentation

## 2013-06-05 DIAGNOSIS — R109 Unspecified abdominal pain: Secondary | ICD-10-CM

## 2013-06-05 MED ORDER — GI COCKTAIL ~~LOC~~
30.0000 mL | Freq: Once | ORAL | Status: AC
  Administered 2013-06-05: 30 mL via ORAL
  Filled 2013-06-05: qty 30

## 2013-06-05 MED ORDER — FAMOTIDINE 20 MG PO TABS: 20.0000 mg | ORAL_TABLET | Freq: Two times a day (BID) | ORAL | Status: DC

## 2013-06-05 NOTE — MAU Note (Addendum)
Pain started last night, pain in upper abd, left side.  Denies nausea or vomiting.  Pain started after she took [facility] powder for a headache.

## 2013-06-05 NOTE — MAU Note (Signed)
Hx of headaches;hx of ulcer; c/o having a terrible headache last night that kept her up all night; took a BC powder for the headache this AM; headache is gone but c/o cramping & stabbing epigastric pain now that pt rates @6 ;

## 2013-06-05 NOTE — MAU Provider Note (Addendum)
History     CSN: [id number]  Arrival date and time: 06/05/13 1053   None     Chief Complaint  Patient presents with  . Abdominal Cramping   HPI 27 yo G3P1011 at 69w1dwith history of ulcerative colitis, anemia and hyperthyroidism comes in with abdominal cramping.  Crampy, non-radiating, constant Pain started 10-20 after [facility] a [facility] this AM.  Had been taking Tylenol for pain before and first time she takes ibuprofen this week.  Pain is better when she is lying down and worse when she sits up.  Denies nausea,  Vomiting, rectal or vaginal bleeding, vaginal discharge, diarrhea or constipation, fever, chills,  dysuria, urinary frequency, and urinary urgency.     Pt was seen on 05/01/2013 in MAU with confirmed single living IUP 742w1dith left small CLC.  FHT audible with doppler today 156 bpm   Past Medical History  Diagnosis Date  . Anemia   . Chronic ulcerative colitis   . Blood transfusion   . Ulcerative colitis   . HeFGHWEXHB(716.9    Past Surgical History  Procedure Laterality Date  . Colon biopsy    . Tonsillectomy    . Dilation and evacuation  08/21/2012    Procedure: DILATATION AND EVACUATION;  Surgeon: KaLogan BoresMD;  [facility];  Service: Gynecology;  Laterality: N/A;    Family History  Problem Relation Age of Onset  . Anesthesia problems Neg Hx   . Hypotension Neg Hx   . Malignant hyperthermia Neg Hx   . Pseudochol deficiency Neg Hx   . Other Neg Hx   . Alcohol abuse Neg Hx   . Arthritis Neg Hx   . Asthma Neg Hx   . Birth defects Neg Hx   . Cancer Neg Hx   . COPD Neg Hx   . Depression Neg Hx   . Diabetes Neg Hx   . Drug abuse Neg Hx   . Early death Neg Hx   . Hearing loss Neg Hx   . Heart disease Neg Hx   . Hyperlipidemia Neg Hx   . Hypertension Neg Hx   . Kidney disease Neg Hx   . Learning disabilities Neg Hx   . Mental illness Neg Hx   . Mental retardation Neg Hx   . Miscarriages / Stillbirths Neg Hx   . Stroke  Neg Hx   . Vision loss Neg Hx     History  Substance Use Topics  . Smoking status: Former Smoker -- 3 years    Types: Cigarettes    Quit date: 05/16/2010  . Smokeless tobacco: Never Used  . Alcohol Use: No    Allergies:  Allergies  Allergen Reactions  . Latex Rash    gloves    Prescriptions prior to admission  Medication Sig Dispense Refill  . acetaminophen (TYLENOL) 325 MG tablet Take 650 mg by mouth every 6 (six) hours as needed for pain (stomach cramps).      . ondansetron (ZOFRAN) 4 MG tablet Take 4 mg by mouth every 6 (six) hours as needed for nausea.      . ondansetron (ZOFRAN) 4 MG tablet Take 1 tablet (4 mg total) by mouth every 6 (six) hours.  12 tablet  0  . oxyCODONE-acetaminophen (ROXICET) 5-325 MG per tablet Take 1 tablet by mouth every 4 (four) hours as needed for pain.  15 tablet  0  . promethazine (PHENERGAN) 25 MG tablet Take 1 tablet (25 mg total)  by mouth every 6 (six) hours as needed for nausea.  30 tablet  0    ROS As per HPI Physical Exam   Blood pressure 118/59, pulse 86, temperature 98.5 F (36.9 C), temperature source Oral, resp. rate 16, height 4' 10.5" (1.486 m), weight 63.776 kg (140 lb 9.6 oz), last menstrual period 03/12/2013.  Physical Exam  Constitutional: She is oriented to person, place, and time. She appears well-developed and well-nourished. No distress.  HENT:  Head: Normocephalic and atraumatic.  Cardiovascular: Normal rate, regular rhythm and normal heart sounds.  Exam reveals no friction rub.   No murmur heard. Respiratory: Effort normal and breath sounds normal. No respiratory distress. She has no wheezes. She has no rales. She exhibits no tenderness.  GI: Soft. She exhibits no mass. There is no rebound and no guarding.   Gaseous sounds on auscultation.  Mild tenderness in RUQ and LUQ.    Neurological: She is alert and oriented to person, place, and time.  Skin: Skin is warm and dry. She is not diaphoretic. No erythema.   Psychiatric: She has a normal mood and affect. Her behavior is normal. Judgment and thought content normal.   Fetal heart tones by doppler:  152bpm  Pelvic exam deferred since pain not in lower abd and confirmed IUP- no spotting or bleeding Labs:   MAU Course  Procedures  MDM UA  Recent Results (from the past 2160 hour(s))  CBC WITH DIFFERENTIAL     Status: Abnormal   Collection Time    03/18/13  7:00 PM      Result Value Range   WBC 8.5  4.0 - 10.5 K/uL   RBC 4.56  3.87 - 5.11 MIL/uL   Hemoglobin 14.5  12.0 - 15.0 g/dL   HCT 40.2  36.0 - 46.0 %   MCV 88.2  78.0 - 100.0 fL   MCH 31.8  26.0 - 34.0 pg   MCHC 36.1 (*) 30.0 - 36.0 g/dL   RDW 13.3  11.5 - 15.5 %   Platelets 331  150 - 400 K/uL   Neutrophils Relative % 45  43 - 77 %   Neutro Abs 3.8  1.7 - 7.7 K/uL   Lymphocytes Relative 39  12 - 46 %   Lymphs Abs 3.3  0.7 - 4.0 K/uL   Monocytes Relative 13 (*) 3 - 12 %   Monocytes Absolute 1.1 (*) 0.1 - 1.0 K/uL   Eosinophils Relative 3  0 - 5 %   Eosinophils Absolute 0.3  0.0 - 0.7 K/uL   Basophils Relative 0  0 - 1 %   Basophils Absolute 0.0  0.0 - 0.1 K/uL  COMPREHENSIVE METABOLIC PANEL     Status: Abnormal   Collection Time    03/18/13  7:00 PM      Result Value Range   Sodium 140  135 - 145 mEq/L   Potassium 4.2  3.5 - 5.1 mEq/L   Chloride 106  96 - 112 mEq/L   CO2 26  19 - 32 mEq/L   Glucose, Bld 113 (*) 70 - 99 mg/dL   BUN 8  6 - 23 mg/dL   Creatinine, Ser 0.52  0.50 - 1.10 mg/dL   Calcium 9.1  8.4 - 10.5 mg/dL   Total Protein 7.4  6.0 - 8.3 g/dL   Albumin 3.7  3.5 - 5.2 g/dL   AST 13  0 - 37 U/L   ALT 10  0 - 35 U/L   Alkaline Phosphatase  77  39 - 117 U/L   Total Bilirubin 0.2 (*) 0.3 - 1.2 mg/dL   GFR calc non Af Amer >90  >90 mL/min   GFR calc Af Amer >90  >90 mL/min   Comment:            The eGFR has been calculated     using the CKD EPI equation.     This calculation has not been     validated in all clinical     situations.     eGFR's persistently      <90 mL/min signify     possible Chronic Kidney Disease.  LIPASE, BLOOD     Status: None   Collection Time    03/18/13  7:00 PM      Result Value Range   Lipase 49  11 - 59 U/L  URINALYSIS, MICROSCOPIC ONLY     Status: Abnormal   Collection Time    03/18/13  7:29 PM      Result Value Range   Color, Urine YELLOW  YELLOW   APPearance CLEAR  CLEAR   Specific Gravity, Urine 1.021  1.005 - 1.030   pH 6.5  5.0 - 8.0   Glucose, UA NEGATIVE  NEGATIVE mg/dL   Hgb urine dipstick TRACE (*) NEGATIVE   Bilirubin Urine NEGATIVE  NEGATIVE   Ketones, ur NEGATIVE  NEGATIVE mg/dL   Protein, ur NEGATIVE  NEGATIVE mg/dL   Urobilinogen, UA 0.2  0.0 - 1.0 mg/dL   Nitrite NEGATIVE  NEGATIVE   Leukocytes, UA NEGATIVE  NEGATIVE   WBC, UA 0-2  <3 WBC/hpf   Squamous Epithelial / LPF MANY (*) RARE   Urine-Other MUCOUS PRESENT    PREGNANCY, URINE     Status: None   Collection Time    03/18/13  7:40 PM      Result Value Range   Preg Test, Ur NEGATIVE  NEGATIVE   Comment:            THE SENSITIVITY OF THIS     METHODOLOGY IS >20 mIU/mL.  URINALYSIS, ROUTINE W REFLEX MICROSCOPIC     Status: Abnormal   Collection Time    04/15/13  2:50 PM      Result Value Range   Color, Urine YELLOW  YELLOW   APPearance CLEAR  CLEAR   Specific Gravity, Urine 1.015  1.005 - 1.030   pH 6.0  5.0 - 8.0   Glucose, UA NEGATIVE  NEGATIVE mg/dL   Hgb urine dipstick NEGATIVE  NEGATIVE   Bilirubin Urine NEGATIVE  NEGATIVE   Ketones, ur 15 (*) NEGATIVE mg/dL   Protein, ur NEGATIVE  NEGATIVE mg/dL   Urobilinogen, UA 0.2  0.0 - 1.0 mg/dL   Nitrite NEGATIVE  NEGATIVE   Leukocytes, UA NEGATIVE  NEGATIVE   Comment: MICROSCOPIC NOT DONE ON URINES WITH NEGATIVE PROTEIN, BLOOD, LEUKOCYTES, NITRITE, OR GLUCOSE <1000 mg/dL.  POCT PREGNANCY, URINE     Status: Abnormal   Collection Time    04/15/13  2:58 PM      Result Value Range   Preg Test, Ur POSITIVE (*) NEGATIVE   Comment:            THE SENSITIVITY OF THIS      METHODOLOGY IS >24 mIU/mL  CBC     Status: None   Collection Time    04/15/13  3:20 PM      Result Value Range   WBC 8.7  4.0 - 10.5 K/uL   RBC 4.30  3.87 - 5.11 MIL/uL   Hemoglobin 13.3  12.0 - 15.0 g/dL   HCT 38.9  36.0 - 46.0 %   MCV 90.5  78.0 - 100.0 fL   MCH 30.9  26.0 - 34.0 pg   MCHC 34.2  30.0 - 36.0 g/dL   RDW 14.1  11.5 - 15.5 %   Platelets 306  150 - 400 K/uL  HCG, QUANTITATIVE, PREGNANCY     Status: Abnormal   Collection Time    04/15/13  3:20 PM      Result Value Range   hCG, Beta Chain, Quant, S 3628 (*) <5 mIU/mL   Comment:              GEST. AGE      CONC.  (mIU/mL)       <=1 WEEK        5 - 50         2 WEEKS       50 - 500         3 WEEKS       100 - 10,000         4 WEEKS     1,000 - 30,000         5 WEEKS     3,500 - 115,000       6-8 WEEKS     12,000 - 270,000        12 WEEKS     15,000 - 220,000                FEMALE AND NON-PREGNANT FEMALE:         LESS THAN 5 mIU/mL  HCG, QUANTITATIVE, PREGNANCY     Status: Abnormal   Collection Time    04/17/13 12:10 PM      Result Value Range   hCG, Beta Chain, Quant, S 6985 (*) <5 mIU/mL   Comment:              GEST. AGE      CONC.  (mIU/mL)       <=1 WEEK        5 - 50         2 WEEKS       50 - 500         3 WEEKS       100 - 10,000         4 WEEKS     1,000 - 30,000         5 WEEKS     3,500 - 115,000       6-8 WEEKS     12,000 - 270,000        12 WEEKS     15,000 - 220,000                FEMALE AND NON-PREGNANT FEMALE:         LESS THAN 5 mIU/mL  URINALYSIS, ROUTINE W REFLEX MICROSCOPIC     Status: Abnormal   Collection Time    04/21/13 11:50 PM      Result Value Range   Color, Urine YELLOW  YELLOW   APPearance CLEAR  CLEAR   Specific Gravity, Urine <1.005 (*) 1.005 - 1.030   pH 5.5  5.0 - 8.0   Glucose, UA NEGATIVE  NEGATIVE mg/dL   Hgb urine dipstick NEGATIVE  NEGATIVE   Bilirubin Urine NEGATIVE  NEGATIVE   Ketones, ur NEGATIVE  NEGATIVE mg/dL   Protein, ur NEGATIVE  NEGATIVE mg/dL    Urobilinogen, UA 0.2  0.0 - 1.0 mg/dL   Nitrite NEGATIVE  NEGATIVE   Leukocytes, UA NEGATIVE  NEGATIVE   Comment: MICROSCOPIC NOT DONE ON URINES WITH NEGATIVE PROTEIN, BLOOD, LEUKOCYTES, NITRITE, OR GLUCOSE <1000 mg/dL.  HCG, QUANTITATIVE, PREGNANCY     Status: Abnormal   Collection Time    04/22/13 12:35 AM      Result Value Range   hCG, Beta Chain, Quant, S 19072 (*) <5 mIU/mL   Comment:              GEST. AGE      CONC.  (mIU/mL)       <=1 WEEK        5 - 50         2 WEEKS       50 - 500         3 WEEKS       100 - 10,000         4 WEEKS     1,000 - 30,000         5 WEEKS     3,500 - 115,000       6-8 WEEKS     12,000 - 270,000        12 WEEKS     15,000 - 220,000                FEMALE AND NON-PREGNANT FEMALE:         LESS THAN 5 mIU/mL  CBC     Status: None   Collection Time    04/22/13 12:35 AM      Result Value Range   WBC 8.5  4.0 - 10.5 K/uL   RBC 4.17  3.87 - 5.11 MIL/uL   Hemoglobin 13.0  12.0 - 15.0 g/dL   HCT 37.6  36.0 - 46.0 %   MCV 90.2  78.0 - 100.0 fL   MCH 31.2  26.0 - 34.0 pg   MCHC 34.6  30.0 - 36.0 g/dL   RDW 13.9  11.5 - 15.5 %   Platelets 313  150 - 400 K/uL  URINALYSIS, ROUTINE W REFLEX MICROSCOPIC     Status: None   Collection Time    05/12/13  3:52 PM      Result Value Range   Color, Urine YELLOW  YELLOW   APPearance CLEAR  CLEAR   Specific Gravity, Urine 1.020  1.005 - 1.030   pH 6.5  5.0 - 8.0   Glucose, UA NEGATIVE  NEGATIVE mg/dL   Hgb urine dipstick NEGATIVE  NEGATIVE   Bilirubin Urine NEGATIVE  NEGATIVE   Ketones, ur NEGATIVE  NEGATIVE mg/dL   Protein, ur NEGATIVE  NEGATIVE mg/dL   Urobilinogen, UA 0.2  0.0 - 1.0 mg/dL   Nitrite NEGATIVE  NEGATIVE   Leukocytes, UA NEGATIVE  NEGATIVE   Comment: MICROSCOPIC NOT DONE ON URINES WITH NEGATIVE PROTEIN, BLOOD, LEUKOCYTES, NITRITE, OR GLUCOSE <1000 mg/dL.   Given GI cocktail with relief of pain  Assessment and Plan  A:   Single living IUP Upper abdominal cramping relieved with GI  cocktail- will send pt home with Pepcid   P:f/u with provider of choice for OB care Pt seen with Carie Caddy PA student- I agree with management and saw pt with PA student, -Discussed avoidance of NSAIDs for pain management     Makalyn Lennox 06/05/2013, 11:35 AM

## 2013-06-10 NOTE — MAU Provider Note (Signed)
Attestation of Attending Supervision of Advanced Practitioner (CNM/NP): Evaluation and management procedures were performed by the Advanced Practitioner under my supervision and collaboration. I have reviewed the Advanced Practitioner's note and chart, and I agree with the management and plan.  Jaryd Drew H. 11:00 AM

## 2013-06-11 NOTE — MAU Provider Note (Signed)
Attestation of Attending Supervision of Advanced Practitioner (CNM/NP): Evaluation and management procedures were performed by the Advanced Practitioner under my supervision and collaboration. I have reviewed the Advanced Practitioner's note and chart, and I agree with the management and plan.  Loeta Herst H. 11:53 AM

## 2013-07-03 LAB — OB RESULTS CONSOLE RPR: RPR: NONREACTIVE

## 2013-07-03 LAB — OB RESULTS CONSOLE ABO/RH: RH TYPE: POSITIVE

## 2013-07-03 LAB — OB RESULTS CONSOLE HIV ANTIBODY (ROUTINE TESTING): HIV: NONREACTIVE

## 2013-07-03 LAB — OB RESULTS CONSOLE HEPATITIS B SURFACE ANTIGEN: HEP B S AG: NEGATIVE

## 2013-07-03 LAB — OB RESULTS CONSOLE ANTIBODY SCREEN: ANTIBODY SCREEN: NEGATIVE

## 2013-07-03 LAB — OB RESULTS CONSOLE GC/CHLAMYDIA
Chlamydia: NEGATIVE
GC PROBE AMP, GENITAL: NEGATIVE

## 2013-07-31 ENCOUNTER — Encounter (HOSPITAL_COMMUNITY): Payer: Self-pay | Admitting: *Deleted

## 2013-07-31 ENCOUNTER — Inpatient Hospital Stay (HOSPITAL_COMMUNITY)
Admission: AD | Admit: 2013-07-31 | Discharge: 2013-07-31 | Disposition: A | Discharge status: Home / Self Care | Payer: Medicaid Other | Source: Ambulatory Visit | Attending: Obstetrics and Gynecology | Admitting: Obstetrics and Gynecology

## 2013-07-31 DIAGNOSIS — R109 Unspecified abdominal pain: Secondary | ICD-10-CM | POA: Insufficient documentation

## 2013-07-31 DIAGNOSIS — N949 Unspecified condition associated with female genital organs and menstrual cycle: Secondary | ICD-10-CM | POA: Insufficient documentation

## 2013-07-31 DIAGNOSIS — O99891 Other specified diseases and conditions complicating pregnancy: Secondary | ICD-10-CM | POA: Insufficient documentation

## 2013-07-31 LAB — URINALYSIS, ROUTINE W REFLEX MICROSCOPIC
Bilirubin Urine: NEGATIVE
Glucose, UA: NEGATIVE mg/dL
Hgb urine dipstick: NEGATIVE
Specific Gravity, Urine: 1.015 (ref 1.005–1.030)
Urobilinogen, UA: 0.2 mg/dL (ref 0.0–1.0)

## 2013-07-31 NOTE — MAU Note (Signed)
Patient states she has been having vaginal pain for a while but worse for the past few days. Denies bleeding, leaking or discharge. Has not felt fetal movement yet.

## 2013-07-31 NOTE — MAU Provider Note (Signed)
History     CSN: [id number]  Arrival date and time: 07/31/13 1357   First Provider Initiated Contact with Patient 07/31/13 1436      Chief Complaint  Patient presents with  . Vaginal Pain   HPI  Pt is a G3P1011 at 46w1dweeks with report of vaginal pain that started two weeks ago and increased in intensity in the past few days. Pain is described as sharp.  Denies bleeding, leaking or discharge. Has not felt fetal movement yet. Pain increases with movement or getting to standing position.     Past Medical History  Diagnosis Date  . Anemia   . Chronic ulcerative colitis   . Blood transfusion   . Ulcerative colitis   . HDGUYQIHK(742.5     Past Surgical History  Procedure Laterality Date  . Colon biopsy    . Tonsillectomy    . Dilation and evacuation  08/21/2012    Procedure: DILATATION AND EVACUATION;  Surgeon: KLogan Bores MD;  [facility];  Service: Gynecology;  Laterality: N/A;    Family History  Problem Relation Age of Onset  . Anesthesia problems Neg Hx   . Hypotension Neg Hx   . Malignant hyperthermia Neg Hx   . Pseudochol deficiency Neg Hx   . Other Neg Hx   . Alcohol abuse Neg Hx   . Arthritis Neg Hx   . Asthma Neg Hx   . Birth defects Neg Hx   . Cancer Neg Hx   . COPD Neg Hx   . Depression Neg Hx   . Diabetes Neg Hx   . Drug abuse Neg Hx   . Early death Neg Hx   . Hearing loss Neg Hx   . Heart disease Neg Hx   . Hyperlipidemia Neg Hx   . Hypertension Neg Hx   . Kidney disease Neg Hx   . Learning disabilities Neg Hx   . Mental illness Neg Hx   . Mental retardation Neg Hx   . Miscarriages / Stillbirths Neg Hx   . Stroke Neg Hx   . Vision loss Neg Hx     History  Substance Use Topics  . Smoking status: Former Smoker -- 3 years    Types: Cigarettes    Quit date: 05/16/2010  . Smokeless tobacco: Never Used  . Alcohol Use: No    Allergies:  Allergies  Allergen Reactions  . Latex Rash    gloves    Prescriptions prior to  admission  Medication Sig Dispense Refill  . acetaminophen (TYLENOL) 325 MG tablet Take 650 mg by mouth every 6 (six) hours as needed for pain.       . Prenatal Vit-Fe Fumarate-FA (PRENATAL MULTIVITAMIN) TABS tablet Take 1 tablet by mouth every evening.        Review of Systems  Gastrointestinal: Positive for abdominal pain (low left groin pain).  All other systems reviewed and are negative.   Physical Exam   Blood pressure 105/61, pulse 86, temperature 98 F (36.7 C), temperature source Oral, resp. rate 16, height 4' 10.25" (1.48 m), weight 65.499 kg (144 lb 6.4 oz), last menstrual period 03/12/2013, SpO2 100.00%.  Physical Exam  Constitutional: She is oriented to person, place, and time. She appears well-developed and well-nourished. No distress.  HENT:  Head: Normocephalic.  Neck: Normal range of motion. Neck supple.  Cardiovascular: Normal rate, regular rhythm and normal heart sounds.   Respiratory: Effort normal and breath sounds normal.  GI: Soft. There is no tenderness.  Genitourinary: No bleeding around the vagina. Vaginal discharge (mucusy) found.  Neurological: She is alert and oriented to person, place, and time.  Skin: Skin is warm and dry.   Dilation: Closed Effacement (%): Thick Cervical Position: Posterior Exam by:: Loa Socks, CNM  MAU Course  Procedures  Results for orders placed during the hospital encounter of 07/31/13 (from the past 24 hour(s))  URINALYSIS, ROUTINE W REFLEX MICROSCOPIC     Status: None   Collection Time    07/31/13  2:10 PM      Result Value Range   Color, Urine YELLOW  YELLOW   APPearance CLEAR  CLEAR   Specific Gravity, Urine 1.015  1.005 - 1.030   pH 7.0  5.0 - 8.0   Glucose, UA NEGATIVE  NEGATIVE mg/dL   Hgb urine dipstick NEGATIVE  NEGATIVE   Bilirubin Urine NEGATIVE  NEGATIVE   Ketones, ur NEGATIVE  NEGATIVE mg/dL   Protein, ur NEGATIVE  NEGATIVE mg/dL   Urobilinogen, UA 0.2  0.0 - 1.0 mg/dL   Nitrite NEGATIVE  NEGATIVE    Leukocytes, UA NEGATIVE  NEGATIVE   FHR 152  Assessment and Plan  Round Ligament Pain Reassuring FHR  Plan: Discharge to home Keep scheduled appointment  [facility] 07/31/2013, 2:39 PM

## 2013-08-12 ENCOUNTER — Encounter: Payer: Self-pay | Admitting: Gastroenterology

## 2013-08-15 ENCOUNTER — Emergency Department (INDEPENDENT_AMBULATORY_CARE_PROVIDER_SITE_OTHER)
Admission: EM | Admit: 2013-08-15 | Discharge: 2013-08-15 | Disposition: A | Discharge status: Home / Self Care | Payer: Medicaid Other | Source: Home / Self Care | Attending: Emergency Medicine | Admitting: Emergency Medicine

## 2013-08-15 ENCOUNTER — Encounter (HOSPITAL_COMMUNITY): Payer: Self-pay | Admitting: *Deleted

## 2013-08-15 DIAGNOSIS — T162XXA Foreign body in left ear, initial encounter: Secondary | ICD-10-CM

## 2013-08-15 DIAGNOSIS — T169XXA Foreign body in ear, unspecified ear, initial encounter: Secondary | ICD-10-CM

## 2013-08-15 NOTE — ED Notes (Signed)
Pt reports that she was cleaning her left ear and got a piece of the Q-tip stuck in it. Pt denies pain.

## 2013-08-15 NOTE — ED Provider Notes (Signed)
Chief Complaint:   Chief Complaint  Patient presents with  . Foreign Body in Ear    History of Present Illness:   Joyce Solomon is a 27 year old female who got the cotton tip of a Q-tip stuck in her left ear canal this afternoon while cleaning her ears. She denies any pain or drainage. She denies any other ENT symptoms such as headache, nasal congestion, rhinorrhea, sore throat, adenopathy, or cough.  Review of Systems:  Other than noted above, the patient denies any of the following symptoms: Systemic:  No fevers, chills, sweats, weight loss or gain, fatigue, or tiredness. Eye:  No redness, pain, discharge, itching, blurred vision, or diplopia. ENT:  No headache, nasal congestion, sneezing, itching, epistaxis, ear pain, congestion, decreased hearing, ringing in ears, vertigo, or tinnitus.  No oral lesions, sore throat, pain on swallowing, or hoarseness. Neck:  No mass, tenderness or adenopathy. Lungs:  No coughing, wheezing, or shortness of breath. Skin:  No rash or itching.  [place]:  Past medical history, family history, social history, meds, and allergies were reviewed. She is 6 months pregnant. No problems with the pregnancy.  Physical Exam:   Vital signs:  BP 112/73  Pulse 89  Temp(Src) 98.2 F (36.8 C) (Oral)  SpO2 100%  LMP 03/12/2013  Breastfeeding? No General:  Alert and oriented.  In no distress.  Skin warm and dry. Eye:  PERRL, full EOMs, lids and conjunctiva normal.   ENT:  There was a cotton foreign body in the left ear canal. Canal otherwise appears normal. Tympanic membrane was not seen. Right TM and canal are normal.  Nasal mucosa not congested and without drainage.  Mucous membranes moist, no oral lesions, normal dentition, pharynx clear.  No cranial or facial pain to palplation. Neck:  Supple, full ROM.  No adenopathy, tenderness or mass.  Thyroid normal. Lungs:  Breath sounds clear and equal bilaterally.  No wheezes, rales or rhonchi. Heart:  Rhythm regular,  without extrasystoles.  No gallops or murmers. Skin:  Clear, warm and dry.  Procedure Note:  Verbal informed consent was obtained from the patient.  Risks and benefits were outlined with the patient.  Patient understands and accepts these risks.  Identity of the patient was confirmed verbally and by armband.    Procedure was performed as follows:  Under direct visualization with a headlamp, an alligator forceps was used and the foreign body was grasped and removed. Thereafter the canal and TM appeared normal. The patient tolerated this procedure well and did not have any distress.  Patient tolerated the procedure well without any immediate complications.  Assessment:  The encounter diagnosis was Foreign body in ear, left, initial encounter.  Foreign body successfully removed.  Plan:   1.  Meds:  The following meds were prescribed:   Discharge Medication List as of 08/15/2013  9:13 PM      2.  Patient Education/Counseling:  The patient was given appropriate handouts, self care instructions, and instructed in symptomatic relief.  Patient encouraged not to use Q-tips to clean ears.  3.  Follow up:  The patient was told to follow up if no better in 3 to 4 days, if becoming worse in any way, and given some red flag symptoms such as ear pain  which would prompt immediate return.  Follow up here if needed.     Harden Mo, MD 08/15/13 2149

## 2013-09-07 ENCOUNTER — Inpatient Hospital Stay (HOSPITAL_COMMUNITY)
Admission: AD | Admit: 2013-09-07 | Discharge: 2013-09-07 | Disposition: A | Discharge status: Home / Self Care | Payer: Medicaid Other | Source: Ambulatory Visit | Attending: Obstetrics and Gynecology | Admitting: Obstetrics and Gynecology

## 2013-09-07 ENCOUNTER — Encounter (HOSPITAL_COMMUNITY): Payer: Self-pay | Admitting: Family

## 2013-09-07 DIAGNOSIS — O26899 Other specified pregnancy related conditions, unspecified trimester: Secondary | ICD-10-CM

## 2013-09-07 DIAGNOSIS — K5190 Ulcerative colitis, unspecified, without complications: Secondary | ICD-10-CM | POA: Insufficient documentation

## 2013-09-07 DIAGNOSIS — O99891 Other specified diseases and conditions complicating pregnancy: Secondary | ICD-10-CM | POA: Insufficient documentation

## 2013-09-07 LAB — COMPREHENSIVE METABOLIC PANEL
ALT: 6 U/L (ref 0–35)
AST: 12 U/L (ref 0–37)
Albumin: 2.7 g/dL — ABNORMAL LOW (ref 3.5–5.2)
Alkaline Phosphatase: 74 U/L (ref 39–117)
BUN: 4 mg/dL — ABNORMAL LOW (ref 6–23)
CO2: 21 mEq/L (ref 19–32)
Calcium: 8.3 mg/dL — ABNORMAL LOW (ref 8.4–10.5)
Chloride: 105 mEq/L (ref 96–112)
Creatinine, Ser: 0.4 mg/dL — ABNORMAL LOW (ref 0.50–1.10)
GFR calc Af Amer: >90 mL/min (ref >90)
GFR calc non Af Amer: >90 mL/min (ref >90)
Glucose, Bld: 88 mg/dL (ref 70–99)
Potassium: 3.5 mEq/L (ref 3.5–5.1)
Sodium: 134 mEq/L — ABNORMAL LOW (ref 135–145)
Total Bilirubin: 0.1 mg/dL — ABNORMAL LOW (ref 0.3–1.2)
Total Protein: 6.5 g/dL (ref 6.0–8.3)

## 2013-09-07 LAB — CBC
HCT: 34.9 % — ABNORMAL LOW (ref 36.0–46.0)
Hemoglobin: 12.2 g/dL (ref 12.0–15.0)
MCH: 31.3 pg (ref 26.0–34.0)
MCHC: 35 g/dL (ref 30.0–36.0)
MCV: 89.5 fL (ref 78.0–100.0)
Platelets: 250 10*3/uL (ref 150–400)
RBC: 3.9 MIL/uL (ref 3.87–5.11)
RDW: 14 % (ref 11.5–15.5)
WBC: 9.5 10*3/uL (ref 4.0–10.5)

## 2013-09-07 LAB — URINALYSIS, ROUTINE W REFLEX MICROSCOPIC
Bilirubin Urine: NEGATIVE
Glucose, UA: NEGATIVE mg/dL
Hgb urine dipstick: NEGATIVE
Ketones, ur: NEGATIVE mg/dL
Leukocytes, UA: NEGATIVE
Nitrite: NEGATIVE
Protein, ur: NEGATIVE mg/dL
Specific Gravity, Urine: 1.02 (ref 1.005–1.030)
Urobilinogen, UA: 0.2 mg/dL (ref 0.0–1.0)
pH: 6.5 (ref 5.0–8.0)

## 2013-09-07 MED ORDER — PREDNISONE 10 MG PO TABS: 20.0000 mg | ORAL_TABLET | Freq: Every day | ORAL | Status: DC

## 2013-09-07 MED ORDER — PREDNISONE 20 MG PO TABS
20.0000 mg | ORAL_TABLET | Freq: Once | ORAL | Status: AC
  Administered 2013-09-07: 20 mg via ORAL
  Filled 2013-09-07: qty 1

## 2013-09-07 NOTE — MAU Provider Note (Signed)
History     CSN: [id number]  Arrival date and time: 09/07/13 1324   First Provider Initiated Contact with Patient 09/07/13 [phone]      No chief complaint on file.  HPI  RN note: 27 yo, G3P1 at [redacted]w[redacted]d presents to MAU with c/o flair up of UC; reports last visit to Dr.Ganem (GI) was 2 years ago. Reports 5 episodes of bloody stools in last 24 hrs.  Denies VB, LOF, contractions. Reports +FM. Denies any medication use.  Pt was last seen in the office 2 weeks ago and has not had any problems until this flare up of ulcerative colitis.    Past Medical History  Diagnosis Date  . Anemia   . Chronic ulcerative colitis   . Blood transfusion   . Ulcerative colitis   . HUVOZDGUY(403.4     Past Surgical History  Procedure Laterality Date  . Colon biopsy    . Tonsillectomy    . Dilation and evacuation  08/21/2012    Procedure: DILATATION AND EVACUATION;  Surgeon: KLogan Bores MD;  Location: [facility];  Service: Gynecology;  Laterality: N/A;    Family History  Problem Relation Age of Onset  . Anesthesia problems Neg Hx   . Hypotension Neg Hx   . Malignant hyperthermia Neg Hx   . Pseudochol deficiency Neg Hx   . Other Neg Hx   . Alcohol abuse Neg Hx   . Arthritis Neg Hx   . Asthma Neg Hx   . Birth defects Neg Hx   . Cancer Neg Hx   . COPD Neg Hx   . Depression Neg Hx   . Diabetes Neg Hx   . Drug abuse Neg Hx   . Early death Neg Hx   . Hearing loss Neg Hx   . Heart disease Neg Hx   . Hyperlipidemia Neg Hx   . Hypertension Neg Hx   . Kidney disease Neg Hx   . Learning disabilities Neg Hx   . Mental illness Neg Hx   . Mental retardation Neg Hx   . Miscarriages / Stillbirths Neg Hx   . Stroke Neg Hx   . Vision loss Neg Hx     History  Substance Use Topics  . Smoking status: Former Smoker -- 3 years    Types: Cigarettes    Quit date: 05/16/2010  . Smokeless tobacco: Never Used  . Alcohol Use: No    Allergies:  Allergies  Allergen Reactions  . Latex Rash   gloves    Prescriptions prior to admission  Medication Sig Dispense Refill  . Prenatal Vit-Fe Fumarate-FA (PRENATAL MULTIVITAMIN) TABS tablet Take 1 tablet by mouth every evening.        Review of Systems  Constitutional: Negative for fever and chills.  Gastrointestinal: Positive for abdominal pain and diarrhea. Negative for nausea and vomiting.  Genitourinary: Negative for dysuria.   Physical Exam   Last menstrual period 03/12/2013, not currently breastfeeding.  Physical Exam  Nursing note and vitals reviewed. Constitutional: She is oriented to person, place, and time. She appears well-developed and well-nourished. No distress.  HENT:  Head: Normocephalic.  Eyes: Pupils are equal, round, and reactive to light.  Neck: Normal range of motion.  Cardiovascular: Normal rate.   Respiratory: Effort normal.  GI: Soft. Bowel sounds are normal. She exhibits no distension. There is no tenderness.  FHR 130 bpm with 15x15 accelerations; no ctx noted  Musculoskeletal: Normal range of motion.  Neurological: She is alert and oriented to  person, place, and time.  Skin: Skin is warm and dry.  Psychiatric: She has a normal mood and affect.   Discussed with Dr. Willis Modena- will start pt on Prednisone 50m daily  MAU Course  Procedures Results for orders placed during the hospital encounter of 09/07/13 (from the past 24 hour(s))  URINALYSIS, ROUTINE W REFLEX MICROSCOPIC     Status: None   Collection Time    09/07/13  8:05 AM      Result Value Range   Color, Urine YELLOW  YELLOW   APPearance CLEAR  CLEAR   Specific Gravity, Urine 1.020  1.005 - 1.030   pH 6.5  5.0 - 8.0   Glucose, UA NEGATIVE  NEGATIVE mg/dL   Hgb urine dipstick NEGATIVE  NEGATIVE   Bilirubin Urine NEGATIVE  NEGATIVE   Ketones, ur NEGATIVE  NEGATIVE mg/dL   Protein, ur NEGATIVE  NEGATIVE mg/dL   Urobilinogen, UA 0.2  0.0 - 1.0 mg/dL   Nitrite NEGATIVE  NEGATIVE   Leukocytes, UA NEGATIVE  NEGATIVE  CBC     Status:  Abnormal   Collection Time    09/07/13  8:41 AM      Result Value Range   WBC 9.5  4.0 - 10.5 K/uL   RBC 3.90  3.87 - 5.11 MIL/uL   Hemoglobin 12.2  12.0 - 15.0 g/dL   HCT 34.9 (*) 36.0 - 46.0 %   MCV 89.5  78.0 - 100.0 fL   MCH 31.3  26.0 - 34.0 pg   MCHC 35.0  30.0 - 36.0 g/dL   RDW 14.0  11.5 - 15.5 %   Platelets 250  150 - 400 K/uL  COMPREHENSIVE METABOLIC PANEL     Status: Abnormal (Preliminary result)   Collection Time    09/07/13  8:41 AM      Result Value Range   Sodium 134 (*) 135 - 145 mEq/L   Potassium 3.5  3.5 - 5.1 mEq/L   Chloride 105  96 - 112 mEq/L   CO2 21  19 - 32 mEq/L   Glucose, Bld 88  70 - 99 mg/dL   BUN 4 (*) 6 - 23 mg/dL   Creatinine, Ser 0.40 (*) 0.50 - 1.10 mg/dL   Calcium 8.3 (*) 8.4 - 10.5 mg/dL   Total Protein 6.5  6.0 - 8.3 g/dL   Albumin 2.7 (*) 3.5 - 5.2 g/dL   AST 12  0 - 37 U/L   ALT PENDING  0 - 35 U/L   Alkaline Phosphatase 74  39 - 117 U/L   Total Bilirubin 0.1 (*) 0.3 - 1.2 mg/dL   GFR calc non Af Amer >90  >90 mL/min   GFR calc Af Amer >90  >90 mL/min  discussed with Dr. MWillis ModenaWill start prednisone 22mdaily Pt to call office in the morning to see if appointment with GI Dr. KaDeatra Inaeeds to be earlier  Assessment and Plan  Ulcerative colitis in pregnancy Prednisone 2031maily- 1st dose given in MAU Call office in the morning to see if treatment management or appointment needs to be changed  Breena Bevacqua 09/07/2013, 8:39 AM

## 2013-09-07 NOTE — MAU Note (Signed)
27 yo, G3P1 at [redacted]w[redacted]d presents to MAU with c/o flair up of UC; reports last visit to Dr.Ganem (GI) was 2 years ago. Reports 5 episodes of bloody stools in last 24 hrs.  Denies VB, LOF, contractions. Reports +FM. Denies any medication use.

## 2013-09-08 NOTE — Progress Notes (Signed)
FHT from 10-19 reviewed.  Reactive NST especially for 25 weeks, no significant ctx.

## 2013-09-11 ENCOUNTER — Ambulatory Visit: Payer: Medicaid Other | Admitting: Gastroenterology

## 2013-10-14 ENCOUNTER — Encounter (HOSPITAL_COMMUNITY): Payer: Self-pay

## 2013-10-14 ENCOUNTER — Inpatient Hospital Stay (HOSPITAL_COMMUNITY)
Admission: AD | Admit: 2013-10-14 | Discharge: 2013-10-14 | Disposition: A | Discharge status: Home / Self Care | Payer: Medicaid Other | Source: Ambulatory Visit | Attending: Obstetrics and Gynecology | Admitting: Obstetrics and Gynecology

## 2013-10-14 DIAGNOSIS — K529 Noninfective gastroenteritis and colitis, unspecified: Secondary | ICD-10-CM

## 2013-10-14 DIAGNOSIS — O212 Late vomiting of pregnancy: Secondary | ICD-10-CM | POA: Insufficient documentation

## 2013-10-14 DIAGNOSIS — O99891 Other specified diseases and conditions complicating pregnancy: Secondary | ICD-10-CM | POA: Insufficient documentation

## 2013-10-14 DIAGNOSIS — K5289 Other specified noninfective gastroenteritis and colitis: Secondary | ICD-10-CM | POA: Insufficient documentation

## 2013-10-14 DIAGNOSIS — R109 Unspecified abdominal pain: Secondary | ICD-10-CM | POA: Insufficient documentation

## 2013-10-14 HISTORY — DX: Encounter for other specified aftercare: Z51.89

## 2013-10-14 LAB — URINALYSIS, ROUTINE W REFLEX MICROSCOPIC
Glucose, UA: NEGATIVE mg/dL
Hgb urine dipstick: NEGATIVE
Leukocytes, UA: NEGATIVE
Protein, ur: NEGATIVE mg/dL
Specific Gravity, Urine: <1.005 — ABNORMAL LOW (ref 1.005–1.030)
pH: 6 (ref 5.0–8.0)

## 2013-10-14 MED ORDER — ONDANSETRON 8 MG PO TBDP
8.0000 mg | ORAL_TABLET | Freq: Once | ORAL | Status: AC
  Administered 2013-10-14: 8 mg via ORAL
  Filled 2013-10-14: qty 1

## 2013-10-14 MED ORDER — ONDANSETRON 4 MG PO TBDP: 4.0000 mg | ORAL_TABLET | Freq: Three times a day (TID) | ORAL | Status: DC | PRN

## 2013-10-14 NOTE — MAU Provider Note (Signed)
Chief Complaint:  Emesis During Pregnancy and Abdominal Pain  First Provider Initiated Contact with Patient 10/14/13 [phone]     HPI: Joyce Solomon is a 27 y.o. G3P1011 at 16w6dwho presents to maternity admissions reporting waking up at 0400 and vomiting and continued nausea. Reported UR in that she had abdominal pain, but told CNM that she had "a tummy ache". When asked to elaborate she stated she felt as if she was going to vomit. Denies contractions or any other low abdominal pain, leakage of fluid or vaginal bleeding. Good fetal movement. Reports contact with sick niece, but does not know what her symptoms are.  Pregnancy Course: Uncomplicated.  Past Medical History: Past Medical History  Diagnosis Date  . Anemia   . Chronic ulcerative colitis   . Blood transfusion   . Ulcerative colitis   . Headache(784.0)   . Blood transfusion without reported diagnosis     "year ago" b/c of anemia & UC    Past obstetric history: OB History  Gravida Para Term Preterm AB SAB TAB Ectopic Multiple Living  3 1 1  1 1    1     # Outcome Date GA Lbr Len/2nd Weight Sex Delivery Anes PTL Lv  3 CUR           2 TRM 10/21/11 37w0d0:42 / 00:33 2.489 kg (5 lb 7.8 oz) F SVD EPI  Y     Comments: no dysmorphic features; apnea secondary to tight nuchal cord and acute vascular depletion; IUGR not evident clinically; see consult note.  1 SAB               Past Surgical History: Past Surgical History  Procedure Laterality Date  . Colon biopsy    . Tonsillectomy    . Dilation and evacuation  08/21/2012    Procedure: DILATATION AND EVACUATION;  Surgeon: KaLogan BoresMD;  [facility];  Service: Gynecology;  Laterality: N/A;     Family History: Family History  No significant family medical history Relation Age of Onset    Social History: History  Substance Use Topics  . Smoking status: Former Smoker -- 3 years    Types: Cigarettes    Quit date: 05/16/2010  . Smokeless tobacco: Never  Used  . Alcohol Use: No    Allergies:  Allergies  Allergen Reactions  . Latex Rash    gloves    Meds:  Prescriptions prior to admission  Medication Sig Dispense Refill  . acetaminophen (TYLENOL) 325 MG tablet Take 650 mg by mouth every 6 (six) hours as needed.      . Prenatal Vit-Fe Fumarate-FA (PRENATAL MULTIVITAMIN) TABS tablet Take 1 tablet by mouth every evening.      . predniSONE (DELTASONE) 10 MG tablet Take 2 tablets (20 mg total) by mouth daily.  15 tablet  0    ROS: Pertinent findings in history of present illness. Negative for fever, chills, contractions, vaginal bleeding, vaginal discharge, urinary complaints, diarrhea, constipation.  Physical Exam  Blood pressure 127/81, pulse 120, temperature 98.7 F (37.1 C), temperature source Oral, resp. rate 20, height 4' 11"  (1.499 m), weight 68.856 kg (151 lb 12.8 oz), last menstrual period 03/12/2013, SpO2 99.00%. GENERAL: Well-developed, well-nourished female in no acute distress.  HEENT: normocephalic. Mucus membranes moist HEART: normal rate RESP: normal effort ABDOMEN: Soft, non-tender, gravid appropriate for gestational age EXTREMITIES: Nontender, no edema NEURO: alert and oriented SPECULUM EXAM: Deferred   FHT:  Baseline 140 , moderate variability, accelerations  present, no decelerations Contractions: none   Labs: Results for orders placed during the hospital encounter of 10/14/13 (from the past 24 hour(s))  URINALYSIS, ROUTINE W REFLEX MICROSCOPIC     Status: Abnormal   Collection Time    10/14/13  6:41 AM      Result Value Range   Color, Urine YELLOW  YELLOW   APPearance CLEAR  CLEAR   Specific Gravity, Urine <1.005 (*) 1.005 - 1.030   pH 6.0  5.0 - 8.0   Glucose, UA NEGATIVE  NEGATIVE mg/dL   Hgb urine dipstick NEGATIVE  NEGATIVE   Bilirubin Urine NEGATIVE  NEGATIVE   Ketones, ur NEGATIVE  NEGATIVE mg/dL   Protein, ur NEGATIVE  NEGATIVE mg/dL   Urobilinogen, UA 0.2  0.0 - 1.0 mg/dL   Nitrite NEGATIVE   NEGATIVE   Leukocytes, UA NEGATIVE  NEGATIVE   Imaging:  No results found. MAU Course: Zofran ordered  Care of patient turned over to Joyce Saupe, NP at 8:10 AM. Consulted with Dr. Ulanda Edison, ok to send patient home.   A: Gastroenteritis  P: Discharge home RX: Zofran Return to MAU as needed, if symptoms worsen.   Hand washing encouraged.   Joyce Hillock Rasch, NP 10/14/2013 4:33 PM   Joyce Solomon, CNM 10/14/2013 8:11 AM

## 2013-10-14 NOTE — MAU Note (Signed)
Woke up vomiting at 4 am. Abdominal discomfort since then that she relates to vomiting. Denies LOF or VB. Positive fetal movement.

## 2013-10-19 ENCOUNTER — Inpatient Hospital Stay (HOSPITAL_COMMUNITY)
Admission: AD | Admit: 2013-10-19 | Discharge: 2013-10-20 | Disposition: A | Discharge status: Home / Self Care | Payer: Medicaid Other | Source: Ambulatory Visit | Attending: Obstetrics and Gynecology | Admitting: Obstetrics and Gynecology

## 2013-10-19 ENCOUNTER — Encounter (HOSPITAL_COMMUNITY): Payer: Self-pay

## 2013-10-19 DIAGNOSIS — W010XXA Fall on same level from slipping, tripping and stumbling without subsequent striking against object, initial encounter: Secondary | ICD-10-CM | POA: Insufficient documentation

## 2013-10-19 DIAGNOSIS — Y92009 Unspecified place in unspecified non-institutional (private) residence as the place of occurrence of the external cause: Secondary | ICD-10-CM | POA: Insufficient documentation

## 2013-10-19 DIAGNOSIS — O99891 Other specified diseases and conditions complicating pregnancy: Secondary | ICD-10-CM | POA: Insufficient documentation

## 2013-10-19 DIAGNOSIS — R109 Unspecified abdominal pain: Secondary | ICD-10-CM | POA: Insufficient documentation

## 2013-10-19 DIAGNOSIS — W1841XA Slipping, tripping and stumbling without falling due to stepping on object, initial encounter: Secondary | ICD-10-CM

## 2013-10-19 NOTE — MAU Note (Signed)
Pt states that at 10pm she slipped on some water in her kitchen and did a split. Did not fall on her abdomen. Denies vag bleeding, LOF or UC. States that she has vaginal pain. States +FM.

## 2013-10-20 ENCOUNTER — Ambulatory Visit (INDEPENDENT_AMBULATORY_CARE_PROVIDER_SITE_OTHER): Payer: Medicaid Other | Admitting: Gastroenterology

## 2013-10-20 ENCOUNTER — Other Ambulatory Visit: Payer: Medicaid Other

## 2013-10-20 ENCOUNTER — Encounter: Payer: Self-pay | Admitting: Gastroenterology

## 2013-10-20 VITALS — BP 98/60 | HR 100 | Ht 59.75 in | Wt 151.5 lb

## 2013-10-20 DIAGNOSIS — W2209XA Striking against other stationary object, initial encounter: Secondary | ICD-10-CM

## 2013-10-20 DIAGNOSIS — K5190 Ulcerative colitis, unspecified, without complications: Secondary | ICD-10-CM

## 2013-10-20 LAB — HEPATITIS B CORE ANTIBODY, TOTAL: Hep B Core Total Ab: NONREACTIVE

## 2013-10-20 LAB — HEPATITIS B SURFACE ANTIGEN: Hepatitis B Surface Ag: NEGATIVE

## 2013-10-20 LAB — HEPATITIS A ANTIBODY, IGM: Hep A IgM: NONREACTIVE

## 2013-10-20 LAB — HEPATITIS B SURFACE ANTIBODY,QUALITATIVE: Hep B S Ab: POSITIVE — AB

## 2013-10-20 LAB — HEPATITIS B CORE ANTIBODY, IGM: Hep B C IgM: NONREACTIVE

## 2013-10-20 MED ORDER — MESALAMINE 1.2 G PO TBEC: 2.4000 g | DELAYED_RELEASE_TABLET | Freq: Every day | ORAL | Status: DC

## 2013-10-20 MED ORDER — CYCLOBENZAPRINE HCL 10 MG PO TABS: 10.0000 mg | ORAL_TABLET | Freq: Once | ORAL | Status: DC

## 2013-10-20 NOTE — Assessment & Plan Note (Addendum)
Patient is symptomatic with constipation and rectal bleeding.  At last sigmoidoscopy several years ago no strictures were seen an active left-sided colitis was noted.  Recommendations #1 restart lialda 2.4 g daily #2 check serologies for hepatitis A., B. and C. and check PPD #3 check LFTs and BMET in approximately 2-3 months  The patient was instructed not to nurse since mesalamine can be excreted into the milk

## 2013-10-20 NOTE — Progress Notes (Signed)
History of Present Illness: A 27 year old Afro-American female, 8 months pregnant, with history of ulcerative colitis extending at least to the hepatic flexure , diagnosed in 2007, here for evaluation of UC.  She's been to to other practices in town and has been treated with steroids and mesalamine in the past.  She's been off medications for over a year.  Her main complaint is abdominal pain and constipation.  She has the urge to defecate but only passes a solid stool with straining.  With a bowel movement she has lower abdominal pain.  She's also having rectal bleeding.  This is typical of her UC symptoms.    Past Medical History  Diagnosis Date  . Anemia   . Chronic ulcerative colitis   . Headache(784.0)   . Blood transfusion without reported diagnosis     "year ago" b/c of anemia & UC   Past Surgical History  Procedure Laterality Date  . Colon biopsy    . Tonsillectomy    . Dilation and evacuation  08/21/2012    Procedure: DILATATION AND EVACUATION;  Surgeon: Logan Bores, MD;  Location: [facility];  Service: Gynecology;  Laterality: N/A;   family history is negative for Anesthesia problems, Hypotension, Malignant hyperthermia, Pseudochol deficiency, Other, Alcohol abuse, Arthritis, Asthma, Birth defects, Cancer, COPD, Depression, Diabetes, Drug abuse, Early death, Hearing loss, Heart disease, Hyperlipidemia, Hypertension, Kidney disease, Learning disabilities, Mental illness, Mental retardation, Miscarriages / Stillbirths, Stroke, and Vision loss. Current Outpatient Prescriptions  Medication Sig Dispense Refill  . acetaminophen (TYLENOL) 325 MG tablet Take 650 mg by mouth every 6 (six) hours as needed.      . cyclobenzaprine (FLEXERIL) 10 MG tablet Take 1 tablet (10 mg total) by mouth once.  10 tablet  0  . Prenatal Vit-Fe Fumarate-FA (PRENATAL MULTIVITAMIN) TABS tablet Take 1 tablet by mouth every evening.       No current facility-administered medications for this visit.    Allergies as of 10/20/2013 - Review Complete 10/20/2013  Allergen Reaction Noted  . Latex Rash 05/20/2009    reports that she quit smoking about 3 years ago. Her smoking use included Cigarettes. She smoked 0.00 packs per day for 3 years. She has never used smokeless tobacco. She reports that she does not drink alcohol or use illicit drugs.     Review of Systems: Pertinent positive and negative review of systems were noted in the above HPI section. All other review of systems were otherwise negative.  Vital signs were reviewed in today's medical record Physical Exam: General: Well developed , well nourished, no acute distress Skin: anicteric Head: Normocephalic and atraumatic Eyes:  sclerae anicteric, EOMI Ears: Normal auditory acuity Mouth: No deformity or lesions Neck: Supple, no masses or thyromegaly Lungs: Clear throughout to auscultation Heart: Regular rate and rhythm; no murmurs, rubs or bruits Abdomen: Soft, non tender .  She has a gravid abdomen reflecting an eight-month pregnancy Rectal:deferred Musculoskeletal: Symmetrical with no gross deformities  Skin: No lesions on visible extremities Pulses:  Normal pulses noted Extremities: No clubbing, cyanosis, edema or deformities noted Neurological: Alert oriented x 4, grossly nonfocal Cervical Nodes:  No significant cervical adenopathy Inguinal Nodes: No significant inguinal adenopathy Psychological:  Alert and cooperative. Normal mood and affect

## 2013-10-20 NOTE — MAU Provider Note (Signed)
History     CSN: [id number]  Arrival date and time: 10/19/13 2326   First Provider Initiated Contact with Patient 10/20/13 0007      Chief Complaint  Patient presents with  . Fall   Fall    Joyce Solomon is a 27 y.o. G3P1011 at 2w5dwho presents today after slipping on some water in her kitchen. She states that she did not technically fall, but she went into a deep split. She did not hit her abdomen or her back. She states that she took some tylenol because she developed some groin pain after the fall, but it has not helped. She denies any bleeding or LOF.   Past Medical History  Diagnosis Date  . Anemia   . Chronic ulcerative colitis   . Blood transfusion   . Ulcerative colitis   . Headache(784.0)   . Blood transfusion without reported diagnosis     "year ago" b/c of anemia & UC    Past Surgical History  Procedure Laterality Date  . Colon biopsy    . Tonsillectomy    . Dilation and evacuation  08/21/2012    Procedure: DILATATION AND EVACUATION;  Surgeon: KLogan Bores MD;  [facility];  Service: Gynecology;  Laterality: N/A;    Family History  Problem Relation Age of Onset  . Anesthesia problems Neg Hx   . Hypotension Neg Hx   . Malignant hyperthermia Neg Hx   . Pseudochol deficiency Neg Hx   . Other Neg Hx   . Alcohol abuse Neg Hx   . Arthritis Neg Hx   . Asthma Neg Hx   . Birth defects Neg Hx   . Cancer Neg Hx   . COPD Neg Hx   . Depression Neg Hx   . Diabetes Neg Hx   . Drug abuse Neg Hx   . Early death Neg Hx   . Hearing loss Neg Hx   . Heart disease Neg Hx   . Hyperlipidemia Neg Hx   . Hypertension Neg Hx   . Kidney disease Neg Hx   . Learning disabilities Neg Hx   . Mental illness Neg Hx   . Mental retardation Neg Hx   . Miscarriages / Stillbirths Neg Hx   . Stroke Neg Hx   . Vision loss Neg Hx     History  Substance Use Topics  . Smoking status: Former Smoker -- 3 years    Types: Cigarettes    Quit date: 05/16/2010  .  Smokeless tobacco: Never Used  . Alcohol Use: No    Allergies:  Allergies  Allergen Reactions  . Latex Rash    gloves    Prescriptions prior to admission  Medication Sig Dispense Refill  . acetaminophen (TYLENOL) 325 MG tablet Take 650 mg by mouth every 6 (six) hours as needed.      . ondansetron (ZOFRAN ODT) 4 MG disintegrating tablet Take 1 tablet (4 mg total) by mouth every 8 (eight) hours as needed for nausea or vomiting.  20 tablet  0  . Prenatal Vit-Fe Fumarate-FA (PRENATAL MULTIVITAMIN) TABS tablet Take 1 tablet by mouth every evening.        ROS Physical Exam   Blood pressure 121/78, pulse 87, temperature 98.4 F (36.9 C), temperature source Oral, resp. rate 18, height 4' 11"  (1.499 m), weight 68.493 kg (151 lb), last menstrual period 03/12/2013, SpO2 100.00%.  Physical Exam  Nursing note and vitals reviewed. Constitutional: She is oriented to person, place, and  time. She appears well-developed and well-nourished. No distress.  Cardiovascular: Normal rate.   Respiratory: Effort normal.  GI: Soft. There is no tenderness.  Genitourinary:   Cervix: closed/thick/high   Neurological: She is alert and oriented to person, place, and time.  Skin: Skin is warm and dry.  Psychiatric: She has a normal mood and affect.    MAU Course  Procedures  0015: C/W Dr. Marvel Plan: ok for dc home, may have RX for flexeril.   Assessment and Plan   1. Slip/trip w/o falling due to stepping on object, init    Follow-up Information   Follow up with Logan Bores, MD. (as scheduled )    Specialty:  Obstetrics and Gynecology   Contact information:   [address], [address] [place] [address] [phone]        Medication List         acetaminophen 325 MG tablet  Commonly known as:  TYLENOL  Take 650 mg by mouth every 6 (six) hours as needed.     cyclobenzaprine 10 MG tablet  Commonly known as:  FLEXERIL  Take 1 tablet (10 mg total) by mouth once.      ondansetron 4 MG disintegrating tablet  Commonly known as:  ZOFRAN ODT  Take 1 tablet (4 mg total) by mouth every 8 (eight) hours as needed for nausea or vomiting.     prenatal multivitamin Tabs tablet  Take 1 tablet by mouth every evening.       Return to MAU as needed Fetal kick counts   Joyce Solomon 10/20/2013, 12:11 AM

## 2013-10-20 NOTE — Patient Instructions (Signed)
Go to the basement for labs today You are receiving a PPD today you will need to return in 48 hours to have it read

## 2013-10-22 ENCOUNTER — Other Ambulatory Visit: Payer: Self-pay

## 2013-10-22 DIAGNOSIS — K5190 Ulcerative colitis, unspecified, without complications: Secondary | ICD-10-CM

## 2013-11-17 ENCOUNTER — Observation Stay: Payer: Self-pay | Admitting: Obstetrics and Gynecology

## 2013-11-17 LAB — BASIC METABOLIC PANEL
Anion Gap: 11 (ref 7–16)
BUN: 5 mg/dL — ABNORMAL LOW (ref 7–18)
Calcium, Total: 8.9 mg/dL (ref 8.5–10.1)
Chloride: 107 mmol/L (ref 98–107)
Co2: 19 mmol/L — ABNORMAL LOW (ref 21–32)
Creatinine: 0.39 mg/dL — ABNORMAL LOW (ref 0.60–1.30)
Glucose: 90 mg/dL (ref 65–99)
Osmolality: 271 (ref 275–301)
Potassium: 3.7 mmol/L (ref 3.5–5.1)

## 2013-11-17 LAB — URINALYSIS, COMPLETE
Bilirubin,UR: NEGATIVE
Hyaline Cast: 2
Ph: 5 (ref 4.5–8.0)
Protein: 30
RBC,UR: 3 /HPF (ref 0–5)

## 2013-11-17 LAB — CBC WITH DIFFERENTIAL/PLATELET
Basophil #: 0 10*3/uL (ref 0.0–0.1)
Basophil %: 0.1 %
Eosinophil %: 0.3 %
HGB: 13.6 g/dL (ref 12.0–16.0)
Lymphocyte #: 0.5 10*3/uL — ABNORMAL LOW (ref 1.0–3.6)
MCH: 29.9 pg (ref 26.0–34.0)
MCHC: 33.5 g/dL (ref 32.0–36.0)
Neutrophil #: 7.8 10*3/uL — ABNORMAL HIGH (ref 1.4–6.5)
Neutrophil %: 87.3 %
Platelet: 199 10*3/uL (ref 150–440)
RBC: 4.55 10*6/uL (ref 3.80–5.20)

## 2013-11-19 LAB — OB RESULTS CONSOLE GBS: GBS: NEGATIVE

## 2013-11-20 ENCOUNTER — Encounter (HOSPITAL_COMMUNITY): Payer: Self-pay | Admitting: *Deleted

## 2013-11-20 ENCOUNTER — Inpatient Hospital Stay (HOSPITAL_COMMUNITY)
Admission: AD | Admit: 2013-11-20 | Discharge: 2013-11-20 | Disposition: A | Discharge status: Home / Self Care | Payer: Medicaid Other | Source: Ambulatory Visit | Attending: Obstetrics and Gynecology | Admitting: Obstetrics and Gynecology

## 2013-11-20 DIAGNOSIS — O4700 False labor before 37 completed weeks of gestation, unspecified trimester: Secondary | ICD-10-CM | POA: Insufficient documentation

## 2013-11-20 DIAGNOSIS — R51 Headache: Secondary | ICD-10-CM | POA: Insufficient documentation

## 2013-11-20 MED ORDER — ACETAMINOPHEN 500 MG PO TABS
1000.0000 mg | ORAL_TABLET | Freq: Once | ORAL | Status: AC
  Administered 2013-11-20: 1000 mg via ORAL
  Filled 2013-11-20: qty 2

## 2013-11-20 NOTE — MAU Note (Signed)
Patient states she has had a headache for the past hour

## 2013-11-20 NOTE — MAU Note (Signed)
Pt states she  Started feeling pain this morning.

## 2013-11-20 NOTE — MAU Note (Signed)
Patient states she is having contractions every 5 minutes. Denies bleeding or leaking and reports good fetal movement.

## 2013-11-20 NOTE — Discharge Instructions (Signed)
Third Trimester of Pregnancy The third trimester is from week 29 through week 42, months 7 through 9. The third trimester is a time when the fetus is growing rapidly. At the end of the ninth month, the fetus is about 20 inches in length and weighs 6 10 pounds.  BODY CHANGES Your body goes through many changes during pregnancy. The changes vary from woman to woman.   Your weight will continue to increase. You can expect to gain 25 35 pounds (11 16 kg) by the end of the pregnancy.  You may begin to get stretch marks on your hips, abdomen, and breasts.  You may urinate more often because the fetus is moving lower into your pelvis and pressing on your bladder.  You may develop or continue to have heartburn as a result of your pregnancy.  You may develop constipation because certain hormones are causing the muscles that push waste through your intestines to slow down.  You may develop hemorrhoids or swollen, bulging veins (varicose veins).  You may have pelvic pain because of the weight gain and pregnancy hormones relaxing your joints between the bones in your pelvis. Back aches may result from over exertion of the muscles supporting your posture.  Your breasts will continue to grow and be tender. A yellow discharge may leak from your breasts called colostrum.  Your belly button may stick out.  You may feel short of breath because of your expanding uterus.  You may notice the fetus "dropping," or moving lower in your abdomen.  You may have a bloody mucus discharge. This usually occurs a few days to a week before labor begins.  Your cervix becomes thin and soft (effaced) near your due date. WHAT TO EXPECT AT YOUR PRENATAL EXAMS  You will have prenatal exams every 2 weeks until week 36. Then, you will have weekly prenatal exams. During a routine prenatal visit:  You will be weighed to make sure you and the fetus are growing normally.  Your blood pressure is taken.  Your abdomen will be  measured to track your baby's growth.  The fetal heartbeat will be listened to.  Any test results from the previous visit will be discussed.  You may have a cervical check near your due date to see if you have effaced. At around 36 weeks, your caregiver will check your cervix. At the same time, your caregiver will also perform a test on the secretions of the vaginal tissue. This test is to determine if a type of bacteria, Group B streptococcus, is present. Your caregiver will explain this further. Your caregiver may ask you:  What your birth plan is.  How you are feeling.  If you are feeling the baby move.  If you have had any abnormal symptoms, such as leaking fluid, bleeding, severe headaches, or abdominal cramping.  If you have any questions. Other tests or screenings that may be performed during your third trimester include:  Blood tests that check for low iron levels (anemia).  Fetal testing to check the health, activity level, and growth of the fetus. Testing is done if you have certain medical conditions or if there are problems during the pregnancy. FALSE LABOR You may feel small, irregular contractions that eventually go away. These are called Braxton Hicks contractions, or false labor. Contractions may last for hours, days, or even weeks before true labor sets in. If contractions come at regular intervals, intensify, or become painful, it is best to be seen by your caregiver.  SIGNS OF LABOR   Menstrual-like cramps.  Contractions that are 5 minutes apart or less.  Contractions that start on the top of the uterus and spread down to the lower abdomen and back.  A sense of increased pelvic pressure or back pain.  A watery or bloody mucus discharge that comes from the vagina. If you have any of these signs before the 37th week of pregnancy, call your caregiver right away. You need to go to the hospital to get checked immediately. HOME CARE INSTRUCTIONS   Avoid all  smoking, herbs, alcohol, and unprescribed drugs. These chemicals affect the formation and growth of the baby.  Follow your caregiver's instructions regarding medicine use. There are medicines that are either safe or unsafe to take during pregnancy.  Exercise only as directed by your caregiver. Experiencing uterine cramps is a good sign to stop exercising.  Continue to eat regular, healthy meals.  Wear a good support bra for breast tenderness.  Do not use hot tubs, steam rooms, or saunas.  Wear your seat belt at all times when driving.  Avoid raw meat, uncooked cheese, cat litter boxes, and soil used by cats. These carry germs that can cause birth defects in the baby.  Take your prenatal vitamins.  Try taking a stool softener (if your caregiver approves) if you develop constipation. Eat more high-fiber foods, such as fresh vegetables or fruit and whole grains. Drink plenty of fluids to keep your urine clear or pale yellow.  Take warm sitz baths to soothe any pain or discomfort caused by hemorrhoids. Use hemorrhoid cream if your caregiver approves.  If you develop varicose veins, wear support hose. Elevate your feet for 15 minutes, 3 4 times a day. Limit salt in your diet.  Avoid heavy lifting, wear low heal shoes, and practice good posture.  Rest a lot with your legs elevated if you have leg cramps or low back pain.  Visit your dentist if you have not gone during your pregnancy. Use a soft toothbrush to brush your teeth and be gentle when you floss.  A sexual relationship may be continued unless your caregiver directs you otherwise.  Do not travel far distances unless it is absolutely necessary and only with the approval of your caregiver.  Take prenatal classes to understand, practice, and ask questions about the labor and delivery.  Make a trial run to the hospital.  Pack your hospital bag.  Prepare the baby's nursery.  Continue to go to all your prenatal visits as directed  by your caregiver. SEEK MEDICAL CARE IF:  You are unsure if you are in labor or if your water has broken.  You have dizziness.  You have mild pelvic cramps, pelvic pressure, or nagging pain in your abdominal area.  You have persistent nausea, vomiting, or diarrhea.  You have a bad smelling vaginal discharge.  You have pain with urination. SEEK IMMEDIATE MEDICAL CARE IF:   You have a fever.  You are leaking fluid from your vagina.  You have spotting or bleeding from your vagina.  You have severe abdominal cramping or pain.  You have rapid weight loss or gain.  You have shortness of breath with chest pain.  You notice sudden or extreme swelling of your face, hands, ankles, feet, or legs.  You have not felt your baby move in over an hour.  You have severe headaches that do not go away with medicine.  You have vision changes. Document Released: 10/31/2001 Document Revised: 07/09/2013 Document Reviewed:  01/07/2013 ExitCare Patient Information 2014 [employer]. Fetal Movement Counts Patient Name: __________________________________________________ Patient Due Date: ____________________ Performing a fetal movement count is highly recommended in high-risk pregnancies, but it is good for every pregnant woman to do. Your caregiver may ask you to start counting fetal movements at 28 weeks of the pregnancy. Fetal movements often increase:  After eating a full meal.  After physical activity.  After eating or drinking something sweet or cold.  At rest. Pay attention to when you feel the baby is most active. This will help you notice a pattern of your baby's sleep and wake cycles and what factors contribute to an increase in fetal movement. It is important to perform a fetal movement count at the same time each day when your baby is normally most active.  HOW TO COUNT FETAL MOVEMENTS 1. Find a quiet and comfortable area to sit or lie down on your left side. Lying on your left  side provides the best blood and oxygen circulation to your baby. 2. Write down the day and time on a sheet of paper or in a journal. 3. Start counting kicks, flutters, swishes, rolls, or jabs in a 2 hour period. You should feel at least 10 movements within 2 hours. 4. If you do not feel 10 movements in 2 hours, wait 2 3 hours and count again. Look for a change in the pattern or not enough counts in 2 hours. SEEK MEDICAL CARE IF:  You feel less than 10 counts in 2 hours, tried twice.  There is no movement in over an hour.  The pattern is changing or taking longer each day to reach 10 counts in 2 hours.  You feel the baby is not moving as he or she usually does. Date: ____________ Movements: ____________ Start time: ____________ Elizebeth Koller time: ____________  Date: ____________ Movements: ____________ Start time: ____________ Elizebeth Koller time: ____________ Date: ____________ Movements: ____________ Start time: ____________ Elizebeth Koller time: ____________ Date: ____________ Movements: ____________ Start time: ____________ Elizebeth Koller time: ____________ Date: ____________ Movements: ____________ Start time: ____________ Elizebeth Koller time: ____________ Date: ____________ Movements: ____________ Start time: ____________ Elizebeth Koller time: ____________ Date: ____________ Movements: ____________ Start time: ____________ Elizebeth Koller time: ____________ Date: ____________ Movements: ____________ Start time: ____________ Elizebeth Koller time: ____________  Date: ____________ Movements: ____________ Start time: ____________ Elizebeth Koller time: ____________ Date: ____________ Movements: ____________ Start time: ____________ Elizebeth Koller time: ____________ Date: ____________ Movements: ____________ Start time: ____________ Elizebeth Koller time: ____________ Date: ____________ Movements: ____________ Start time: ____________ Elizebeth Koller time: ____________ Date: ____________ Movements: ____________ Start time: ____________ Elizebeth Koller time: ____________ Date: ____________ Movements:  ____________ Start time: ____________ Elizebeth Koller time: ____________ Date: ____________ Movements: ____________ Start time: ____________ Elizebeth Koller time: ____________  Date: ____________ Movements: ____________ Start time: ____________ Elizebeth Koller time: ____________ Date: ____________ Movements: ____________ Start time: ____________ Elizebeth Koller time: ____________ Date: ____________ Movements: ____________ Start time: ____________ Elizebeth Koller time: ____________ Date: ____________ Movements: ____________ Start time: ____________ Elizebeth Koller time: ____________ Date: ____________ Movements: ____________ Start time: ____________ Elizebeth Koller time: ____________ Date: ____________ Movements: ____________ Start time: ____________ Elizebeth Koller time: ____________ Date: ____________ Movements: ____________ Start time: ____________ Elizebeth Koller time: ____________  Date: ____________ Movements: ____________ Start time: ____________ Elizebeth Koller time: ____________ Date: ____________ Movements: ____________ Start time: ____________ Elizebeth Koller time: ____________ Date: ____________ Movements: ____________ Start time: ____________ Elizebeth Koller time: ____________ Date: ____________ Movements: ____________ Start time: ____________ Elizebeth Koller time: ____________ Date: ____________ Movements: ____________ Start time: ____________ Elizebeth Koller time: ____________ Date: ____________ Movements: ____________ Start time: ____________ Elizebeth Koller time: ____________ Date: ____________ Movements: ____________ Start time: ____________ Elizebeth Koller time: ____________  Date:  ____________ Movements: ____________ Start time: ____________ Elizebeth Koller time: ____________ Date: ____________ Movements: ____________ Start time: ____________ Elizebeth Koller time: ____________ Date: ____________ Movements: ____________ Start time: ____________ Elizebeth Koller time: ____________ Date: ____________ Movements: ____________ Start time: ____________ Elizebeth Koller time: ____________ Date: ____________ Movements: ____________ Start time: ____________ Elizebeth Koller  time: ____________ Date: ____________ Movements: ____________ Start time: ____________ Elizebeth Koller time: ____________ Date: ____________ Movements: ____________ Start time: ____________ Elizebeth Koller time: ____________  Date: ____________ Movements: ____________ Start time: ____________ Elizebeth Koller time: ____________ Date: ____________ Movements: ____________ Start time: ____________ Elizebeth Koller time: ____________ Date: ____________ Movements: ____________ Start time: ____________ Elizebeth Koller time: ____________ Date: ____________ Movements: ____________ Start time: ____________ Elizebeth Koller time: ____________ Date: ____________ Movements: ____________ Start time: ____________ Elizebeth Koller time: ____________ Date: ____________ Movements: ____________ Start time: ____________ Elizebeth Koller time: ____________ Date: ____________ Movements: ____________ Start time: ____________ Elizebeth Koller time: ____________  Date: ____________ Movements: ____________ Start time: ____________ Elizebeth Koller time: ____________ Date: ____________ Movements: ____________ Start time: ____________ Elizebeth Koller time: ____________ Date: ____________ Movements: ____________ Start time: ____________ Elizebeth Koller time: ____________ Date: ____________ Movements: ____________ Start time: ____________ Elizebeth Koller time: ____________ Date: ____________ Movements: ____________ Start time: ____________ Elizebeth Koller time: ____________ Date: ____________ Movements: ____________ Start time: ____________ Elizebeth Koller time: ____________ Date: ____________ Movements: ____________ Start time: ____________ Elizebeth Koller time: ____________  Date: ____________ Movements: ____________ Start time: ____________ Elizebeth Koller time: ____________ Date: ____________ Movements: ____________ Start time: ____________ Elizebeth Koller time: ____________ Date: ____________ Movements: ____________ Start time: ____________ Elizebeth Koller time: ____________ Date: ____________ Movements: ____________ Start time: ____________ Elizebeth Koller time: ____________ Date: ____________  Movements: ____________ Start time: ____________ Elizebeth Koller time: ____________ Date: ____________ Movements: ____________ Start time: ____________ Elizebeth Koller time: ____________ Document Released: 12/06/2006 Document Revised: 10/23/2012 Document Reviewed: 09/02/2012 ExitCare Patient Information 2014 [employer]

## 2013-11-20 NOTE — Progress Notes (Signed)
Pt states has had a headache for an hour

## 2013-12-05 ENCOUNTER — Telehealth (HOSPITAL_COMMUNITY): Payer: Self-pay | Admitting: *Deleted

## 2013-12-05 ENCOUNTER — Encounter (HOSPITAL_COMMUNITY): Payer: Self-pay | Admitting: *Deleted

## 2013-12-05 NOTE — Telephone Encounter (Signed)
Preadmission screen  

## 2013-12-08 NOTE — H&P (Signed)
Joyce Solomon is a 28 y.o. female G3P1011 at 27 6/7 weeks (EDD 12/17/13 by 7 week [place])  for IOL given poor growth of fetus and suspected IUGR.  Pt had a prior IUGR delivery and was followed by growth [place] this pregnancy.  Although fetal growth stayed >10%ile, there was a consistent AC lag of <10%ile and on her last [place] at 37 weeks there was alos a lag of the long bones at <5%ile.  The patient was discussed with Dr. Lisbeth Renshaw and he agreed she should be induced anytime after 38 weeks.  Her dopplers have been normal but BPP 6/8 with reactive NST's.  The patient has a history of ulcerative colitis, stable on pentasa.  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   Maternal Medical History:  Contractions: Frequency: irregular.   Perceived severity is mild.    Fetal activity: Perceived fetal activity is normal.    Prenatal complications: IUGR.   Prenatal Complications - Diabetes: none.    OB History   Grav Para Term Preterm Abortions TAB SAB Ect Mult Living   3 1 1  1  1   1     NSVD 2012 5#7oz SAB 2013  Past Medical History  Diagnosis Date  . Anemia   . Chronic ulcerative colitis   . Headache(784.0)   . Blood transfusion without reported diagnosis     "year ago" b/c of anemia & UC   Past Surgical History  Procedure Laterality Date  . Colon biopsy    . Tonsillectomy    . Dilation and evacuation  08/21/2012    Procedure: DILATATION AND EVACUATION;  Surgeon: Logan Bores, MD;  Location: [facility];  Service: Gynecology;  Laterality: N/A;   Family History: family history is negative for Anesthesia problems, Hypotension, Malignant hyperthermia, Pseudochol deficiency, Other, Alcohol abuse, Arthritis, Asthma, Birth defects, Cancer, COPD, Depression, Diabetes, Drug abuse, Early death, Hearing loss, Heart disease, Hyperlipidemia, Hypertension, Kidney disease, Learning disabilities, Mental illness, Mental retardation, Miscarriages / Stillbirths, Stroke, and Vision loss. Social History:  reports that she quit smoking  about 3 years ago. Her smoking use included Cigarettes. She smoked 0.00 packs per day for 3 years. She has never used smokeless tobacco. She reports that she does not drink alcohol or use illicit drugs.   Prenatal Transfer Tool  Maternal Diabetes: No Genetic Screening: Normal Maternal Ultrasounds/Referrals: Abnormal:  Findings:   IUGR Fetal Ultrasounds or other Referrals:  None Maternal Substance Abuse:  No Significant Maternal Medications:  None Significant Maternal Lab Results:  Lab values include: Other: CF Carrier Other Comments:  Mother with ucerative colitis  ROS    Last menstrual period 03/12/2013. Maternal Exam:  Uterine Assessment: Contraction strength is mild.  Contraction frequency is irregular.   Abdomen: Fetal presentation: vertex  Introitus: Normal vulva. Normal vagina.    Physical Exam  Constitutional: She is oriented to person, place, and time. She appears well-developed and well-nourished.  Cardiovascular: Normal rate and regular rhythm.   Respiratory: Effort normal.  GI: Soft.  Genitourinary: Vagina normal and uterus normal.  Neurological: She is alert and oriented to person, place, and time.    Prenatal labs: ABO, Rh: B/Positive/-- (08/14 0000) Antibody: Negative (08/14 0000) Rubella:  Immune RPR: Nonreactive (08/14 0000)  HBsAg: NEGATIVE (12/01 1008)  HIV: Non-reactive (08/14 0000)  GBS: Negative (12/31 0000)  Hgb AA CF Carrier One hour GTT 110 Quad screen negative  Assessment/Plan: Pt for IOL for poor growth with EFW 14%ile, long bones <5%ile and AC=10%ile.  Pt with prior  growth restricted fetus 5#7oz at birth.  MFM agrees with IOL.  Plan AROM and pitocin.   Logan Bores 12/08/2013, 8:37 PM

## 2013-12-09 ENCOUNTER — Inpatient Hospital Stay (HOSPITAL_COMMUNITY)
Admission: RE | Admit: 2013-12-09 | Discharge: 2013-12-12 | DRG: 765 | Disposition: A | Discharge status: Home / Self Care | Payer: Medicaid Other | Source: Ambulatory Visit | Attending: Obstetrics and Gynecology | Admitting: Obstetrics and Gynecology

## 2013-12-09 ENCOUNTER — Inpatient Hospital Stay (HOSPITAL_COMMUNITY): Payer: Medicaid Other | Admitting: Anesthesiology

## 2013-12-09 ENCOUNTER — Encounter (HOSPITAL_COMMUNITY)
Admission: RE | Disposition: A | Discharge status: Home / Self Care | Payer: Self-pay | Source: Ambulatory Visit | Attending: Obstetrics and Gynecology

## 2013-12-09 ENCOUNTER — Encounter (HOSPITAL_COMMUNITY): Payer: Self-pay

## 2013-12-09 ENCOUNTER — Encounter (HOSPITAL_COMMUNITY): Payer: Medicaid Other | Admitting: Anesthesiology

## 2013-12-09 VITALS — BP 109/70 | HR 76 | Temp 97.8°F | Resp 16 | Ht 59.0 in | Wt 152.0 lb

## 2013-12-09 DIAGNOSIS — Z98891 History of uterine scar from previous surgery: Secondary | ICD-10-CM

## 2013-12-09 DIAGNOSIS — O9989 Other specified diseases and conditions complicating pregnancy, childbirth and the puerperium: Secondary | ICD-10-CM

## 2013-12-09 DIAGNOSIS — O4590 Premature separation of placenta, unspecified, unspecified trimester: Principal | ICD-10-CM | POA: Diagnosis present

## 2013-12-09 DIAGNOSIS — IMO0002 Reserved for concepts with insufficient information to code with codable children: Secondary | ICD-10-CM

## 2013-12-09 DIAGNOSIS — O09299 Supervision of pregnancy with other poor reproductive or obstetric history, unspecified trimester: Secondary | ICD-10-CM | POA: Diagnosis present

## 2013-12-09 DIAGNOSIS — O99892 Other specified diseases and conditions complicating childbirth: Secondary | ICD-10-CM | POA: Diagnosis present

## 2013-12-09 DIAGNOSIS — K5190 Ulcerative colitis, unspecified, without complications: Secondary | ICD-10-CM | POA: Diagnosis present

## 2013-12-09 DIAGNOSIS — O365990 Maternal care for other known or suspected poor fetal growth, unspecified trimester, not applicable or unspecified: Secondary | ICD-10-CM | POA: Diagnosis present

## 2013-12-09 DIAGNOSIS — Z141 Cystic fibrosis carrier: Secondary | ICD-10-CM

## 2013-12-09 DIAGNOSIS — O34219 Maternal care for unspecified type scar from previous cesarean delivery: Secondary | ICD-10-CM

## 2013-12-09 LAB — ABO/RH: ABO/RH(D): B POS

## 2013-12-09 LAB — CBC
HEMATOCRIT: 34.8 % — AB (ref 36.0–46.0)
Hemoglobin: 11.6 g/dL — ABNORMAL LOW (ref 12.0–15.0)
MCH: 28.5 pg (ref 26.0–34.0)
MCHC: 33.3 g/dL (ref 30.0–36.0)
MCV: 85.5 fL (ref 78.0–100.0)
PLATELETS: 237 10*3/uL (ref 150–400)
RBC: 4.07 MIL/uL (ref 3.87–5.11)
RDW: 14.2 % (ref 11.5–15.5)
WBC: 7.6 10*3/uL (ref 4.0–10.5)

## 2013-12-09 LAB — TYPE AND SCREEN
ABO/RH(D): B POS
ANTIBODY SCREEN: NEGATIVE

## 2013-12-09 LAB — RPR: RPR Ser Ql: NONREACTIVE

## 2013-12-09 SURGERY — Surgical Case
Anesthesia: Epidural | Site: Abdomen

## 2013-12-09 MED ORDER — LACTATED RINGERS IV SOLN
500.0000 mL | INTRAVENOUS | Status: DC | PRN
  Administered 2013-12-09 (×2): 500 mL via INTRAVENOUS

## 2013-12-09 MED ORDER — TERBUTALINE SULFATE 1 MG/ML IJ SOLN: 0.2500 mg | Freq: Once | INTRAMUSCULAR | Status: DC | PRN

## 2013-12-09 MED ORDER — PHENYLEPHRINE 40 MCG/ML (10ML) SYRINGE FOR IV PUSH (FOR BLOOD PRESSURE SUPPORT)
PREFILLED_SYRINGE | INTRAVENOUS | Status: AC
  Filled 2013-12-09: qty 5

## 2013-12-09 MED ORDER — MORPHINE SULFATE (PF) 0.5 MG/ML IJ SOLN
INTRAMUSCULAR | Status: DC | PRN
  Administered 2013-12-09: 1 mg via INTRAVENOUS

## 2013-12-09 MED ORDER — LIDOCAINE-EPINEPHRINE (PF) 2 %-1:200000 IJ SOLN
INTRAMUSCULAR | Status: AC
  Filled 2013-12-09: qty 20

## 2013-12-09 MED ORDER — SODIUM CHLORIDE 0.9 % IV SOLN
12.0000 mL/h | INTRAVENOUS | Status: DC
  Administered 2013-12-09: 12 mL/h via EPIDURAL
  Filled 2013-12-09 (×2): qty 17

## 2013-12-09 MED ORDER — FENTANYL CITRATE 0.05 MG/ML IJ SOLN: 25.0000 ug | INTRAMUSCULAR | Status: DC | PRN

## 2013-12-09 MED ORDER — MORPHINE SULFATE (PF) 0.5 MG/ML IJ SOLN
INTRAMUSCULAR | Status: DC | PRN
  Administered 2013-12-09: 4 mg via EPIDURAL

## 2013-12-09 MED ORDER — OXYTOCIN 10 UNIT/ML IJ SOLN
INTRAMUSCULAR | Status: AC
  Filled 2013-12-09: qty 4

## 2013-12-09 MED ORDER — ACETAMINOPHEN 325 MG PO TABS: 650.0000 mg | ORAL_TABLET | ORAL | Status: DC | PRN

## 2013-12-09 MED ORDER — EPHEDRINE 5 MG/ML INJ: 10.0000 mg | INTRAVENOUS | Status: DC | PRN

## 2013-12-09 MED ORDER — CEFAZOLIN SODIUM-DEXTROSE 2-3 GM-% IV SOLR
INTRAVENOUS | Status: AC
  Filled 2013-12-09: qty 50

## 2013-12-09 MED ORDER — EPHEDRINE 5 MG/ML INJ
INTRAVENOUS | Status: AC
  Filled 2013-12-09: qty 4

## 2013-12-09 MED ORDER — LACTATED RINGERS IV SOLN
INTRAVENOUS | Status: DC | PRN
  Administered 2013-12-09 (×2): via INTRAVENOUS

## 2013-12-09 MED ORDER — SCOPOLAMINE 1 MG/3DAYS TD PT72
1.0000 | MEDICATED_PATCH | Freq: Once | TRANSDERMAL | Status: DC
  Administered 2013-12-09: 1.5 mg via TRANSDERMAL

## 2013-12-09 MED ORDER — SODIUM BICARBONATE 8.4 % IV SOLN
INTRAVENOUS | Status: DC | PRN
  Administered 2013-12-09: 4 mL via EPIDURAL
  Administered 2013-12-09 (×3): 5 mL via EPIDURAL

## 2013-12-09 MED ORDER — FENTANYL 2.5 MCG/ML BUPIVACAINE 1/10 % EPIDURAL INFUSION (WH - ANES)
INTRAMUSCULAR | Status: AC
  Filled 2013-12-09: qty 125

## 2013-12-09 MED ORDER — PHENYLEPHRINE 40 MCG/ML (10ML) SYRINGE FOR IV PUSH (FOR BLOOD PRESSURE SUPPORT)
PREFILLED_SYRINGE | INTRAVENOUS | Status: AC
Start: 2013-12-09 — End: 2013-12-09
  Filled 2013-12-09: qty 10

## 2013-12-09 MED ORDER — DIPHENHYDRAMINE HCL 50 MG/ML IJ SOLN: 25.0000 mg | INTRAMUSCULAR | Status: DC | PRN

## 2013-12-09 MED ORDER — LACTATED RINGERS IV SOLN
INTRAVENOUS | Status: DC
  Administered 2013-12-09: 125 mL/h via INTRAVENOUS
  Administered 2013-12-09 (×2): via INTRAVENOUS

## 2013-12-09 MED ORDER — DIPHENHYDRAMINE HCL 50 MG/ML IJ SOLN
12.5000 mg | INTRAMUSCULAR | Status: DC | PRN
  Administered 2013-12-09: 12.5 mg via INTRAVENOUS

## 2013-12-09 MED ORDER — KETOROLAC TROMETHAMINE 60 MG/2ML IM SOLN
INTRAMUSCULAR | Status: AC
  Filled 2013-12-09: qty 2

## 2013-12-09 MED ORDER — OXYCODONE-ACETAMINOPHEN 5-325 MG PO TABS: 1.0000 | ORAL_TABLET | ORAL | Status: DC | PRN

## 2013-12-09 MED ORDER — DIPHENHYDRAMINE HCL 50 MG/ML IJ SOLN
INTRAMUSCULAR | Status: AC
  Filled 2013-12-09: qty 1

## 2013-12-09 MED ORDER — LACTATED RINGERS IV SOLN: INTRAVENOUS | Status: DC

## 2013-12-09 MED ORDER — FENTANYL CITRATE 0.05 MG/ML IJ SOLN
INTRAMUSCULAR | Status: AC
  Filled 2013-12-09: qty 2

## 2013-12-09 MED ORDER — PHENYLEPHRINE 40 MCG/ML (10ML) SYRINGE FOR IV PUSH (FOR BLOOD PRESSURE SUPPORT): 80.0000 ug | PREFILLED_SYRINGE | INTRAVENOUS | Status: DC | PRN

## 2013-12-09 MED ORDER — OXYTOCIN 40 UNITS IN LACTATED RINGERS INFUSION - SIMPLE MED
1.0000 m[IU]/min | INTRAVENOUS | Status: DC
  Administered 2013-12-09: 8 m[IU]/min via INTRAVENOUS
  Administered 2013-12-09: 2 m[IU]/min via INTRAVENOUS
  Filled 2013-12-09: qty 1000

## 2013-12-09 MED ORDER — SCOPOLAMINE 1 MG/3DAYS TD PT72
MEDICATED_PATCH | TRANSDERMAL | Status: AC
  Filled 2013-12-09: qty 1

## 2013-12-09 MED ORDER — LIDOCAINE HCL (PF) 1 % IJ SOLN: 30.0000 mL | INTRAMUSCULAR | Status: DC | PRN

## 2013-12-09 MED ORDER — DIPHENHYDRAMINE HCL 25 MG PO CAPS
25.0000 mg | ORAL_CAPSULE | ORAL | Status: DC | PRN
  Administered 2013-12-10: 25 mg via ORAL
  Filled 2013-12-09 (×2): qty 1

## 2013-12-09 MED ORDER — SODIUM BICARBONATE 8.4 % IV SOLN
INTRAVENOUS | Status: AC
Start: 2013-12-09 — End: 2013-12-09
  Filled 2013-12-09: qty 50

## 2013-12-09 MED ORDER — MORPHINE SULFATE 0.5 MG/ML IJ SOLN
INTRAMUSCULAR | Status: AC
  Filled 2013-12-09: qty 10

## 2013-12-09 MED ORDER — FENTANYL CITRATE 0.05 MG/ML IJ SOLN
INTRAMUSCULAR | Status: DC | PRN
  Administered 2013-12-09: 50 ug via INTRAVENOUS

## 2013-12-09 MED ORDER — ONDANSETRON HCL 4 MG/2ML IJ SOLN: 4.0000 mg | Freq: Four times a day (QID) | INTRAMUSCULAR | Status: DC | PRN

## 2013-12-09 MED ORDER — OXYTOCIN BOLUS FROM INFUSION: 500.0000 mL | INTRAVENOUS | Status: DC

## 2013-12-09 MED ORDER — LACTATED RINGERS IV SOLN
INTRAVENOUS | Status: DC | PRN
  Administered 2013-12-09: 20:00:00 via INTRAVENOUS

## 2013-12-09 MED ORDER — 0.9 % SODIUM CHLORIDE (POUR BTL) OPTIME
TOPICAL | Status: DC | PRN
  Administered 2013-12-09: 1000 mL

## 2013-12-09 MED ORDER — OXYTOCIN 40 UNITS IN LACTATED RINGERS INFUSION - SIMPLE MED: 62.5000 mL/h | INTRAVENOUS | Status: DC

## 2013-12-09 MED ORDER — PHENYLEPHRINE 40 MCG/ML (10ML) SYRINGE FOR IV PUSH (FOR BLOOD PRESSURE SUPPORT)
PREFILLED_SYRINGE | INTRAVENOUS | Status: AC
  Filled 2013-12-09: qty 10

## 2013-12-09 MED ORDER — MEPERIDINE HCL 25 MG/ML IJ SOLN
6.2500 mg | INTRAMUSCULAR | Status: DC | PRN
Start: 2013-12-09 — End: 2013-12-10
  Administered 2013-12-09 (×2): 6.25 mg via INTRAVENOUS

## 2013-12-09 MED ORDER — CITRIC ACID-SODIUM CITRATE 334-500 MG/5ML PO SOLN
30.0000 mL | ORAL | Status: DC | PRN
  Administered 2013-12-09: 30 mL via ORAL
  Filled 2013-12-09: qty 15

## 2013-12-09 MED ORDER — OXYTOCIN 10 UNIT/ML IJ SOLN
40.0000 [IU] | INTRAVENOUS | Status: DC | PRN
  Administered 2013-12-09: 40 [IU] via INTRAVENOUS

## 2013-12-09 MED ORDER — CEFAZOLIN SODIUM-DEXTROSE 2-3 GM-% IV SOLR
INTRAVENOUS | Status: DC | PRN
  Administered 2013-12-09: 2 g via INTRAVENOUS

## 2013-12-09 MED ORDER — NALBUPHINE HCL 10 MG/ML IJ SOLN
5.0000 mg | INTRAMUSCULAR | Status: DC | PRN
  Administered 2013-12-09 – 2013-12-10 (×2): 10 mg via INTRAVENOUS
  Filled 2013-12-09: qty 1

## 2013-12-09 MED ORDER — ONDANSETRON HCL 4 MG/2ML IJ SOLN
INTRAMUSCULAR | Status: AC
Start: 2013-12-09 — End: 2013-12-09
  Filled 2013-12-09: qty 2

## 2013-12-09 MED ORDER — KETOROLAC TROMETHAMINE 60 MG/2ML IM SOLN
60.0000 mg | Freq: Once | INTRAMUSCULAR | Status: AC | PRN
  Administered 2013-12-09: 60 mg via INTRAMUSCULAR

## 2013-12-09 MED ORDER — IBUPROFEN 600 MG PO TABS: 600.0000 mg | ORAL_TABLET | Freq: Four times a day (QID) | ORAL | Status: DC | PRN

## 2013-12-09 MED ORDER — MEPERIDINE HCL 25 MG/ML IJ SOLN
INTRAMUSCULAR | Status: AC
  Filled 2013-12-09: qty 1

## 2013-12-09 MED ORDER — PHENYLEPHRINE HCL 10 MG/ML IJ SOLN
INTRAMUSCULAR | Status: DC | PRN
  Administered 2013-12-09: 80 ug via INTRAVENOUS
  Administered 2013-12-09: 40 ug via INTRAVENOUS
  Administered 2013-12-09: 80 ug via INTRAVENOUS
  Administered 2013-12-09: 40 ug via INTRAVENOUS
  Administered 2013-12-09 (×4): 80 ug via INTRAVENOUS
  Administered 2013-12-09: 40 ug via INTRAVENOUS

## 2013-12-09 MED ORDER — MEPERIDINE HCL 25 MG/ML IJ SOLN: 6.2500 mg | INTRAMUSCULAR | Status: DC | PRN

## 2013-12-09 MED ORDER — NALBUPHINE HCL 10 MG/ML IJ SOLN
INTRAMUSCULAR | Status: AC
  Filled 2013-12-09: qty 1

## 2013-12-09 MED ORDER — NALBUPHINE HCL 10 MG/ML IJ SOLN: 5.0000 mg | INTRAMUSCULAR | Status: DC | PRN

## 2013-12-09 MED ORDER — LACTATED RINGERS IV SOLN
500.0000 mL | Freq: Once | INTRAVENOUS | Status: AC
  Administered 2013-12-09: 500 mL via INTRAVENOUS

## 2013-12-09 MED ORDER — METOCLOPRAMIDE HCL 5 MG/ML IJ SOLN: 10.0000 mg | Freq: Once | INTRAMUSCULAR | Status: AC | PRN

## 2013-12-09 MED ORDER — FENTANYL 2.5 MCG/ML BUPIVACAINE 1/10 % EPIDURAL INFUSION (WH - ANES)
14.0000 mL/h | INTRAMUSCULAR | Status: DC | PRN
  Administered 2013-12-09: 12 mL/h via EPIDURAL

## 2013-12-09 SURGICAL SUPPLY — 35 items
APL SKNCLS STERI-STRIP NONHPOA (GAUZE/BANDAGES/DRESSINGS) ×1
BENZOIN TINCTURE PRP APPL 2/3 (GAUZE/BANDAGES/DRESSINGS) ×2 IMPLANT
CLAMP CORD UMBIL (MISCELLANEOUS) ×2 IMPLANT
CLOSURE WOUND 1/2 X4 (GAUZE/BANDAGES/DRESSINGS) ×1
CLOTH BEACON ORANGE TIMEOUT ST (SAFETY) ×3 IMPLANT
DRAPE LG THREE QUARTER DISP (DRAPES) ×2 IMPLANT
DRSG OPSITE POSTOP 4X10 (GAUZE/BANDAGES/DRESSINGS) ×3 IMPLANT
DURAPREP 26ML APPLICATOR (WOUND CARE) ×1 IMPLANT
ELECT REM PT RETURN 9FT ADLT (ELECTROSURGICAL) ×3
ELECTRODE REM PT RTRN 9FT ADLT (ELECTROSURGICAL) ×1 IMPLANT
EXTRACTOR VACUUM KIWI (MISCELLANEOUS) IMPLANT
GLOVE BIO SURGEON STRL SZ 6.5 (GLOVE) ×2 IMPLANT
GLOVE BIO SURGEONS STRL SZ 6.5 (GLOVE) ×1
GOWN PREVENTION PLUS XLARGE (GOWN DISPOSABLE) ×6 IMPLANT
KIT ABG SYR 3ML LUER SLIP (SYRINGE) IMPLANT
NDL HYPO 25X5/8 SAFETYGLIDE (NEEDLE) IMPLANT
NEEDLE HYPO 25X5/8 SAFETYGLIDE (NEEDLE) IMPLANT
NS IRRIG 1000ML POUR BTL (IV SOLUTION) ×3 IMPLANT
PACK C SECTION WH (CUSTOM PROCEDURE TRAY) ×3 IMPLANT
PAD OB MATERNITY 4.3X12.25 (PERSONAL CARE ITEMS) ×3 IMPLANT
RTRCTR C-SECT PINK 25CM LRG (MISCELLANEOUS) ×1 IMPLANT
STRIP CLOSURE SKIN 1/2X4 (GAUZE/BANDAGES/DRESSINGS) ×1 IMPLANT
SUT CHROMIC 1 CTX 36 (SUTURE) ×8 IMPLANT
SUT PLAIN 0 NONE (SUTURE) IMPLANT
SUT PLAIN 2 0 XLH (SUTURE) ×1 IMPLANT
SUT VIC AB 0 CT1 27 (SUTURE) ×6
SUT VIC AB 0 CT1 27XBRD ANBCTR (SUTURE) ×2 IMPLANT
SUT VIC AB 2-0 CT1 27 (SUTURE) ×3
SUT VIC AB 2-0 CT1 TAPERPNT 27 (SUTURE) ×1 IMPLANT
SUT VIC AB 3-0 CT1 27 (SUTURE)
SUT VIC AB 3-0 CT1 TAPERPNT 27 (SUTURE) IMPLANT
SUT VIC AB 4-0 KS 27 (SUTURE) ×3 IMPLANT
TOWEL OR 17X24 6PK STRL BLUE (TOWEL DISPOSABLE) ×3 IMPLANT
TRAY FOLEY CATH 14FR (SET/KITS/TRAYS/PACK) ×3 IMPLANT
WATER STERILE IRR 1000ML POUR (IV SOLUTION) ×3 IMPLANT

## 2013-12-09 NOTE — Progress Notes (Signed)
Patient ID: Joyce Solomon, female   DOB: 25-Sep-1986, 28 y.o.   MRN: [id number] Late entry Note  CTSP for FHR deceleration to 70 with slow recovery.  Pt had first FHR deceleration down to 70 shortly after 700 pm and pitocin turned off, fluid bolused, and O2 applied.  An amnioiinfusion was also begun. Baby initially recovered but continued to have variable and late decelerations.  At [place] I was called to room for another deceleration and pt examined with vertex still at a -1 station/6cm.  The patient was noted to have excessive vaginal bleeding as well.  We attempted several position changes, including knee/chest but FHR would not respond to any intervention and remained 50-60.  I then called a STAT c-section at about 1951, 6 minutes into the deceleration and quickly counseled the patient on the surgery.  She was in the operating room at 1952 and the baby delivered at 35.  We did have time to count instruments,  perform a betadine wash, and administer Ancef.  Please see operative note for full detail.

## 2013-12-09 NOTE — Progress Notes (Signed)
Patient ID: [name], female   DOB: 1986/06/09, 28 y.o.   MRN: [id number] Pt feeling mild contrations FHR category 1 Cervix 50/1-2/-2 AROM clear

## 2013-12-09 NOTE — Anesthesia Procedure Notes (Signed)
Epidural Patient location during procedure: OB  Preanesthetic Checklist Completed: patient identified, site marked, surgical consent, pre-op evaluation, timeout performed, IV checked, risks and benefits discussed and monitors and equipment checked  Epidural Patient position: sitting Prep: site prepped and draped and DuraPrep Patient monitoring: continuous pulse ox and blood pressure Approach: midline Injection technique: LOR air  Needle:  Needle type: Tuohy  Needle gauge: 17 G Needle length: 9 cm and 9 Needle insertion depth: 6 cm Catheter type: closed end flexible Catheter size: 19 Gauge Catheter at skin depth: 12 cm Test dose: negative  Assessment Events: blood not aspirated, injection not painful, no injection resistance, negative IV test and no paresthesia  Additional Notes Dosing of Epidural:  1st dose, through catheter ............................................[name] epi 1:200K + Xylocaine 40 mg  2nd dose, through catheter, after waiting 3 minutes...Marland KitchenMarland Kitchenepi 1:200K + Xylocaine 40 mg   ( 2% Xylo charted as a single dose in Epic Meds for ease of charting; actual dosing was fractionated as above, for saftey's sake)  As each dose occurred, patient was free of IV sx; and patient exhibited no evidence of SA injection.  Patient is more comfortable after epidural dosed. Please see RN's note for documentation of vital signs,and FHR which are stable.  Patient reminded not to try to ambulate with numb legs, and that an RN must be present the 1st time she attempts to get up.

## 2013-12-09 NOTE — Anesthesia Preprocedure Evaluation (Addendum)
Anesthesia Evaluation  Patient identified by MRN, date of birth, ID band Patient awake    Reviewed: Allergy & Precautions, H&P , Patient's Chart, lab work & pertinent test results  Airway Mallampati: II TM Distance: >3 FB Neck ROM: full    Dental  (+) Teeth Intact   Pulmonary former smoker,  breath sounds clear to auscultation        Cardiovascular Rhythm:regular Rate:Normal     Neuro/Psych    GI/Hepatic Ulcerative colitis   Endo/Other    Renal/GU      Musculoskeletal   Abdominal   Peds  Hematology H/o blood transfusion   Anesthesia Other Findings       Reproductive/Obstetrics (+) Pregnancy (fetal distress --> STAT C/S)                          Anesthesia Physical Anesthesia Plan  ASA: II and emergent  Anesthesia Plan: Epidural   Post-op Pain Management:    Induction:   Airway Management Planned:   Additional Equipment:   Intra-op Plan:   Post-operative Plan:   Informed Consent: I have reviewed the patients History and Physical, chart, labs and discussed the procedure including the risks, benefits and alternatives for the proposed anesthesia with the patient or authorized representative who has indicated his/her understanding and acceptance.   Dental Advisory Given  Plan Discussed with: Surgeon and CRNA  Anesthesia Plan Comments: (Labs checked- platelets confirmed with RN in room. Fetal heart tracing, per RN, reported to be stable enough for sitting procedure. Discussed epidural, and patient consents to the procedure:  included risk of possible headache,backache, failed block, allergic reaction, and nerve injury. This patient was asked if she had any questions or concerns before the procedure started.)       Anesthesia Quick Evaluation

## 2013-12-09 NOTE — Op Note (Signed)
Operative report  Preoperative diagnosis Term pregnancy at 38-6/7 weeks Suspected IUGR Fetal distress in labor with suspected placental abruption  Postoperative diagnosis Same  Procedure Stat primary low transverse C-section with 2 layer closure of uterus  Surgeon Dr. Paula Compton  Anesthesia Epidural  Findings There was a viable female infant in the vertex presentation. There was bloody fluid and blood clots within the uterine cavity upon opening the uterine incision consistent with a probable abruption. There was also a loop of umbilical cord beside the infant's head. Apgars were 8 and 8. Weight pending at time of cesarean section.  Fluids Estimated blood loss 800 cc Urine output 200 cc clear IV fluids 2200 cc LR  Specimen Placenta sent to pathology  Procedure note As stated previously the fetal heart rate had a deceleration to 50-60 with no recovery after 6 minutes and a stat cesarean section was called. Patient was brought to the operating room within a minute and prepped for surgery. A fetal heart rate was obtained in the OR and was 90 initially then dropped to 70. A quick abdominal prep with Betadine was performed, the patient given 2 g of Ancef, and as soon as possible anesthesia found to be adequate a quick time out performed. A Pfannenstiel skin incision was then made with the scalpel and carried through to the underlying layer of fascia by sharp dissection the fascia was nicked in the midline and extended laterally with Mayo scissors.  The rectus muscles were separated in the peritoneal cavity entered bluntly the incision was extended bluntly. The lower uterine segment of the uterus was then incised in a transverse fashion and the cavity itself entered bluntly. A large amount of bloody fluid was noted with clots as well. The infant's head was then delivered atraumatically the nose and mouth bulb suctioned. A loop of cord was noted by the baby's head.  The remainder of the  baby delivered without difficulty the cord was clamped and cut and infant handed to the waiting pediatricians. A cord gas and cord blood were obtained and the placenta then expressed from the uterine cavity. The uterus was initially atonic but responded well to bimanual massage and Pitocin. The uterine incision was then closed in 2 layers the first a running locked layer of 1-0 chromic. The second was an imbricating layer of the same suture. The left angle was reinforced with the area of oozing with a figure-of-eight suture of 1-0 chromic. Good hemostasis was then noted. The gutters were then cleared of all clots and blood and no active bleeding noted. The tubes and ovaries were inspected and found to be normal. The uterine incision appeared hemostatic. All instruments and sponges were therefore removed from the abdomen and the rectus muscles and peritoneum were reapproximated with several interrupted mattress sutures of 2-0 Vicryl.  The fascia was then closed with 0 Vicryl in a running fashion.  The subcutaneous tissue was reapproximated with running suture of 1-0 chromic. The skin was closed with 4-0 Vicryl in a subcuticular stitch on a Keith needle. All instrument and sponge counts were then correct and the patient was taken to the recovery room in good condition with her baby able to accompany her.

## 2013-12-09 NOTE — Progress Notes (Signed)
Dr. Marvel Plan at bedside, decision for STAT cesarean.

## 2013-12-09 NOTE — Transfer of Care (Signed)
Immediate Anesthesia Transfer of Care Note  Patient: Joyce Solomon  Procedure(s) Performed: Procedure(s): Primary Cesarean Section Delivery Baby Girl @ [phone], Apgars 8/8 (N/A)  Patient Location: PACU  Anesthesia Type:Epidural  Level of Consciousness: awake, alert  and oriented  Airway & Oxygen Therapy: Patient Spontanous Breathing  Post-op Assessment: Report given to PACU RN and Post -op Vital signs reviewed and stable  Post vital signs: Reviewed and stable  Complications: No apparent anesthesia complications

## 2013-12-09 NOTE — Brief Op Note (Signed)
12/09/2013  9:08 PM  PATIENT:  Joyce Solomon  28 y.o. female  PRE-OPERATIVE DIAGNOSIS:  Fetal Distress Suspected abruption  POST-OPERATIVE DIAGNOSIS:  same  PROCEDURE:  Procedure(s): Primary Cesarean Section Delivery Baby Girl @ 69, Apgars 8/8 (N/A)  SURGEON:  Surgeon(s) and Role:    * Logan Bores, MD - Primary   ANESTHESIA:   spinal  EBL:  Total I/O In: 2500 [I.V.:2500] Out: 1000 [Urine:400; Blood:600]  BLOOD ADMINISTERED:none  DRAINS: Urinary Catheter (Foley)   LOCAL MEDICATIONS USED:  NONE  SPECIMEN: Placenta   DISPOSITION OF SPECIMEN:  PATHOLOGY  COUNTS:  YES  TOURNIQUET:  * No tourniquets in log *  DICTATION: .Dragon Dictation  PLAN OF CARE: Admit to inpatient   PATIENT DISPOSITION:  PACU - hemodynamically stable.

## 2013-12-09 NOTE — Progress Notes (Signed)
Dr Marvel Plan called and made aware of FHR decelerations. Pitocin turned off, fluid bolus given and O2 given via face mask. Vaginal exam 5.5 Order to start amnioinfusion. Solmon Ice, RN at bedside for care.

## 2013-12-09 NOTE — Progress Notes (Signed)
   Subjective: Pt comfortable with epidural  Objective: BP 102/47  Pulse 75  Temp(Src) 97.8 F (36.6 C) (Oral)  Resp 20  Ht 4' 11"  (1.499 m)  Wt 68.947 kg (152 lb)  BMI 30.68 kg/m2  SpO2 100%  LMP 03/12/2013      FHT:  FHR: 120 bpm, variability: moderate,  accelerations:  Present,  decelerations:  Present mild variables occasionally UC:   regular, every 3  minutes SVE:   Dilation: 3 Effacement (%): 70 Station: -2;-1 Exam by:: Dr Marvel Plan  Labs: Lab Results  Component Value Date   WBC 7.6 12/09/2013   HGB 11.6* 12/09/2013   HCT 34.8* 12/09/2013   MCV 85.5 12/09/2013   PLT 237 12/09/2013    Assessment / Plan: IUPC placed to adjust pitocin FHR reassuring  Kenecia Barren W 12/09/2013, 1:45 PM

## 2013-12-09 NOTE — Progress Notes (Signed)
Patient ID: Joyce Solomon, female   DOB: 03-08-1986, 29 y.o.   MRN: [id number] Pt doing well, comfortable with epidural.   FHR category 1 with mild intermittent variables. Cervix 80/4-5/-1  Following progress. Close to adequate with IUPC

## 2013-12-10 DIAGNOSIS — Z98891 History of uterine scar from previous surgery: Secondary | ICD-10-CM

## 2013-12-10 DIAGNOSIS — O34219 Maternal care for unspecified type scar from previous cesarean delivery: Secondary | ICD-10-CM

## 2013-12-10 LAB — CBC
HCT: 29.3 % — ABNORMAL LOW (ref 36.0–46.0)
Hemoglobin: 9.8 g/dL — ABNORMAL LOW (ref 12.0–15.0)
MCH: 28.6 pg (ref 26.0–34.0)
MCHC: 33.4 g/dL (ref 30.0–36.0)
MCV: 85.4 fL (ref 78.0–100.0)
Platelets: 199 10*3/uL (ref 150–400)
RBC: 3.43 MIL/uL — ABNORMAL LOW (ref 3.87–5.11)
RDW: 14.2 % (ref 11.5–15.5)
WBC: 11.7 10*3/uL — AB (ref 4.0–10.5)

## 2013-12-10 MED ORDER — SIMETHICONE 80 MG PO CHEW
80.0000 mg | CHEWABLE_TABLET | Freq: Three times a day (TID) | ORAL | Status: DC
  Administered 2013-12-10 – 2013-12-11 (×5): 80 mg via ORAL
  Filled 2013-12-10 (×5): qty 1

## 2013-12-10 MED ORDER — MENTHOL 3 MG MT LOZG: 1.0000 | LOZENGE | OROMUCOSAL | Status: DC | PRN

## 2013-12-10 MED ORDER — METOCLOPRAMIDE HCL 5 MG/ML IJ SOLN: 10.0000 mg | Freq: Three times a day (TID) | INTRAMUSCULAR | Status: DC | PRN

## 2013-12-10 MED ORDER — OXYCODONE-ACETAMINOPHEN 5-325 MG PO TABS
1.0000 | ORAL_TABLET | ORAL | Status: DC | PRN
  Administered 2013-12-10: 2 via ORAL
  Administered 2013-12-10: 1 via ORAL
  Administered 2013-12-10 – 2013-12-11 (×3): 2 via ORAL
  Administered 2013-12-11 (×2): 1 via ORAL
  Administered 2013-12-11 – 2013-12-12 (×3): 2 via ORAL
  Filled 2013-12-10 (×2): qty 2
  Filled 2013-12-10: qty 1
  Filled 2013-12-10 (×5): qty 2
  Filled 2013-12-10 (×2): qty 1
  Filled 2013-12-10: qty 2

## 2013-12-10 MED ORDER — ACETAMINOPHEN 325 MG PO TABS: 975.0000 mg | ORAL_TABLET | Freq: Four times a day (QID) | ORAL | Status: DC | PRN

## 2013-12-10 MED ORDER — SODIUM CHLORIDE 0.9 % IJ SOLN: 3.0000 mL | INTRAMUSCULAR | Status: DC | PRN

## 2013-12-10 MED ORDER — KETOROLAC TROMETHAMINE 30 MG/ML IJ SOLN: 30.0000 mg | Freq: Four times a day (QID) | INTRAMUSCULAR | Status: AC | PRN

## 2013-12-10 MED ORDER — LACTATED RINGERS IV SOLN
INTRAVENOUS | Status: DC
  Administered 2013-12-10 (×2): via INTRAVENOUS

## 2013-12-10 MED ORDER — IBUPROFEN 600 MG PO TABS
600.0000 mg | ORAL_TABLET | Freq: Four times a day (QID) | ORAL | Status: DC
  Administered 2013-12-10 – 2013-12-12 (×10): 600 mg via ORAL
  Filled 2013-12-10 (×10): qty 1

## 2013-12-10 MED ORDER — SIMETHICONE 80 MG PO CHEW: 80.0000 mg | CHEWABLE_TABLET | ORAL | Status: DC | PRN

## 2013-12-10 MED ORDER — OXYTOCIN 40 UNITS IN LACTATED RINGERS INFUSION - SIMPLE MED: 62.5000 mL/h | INTRAVENOUS | Status: AC

## 2013-12-10 MED ORDER — ONDANSETRON HCL 4 MG/2ML IJ SOLN: 4.0000 mg | Freq: Three times a day (TID) | INTRAMUSCULAR | Status: DC | PRN

## 2013-12-10 MED ORDER — SIMETHICONE 80 MG PO CHEW
80.0000 mg | CHEWABLE_TABLET | ORAL | Status: DC
  Administered 2013-12-10 – 2013-12-11 (×2): 80 mg via ORAL
  Filled 2013-12-10 (×2): qty 1

## 2013-12-10 MED ORDER — SENNOSIDES-DOCUSATE SODIUM 8.6-50 MG PO TABS
2.0000 | ORAL_TABLET | ORAL | Status: DC
  Administered 2013-12-10 – 2013-12-11 (×2): 2 via ORAL
  Filled 2013-12-10 (×3): qty 2

## 2013-12-10 MED ORDER — CEFAZOLIN SODIUM 1-5 GM-% IV SOLN
1.0000 g | Freq: Three times a day (TID) | INTRAVENOUS | Status: AC
  Administered 2013-12-10 (×3): 1 g via INTRAVENOUS
  Filled 2013-12-10 (×3): qty 50

## 2013-12-10 MED ORDER — ONDANSETRON HCL 4 MG PO TABS: 4.0000 mg | ORAL_TABLET | ORAL | Status: DC | PRN

## 2013-12-10 MED ORDER — NALOXONE HCL 1 MG/ML IJ SOLN
1.0000 ug/kg/h | INTRAVENOUS | Status: DC | PRN
  Filled 2013-12-10: qty 2

## 2013-12-10 MED ORDER — NALOXONE HCL 0.4 MG/ML IJ SOLN: 0.4000 mg | INTRAMUSCULAR | Status: DC | PRN

## 2013-12-10 MED ORDER — WITCH HAZEL-GLYCERIN EX PADS: 1.0000 "application " | MEDICATED_PAD | CUTANEOUS | Status: DC | PRN

## 2013-12-10 MED ORDER — PRENATAL MULTIVITAMIN CH
1.0000 | ORAL_TABLET | Freq: Every day | ORAL | Status: DC
  Administered 2013-12-10 – 2013-12-12 (×3): 1 via ORAL
  Filled 2013-12-10 (×3): qty 1

## 2013-12-10 MED ORDER — DIPHENHYDRAMINE HCL 25 MG PO CAPS: 25.0000 mg | ORAL_CAPSULE | Freq: Four times a day (QID) | ORAL | Status: DC | PRN

## 2013-12-10 MED ORDER — LANOLIN HYDROUS EX OINT: 1.0000 "application " | TOPICAL_OINTMENT | CUTANEOUS | Status: DC | PRN

## 2013-12-10 MED ORDER — DIBUCAINE 1 % RE OINT: 1.0000 "application " | TOPICAL_OINTMENT | RECTAL | Status: DC | PRN

## 2013-12-10 MED ORDER — TETANUS-DIPHTH-ACELL PERTUSSIS 5-2.5-18.5 LF-MCG/0.5 IM SUSP: 0.5000 mL | Freq: Once | INTRAMUSCULAR | Status: DC

## 2013-12-10 MED ORDER — ONDANSETRON HCL 4 MG/2ML IJ SOLN: 4.0000 mg | INTRAMUSCULAR | Status: DC | PRN

## 2013-12-10 MED ORDER — ZOLPIDEM TARTRATE 5 MG PO TABS: 5.0000 mg | ORAL_TABLET | Freq: Every evening | ORAL | Status: DC | PRN

## 2013-12-10 NOTE — Lactation Note (Addendum)
This note was copied from the chart of Joyce Solomon. Lactation Consultation Note Initial consult:  Baby 20 hours old and mother would like help with breastfeeding.  Mother breastfed older child for two weeks but stopped due to the medication she was taking.  Plans on stopping again after two weeks.  Asked her if her physician recommended she stop nursing when she starts her medication and she said yes.  Reviewed hand expression with mom.  Assisted baby in football position.  Baby sucked intermittently, some swallows heard.  Reviewed breastfeeding basics, hand pump, actation support services and brochure.  Encouraged mother to call lactation or nurse if she needs further assistance or questions.   Patient Name: Joyce Solomon Today's Date: 12/10/2013 Reason for consult: Initial assessment   Maternal Data Formula Feeding for Exclusion: Yes Infant to breast within first hour of birth: Yes Has patient been taught Hand Expression?: Yes Does the patient have breastfeeding experience prior to this delivery?: Yes  Feeding Feeding Type: Breast Fed  LATCH Score/Interventions Latch: Repeated attempts needed to sustain latch, nipple held in mouth throughout feeding, stimulation needed to elicit sucking reflex.  Audible Swallowing: A few with stimulation Intervention(s): Skin to skin;Hand expression;Alternate breast massage  Type of Nipple: Everted at rest and after stimulation  Comfort (Breast/Nipple): Soft / non-tender     Hold (Positioning): Assistance needed to correctly position infant at breast and maintain latch.  LATCH Score: 7  Lactation Tools Discussed/Used Tools: Pump   Consult Status Consult Status: Follow-up Date: 12/11/13 Follow-up type: In-patient    Joyce Solomon [facility] 12/10/2013, 11:31 AM

## 2013-12-10 NOTE — Anesthesia Postprocedure Evaluation (Signed)
  Anesthesia Post-op Note  Anesthesia Post Note  Patient: Joyce Solomon  Procedure(s) Performed: Procedure(s) (LRB): Primary Cesarean Section Delivery Baby Girl @ [phone], Apgars 8/8 (N/A)  Anesthesia type: Epidural  Patient location: PACU  Post pain: Pain level controlled  Post assessment: Post-op Vital signs reviewed  Post vital signs: stable  Level of consciousness: awake  Complications: No apparent anesthesia complications

## 2013-12-10 NOTE — Progress Notes (Signed)
Subjective: Postpartum Day1  Cesarean Delivery Patient reports incisional pain and tolerating PO.    Objective: Vital signs in last 24 hours: Temp:  [97.5 F (36.4 C)-99.6 F (37.6 C)] 98.2 F (36.8 C) (01/21 0643) Pulse Rate:  [67-119] 74 (01/21 0643) Resp:  [14-43] 18 (01/21 0643) BP: (56-136)/(34-88) 93/41 mmHg (01/21 0643) SpO2:  [93 %-100 %] 95 % (01/21 2800)  Physical Exam:  General: alert and cooperative Lochia: appropriate Uterine Fundus: firm Incision: C/D/I    Recent Labs  12/09/13 0805 12/10/13 0559  HGB 11.6* 9.8*  HCT 34.8* 29.3*    Assessment/Plan: Status post Cesarean section. Doing well postoperatively.  Continue current care.  Logan Bores 12/10/2013, 9:17 AM

## 2013-12-10 NOTE — Anesthesia Postprocedure Evaluation (Signed)
  Anesthesia Post-op Note  Patient: Joyce Solomon  Procedure(s) Performed: Procedure(s): Primary Cesarean Section Delivery Baby Girl @ [phone], Apgars 8/8 (N/A)  Patient Location: Mother/Baby  Anesthesia Type:Epidural  Level of Consciousness: awake, alert , oriented and patient cooperative  Airway and Oxygen Therapy: Patient Spontanous Breathing  Post-op Pain: none  Post-op Assessment: Patient's Cardiovascular Status Stable, Respiratory Function Stable, Patent Airway, No signs of Nausea or vomiting, Adequate PO intake, Pain level controlled, No headache, No backache, No residual numbness and No residual motor weakness  Post-op Vital Signs: Reviewed and stable  Complications: No apparent anesthesia complications

## 2013-12-10 NOTE — Progress Notes (Signed)
Ur chart review completed.  

## 2013-12-11 ENCOUNTER — Encounter (HOSPITAL_COMMUNITY): Payer: Self-pay | Admitting: Obstetrics and Gynecology

## 2013-12-11 NOTE — Lactation Note (Signed)
This note was copied from the chart of Girl Kavitha Lansdale. Lactation Consultation Note Follow up consult:  Baby 67 hours old and sleeping in FOB arms.  Mother states breastfeeding is going fine.  Mother denies any soreness or questions.  Mother is breast and formula.  Encouraged mother to call nurse or lactation for further assistance or questions.   Patient Name: Girl Fawna Cranmer [id number] Date: 12/11/2013 Reason for consult: Follow-up assessment   Maternal Data    Feeding    LATCH Score/Interventions                      Lactation Tools Discussed/Used     Consult Status Consult Status: PRN    Carlye Grippe 12/11/2013, 3:43 PM

## 2013-12-11 NOTE — Lactation Note (Signed)
This note was copied from the chart of Joyce Keandra Medero. Lactation Consultation Note Follow up visit at 32 hours of age.  Mom requests latch assist.  Baby is asleep on the chest in clothes.  Mom reports baby was showing some feeding cues and has now stopped.  Mom reports last feeding at 1400 (4 hours ago).  Encouraged mom to undress baby and place skin to skin with colostrum hand expressed.  Baby sleepy at the breast and sucking on her tongue.  Had baby suck on gloved finger to assist with wide open mouth for deep latch.  Mom unsure about positioning and holding, encouragement provided.  Moms breast are filling and gulps are heard.  Observed 10 minutes of feeding.  Encouraged suck training for parents to try.  Mom to call for assist as needed.    Patient Name: Joyce Solomon [id number] Date: 12/11/2013 Reason for consult: Follow-up assessment;Difficult latch   Maternal Data    Feeding Feeding Type: Breast Fed Length of feed:  (observed 10 minutes with swallows)  LATCH Score/Interventions Latch: Repeated attempts needed to sustain latch, nipple held in mouth throughout feeding, stimulation needed to elicit sucking reflex. Intervention(s): Breast compression;Assist with latch;Adjust position  Audible Swallowing: Spontaneous and intermittent Intervention(s): Hand expression;Skin to skin  Type of Nipple: Everted at rest and after stimulation  Comfort (Breast/Nipple): Soft / non-tender     Hold (Positioning): Assistance needed to correctly position infant at breast and maintain latch. Intervention(s): Position options;Support Pillows;Breastfeeding basics reviewed;Skin to skin  LATCH Score: 8  Lactation Tools Discussed/Used     Consult Status Consult Status: Follow-up Date: 12/12/13 Follow-up type: In-patient    Shoptaw, Justine Null 12/11/2013, 6:18 PM

## 2013-12-11 NOTE — Progress Notes (Signed)
Subjective: Postpartum Day 2 Cesarean Delivery Patient reports tolerating PO and no problems voiding.    Objective: Vital signs in last 24 hours: Temp:  [98.2 F (36.8 C)-98.7 F (37.1 C)] 98.2 F (36.8 C) (01/22 0629) Pulse Rate:  [82-89] 83 (01/22 0629) Resp:  [16-18] 16 (01/22 0629) BP: (105-135)/(66-83) 112/66 mmHg (01/22 0629) SpO2:  [97 %] 97 % (01/21 1500)  Physical Exam:  General: cooperative Lochia: appropriate Uterine Fundus: firm Incision: C/D/I    Recent Labs  12/09/13 0805 12/10/13 0559  HGB 11.6* 9.8*  HCT 34.8* 29.3*    Assessment/Plan: Status post Cesarean section. Doing well postoperatively.  Continue current care. Plan d/c tomorrow  Logan Bores 12/11/2013, 8:42 AM

## 2013-12-12 MED ORDER — IBUPROFEN 600 MG PO TABS: 600.0000 mg | ORAL_TABLET | Freq: Four times a day (QID) | ORAL | Status: DC

## 2013-12-12 MED ORDER — OXYCODONE-ACETAMINOPHEN 5-325 MG PO TABS: 1.0000 | ORAL_TABLET | ORAL | Status: DC | PRN

## 2013-12-12 NOTE — Discharge Summary (Signed)
Obstetric Discharge Summary Reason for Admission: induction of labor for possible IUGR, poor growth Prenatal Procedures: NST and ultrasound Intrapartum Procedures: cesarean: low cervical, transverse, stat c-section for fetal bradycardia Postpartum Procedures: antibiotics Complications-Operative and Postpartum: none Hemoglobin  Date Value Range Status  12/10/2013 9.8* 12.0 - 15.0 g/dL Final     HCT  Date Value Range Status  12/10/2013 29.3* 36.0 - 46.0 % Final    Physical Exam:  General: alert and cooperative Lochia: appropriate Uterine Fundus: firm Incision: healing well  Discharge Diagnoses: Term Pregnancy-delivered  Discharge Information: Date: 12/12/2013 Activity: pelvic rest Diet: routine Medications: Ibuprofen and Percocet Condition: improved Instructions: refer to practice specific booklet Discharge to: home Follow-up Information   Follow up with Logan Bores, MD. Schedule an appointment as soon as possible for a visit in 2 weeks. (incision check)    Specialty:  Obstetrics and Gynecology   Contact information:   [address], [place] [address] [phone]       Newborn Data: Live born female  Birth Weight: 5 lb 14.5 oz (2680 g) APGAR: 8, 8  Home with mother.  Logan Bores 12/12/2013, 8:34 AM

## 2013-12-12 NOTE — Progress Notes (Signed)
Subjective: Postpartum Day 3: Cesarean Delivery Patient reports tolerating PO, + flatus and no problems voiding.    Objective: Vital signs in last 24 hours: Temp:  [97.8 F (36.6 C)-98 F (36.7 C)] 97.8 F (36.6 C) (01/23 0540) Pulse Rate:  [76-82] 76 (01/23 0540) Resp:  [16-18] 16 (01/23 0540) BP: (108-109)/(68-70) 109/70 mmHg (01/23 0540)  Physical Exam:  General: alert and cooperative Lochia: appropriate Uterine Fundus: firm Incision: healing well   Recent Labs  12/10/13 0559  HGB 9.8*  HCT 29.3*    Assessment/Plan: Status post Cesarean section. Doing well postoperatively.  Discharge home with standard precautions and return to clinic in 2 weeks.  Logan Bores 12/12/2013, 8:31 AM

## 2013-12-12 NOTE — Lactation Note (Addendum)
This note was copied from the chart of Joyce Solomon. Lactation Consultation Note  Patient Name: Joyce Macenzie Burford [id number] Date: 12/12/2013 Reason for consult: Follow-up assessment;Infant < 6lbs Infant 74 hours old. Mom nursing baby, can hear swallowing/gulping. Mom dripping transitional milk from alternate breast. Mom feeding in the football position. Assisted mom to raise baby's body up toward the breast so that the baby's nose is at the nipple. Observed baby feed for 5 minutes, baby seems satisfied and to have fed efficiently. Reviewed engorgement prevention and treatment with mom, and referred mom to the [facility] Baby and Me booklet for storage of breast milk. Enc mom to call [facility] office if needs any further assistance as mom plans to nurse for two weeks until she resumes her medication protocol. Enc mom to feed with cues and not go any longer than 3 hours between feed.   Maternal Data    Feeding Feeding Type: Breast Fed Length of feed: 10 min  LATCH Score/Interventions Latch: Grasps breast easily, tongue down, lips flanged, rhythmical sucking. Intervention(s): Adjust position;Assist with latch  Audible Swallowing: Spontaneous and intermittent  Type of Nipple: Everted at rest and after stimulation  Comfort (Breast/Nipple): Filling, red/small blisters or bruises, mild/mod discomfort  Problem noted: Filling Interventions (Filling): Hand pump (Taught mom how to perform reverse pressure.)  Hold (Positioning): Assistance needed to correctly position infant at breast and maintain latch. Intervention(s): Support Pillows;Breastfeeding basics reviewed  LATCH Score: 8  Lactation Tools Discussed/Used Tools: Pump Breast pump type: Manual   Consult Status Consult Status: Complete    Inocente Salles 12/12/2013, 9:23 AM

## 2013-12-15 ENCOUNTER — Inpatient Hospital Stay (HOSPITAL_COMMUNITY): Admission: RE | Admit: 2013-12-15 | Payer: Medicaid Other | Source: Ambulatory Visit

## 2013-12-15 NOTE — Addendum Note (Signed)
Addendum created 12/15/13 1802 by Lyndle Herrlich, MD   Modules edited: Anesthesia Responsible Staff

## 2013-12-30 ENCOUNTER — Encounter (HOSPITAL_COMMUNITY): Payer: Self-pay | Admitting: Emergency Medicine

## 2013-12-30 ENCOUNTER — Emergency Department (HOSPITAL_COMMUNITY)
Admission: EM | Admit: 2013-12-30 | Discharge: 2013-12-30 | Disposition: A | Discharge status: Home / Self Care | Payer: Medicaid Other | Source: Home / Self Care | Attending: Family Medicine | Admitting: Family Medicine

## 2013-12-30 DIAGNOSIS — K5190 Ulcerative colitis, unspecified, without complications: Secondary | ICD-10-CM

## 2013-12-30 LAB — POCT I-STAT, CHEM 8
BUN: 10 mg/dL (ref 6–23)
CHLORIDE: 106 meq/L (ref 96–112)
Calcium, Ion: 1.26 mmol/L — ABNORMAL HIGH (ref 1.12–1.23)
Creatinine, Ser: 0.6 mg/dL (ref 0.50–1.10)
GLUCOSE: 89 mg/dL (ref 70–99)
HCT: 46 % (ref 36.0–46.0)
Hemoglobin: 15.6 g/dL — ABNORMAL HIGH (ref 12.0–15.0)
Potassium: 3.9 mEq/L (ref 3.7–5.3)
Sodium: 142 mEq/L (ref 137–147)
TCO2: 22 mmol/L (ref 0–100)

## 2013-12-30 LAB — CBC
HCT: 39.8 % (ref 36.0–46.0)
Hemoglobin: 13.3 g/dL (ref 12.0–15.0)
MCH: 28.7 pg (ref 26.0–34.0)
MCHC: 33.4 g/dL (ref 30.0–36.0)
MCV: 85.8 fL (ref 78.0–100.0)
Platelets: 405 10*3/uL — ABNORMAL HIGH (ref 150–400)
RBC: 4.64 MIL/uL (ref 3.87–5.11)
RDW: 15.5 % (ref 11.5–15.5)
WBC: 6.7 10*3/uL (ref 4.0–10.5)

## 2013-12-30 MED ORDER — MESALAMINE 1.2 G PO TBEC: 2.4000 g | DELAYED_RELEASE_TABLET | Freq: Every day | ORAL | Status: DC

## 2013-12-30 MED ORDER — PREDNISONE 20 MG PO TABS: 20.0000 mg | ORAL_TABLET | Freq: Every day | ORAL | Status: DC

## 2013-12-30 NOTE — ED Provider Notes (Signed)
Joyce Solomon is a 28 y.o. female who presents to Urgent Care today for ulcerative colitis flare. Patient has had 2 weeks of worsening bloody diarrhea. This is similar to previous ulcerative colitis flare. She streaks postpartum and has been out of her medications for some time. She was previously taking care of by [facility] gastroenterology. She denies any fevers or chills or nausea. She previously was well controlled with mesalamine.   Past Medical History  Diagnosis Date  . Anemia   . Chronic ulcerative colitis   . Headache(784.0)   . Blood transfusion without reported diagnosis     "year ago" b/c of anemia & UC   History  Substance Use Topics  . Smoking status: Former Smoker -- 3 years    Types: Cigarettes    Quit date: 05/16/2010  . Smokeless tobacco: Never Used  . Alcohol Use: No   ROS as above Medications: No current facility-administered medications for this encounter.   Current Outpatient Prescriptions  Medication Sig Dispense Refill  . Prenatal Vit-Fe Fumarate-FA (PRENATAL MULTIVITAMIN) TABS tablet Take 1 tablet by mouth every evening.      . mesalamine (LIALDA) 1.2 G EC tablet Take 2 tablets (2.4 g total) by mouth daily with breakfast.  60 tablet  0  . predniSONE (DELTASONE) 20 MG tablet Take 1 tablet (20 mg total) by mouth daily.  20 tablet  0    Exam:  BP 130/93  Pulse 73  Temp(Src) 98.6 F (37 C) (Oral)  Resp 16  SpO2 100%  Breastfeeding? No Gen: Well NAD HEENT: EOMI,  MMM Lungs: Normal work of breathing. CTABL Heart: RRR no MRG Abd: NABS, Soft. NT, ND Exts: Brisk capillary refill, warm and well perfused.   Results for orders placed during the hospital encounter of 12/30/13 (from the past 24 hour(s))  POCT I-STAT, CHEM 8     Status: Abnormal   Collection Time    12/30/13  9:31 PM      Result Value Range   Sodium 142  137 - 147 mEq/L   Potassium 3.9  3.7 - 5.3 mEq/L   Chloride 106  96 - 112 mEq/L   BUN 10  6 - 23 mg/dL   Creatinine, Ser 0.60  0.50  - 1.10 mg/dL   Glucose, Bld 89  70 - 99 mg/dL   Calcium, Ion 1.26 (*) 1.12 - 1.23 mmol/L   TCO2 22  0 - 100 mmol/L   Hemoglobin 15.6 (*) 12.0 - 15.0 g/dL   HCT 46.0  36.0 - 46.0 %   No results found.  Assessment and Plan: 28 y.o. female with ulcerative colitis flare. Plan to restart prednisone and mesalamine. Refer to [facility] gastroenterology and primary care provider.  Discussed warning signs or symptoms. Please see discharge instructions. Patient expresses understanding.    Gregor Hams, MD 12/30/13 2133

## 2013-12-30 NOTE — ED Notes (Addendum)
C/o cramping when she has a BM, bloody watery stools onset 1 week ago.  States it has been every.  States blood is bright red in the toilet. Thinks she could be anemic because she has lost a lot blood in the last week.  Having at least 5 stools/day.

## 2013-12-30 NOTE — Discharge Instructions (Signed)
Thank you for coming in today. Take mesalamine 2 pills daily Take prednisone daily for 20 days Followup with your primary care provider as soon as possible Go to the emergency room if you get worse If your belly pain worsens, or you have high fever, bad vomiting, blood in your stool or black tarry stool go to the Emergency Room.

## 2014-01-27 ENCOUNTER — Other Ambulatory Visit: Payer: Self-pay

## 2014-01-27 DIAGNOSIS — K5190 Ulcerative colitis, unspecified, without complications: Secondary | ICD-10-CM

## 2014-01-30 ENCOUNTER — Ambulatory Visit: Payer: Medicaid Other | Admitting: Gastroenterology

## 2014-02-06 ENCOUNTER — Encounter: Payer: Self-pay | Admitting: Gastroenterology

## 2014-02-06 ENCOUNTER — Ambulatory Visit (INDEPENDENT_AMBULATORY_CARE_PROVIDER_SITE_OTHER): Payer: Medicaid Other | Admitting: Gastroenterology

## 2014-02-06 VITALS — BP 102/70 | HR 72 | Ht 59.0 in | Wt 133.0 lb

## 2014-02-06 DIAGNOSIS — K5190 Ulcerative colitis, unspecified, without complications: Secondary | ICD-10-CM

## 2014-02-06 MED ORDER — ALPRAZOLAM 0.5 MG PO TABS: 0.5000 mg | ORAL_TABLET | Freq: Every evening | ORAL | Status: DC | PRN

## 2014-02-06 NOTE — Progress Notes (Signed)
          History of Present Illness:  The patient has returned following giving birth for reevaluation of ulcerative colitis.  In the last 2 months she's had a flareup consisting of at least 2 bloody bowel movements a day.  She has some mild cramping lower abdominal pain.  She was given steroids for about 2 weeks at which point she was improved.  She ran out of medications and has had none for the past month.  She remains on Lialda.    Review of Systems: Pertinent positive and negative review of systems were noted in the above HPI section. All other review of systems were otherwise negative.    Current Medications, Allergies, Past Medical History, Past Surgical History, Family History and Social History were reviewed in [facility] record  Vital signs were reviewed in today's medical record. Physical Exam: General: Well developed , well nourished, no acute distress   See Assessment and Plan under Problem List

## 2014-02-06 NOTE — Patient Instructions (Addendum)
Take Prednisone 10 mg four times a day  If feeling better reduce prednisone:  Reduce to 74m daily for 7 days Reduce to 258mfor 7 days Reduce to 1513m day for 7 days Reduce to 49m47mr 7 days Reduce to 49mg49mernating with 5 mg daily for 7 days Then take 5mg f78m7 days then discontinue  [facility]ax rx sent to [facility]

## 2014-02-06 NOTE — Assessment & Plan Note (Signed)
Has had a recent flare since giving birth over the past 2 months recon  Recommendations #1 begin prednisone 40 mg daily with slow taper weekly #2 continue lialda

## 2014-02-10 ENCOUNTER — Telehealth: Payer: Self-pay | Admitting: Gastroenterology

## 2014-02-10 MED ORDER — PREDNISONE 10 MG PO TABS: ORAL_TABLET | ORAL | Status: DC

## 2014-02-10 NOTE — Telephone Encounter (Signed)
Called pt to inform Prednisone was sent to [facility] on [address]  l/m

## 2014-02-12 ENCOUNTER — Telehealth: Payer: Self-pay | Admitting: Gastroenterology

## 2014-02-12 MED ORDER — MESALAMINE 1.2 G PO TBEC: 2.4000 g | DELAYED_RELEASE_TABLET | Freq: Every day | ORAL | Status: DC

## 2014-02-12 NOTE — Telephone Encounter (Signed)
Tried to call pt to inform med sent... No Answer

## 2014-02-13 NOTE — Telephone Encounter (Signed)
Script had printed, Faxed script in today

## 2014-02-18 ENCOUNTER — Telehealth: Payer: Self-pay | Admitting: Gastroenterology

## 2014-02-18 NOTE — Telephone Encounter (Signed)
Left samples for pt, pt aware

## 2014-03-16 ENCOUNTER — Telehealth: Payer: Self-pay | Admitting: Gastroenterology

## 2014-03-16 MED ORDER — PREDNISONE 10 MG PO TABS: ORAL_TABLET | ORAL | Status: DC

## 2014-03-16 NOTE — Telephone Encounter (Signed)
Pt was seen by Dr. Deatra Ina 02/06/14 and placed on a prednisone taper. Pt states she has been off of the prednisone for a while now and she has started to see some bright red blood with bowel movements again. Pt would like to have another script for prednisone. Please advise.

## 2014-03-16 NOTE — Telephone Encounter (Signed)
Again prednisone 20 mg daily.  Call back on Thursday

## 2014-03-16 NOTE — Telephone Encounter (Signed)
Pt aware and script sent to the pharmacy.

## 2014-04-01 ENCOUNTER — Telehealth: Payer: Self-pay | Admitting: Gastroenterology

## 2014-04-02 ENCOUNTER — Telehealth: Payer: Self-pay | Admitting: Gastroenterology

## 2014-04-02 MED ORDER — MESALAMINE 1.2 G PO TBEC: 2.4000 g | DELAYED_RELEASE_TABLET | Freq: Every day | ORAL | Status: DC

## 2014-04-02 MED ORDER — SULFASALAZINE 500 MG PO TABS: 1000.0000 mg | ORAL_TABLET | Freq: Two times a day (BID) | ORAL | Status: DC

## 2014-04-02 NOTE — Telephone Encounter (Signed)
Called patient to inform that new med was sent to pharmacy   She now stated she has Medicaid

## 2014-04-02 NOTE — Telephone Encounter (Signed)
Azulfidine 1gm bid

## 2014-04-02 NOTE — Telephone Encounter (Signed)
Called patient to inform Lialda sent to pharmacy

## 2014-04-02 NOTE — Telephone Encounter (Signed)
Dr Deatra Ina, Patient no longer has insurance  Cant afford Doristine Joyce Solomon has no insurance what can we give her

## 2014-04-19 ENCOUNTER — Emergency Department (HOSPITAL_COMMUNITY)
Admission: EM | Admit: 2014-04-19 | Discharge: 2014-04-19 | Disposition: A | Discharge status: Home / Self Care | Payer: Medicaid Other | Attending: Emergency Medicine | Admitting: Emergency Medicine

## 2014-04-19 ENCOUNTER — Encounter (HOSPITAL_COMMUNITY): Payer: Self-pay | Admitting: Emergency Medicine

## 2014-04-19 ENCOUNTER — Telehealth: Payer: Self-pay | Admitting: Internal Medicine

## 2014-04-19 DIAGNOSIS — Z87891 Personal history of nicotine dependence: Secondary | ICD-10-CM | POA: Insufficient documentation

## 2014-04-19 DIAGNOSIS — R197 Diarrhea, unspecified: Secondary | ICD-10-CM

## 2014-04-19 DIAGNOSIS — Z91040 Latex allergy status: Secondary | ICD-10-CM | POA: Insufficient documentation

## 2014-04-19 DIAGNOSIS — R109 Unspecified abdominal pain: Secondary | ICD-10-CM

## 2014-04-19 DIAGNOSIS — Z3202 Encounter for pregnancy test, result negative: Secondary | ICD-10-CM | POA: Insufficient documentation

## 2014-04-19 DIAGNOSIS — K5190 Ulcerative colitis, unspecified, without complications: Secondary | ICD-10-CM

## 2014-04-19 DIAGNOSIS — Z862 Personal history of diseases of the blood and blood-forming organs and certain disorders involving the immune mechanism: Secondary | ICD-10-CM | POA: Insufficient documentation

## 2014-04-19 DIAGNOSIS — Z79899 Other long term (current) drug therapy: Secondary | ICD-10-CM | POA: Insufficient documentation

## 2014-04-19 LAB — CBC WITH DIFFERENTIAL/PLATELET
BASOS ABS: 0 10*3/uL (ref 0.0–0.1)
Basophils Relative: 0 % (ref 0–1)
Eosinophils Absolute: 0.3 10*3/uL (ref 0.0–0.7)
Eosinophils Relative: 3 % (ref 0–5)
HCT: 39.8 % (ref 36.0–46.0)
Hemoglobin: 13.5 g/dL (ref 12.0–15.0)
LYMPHS ABS: 2.3 10*3/uL (ref 0.7–4.0)
LYMPHS PCT: 23 % (ref 12–46)
MCH: 30.5 pg (ref 26.0–34.0)
MCHC: 33.9 g/dL (ref 30.0–36.0)
MCV: 89.8 fL (ref 78.0–100.0)
Monocytes Absolute: 1.6 10*3/uL — ABNORMAL HIGH (ref 0.1–1.0)
Monocytes Relative: 16 % — ABNORMAL HIGH (ref 3–12)
NEUTROS ABS: 6 10*3/uL (ref 1.7–7.7)
NEUTROS PCT: 58 % (ref 43–77)
PLATELETS: 311 10*3/uL (ref 150–400)
RBC: 4.43 MIL/uL (ref 3.87–5.11)
RDW: 15.4 % (ref 11.5–15.5)
WBC: 10.3 10*3/uL (ref 4.0–10.5)

## 2014-04-19 LAB — COMPREHENSIVE METABOLIC PANEL
ALK PHOS: 66 U/L (ref 39–117)
ALT: 10 U/L (ref 0–35)
AST: 11 U/L (ref 0–37)
Albumin: 3.5 g/dL (ref 3.5–5.2)
BUN: 9 mg/dL (ref 6–23)
CALCIUM: 9.1 mg/dL (ref 8.4–10.5)
CO2: 23 meq/L (ref 19–32)
Chloride: 106 mEq/L (ref 96–112)
Creatinine, Ser: 0.55 mg/dL (ref 0.50–1.10)
GLUCOSE: 168 mg/dL — AB (ref 70–99)
POTASSIUM: 3.8 meq/L (ref 3.7–5.3)
SODIUM: 140 meq/L (ref 137–147)
Total Bilirubin: <0.2 mg/dL — ABNORMAL LOW (ref 0.3–1.2)
Total Protein: 7.1 g/dL (ref 6.0–8.3)

## 2014-04-19 LAB — POC URINE PREG, ED: Preg Test, Ur: NEGATIVE

## 2014-04-19 LAB — URINALYSIS, ROUTINE W REFLEX MICROSCOPIC
Bilirubin Urine: NEGATIVE
Glucose, UA: NEGATIVE mg/dL
HGB URINE DIPSTICK: NEGATIVE
Ketones, ur: NEGATIVE mg/dL
Leukocytes, UA: NEGATIVE
NITRITE: NEGATIVE
PH: 6 (ref 5.0–8.0)
Protein, ur: NEGATIVE mg/dL
SPECIFIC GRAVITY, URINE: 1.022 (ref 1.005–1.030)
UROBILINOGEN UA: 0.2 mg/dL (ref 0.0–1.0)

## 2014-04-19 MED ORDER — PREDNISONE 20 MG PO TABS: 40.0000 mg | ORAL_TABLET | Freq: Every day | ORAL | Status: DC

## 2014-04-19 MED ORDER — HYDROCODONE-ACETAMINOPHEN 5-325 MG PO TABS: 1.0000 | ORAL_TABLET | ORAL | Status: DC | PRN

## 2014-04-19 MED ORDER — FENTANYL CITRATE 0.05 MG/ML IJ SOLN
50.0000 ug | Freq: Once | INTRAMUSCULAR | Status: AC
  Administered 2014-04-19: 50 ug via INTRAVENOUS
  Filled 2014-04-19: qty 2

## 2014-04-19 MED ORDER — SODIUM CHLORIDE 0.9 % IV BOLUS (SEPSIS)
1000.0000 mL | Freq: Once | INTRAVENOUS | Status: AC
  Administered 2014-04-19: 1000 mL via INTRAVENOUS

## 2014-04-19 MED ORDER — ONDANSETRON HCL 4 MG/2ML IJ SOLN
4.0000 mg | Freq: Once | INTRAMUSCULAR | Status: AC
  Administered 2014-04-19: 4 mg via INTRAVENOUS
  Filled 2014-04-19: qty 2

## 2014-04-19 NOTE — Telephone Encounter (Signed)
Called by emergency room physician regarding this patient of Dr. Deatra Ina. Apparently has a history of ulcerative colitis. Presented to the ER with complaints of rectal bleeding. Negative workup in the ER. There are going to discharge her on 40 mg of prednisone daily. She needs a followup in our office this week. Have her see Dr. Deatra Ina or one of the extenders.

## 2014-04-19 NOTE — Discharge Instructions (Signed)
Abdominal Pain, Adult °Many things can cause abdominal pain. Usually, abdominal pain is not caused by a disease and will improve without treatment. It can often be observed and treated at home. Your health care provider will do a physical exam and possibly order blood tests and X-rays to help determine the seriousness of your pain. However, in many cases, more time must pass before a clear cause of the pain can be found. Before that point, your health care provider may not know if you need more testing or further treatment. °HOME CARE INSTRUCTIONS  °Monitor your abdominal pain for any changes. The following actions may help to alleviate any discomfort you are experiencing: °· Only take over-the-counter or prescription medicines as directed by your health care provider. °· Do not take laxatives unless directed to do so by your health care provider. °· Try a clear liquid diet (broth, tea, or water) as directed by your health care provider. Slowly move to a bland diet as tolerated. °SEEK MEDICAL CARE IF: °· You have unexplained abdominal pain. °· You have abdominal pain associated with nausea or diarrhea. °· You have pain when you urinate or have a bowel movement. °· You experience abdominal pain that wakes you in the night. °· You have abdominal pain that is worsened or improved by eating food. °· You have abdominal pain that is worsened with eating fatty foods. °SEEK IMMEDIATE MEDICAL CARE IF:  °· Your pain does not go away within 2 hours. °· You have a fever. °· You keep throwing up (vomiting). °· Your pain is felt only in portions of the abdomen, such as the right side or the left lower portion of the abdomen. °· You pass bloody or black tarry stools. °MAKE SURE YOU: °· Understand these instructions.   °· Will watch your condition.   °· Will get help right away if you are not doing well or get worse.   °Document Released: 08/16/2005 Document Revised: 08/27/2013 Document Reviewed: 07/16/2013 °Exit[employer]® Patient  Information ©2014 [employer]. ° °

## 2014-04-19 NOTE — ED Provider Notes (Signed)
CSN: [id number]     Arrival date & time 04/19/14  1356 History   First MD Initiated Contact with Patient 04/19/14 1500     Chief Complaint  Patient presents with  . Ulcerative Colitis  . Abdominal Pain  . Diarrhea     (Consider location/radiation/quality/duration/timing/severity/associated sxs/prior Treatment) HPI 28 year old female presents with 3 weeks of intermittent abdominal cramping as well as bloody diarrhea. She states this feels similar to her ulcerative colitis flares. She follows with Dr. Deatra Ina of [place]. She's not had any fevers or chills. She's had nausea without vomiting. Couple months ago she's been on prednisone after she delivered her baby for an ulcerative colitis flare. Since then she's not had any issues. She denies any urinary symptoms. She is a cramping mostly comes on when she is having a bowel movement. Aleve has mostly helped the pain. When she arrives states her pain was a 10 out of 10. She was given fentanyl prior to me seeing the patient she states her pain is completely resolved. She is currently on sulfasalazine for her UC.  Past Medical History  Diagnosis Date  . Anemia   . Chronic ulcerative colitis   . Headache(784.0)   . Blood transfusion without reported diagnosis     "year ago" b/c of anemia & UC   Past Surgical History  Procedure Laterality Date  . Colon biopsy    . Tonsillectomy    . Dilation and evacuation  08/21/2012    Procedure: DILATATION AND EVACUATION;  Surgeon: Logan Bores, MD;  Location: [facility];  Service: Gynecology;  Laterality: N/A;  . Cesarean section N/A 12/09/2013    Procedure: Primary Cesarean Section Delivery Baby Girl @ 1959, Apgars 8/8;  Surgeon: Logan Bores, MD;  Location: [facility];  Service: Obstetrics;  Laterality: N/A;   Family History  Problem Relation Age of Onset  . Anesthesia problems Neg Hx   . Hypotension Neg Hx   . Malignant hyperthermia Neg Hx   . Pseudochol deficiency Neg Hx   . Other Neg Hx   .  Alcohol abuse Neg Hx   . Arthritis Neg Hx   . Asthma Neg Hx   . Birth defects Neg Hx   . Cancer Neg Hx   . COPD Neg Hx   . Depression Neg Hx   . Diabetes Neg Hx   . Drug abuse Neg Hx   . Early death Neg Hx   . Hearing loss Neg Hx   . Heart disease Neg Hx   . Hyperlipidemia Neg Hx   . Hypertension Neg Hx   . Kidney disease Neg Hx   . Learning disabilities Neg Hx   . Mental illness Neg Hx   . Mental retardation Neg Hx   . Miscarriages / Stillbirths Neg Hx   . Stroke Neg Hx   . Vision loss Neg Hx    History  Substance Use Topics  . Smoking status: Former Smoker -- 3 years    Types: Cigarettes    Quit date: 05/16/2010  . Smokeless tobacco: Never Used  . Alcohol Use: No   OB History   Grav Para Term Preterm Abortions TAB SAB Ect Mult Living   3 2 2  1  1   2      Review of Systems  Constitutional: Negative for fever.  Gastrointestinal: Positive for nausea, abdominal pain, diarrhea and blood in stool. Negative for vomiting.  Genitourinary: Negative for dysuria and menstrual problem.  Musculoskeletal: Negative for back pain.  All other systems reviewed and are negative.     Allergies  Latex  Home Medications   Prior to Admission medications   Medication Sig Start Date End Date Taking? Authorizing Provider  sulfaSALAzine (AZULFIDINE) 500 MG tablet Take 2 tablets (1,000 mg total) by mouth 2 (two) times daily. 04/02/14  Yes Inda Castle, MD   BP 115/63  Pulse 105  Temp(Src) 98.8 F (37.1 C) (Oral)  Resp 20  SpO2 99%  Breastfeeding? No Physical Exam  Nursing note and vitals reviewed. Constitutional: She is oriented to person, place, and time. She appears well-developed and well-nourished. No distress.  HENT:  Head: Normocephalic and atraumatic.  Right Ear: External ear normal.  Left Ear: External ear normal.  Nose: Nose normal.  Eyes: Right eye exhibits no discharge. Left eye exhibits no discharge.  Cardiovascular: Normal rate, regular rhythm and normal  heart sounds.   Pulmonary/Chest: Effort normal and breath sounds normal.  Abdominal: Soft. She exhibits no distension. There is no tenderness.  Genitourinary:  Patient deferred rectal exam  Neurological: She is alert and oriented to person, place, and time.  Skin: Skin is warm and dry.    ED Course  Procedures (including critical care time) Labs Review Labs Reviewed  CBC WITH DIFFERENTIAL - Abnormal; Notable for the following:    Monocytes Relative 16 (*)    Monocytes Absolute 1.6 (*)    All other components within normal limits  COMPREHENSIVE METABOLIC PANEL - Abnormal; Notable for the following:    Glucose, Bld 168 (*)    Total Bilirubin <0.2 (*)    All other components within normal limits  URINALYSIS, ROUTINE W REFLEX MICROSCOPIC  POC OCCULT BLOOD, ED  POC URINE PREG, ED    Imaging Review No results found.   EKG Interpretation None      MDM   Final diagnoses:  Ulcerative colitis  Abdominal pain  Bloody diarrhea    Patient's abdominal exam is benign. She deferred a rectal exam. Her hemoglobin is stable at rest of her labs are benign. As she has no current pain, no fevers, or other concerning signs I feel this is most likely a mild ulcerative colitis flare. I discussed her case with Dr. Henrene Pastor, GI on call for Dr. Deatra Ina, who reviewed her chart and recommends giving her 40 mg prednisone for 7 days. She's to call their office tomorrow for an appointment within a week and they will taper her steroids from there. Patient understands this and will return if any of her symptoms worsen.    Ephraim Hamburger, MD 04/19/14 [phone]

## 2014-04-19 NOTE — ED Notes (Signed)
Patient having bright red blood in stool for last three weeks, along with abdominal pain, nausea but no vomiting.

## 2014-04-19 NOTE — ED Notes (Signed)
Pt reports UC flare x3 weeks with bloody diarrhea and generalized abd pain. +nausea, denies vomiting.

## 2014-04-19 NOTE — ED Notes (Signed)
MD at bedside. 

## 2014-04-20 ENCOUNTER — Telehealth: Payer: Self-pay | Admitting: Gastroenterology

## 2014-04-20 HISTORY — PX: COLONOSCOPY W/ BIOPSIES: SHX1374

## 2014-04-20 NOTE — Telephone Encounter (Signed)
Pt seen in ER for uc flare, discharged on prednisone 76m daily and per Dr. PHenrene Pastorto follow-up in  Office this week. Pt scheduled to see JAlonza BogusPA 04/23/14@1 :30pm. Pt aware of appt.

## 2014-04-23 ENCOUNTER — Ambulatory Visit (INDEPENDENT_AMBULATORY_CARE_PROVIDER_SITE_OTHER): Payer: Medicaid Other | Admitting: Gastroenterology

## 2014-04-23 ENCOUNTER — Encounter: Payer: Self-pay | Admitting: Gastroenterology

## 2014-04-23 ENCOUNTER — Telehealth: Payer: Self-pay | Admitting: Gastroenterology

## 2014-04-23 VITALS — BP 120/76 | HR 84 | Ht 59.0 in | Wt 135.2 lb

## 2014-04-23 DIAGNOSIS — K625 Hemorrhage of anus and rectum: Secondary | ICD-10-CM

## 2014-04-23 DIAGNOSIS — K5190 Ulcerative colitis, unspecified, without complications: Secondary | ICD-10-CM

## 2014-04-23 MED ORDER — NA SULFATE-K SULFATE-MG SULF 17.5-3.13-1.6 GM/177ML PO SOLN: ORAL | Status: DC

## 2014-04-23 MED ORDER — HYDROCODONE-ACETAMINOPHEN 5-325 MG PO TABS: 1.0000 | ORAL_TABLET | Freq: Four times a day (QID) | ORAL | Status: DC | PRN

## 2014-04-23 NOTE — Telephone Encounter (Signed)
I called Psychologist, occupational and per pharmacist prescription went through [facility] number per pharmacist was valid/good. Patient picked up prescription

## 2014-04-23 NOTE — Progress Notes (Signed)
Reviewed and agree with management.  If she in fact has a recurrent flare up of colitis she may need to step up therapy with a biologic or immunosuppressive. Sandy Salaam. Deatra Ina, M.D., [facility]

## 2014-04-23 NOTE — Patient Instructions (Addendum)
You have been scheduled for a colonoscopy with propofol. Please follow written instructions given to you at your visit today.  Please pick up your prep kit at the pharmacy within the next 1-3 days. If you use inhalers (even only as needed), please bring them with you on the day of your procedure. Your physician has requested that you go to www.startemmi.com and enter the access code given to you at your visit today. This web site gives a general overview about your procedure. However, you should still follow specific instructions given to you by our office regarding your preparation for the procedure.  We have given you a prescription for Norco/Vicodin 5-325 mg, please take one tablet by mouth every six hours as needed for pain.  

## 2014-04-23 NOTE — Progress Notes (Signed)
04/23/2014 Joyce Solomon [id number] 1986-09-18   History of Present Illness:  Patient is a 28 year old female who is known to Dr. Deatra Ina for treatment of her UC.  She apparently was diagnosed with this in 2007 and used to be followed by [facility].  Was first seen by Dr. Deatra Ina on 10/20/2013.  Was previously on Lialda, but recently was switched to Azulfidine 1 gram BID, which she is taking currently.  Her last colonoscopy was in 12/2010 by Dr. Penelope Coop at which time there was no evidence of active colitis.  She was last seen by Dr. Deatra Ina in March at which time she was having a Crohn's flare and demonstrated with a prednisone taper. She states that she only had a couple of weeks of symptomatic relief after coming off of the prednisone before developing symptoms again. She states that she's not been feeling well now for 4 weeks again. She reports that she's having 10 or so bowel movements a day with blood present in every stool.  She is waking up at night to have bowel movements. Complaining of lower abdominal pain. She is asking what she can take for her pain. She was in the ER this past weekend and they placed her on 40 mg of prednisone, but only gave her enough medication for a couple of days and told her to follow up here for the remaining doses.  Just of note, she is not a great historian when describing her symptoms and the course of her illness.   Current Medications, Allergies, Past Medical History, Past Surgical History, Family History and Social History were reviewed in [employer] record.   Physical Exam: BP 120/76  Pulse 84  Ht 4' 11"  (1.499 m)  Wt 135 lb 3.2 oz (61.326 kg)  BMI 27.29 kg/m2 General: Well developed black female in no acute distress Head: Normocephalic and atraumatic Eyes:  Sclerae anicteric, conjunctiva pink  Ears: Normal auditory acuity Lungs: Clear throughout to auscultation Heart: Regular rate and rhythm Abdomen: Soft, non-distended.   Normal bowel sounds.  Mild diffuse TTP without R/R/G. Rectal:  Deferred.  Will be done at the time of colonoscopy.   Musculoskeletal: Symmetrical with no gross deformities  Extremities: No edema  Neurological: Alert oriented x 4, grossly non-focal Psychological:  Alert and cooperative. Normal mood and affect  Assessment and Recommendations:      -Ulcerative colitis:  Having flare quite consistently since first seen by Dr. Deatra Ina in 10/2013 when she was 8 months pregnant.  Was on prednisone March-April with only a couple weeks of symptom relief before becoming symptomatic again.  Patient asking for repeat colonoscopy to assess disease activity.  Will continue azulfidine 1 gm twice daily for now, but will schedule colonoscopy for tomorrow, 6/4.  I agreed to give her 10 Vicodin pills(she said that ultram never worked for her in the past) to get her through her colonoscopy, but advised her that we do not prescribe pain medication regularly and the goal is to treat her underlying problem.

## 2014-04-24 ENCOUNTER — Encounter: Payer: Self-pay | Admitting: Gastroenterology

## 2014-04-24 ENCOUNTER — Ambulatory Visit (AMBULATORY_SURGERY_CENTER): Payer: Medicaid Other | Admitting: Gastroenterology

## 2014-04-24 VITALS — BP 145/101 | HR 88 | Temp 98.2°F | Resp 16 | Ht 59.0 in | Wt 135.0 lb

## 2014-04-24 DIAGNOSIS — K5190 Ulcerative colitis, unspecified, without complications: Secondary | ICD-10-CM

## 2014-04-24 DIAGNOSIS — K5289 Other specified noninfective gastroenteritis and colitis: Secondary | ICD-10-CM

## 2014-04-24 DIAGNOSIS — K625 Hemorrhage of anus and rectum: Secondary | ICD-10-CM

## 2014-04-24 MED ORDER — MESALAMINE 4 G RE ENEM: ENEMA | RECTAL | Status: DC

## 2014-04-24 MED ORDER — PREDNISONE 10 MG PO TABS: ORAL_TABLET | ORAL | Status: DC

## 2014-04-24 MED ORDER — SODIUM CHLORIDE 0.9 % IV SOLN: 500.0000 mL | INTRAVENOUS | Status: DC

## 2014-04-24 MED ORDER — MESALAMINE 1.2 G PO TBEC: 4.8000 g | DELAYED_RELEASE_TABLET | Freq: Every day | ORAL | Status: DC

## 2014-04-24 NOTE — Patient Instructions (Addendum)
YOU HAD AN ENDOSCOPIC PROCEDURE TODAY AT [facility]: Refer to the procedure report that was given to you for any specific questions about what was found during the examination.  If the procedure report does not answer your questions, please call your gastroenterologist to clarify.  If you requested that your care partner not be given the details of your procedure findings, then the procedure report has been included in a sealed envelope for you to review at your convenience later.  YOU SHOULD EXPECT: Some feelings of bloating in the abdomen. Passage of more gas than usual.  Walking can help get rid of the air that was put into your GI tract during the procedure and reduce the bloating. If you had a lower endoscopy (such as a colonoscopy or flexible sigmoidoscopy) you may notice spotting of blood in your stool or on the toilet paper. If you underwent a bowel prep for your procedure, then you may not have a normal bowel movement for a few days.  DIET: Your first meal following the procedure should be a light meal and then it is ok to progress to your normal diet.  A half-sandwich or bowl of soup is an example of a good first meal.  Heavy or fried foods are harder to digest and may make you feel nauseous or bloated.  Likewise meals heavy in dairy and vegetables can cause extra gas to form and this can also increase the bloating.  Drink plenty of fluids but you should avoid alcoholic beverages for 24 hours.  ACTIVITY: Your care partner should take you home directly after the procedure.  You should plan to take it easy, moving slowly for the rest of the day.  You can resume normal activity the day after the procedure however you should NOT DRIVE or use heavy machinery for 24 hours (because of the sedation medicines used during the test).    SYMPTOMS TO REPORT IMMEDIATELY: A gastroenterologist can be reached at any hour.  During normal business hours, 8:30 AM to 5:00 PM Monday through Friday,  call [phone].  After hours and on weekends, please call the GI answering service at [phone] who will take a message and have the physician on call contact you.   Following lower endoscopy (colonoscopy or flexible sigmoidoscopy):  Excessive amounts of blood in the stool  Significant tenderness or worsening of abdominal pains  Swelling of the abdomen that is new, acute  Fever of 100F or higher  FOLLOW UP: If any biopsies were taken you will be contacted by phone or by letter within the next 1-3 weeks.  Call your gastroenterologist if you have not heard about the biopsies in 3 weeks.  Our staff will call the home number listed on your records the next business day following your procedure to check on you and address any questions or concerns that you may have at that time regarding the information given to you following your procedure. This is a courtesy call and so if there is no answer at the home number and we have not heard from you through the emergency physician on call, we will assume that you have returned to your regular daily activities without incident.  SIGNATURES/CONFIDENTIALITY: You and/or your care partner have signed paperwork which will be entered into your electronic medical record.  These signatures attest to the fact that that the information above on your After Visit Summary has been reviewed and is understood.  Full responsibility of the confidentiality of this  discharge information lies with you and/or your care-partner.  Ulcerative colitis, diverticulosis, low fiber diet-handouts given.  Switch from Azulfidine to liada 4.8 g daily  Prednisone 40 mg daily  Lower fiber diet.  Office visit in 2-3 weeks, office will call with appointment, if office has not called by Tues 6/9 please call office.  Please call after taking prednisone for 1 week and let Dr Deatra Ina know how things are going.

## 2014-04-24 NOTE — Progress Notes (Signed)
Called to room to assist during endoscopic procedure.  Patient ID and intended procedure confirmed with present staff. Received instructions for my participation in the procedure from the performing physician.  

## 2014-04-24 NOTE — Op Note (Signed)
[place]  [address] [place], [id number]   COLONOSCOPY PROCEDURE REPORT  PATIENT: Joyce Solomon, Joyce Solomon  MR#: [id number] BIRTHDATE: [birth year] , 27  yrs. old GENDER: Female ENDOSCOPIST: Inda Castle, MD REFERRED BY: PROCEDURE DATE:  04/24/2014 PROCEDURE:   Colonoscopy with biopsy First Screening Colonoscopy - Avg.  risk and is 50 yrs.  old or older - No.  Prior Negative Screening - Now for repeat screening. N/A  History of Adenoma - Now for follow-up colonoscopy & has been > or = to 3 yrs.  N/A  Polyps Removed Today? No.  Recommend repeat exam, <10 yrs? No. ASA CLASS:   Class II INDICATIONS:follow up for previously diagnosed ulcerative colitis and Chronic diarrhea. MEDICATIONS: MAC sedation, administered by CRNA and propofol (Diprivan) 323m IV  DESCRIPTION OF PROCEDURE:   After the risks benefits and alternatives of the procedure were thoroughly explained, informed consent was obtained.  A digital rectal exam revealed no abnormalities of the rectum.   The LB [id number] endoscope was introduced through the anus and advanced to the cecum, which was identified by both the appendix and ileocecal valve. No adverse events experienced.   The quality of the prep was Suprep good  The instrument was then slowly withdrawn as the colon was fully examined.      COLON FINDINGS: Throughout the left colon, beginning in the rectal vault and extending to the very proximal descending colon, there were inflammatory changes consisting of mucosal edema and erythema. There was excess mucus.  Inflammatory changes were more severe in the distal colon.  Biopsies were taken throughout the left colon. These changes stopped probably in the area of the splenic flexure. The colonic because the proximal to the splenic flexure was normal. Mild diverticulosis was noted in the ascending colon. Retroflexed views revealed no abnormalities. The time to cecum=2 minutes 59  seconds.  Withdrawal time=8 minutes 09 seconds.  The scope was withdrawn and the procedure completed. COMPLICATIONS: There were no complications.  ENDOSCOPIC IMPRESSION: 1.  active left-sided colitis 2.  diverticulosis  RECOMMENDATIONS: 1.  switch from Azulfidine to lialda 4.8 g daily 2.  begin Rowasa enemas 3.  prednisone 40 mg daily 4.  low fiber diet  eSigned:  RInda Castle MD 04/24/2014 4:11 PM   cc:  NCelso Sickle MD   PATIENT NAME:  Joyce Solomon, GlausMR#: [id number]

## 2014-04-24 NOTE — Progress Notes (Signed)
A/ox3, pleased with MAC, report to RN 

## 2014-04-27 ENCOUNTER — Telehealth: Payer: Self-pay

## 2014-04-27 NOTE — Telephone Encounter (Signed)
Left a message on the pt's answering machine at [phone] for the pt to call back if any questions or concerns. Maw

## 2014-04-29 ENCOUNTER — Encounter: Payer: Self-pay | Admitting: Gastroenterology

## 2014-05-27 ENCOUNTER — Telehealth: Payer: Self-pay | Admitting: Gastroenterology

## 2014-05-27 NOTE — Telephone Encounter (Signed)
Samples out front for pt

## 2014-06-02 NOTE — Telephone Encounter (Signed)
Have not received form from [facility] stated they would fax over denial today

## 2014-06-03 MED ORDER — MESALAMINE 1.2 G PO TBEC: 4.8000 g | DELAYED_RELEASE_TABLET | Freq: Every day | ORAL | Status: DC

## 2014-06-03 NOTE — Telephone Encounter (Signed)
Lialda approved  [id number] approval number

## 2014-06-15 ENCOUNTER — Telehealth: Payer: Self-pay | Admitting: Gastroenterology

## 2014-06-16 MED ORDER — SULFASALAZINE 500 MG PO TABS: 1000.0000 mg | ORAL_TABLET | Freq: Two times a day (BID) | ORAL | Status: DC

## 2014-06-16 NOTE — Telephone Encounter (Signed)
Dr Deatra Ina, patient states she  Cant afford Lialda, Is there a substitute we can give her

## 2014-06-16 NOTE — Telephone Encounter (Signed)
Lm for pt

## 2014-06-16 NOTE — Telephone Encounter (Signed)
New med sent to pharmacy   Patient aware

## 2014-06-16 NOTE — Telephone Encounter (Signed)
She can try Azulfidine 1 g twice a day

## 2014-06-18 ENCOUNTER — Telehealth: Payer: Self-pay | Admitting: Gastroenterology

## 2014-06-18 MED ORDER — PREDNISONE 10 MG PO TABS: ORAL_TABLET | ORAL | Status: DC

## 2014-06-18 NOTE — Telephone Encounter (Signed)
Med sent to pharmacy.

## 2014-08-17 ENCOUNTER — Emergency Department (HOSPITAL_COMMUNITY)
Admission: EM | Admit: 2014-08-17 | Discharge: 2014-08-17 | Disposition: A | Discharge status: Home / Self Care | Payer: Medicaid Other | Attending: Emergency Medicine | Admitting: Emergency Medicine

## 2014-08-17 ENCOUNTER — Encounter (HOSPITAL_COMMUNITY): Payer: Self-pay | Admitting: Emergency Medicine

## 2014-08-17 DIAGNOSIS — Z8709 Personal history of other diseases of the respiratory system: Secondary | ICD-10-CM | POA: Diagnosis not present

## 2014-08-17 DIAGNOSIS — R110 Nausea: Secondary | ICD-10-CM | POA: Insufficient documentation

## 2014-08-17 DIAGNOSIS — Z8719 Personal history of other diseases of the digestive system: Secondary | ICD-10-CM | POA: Insufficient documentation

## 2014-08-17 DIAGNOSIS — R1032 Left lower quadrant pain: Secondary | ICD-10-CM | POA: Diagnosis not present

## 2014-08-17 DIAGNOSIS — Z91040 Latex allergy status: Secondary | ICD-10-CM | POA: Diagnosis not present

## 2014-08-17 DIAGNOSIS — Z87891 Personal history of nicotine dependence: Secondary | ICD-10-CM | POA: Diagnosis not present

## 2014-08-17 DIAGNOSIS — R1012 Left upper quadrant pain: Secondary | ICD-10-CM | POA: Diagnosis not present

## 2014-08-17 DIAGNOSIS — Z862 Personal history of diseases of the blood and blood-forming organs and certain disorders involving the immune mechanism: Secondary | ICD-10-CM | POA: Insufficient documentation

## 2014-08-17 DIAGNOSIS — Z3202 Encounter for pregnancy test, result negative: Secondary | ICD-10-CM | POA: Insufficient documentation

## 2014-08-17 DIAGNOSIS — R197 Diarrhea, unspecified: Secondary | ICD-10-CM | POA: Insufficient documentation

## 2014-08-17 DIAGNOSIS — Z79899 Other long term (current) drug therapy: Secondary | ICD-10-CM | POA: Diagnosis not present

## 2014-08-17 LAB — URINALYSIS, ROUTINE W REFLEX MICROSCOPIC
BILIRUBIN URINE: NEGATIVE
Glucose, UA: NEGATIVE mg/dL
Hgb urine dipstick: NEGATIVE
KETONES UR: NEGATIVE mg/dL
LEUKOCYTES UA: NEGATIVE
NITRITE: NEGATIVE
PROTEIN: NEGATIVE mg/dL
Specific Gravity, Urine: 1.027 (ref 1.005–1.030)
Urobilinogen, UA: 0.2 mg/dL (ref 0.0–1.0)
pH: 6 (ref 5.0–8.0)

## 2014-08-17 LAB — PREGNANCY, URINE: Preg Test, Ur: NEGATIVE

## 2014-08-17 MED ORDER — ONDANSETRON 4 MG PO TBDP: ORAL_TABLET | ORAL | Status: DC

## 2014-08-17 MED ORDER — DICYCLOMINE HCL 10 MG PO CAPS
10.0000 mg | ORAL_CAPSULE | Freq: Once | ORAL | Status: AC
  Administered 2014-08-17: 10 mg via ORAL
  Filled 2014-08-17: qty 1

## 2014-08-17 MED ORDER — ONDANSETRON 4 MG PO TBDP
4.0000 mg | ORAL_TABLET | Freq: Once | ORAL | Status: AC
  Administered 2014-08-17: 4 mg via ORAL
  Filled 2014-08-17: qty 1

## 2014-08-17 MED ORDER — DICYCLOMINE HCL 20 MG PO TABS: 20.0000 mg | ORAL_TABLET | Freq: Two times a day (BID) | ORAL | Status: DC

## 2014-08-17 NOTE — ED Provider Notes (Signed)
CSN: [id number]     Arrival date & time 08/17/14  5726 History   First MD Initiated Contact with Patient 08/17/14 [phone]     Chief Complaint  Patient presents with  . Diarrhea    x1 this am     (Consider location/radiation/quality/duration/timing/severity/associated sxs/prior Treatment) HPI Comments: Patient presents with diarrhea and abdominal cramping. She states she woke up this morning with abdominal cramping. She had one episode of diarrhea that was watery and nonbloody. She is having nausea but no episodes of vomiting. She denies any fevers. She has a history of ulcerative colitis but says this did not feel like her ulcerative colitis. She states it feels like when she's had a stomach virus. She denies any urinary symptoms. She denies any cough or cold symptoms.  Patient is a 28 y.o. female presenting with diarrhea.  Diarrhea Associated symptoms: abdominal pain   Associated symptoms: no arthralgias, no chills, no diaphoresis, no fever, no headaches and no vomiting     Past Medical History  Diagnosis Date  . Anemia   . Chronic ulcerative colitis   . Headache(784.0)   . Blood transfusion without reported diagnosis     "year ago" b/c of anemia & UC  . Allergy     SEASONAL   Past Surgical History  Procedure Laterality Date  . Colon biopsy    . Tonsillectomy    . Dilation and evacuation  08/21/2012    Procedure: DILATATION AND EVACUATION;  Surgeon: Logan Bores, MD;  Location: [facility];  Service: Gynecology;  Laterality: N/A;  . Cesarean section N/A 12/09/2013    Procedure: Primary Cesarean Section Delivery Baby Girl @ 1959, Apgars 8/8;  Surgeon: Logan Bores, MD;  Location: [facility];  Service: Obstetrics;  Laterality: N/A;  . Colonoscopy  01/19/2011   Family History  Problem Relation Age of Onset  . Anesthesia problems Neg Hx   . Hypotension Neg Hx   . Malignant hyperthermia Neg Hx   . Pseudochol deficiency Neg Hx   . Other Neg Hx   . Alcohol abuse Neg Hx   .  Arthritis Neg Hx   . Asthma Neg Hx   . Birth defects Neg Hx   . Cancer Neg Hx   . COPD Neg Hx   . Depression Neg Hx   . Diabetes Neg Hx   . Drug abuse Neg Hx   . Early death Neg Hx   . Hearing loss Neg Hx   . Heart disease Neg Hx   . Hyperlipidemia Neg Hx   . Hypertension Neg Hx   . Kidney disease Neg Hx   . Learning disabilities Neg Hx   . Mental illness Neg Hx   . Mental retardation Neg Hx   . Miscarriages / Stillbirths Neg Hx   . Stroke Neg Hx   . Vision loss Neg Hx    History  Substance Use Topics  . Smoking status: Former Smoker -- 3 years    Types: Cigarettes    Quit date: 05/16/2010  . Smokeless tobacco: Never Used  . Alcohol Use: No   OB History   Grav Para Term Preterm Abortions TAB SAB Ect Mult Living   3 2 2  1  1   2      Review of Systems  Constitutional: Negative for fever, chills, diaphoresis and fatigue.  HENT: Negative for congestion, rhinorrhea and sneezing.   Eyes: Negative.   Respiratory: Negative for cough, chest tightness and shortness of breath.   Cardiovascular:  Negative for chest pain and leg swelling.  Gastrointestinal: Positive for nausea, abdominal pain and diarrhea. Negative for vomiting and blood in stool.  Genitourinary: Negative for frequency, hematuria, flank pain and difficulty urinating.  Musculoskeletal: Negative for arthralgias and back pain.  Skin: Negative for rash.  Neurological: Negative for dizziness, speech difficulty, weakness, numbness and headaches.      Allergies  Latex  Home Medications   Prior to Admission medications   Medication Sig Start Date End Date Taking? Authorizing Provider  mesalamine (LIALDA) 1.2 G EC tablet Take 4 tablets (4.8 g total) by mouth daily with breakfast. 06/03/14  Yes Inda Castle, MD  sulfaSALAzine (AZULFIDINE) 500 MG tablet Take 2 tablets (1,000 mg total) by mouth 2 (two) times daily. 06/16/14  Yes Inda Castle, MD  dicyclomine (BENTYL) 20 MG tablet Take 1 tablet (20 mg total) by  mouth 2 (two) times daily. 08/17/14   Malvin Johns, MD  ondansetron (ZOFRAN ODT) 4 MG disintegrating tablet 54m ODT q4 hours prn nausea/vomit 08/17/14   MMalvin Johns MD   BP 114/88  Pulse 81  Temp(Src) 98 F (36.7 C) (Oral)  Resp 20  Ht 4' 11"  (1.499 m)  Wt 130 lb (58.968 kg)  BMI 26.24 kg/m2  SpO2 100% Physical Exam  Constitutional: She is oriented to person, place, and time. She appears well-developed and well-nourished.  HENT:  Head: Normocephalic and atraumatic.  Eyes: Pupils are equal, round, and reactive to light.  Neck: Normal range of motion. Neck supple.  Cardiovascular: Normal rate, regular rhythm and normal heart sounds.   Pulmonary/Chest: Effort normal and breath sounds normal. No respiratory distress. She has no wheezes. She has no rales. She exhibits no tenderness.  Abdominal: Soft. Bowel sounds are normal. There is tenderness (mild tenderness in the left upper and lower quadrant). There is no rebound and no guarding.  Musculoskeletal: Normal range of motion. She exhibits no edema.  Lymphadenopathy:    She has no cervical adenopathy.  Neurological: She is alert and oriented to person, place, and time.  Skin: Skin is warm and dry. No rash noted.  Psychiatric: She has a normal mood and affect.    ED Course  Procedures (including critical care time) Labs Review Labs Reviewed  URINALYSIS, ROUTINE W REFLEX MICROSCOPIC  PREGNANCY, URINE    Imaging Review No results found.   EKG Interpretation None      MDM   Final diagnoses:  Diarrhea    Patient was given fentanyl and Zofran in the ED. Her symptoms improved. She is no tenderness on abdominal exam. She's had one episode of diarrhea and no vomiting. Don't feel that any lab work or further imaging studies as indicated. Her symptoms do not sound consistent with an ulcerative colitis flare. I feel this is likely a viral illness. She was given prescriptions for Bentyl and Zofran. She was advised bland diet.  She  was encouraged to followup with her gastroenterologist if her symptoms continue.    MMalvin Johns MD 08/17/14 [phone]

## 2014-08-17 NOTE — ED Notes (Signed)
Patient states she woke up this am with abd cramping, diarrhea, and was "sweating". Patient states symptoms started at 0500. Patient states she has had one episode of diarrhea this am. Patient denies taking any medications for her symptoms.

## 2014-08-17 NOTE — Discharge Instructions (Signed)

## 2014-08-20 ENCOUNTER — Telehealth: Payer: Self-pay | Admitting: Gastroenterology

## 2014-08-20 NOTE — Telephone Encounter (Signed)
Patient was seen in the ER 08/17/14 for abdominal pain. Patient states she thinks it was viral but feels at this time she has a flare of her UC. She has done well up to this point. Reporting 3 diarrhea stools today with streaks of blood in it and an urgency prior to the stool. She is a little sore in her abdomen but denies sharp or severe pain. No vomiting. No fever. Patient would like to try prednisone for this.Please advise.

## 2014-08-21 ENCOUNTER — Other Ambulatory Visit: Payer: Self-pay

## 2014-08-21 DIAGNOSIS — K51911 Ulcerative colitis, unspecified with rectal bleeding: Secondary | ICD-10-CM

## 2014-08-21 MED ORDER — HYDROCORTISONE ACE-PRAMOXINE 1-1 % RE FOAM: 1.0000 | Freq: Two times a day (BID) | RECTAL | Status: DC

## 2014-08-21 MED ORDER — PREDNISONE 20 MG PO TABS: 40.0000 mg | ORAL_TABLET | Freq: Every day | ORAL | Status: DC

## 2014-08-21 NOTE — Telephone Encounter (Signed)
Spoke with the patient. She agrees to the treatment plan. Appointment 08/25/14 at 3:15

## 2014-08-21 NOTE — Telephone Encounter (Signed)
OK to begin pred 48m qd, and proctofoam 1 qhs She needs an OV next 2 weeks.  She will need biologics

## 2014-08-21 NOTE — Telephone Encounter (Signed)
I have left message for the patient to call back

## 2014-08-25 ENCOUNTER — Ambulatory Visit: Payer: Medicaid Other | Admitting: Gastroenterology

## 2014-09-21 ENCOUNTER — Encounter (HOSPITAL_COMMUNITY): Payer: Self-pay | Admitting: Emergency Medicine

## 2014-10-01 ENCOUNTER — Telehealth: Payer: Self-pay | Admitting: Gastroenterology

## 2014-10-01 MED ORDER — SULFASALAZINE 500 MG PO TABS: 1000.0000 mg | ORAL_TABLET | Freq: Two times a day (BID) | ORAL | Status: DC

## 2014-10-01 NOTE — Telephone Encounter (Signed)
Med sent to pharmacy.

## 2014-10-02 ENCOUNTER — Emergency Department (HOSPITAL_COMMUNITY)
Admission: EM | Admit: 2014-10-02 | Discharge: 2014-10-02 | Discharge status: Against Medical Advice | Payer: Medicaid Other | Source: Home / Self Care

## 2014-10-02 ENCOUNTER — Emergency Department (HOSPITAL_COMMUNITY): Payer: Medicaid Other

## 2014-10-02 ENCOUNTER — Encounter (HOSPITAL_COMMUNITY): Payer: Self-pay | Admitting: *Deleted

## 2014-10-02 ENCOUNTER — Emergency Department (HOSPITAL_COMMUNITY)
Admission: EM | Admit: 2014-10-02 | Discharge: 2014-10-02 | Disposition: A | Discharge status: Home / Self Care | Payer: Medicaid Other | Attending: Emergency Medicine | Admitting: Emergency Medicine

## 2014-10-02 DIAGNOSIS — Z79899 Other long term (current) drug therapy: Secondary | ICD-10-CM | POA: Diagnosis not present

## 2014-10-02 DIAGNOSIS — Z87891 Personal history of nicotine dependence: Secondary | ICD-10-CM

## 2014-10-02 DIAGNOSIS — K51911 Ulcerative colitis, unspecified with rectal bleeding: Secondary | ICD-10-CM

## 2014-10-02 DIAGNOSIS — R109 Unspecified abdominal pain: Secondary | ICD-10-CM

## 2014-10-02 DIAGNOSIS — R112 Nausea with vomiting, unspecified: Secondary | ICD-10-CM

## 2014-10-02 DIAGNOSIS — Z91040 Latex allergy status: Secondary | ICD-10-CM | POA: Insufficient documentation

## 2014-10-02 DIAGNOSIS — Z8709 Personal history of other diseases of the respiratory system: Secondary | ICD-10-CM | POA: Insufficient documentation

## 2014-10-02 DIAGNOSIS — Z862 Personal history of diseases of the blood and blood-forming organs and certain disorders involving the immune mechanism: Secondary | ICD-10-CM | POA: Diagnosis not present

## 2014-10-02 DIAGNOSIS — K625 Hemorrhage of anus and rectum: Secondary | ICD-10-CM | POA: Insufficient documentation

## 2014-10-02 DIAGNOSIS — K529 Noninfective gastroenteritis and colitis, unspecified: Secondary | ICD-10-CM | POA: Insufficient documentation

## 2014-10-02 DIAGNOSIS — Z3202 Encounter for pregnancy test, result negative: Secondary | ICD-10-CM | POA: Insufficient documentation

## 2014-10-02 LAB — CBC WITH DIFFERENTIAL/PLATELET
BASOS PCT: 0 % (ref 0–1)
Basophils Absolute: 0 10*3/uL (ref 0.0–0.1)
EOS PCT: 1 % (ref 0–5)
Eosinophils Absolute: 0.2 10*3/uL (ref 0.0–0.7)
HCT: 43.1 % (ref 36.0–46.0)
Hemoglobin: 14.7 g/dL (ref 12.0–15.0)
Lymphocytes Relative: 13 % (ref 12–46)
Lymphs Abs: 1.8 10*3/uL (ref 0.7–4.0)
MCH: 31.6 pg (ref 26.0–34.0)
MCHC: 34.1 g/dL (ref 30.0–36.0)
MCV: 92.7 fL (ref 78.0–100.0)
MONOS PCT: 14 % — AB (ref 3–12)
Monocytes Absolute: 2 10*3/uL — ABNORMAL HIGH (ref 0.1–1.0)
NEUTROS PCT: 72 % (ref 43–77)
Neutro Abs: 10.1 10*3/uL — ABNORMAL HIGH (ref 1.7–7.7)
Platelets: 329 10*3/uL (ref 150–400)
RBC: 4.65 MIL/uL (ref 3.87–5.11)
RDW: 14.2 % (ref 11.5–15.5)
WBC: 14 10*3/uL — ABNORMAL HIGH (ref 4.0–10.5)

## 2014-10-02 LAB — COMPREHENSIVE METABOLIC PANEL
ALBUMIN: 3.8 g/dL (ref 3.5–5.2)
ALK PHOS: 81 U/L (ref 39–117)
ALT: 8 U/L (ref 0–35)
ANION GAP: 14 (ref 5–15)
AST: 11 U/L (ref 0–37)
BUN: 6 mg/dL (ref 6–23)
CALCIUM: 9.3 mg/dL (ref 8.4–10.5)
CO2: 22 mEq/L (ref 19–32)
CREATININE: 0.55 mg/dL (ref 0.50–1.10)
Chloride: 102 mEq/L (ref 96–112)
GFR calc non Af Amer: >90 mL/min (ref >90)
GLUCOSE: 123 mg/dL — AB (ref 70–99)
POTASSIUM: 3.6 meq/L — AB (ref 3.7–5.3)
Sodium: 138 mEq/L (ref 137–147)
TOTAL PROTEIN: 7.6 g/dL (ref 6.0–8.3)
Total Bilirubin: <0.2 mg/dL — ABNORMAL LOW (ref 0.3–1.2)

## 2014-10-02 LAB — URINALYSIS, ROUTINE W REFLEX MICROSCOPIC
Bilirubin Urine: NEGATIVE
Glucose, UA: NEGATIVE mg/dL
HGB URINE DIPSTICK: NEGATIVE
Ketones, ur: 15 mg/dL — AB
LEUKOCYTES UA: NEGATIVE
NITRITE: NEGATIVE
PROTEIN: NEGATIVE mg/dL
SPECIFIC GRAVITY, URINE: 1.024 (ref 1.005–1.030)
UROBILINOGEN UA: 0.2 mg/dL (ref 0.0–1.0)
pH: 6 (ref 5.0–8.0)

## 2014-10-02 LAB — I-STAT BETA HCG BLOOD, ED (MC, WL, AP ONLY)

## 2014-10-02 LAB — POC URINE PREG, ED: PREG TEST UR: NEGATIVE

## 2014-10-02 LAB — LIPASE, BLOOD: LIPASE: 32 U/L (ref 11–59)

## 2014-10-02 MED ORDER — HYDROMORPHONE HCL 1 MG/ML IJ SOLN
1.0000 mg | Freq: Once | INTRAMUSCULAR | Status: AC
  Administered 2014-10-02: 1 mg via INTRAVENOUS
  Filled 2014-10-02: qty 1

## 2014-10-02 MED ORDER — IOHEXOL 300 MG/ML  SOLN
100.0000 mL | Freq: Once | INTRAMUSCULAR | Status: AC | PRN
  Administered 2014-10-02: 100 mL via INTRAVENOUS

## 2014-10-02 MED ORDER — SODIUM CHLORIDE 0.9 % IV BOLUS (SEPSIS)
1000.0000 mL | Freq: Once | INTRAVENOUS | Status: AC
  Administered 2014-10-02: 1000 mL via INTRAVENOUS

## 2014-10-02 MED ORDER — PREDNISONE 20 MG PO TABS: 40.0000 mg | ORAL_TABLET | Freq: Every day | ORAL | Status: DC

## 2014-10-02 MED ORDER — ONDANSETRON HCL 4 MG/2ML IJ SOLN
4.0000 mg | Freq: Once | INTRAMUSCULAR | Status: AC
  Administered 2014-10-02: 4 mg via INTRAVENOUS
  Filled 2014-10-02: qty 2

## 2014-10-02 MED ORDER — ONDANSETRON 4 MG PO TBDP
8.0000 mg | ORAL_TABLET | Freq: Once | ORAL | Status: AC
  Administered 2014-10-02: 8 mg via ORAL
  Filled 2014-10-02: qty 2

## 2014-10-02 MED ORDER — HYDROCODONE-ACETAMINOPHEN 5-325 MG PO TABS: 1.0000 | ORAL_TABLET | ORAL | Status: DC | PRN

## 2014-10-02 MED ORDER — HYDROCORTISONE ACE-PRAMOXINE 1-1 % RE FOAM: 1.0000 | Freq: Two times a day (BID) | RECTAL | Status: DC

## 2014-10-02 MED ORDER — IOHEXOL 300 MG/ML  SOLN
50.0000 mL | Freq: Once | INTRAMUSCULAR | Status: AC | PRN
  Administered 2014-10-02: 50 mL via ORAL

## 2014-10-02 MED ORDER — MORPHINE SULFATE 4 MG/ML IJ SOLN
4.0000 mg | Freq: Once | INTRAMUSCULAR | Status: AC
Start: 2014-10-02 — End: 2014-10-02
  Administered 2014-10-02: 4 mg via INTRAVENOUS
  Filled 2014-10-02: qty 1

## 2014-10-02 NOTE — ED Notes (Signed)
Pt still unable to void

## 2014-10-02 NOTE — ED Notes (Signed)
MD at bedside. 

## 2014-10-02 NOTE — Discharge Instructions (Signed)
Follow up closely with GI.  If you were given medicines take as directed.  If you are on coumadin or contraceptives realize their levels and effectiveness is altered by many different medicines.  If you have any reaction (rash, tongues swelling, other) to the medicines stop taking and see a physician.   Please follow up as directed and return to the ER or see a physician for new or worsening symptoms.  Thank you. Filed Vitals:   10/02/14 0516 10/02/14 0750 10/02/14 0950 10/02/14 1202  BP: 141/91 151/85 136/76 139/71  Pulse: 84 81 73 76  Temp: 98.2 F (36.8 C) 97.8 F (36.6 C) 98.2 F (36.8 C) 97.6 F (36.4 C)  TempSrc: Oral Oral Oral Oral  Resp: 16 16 16 16   SpO2: 100% 100% 98% 98%

## 2014-10-02 NOTE — ED Notes (Addendum)
Seen at [facility] earlier today, given meds and d/c'd with Rx, GI appt next month.  Lab results reviewed. C/o abd pain, also nv, mentions rectal bleeding onset 2d ago, describes as dark red, relates to "gastric ulcer", ongoing x2d, vomiting today, last emesis PTA, last seen for ulcer > 1 year ago.

## 2014-10-02 NOTE — ED Notes (Signed)
Pt states that she has a hx of ulcerative colitis and has been having abd pain and nausea x 2 days; pt states that she has had bloody mucous stools

## 2014-10-02 NOTE — ED Notes (Signed)
Pt ambulated to the BR with steady gait.   

## 2014-10-02 NOTE — ED Provider Notes (Signed)
CSN: [id number]     Arrival date & time 10/02/14  0507 History   First MD Initiated Contact with Patient 10/02/14 [phone]     Chief Complaint  Patient presents with  . Abdominal Pain     (Consider location/radiation/quality/duration/timing/severity/associated sxs/prior Treatment) HPI  This is a 28 year old female with a history of ulcerative colitis who presents with nausea and abdominal pain. Patient reports a 2 to 3 day history of periumbilical crampy abdominal pain and nausea. She states that the pain is constant. Nothing makes it better or worse. She has taken ibuprofen at home without relief. Current pain is 10 out of 10. She states the pain is consistent with her prior ulcerative colitis flares. She also reports nausea without vomiting and bloody stools; again, consistent with ulcerative colitis flares for her. She denies any fevers. Patient is followed by Dr. Deatra Ina at [place].  Reports that she last had a flare requiring prednisone approximately one month ago. She does not take any Biologics.   Past Medical History  Diagnosis Date  . Anemia   . Chronic ulcerative colitis   . Headache(784.0)   . Blood transfusion without reported diagnosis     "year ago" b/c of anemia & UC  . Allergy     SEASONAL   Past Surgical History  Procedure Laterality Date  . Colon biopsy    . Tonsillectomy    . Dilation and evacuation  08/21/2012    Procedure: DILATATION AND EVACUATION;  Surgeon: Logan Bores, MD;  Location: [facility];  Service: Gynecology;  Laterality: N/A;  . Cesarean section N/A 12/09/2013    Procedure: Primary Cesarean Section Delivery Baby Girl @ 1959, Apgars 8/8;  Surgeon: Logan Bores, MD;  Location: [facility];  Service: Obstetrics;  Laterality: N/A;  . Colonoscopy  01/19/2011   Family History  Problem Relation Age of Onset  . Anesthesia problems Neg Hx   . Hypotension Neg Hx   . Malignant hyperthermia Neg Hx   . Pseudochol deficiency Neg Hx   . Other Neg Hx   .  Alcohol abuse Neg Hx   . Arthritis Neg Hx   . Asthma Neg Hx   . Birth defects Neg Hx   . Cancer Neg Hx   . COPD Neg Hx   . Depression Neg Hx   . Diabetes Neg Hx   . Drug abuse Neg Hx   . Early death Neg Hx   . Hearing loss Neg Hx   . Heart disease Neg Hx   . Hyperlipidemia Neg Hx   . Hypertension Neg Hx   . Kidney disease Neg Hx   . Learning disabilities Neg Hx   . Mental illness Neg Hx   . Mental retardation Neg Hx   . Miscarriages / Stillbirths Neg Hx   . Stroke Neg Hx   . Vision loss Neg Hx    History  Substance Use Topics  . Smoking status: Former Smoker -- 3 years    Types: Cigarettes    Quit date: 05/16/2010  . Smokeless tobacco: Never Used  . Alcohol Use: No   OB History    Gravida Para Term Preterm AB TAB SAB Ectopic Multiple Living   3 2 2  1  1   2      Review of Systems  Constitutional: Negative for fever.  Respiratory: Negative for cough, chest tightness and shortness of breath.   Cardiovascular: Negative for chest pain.  Gastrointestinal: Positive for nausea, abdominal pain and blood in  stool. Negative for vomiting and diarrhea.  Genitourinary: Negative for dysuria.  Musculoskeletal: Negative for back pain.  Neurological: Negative for headaches.  Psychiatric/Behavioral: Negative for confusion.  All other systems reviewed and are negative.     Allergies  Latex  Home Medications   Prior to Admission medications   Medication Sig Start Date End Date Taking? Authorizing Provider  acetaminophen (TYLENOL) 325 MG tablet Take 650 mg by mouth every 6 (six) hours as needed for moderate pain.   Yes Historical Provider, MD  ibuprofen (ADVIL,MOTRIN) 200 MG tablet Take 400 mg by mouth every 6 (six) hours as needed for moderate pain.   Yes Historical Provider, MD  mesalamine (LIALDA) 1.2 G EC tablet Take 4 tablets (4.8 g total) by mouth daily with breakfast. 06/03/14  Yes Inda Castle, MD  dicyclomine (BENTYL) 20 MG tablet Take 1 tablet (20 mg total) by mouth  2 (two) times daily. 08/17/14   Malvin Johns, MD  hydrocortisone-pramoxine (PROCTOFOAM HC) rectal foam Place 1 applicator rectally 2 (two) times daily. 08/21/14   Inda Castle, MD  ondansetron (ZOFRAN ODT) 4 MG disintegrating tablet 75m ODT q4 hours prn nausea/vomit 08/17/14   MMalvin Johns MD  predniSONE (DELTASONE) 20 MG tablet Take 2 tablets (40 mg total) by mouth daily with breakfast. 08/21/14   RInda Castle MD  sulfaSALAzine (AZULFIDINE) 500 MG tablet Take 2 tablets (1,000 mg total) by mouth 2 (two) times daily. 10/01/14   RInda Castle MD   BP 141/91 mmHg  Pulse 84  Temp(Src) 98.2 F (36.8 C) (Oral)  Resp 16  SpO2 100%  Breastfeeding? No Physical Exam  Constitutional: She is oriented to person, place, and time. She appears well-developed and well-nourished. No distress.  HENT:  Head: Normocephalic and atraumatic.  Cardiovascular: Normal rate and regular rhythm.   Pulmonary/Chest: Effort normal and breath sounds normal. No respiratory distress.  Abdominal: Soft. Bowel sounds are normal. There is tenderness. There is no rebound and no guarding.  Mild TTP of the epigastrium without rebound or guarding  Neurological: She is alert and oriented to person, place, and time.  Skin: Skin is warm and dry.  Psychiatric: She has a normal mood and affect.  Nursing note and vitals reviewed.   ED Course  Procedures (including critical care time) Labs Review Labs Reviewed  CBC WITH DIFFERENTIAL - Abnormal; Notable for the following:    WBC 14.0 (*)    Neutro Abs 10.1 (*)    Monocytes Relative 14 (*)    Monocytes Absolute 2.0 (*)    All other components within normal limits  URINALYSIS, ROUTINE W REFLEX MICROSCOPIC - Abnormal; Notable for the following:    APPearance CLOUDY (*)    Ketones, ur 15 (*)    All other components within normal limits  COMPREHENSIVE METABOLIC PANEL  LIPASE, BLOOD  POC URINE PREG, ED  I-STAT BETA HCG BLOOD, ED ([facility], [facility], [facility] ONLY)    Imaging Review No  results found.   EKG Interpretation None      MDM   Final diagnoses:  Abdominal pain    Patient presents with abdominal pain. Consistent with prior ulcerative colitis flares. No evidence of peritonitis on exam. Patient given fluids and pain medication. Basic labwork obtained.  Patient with leukocytosis to 14 with left shift. Otherwise lab work is largely unremarkable. On recheck, patient reports persistent pain after administration of pain medication. Will obtain CT scan to rule out complication from ulcerative colitis.  Signed out to [employer]  If  CT is negative, anticipate patient can be discharged home with prednisone and close GI follow-up.    Merryl Hacker, MD 10/03/14 [phone]

## 2014-10-02 NOTE — ED Notes (Signed)
Pt stated she was leaving. nad noted.

## 2014-10-02 NOTE — ED Notes (Signed)
Pt does not have to urinate at this time.

## 2014-10-02 NOTE — ED Provider Notes (Signed)
Patient signed out to myself this morning with plan a follow-up CT scan and likely outpatient follow-up with gastroenterology. CT results reviewed no acute findings, mild colitis. Plan for short course steroids and follow-up early next week with gastroenterology. Patient improved symptomatically in ER.  Ct Abdomen Pelvis W Contrast  10/02/2014   CLINICAL DATA:  Generalized and mid abdominal pain and nausea for 2 days, history of ulcerative colitis, bloody mucus in stools  EXAM: CT ABDOMEN AND PELVIS WITH CONTRAST  TECHNIQUE: Multidetector CT imaging of the abdomen and pelvis was performed using the standard protocol following bolus administration of intravenous contrast. Sagittal and coronal MPR images reconstructed from axial data set.  CONTRAST:  113m OMNIPAQUE IOHEXOL 300 MG/ML SOLN IV. Suboptimal oral contrast.  COMPARISON:  CT abdomen and pelvis 08/14/2009  FINDINGS: Lung bases clear.  Liver, spleen, pancreas, kidneys, and adrenal glands normal appearance.  Scattered normal and upper normal size mesenteric lymph nodes.  Normal appendix.  Bowel wall thickening identified of the distal colon extending from the rectum to the distal transverse colon compatible with history of ulcerative colitis.  No definite pericolic inflammatory changes, evidence of perforation or abscess.  Stomach is opacified by contrast and unremarkable.  Remaining colon and small bowel loops unopacified and under distended, grossly unremarkable.  Normal appendix, uterus, adnexae, bladder, and ureters.  No mass, adenopathy or free fluid.  Osseous structures unremarkable.  IMPRESSION: Mild colonic wall thickening extending from rectum to distal transverse colon consistent with history of ulcerative colitis.  Degree of wall thickening is slightly less than seen on previous exam.  No additional intra-abdominal or intrapelvic abnormalities.   Electronically Signed   By: MLavonia DanaM.D.   On: 10/02/2014 08:22   Colitis, abd pain  JMariea Clonts MD 10/02/14 1209

## 2014-10-02 NOTE — ED Notes (Signed)
Pt reports mid abd pain with nausea x 2 "couple" of days.  Pt denies any vomiting or diarrhea at this time.  LBM-today

## 2014-10-03 ENCOUNTER — Encounter (HOSPITAL_COMMUNITY): Payer: Self-pay

## 2014-10-03 ENCOUNTER — Emergency Department (HOSPITAL_COMMUNITY)
Admission: EM | Admit: 2014-10-03 | Discharge: 2014-10-03 | Disposition: A | Discharge status: Home / Self Care | Payer: Medicaid Other | Attending: Emergency Medicine | Admitting: Emergency Medicine

## 2014-10-03 DIAGNOSIS — Z862 Personal history of diseases of the blood and blood-forming organs and certain disorders involving the immune mechanism: Secondary | ICD-10-CM | POA: Diagnosis not present

## 2014-10-03 DIAGNOSIS — Z87891 Personal history of nicotine dependence: Secondary | ICD-10-CM | POA: Insufficient documentation

## 2014-10-03 DIAGNOSIS — K529 Noninfective gastroenteritis and colitis, unspecified: Secondary | ICD-10-CM | POA: Diagnosis not present

## 2014-10-03 DIAGNOSIS — Z79899 Other long term (current) drug therapy: Secondary | ICD-10-CM | POA: Insufficient documentation

## 2014-10-03 DIAGNOSIS — Z91040 Latex allergy status: Secondary | ICD-10-CM | POA: Diagnosis not present

## 2014-10-03 DIAGNOSIS — R109 Unspecified abdominal pain: Secondary | ICD-10-CM | POA: Diagnosis present

## 2014-10-03 LAB — URINALYSIS, ROUTINE W REFLEX MICROSCOPIC
Glucose, UA: NEGATIVE mg/dL
Hgb urine dipstick: NEGATIVE
Ketones, ur: >80 mg/dL — AB
Leukocytes, UA: NEGATIVE
NITRITE: NEGATIVE
PROTEIN: 30 mg/dL — AB
Specific Gravity, Urine: 1.039 — ABNORMAL HIGH (ref 1.005–1.030)
UROBILINOGEN UA: 0.2 mg/dL (ref 0.0–1.0)
pH: 6 (ref 5.0–8.0)

## 2014-10-03 LAB — COMPREHENSIVE METABOLIC PANEL
ALT: 8 U/L (ref 0–35)
AST: 11 U/L (ref 0–37)
Albumin: 3.6 g/dL (ref 3.5–5.2)
Alkaline Phosphatase: 88 U/L (ref 39–117)
Anion gap: 17 — ABNORMAL HIGH (ref 5–15)
BUN: 8 mg/dL (ref 6–23)
CALCIUM: 9.8 mg/dL (ref 8.4–10.5)
CO2: 22 mEq/L (ref 19–32)
Chloride: 98 mEq/L (ref 96–112)
Creatinine, Ser: 0.43 mg/dL — ABNORMAL LOW (ref 0.50–1.10)
GFR calc Af Amer: >90 mL/min (ref >90)
GLUCOSE: 135 mg/dL — AB (ref 70–99)
Potassium: 3.6 mEq/L — ABNORMAL LOW (ref 3.7–5.3)
Sodium: 137 mEq/L (ref 137–147)
TOTAL PROTEIN: 7.8 g/dL (ref 6.0–8.3)
Total Bilirubin: <0.2 mg/dL — ABNORMAL LOW (ref 0.3–1.2)

## 2014-10-03 LAB — CBC WITH DIFFERENTIAL/PLATELET
Basophils Absolute: 0 10*3/uL (ref 0.0–0.1)
Basophils Relative: 0 % (ref 0–1)
EOS ABS: 0 10*3/uL (ref 0.0–0.7)
Eosinophils Relative: 0 % (ref 0–5)
HCT: 45.4 % (ref 36.0–46.0)
Hemoglobin: 15.9 g/dL — ABNORMAL HIGH (ref 12.0–15.0)
Lymphocytes Relative: 6 % — ABNORMAL LOW (ref 12–46)
Lymphs Abs: 1.2 10*3/uL (ref 0.7–4.0)
MCH: 31.5 pg (ref 26.0–34.0)
MCHC: 35 g/dL (ref 30.0–36.0)
MCV: 90.1 fL (ref 78.0–100.0)
MONO ABS: 3.1 10*3/uL — AB (ref 0.1–1.0)
Monocytes Relative: 15 % — ABNORMAL HIGH (ref 3–12)
NEUTROS PCT: 79 % — AB (ref 43–77)
Neutro Abs: 16.3 10*3/uL — ABNORMAL HIGH (ref 1.7–7.7)
Platelets: 380 10*3/uL (ref 150–400)
RBC: 5.04 MIL/uL (ref 3.87–5.11)
RDW: 14.1 % (ref 11.5–15.5)
WBC MORPHOLOGY: INCREASED
WBC: 20.6 10*3/uL — ABNORMAL HIGH (ref 4.0–10.5)

## 2014-10-03 LAB — URINE MICROSCOPIC-ADD ON

## 2014-10-03 MED ORDER — SODIUM CHLORIDE 0.9 % IV BOLUS (SEPSIS)
1000.0000 mL | Freq: Once | INTRAVENOUS | Status: AC
  Administered 2014-10-03: 1000 mL via INTRAVENOUS

## 2014-10-03 MED ORDER — MORPHINE SULFATE 4 MG/ML IJ SOLN
4.0000 mg | Freq: Once | INTRAMUSCULAR | Status: AC
  Administered 2014-10-03: 4 mg via INTRAVENOUS
  Filled 2014-10-03: qty 1

## 2014-10-03 MED ORDER — OXYCODONE-ACETAMINOPHEN 5-325 MG PO TABS: 1.0000 | ORAL_TABLET | Freq: Four times a day (QID) | ORAL | Status: DC | PRN

## 2014-10-03 MED ORDER — ONDANSETRON HCL 4 MG PO TABS: 4.0000 mg | ORAL_TABLET | Freq: Three times a day (TID) | ORAL | Status: DC | PRN

## 2014-10-03 MED ORDER — ONDANSETRON HCL 4 MG/2ML IJ SOLN
4.0000 mg | Freq: Once | INTRAMUSCULAR | Status: AC
  Administered 2014-10-03: 4 mg via INTRAVENOUS
  Filled 2014-10-03: qty 2

## 2014-10-03 NOTE — Discharge Instructions (Signed)
Colitis Colitis is inflammation of the colon. Colitis can be a short-term or long-standing (chronic) illness. Crohn's disease and ulcerative colitis are 2 types of colitis which are chronic. They usually require lifelong treatment. CAUSES  There are many different causes of colitis, including:  Viruses.  Germs (bacteria).  Medicine reactions. SYMPTOMS   Diarrhea.  Intestinal bleeding.  Pain.  Fever.  Throwing up (vomiting).  Tiredness (fatigue).  Weight loss.  Bowel blockage. DIAGNOSIS  The diagnosis of colitis is based on examination and stool or blood tests. X-rays, CT scan, and colonoscopy may also be needed. TREATMENT  Treatment may include:  Fluids given through the vein (intravenously).  Bowel rest (nothing to eat or drink for a period of time).  Medicine for pain and diarrhea.  Medicines (antibiotics) that kill germs.  Cortisone medicines.  Surgery. HOME CARE INSTRUCTIONS   Get plenty of rest.  Drink enough water and fluids to keep your urine clear or pale yellow.  Eat a well-balanced diet.  Call your caregiver for follow-up as recommended. SEEK IMMEDIATE MEDICAL CARE IF:   You develop chills.  You have an oral temperature above 102 F (38.9 C), not controlled by medicine.  You have extreme weakness, fainting, or dehydration.  You have repeated vomiting.  You develop severe belly (abdominal) pain or are passing bloody or tarry stools. MAKE SURE YOU:   Understand these instructions.  Will watch your condition.  Will get help right away if you are not doing well or get worse. Document Released: 12/14/2004 Document Revised: 01/29/2012 Document Reviewed: 03/11/2010 [facility] Patient Information 2015 [place], [place]. This information is not intended to replace advice given to you by your health care provider. Make sure you discuss any questions you have with your health care provider.

## 2014-10-03 NOTE — ED Notes (Signed)
Pt reports abd pain x2 days, states she been seen at previous facilities for same - pt also admits to x4 episodes of n/v in the past 24hrs and episodes of dark red blood noted in her stool. Pt denies any vaginal bleeding or discharge, fever, weakness or dizziness.

## 2014-10-03 NOTE — ED Notes (Signed)
Pt presents with c/o abdominal pain and vomiting that started yesterday, went to [facility] ER for same last night and left AMA. Pt denies diarrhea. NAD at this time.

## 2014-10-03 NOTE — ED Provider Notes (Signed)
CSN: [id number]     Arrival date & time 10/02/14  0507 History   First MD Initiated Contact with Patient 10/02/14 [phone]     Chief Complaint  Patient presents with  . Abdominal Pain     (Consider location/radiation/quality/duration/timing/severity/associated sxs/prior Treatment) HPI  This is a 28 year old female with history of ulcerative colitis who presents with persistent abdominal pain and vomiting. Patient was seen yesterday by myself in the ER. At that time her exam was benign and lab work was reassuring. CT scan showed colitis consistent with her known ulcerative colitis. She was placed on prednisone and Norco for pain. Patient reports that since discharge she has had persistent vomiting. She denies any bloody emesis. She also reports continued crampy abdominal pain that is currently 8 out of 10. She states "it's not that bad" but it still there. She also continues to note blood streaks in her stool. She denies any fevers. She has started taking prednisone and states that the Norco is not helping her pain. She has not called the GI office for follow-up yet.  Past Medical History  Diagnosis Date  . Anemia   . Chronic ulcerative colitis   . Headache(784.0)   . Blood transfusion without reported diagnosis     "year ago" b/c of anemia & UC  . Allergy     SEASONAL   Past Surgical History  Procedure Laterality Date  . Colon biopsy    . Tonsillectomy    . Dilation and evacuation  08/21/2012    Procedure: DILATATION AND EVACUATION;  Surgeon: Logan Bores, MD;  Location: [facility];  Service: Gynecology;  Laterality: N/A;  . Cesarean section N/A 12/09/2013    Procedure: Primary Cesarean Section Delivery Baby Girl @ 1959, Apgars 8/8;  Surgeon: Logan Bores, MD;  Location: [facility];  Service: Obstetrics;  Laterality: N/A;  . Colonoscopy  01/19/2011   Family History  Problem Relation Age of Onset  . Anesthesia problems Neg Hx   . Hypotension Neg Hx   . Malignant hyperthermia Neg  Hx   . Pseudochol deficiency Neg Hx   . Other Neg Hx   . Alcohol abuse Neg Hx   . Arthritis Neg Hx   . Asthma Neg Hx   . Birth defects Neg Hx   . Cancer Neg Hx   . COPD Neg Hx   . Depression Neg Hx   . Diabetes Neg Hx   . Drug abuse Neg Hx   . Early death Neg Hx   . Hearing loss Neg Hx   . Heart disease Neg Hx   . Hyperlipidemia Neg Hx   . Hypertension Neg Hx   . Kidney disease Neg Hx   . Learning disabilities Neg Hx   . Mental illness Neg Hx   . Mental retardation Neg Hx   . Miscarriages / Stillbirths Neg Hx   . Stroke Neg Hx   . Vision loss Neg Hx    History  Substance Use Topics  . Smoking status: Former Smoker -- 3 years    Types: Cigarettes    Quit date: 05/16/2010  . Smokeless tobacco: Never Used  . Alcohol Use: No   OB History    Gravida Para Term Preterm AB TAB SAB Ectopic Multiple Living   3 2 2  1  1   2      Review of Systems  Constitutional: Negative for fever.  Respiratory: Negative for cough, chest tightness and shortness of breath.   Cardiovascular: Negative for  chest pain.  Gastrointestinal: Positive for nausea, vomiting, abdominal pain and blood in stool.  Genitourinary: Negative for dysuria.  Musculoskeletal: Negative for back pain.  Skin: Negative for wound.  Neurological: Negative for headaches.  Psychiatric/Behavioral: Negative for confusion.  All other systems reviewed and are negative.     Allergies  Latex  Home Medications   Prior to Admission medications   Medication Sig Start Date End Date Taking? Authorizing Provider  acetaminophen (TYLENOL) 325 MG tablet Take 650 mg by mouth every 6 (six) hours as needed for moderate pain.   Yes Historical Provider, MD  ibuprofen (ADVIL,MOTRIN) 200 MG tablet Take 400 mg by mouth every 6 (six) hours as needed for moderate pain.   Yes Historical Provider, MD  mesalamine (LIALDA) 1.2 G EC tablet Take 4 tablets (4.8 g total) by mouth daily with breakfast. 06/03/14  Yes Inda Castle, MD   dicyclomine (BENTYL) 20 MG tablet Take 1 tablet (20 mg total) by mouth 2 (two) times daily. 08/17/14   Malvin Johns, MD  HYDROcodone-acetaminophen (NORCO) 5-325 MG per tablet Take 1-2 tablets by mouth every 4 (four) hours as needed. 10/02/14   Mariea Clonts, MD  hydrocortisone-pramoxine (PROCTOFOAM [facility]) rectal foam Place 1 applicator rectally 2 (two) times daily. 10/02/14   Mariea Clonts, MD  ondansetron (ZOFRAN ODT) 4 MG disintegrating tablet 76m ODT q4 hours prn nausea/vomit Patient taking differently: Take 4 mg by mouth every 8 (eight) hours as needed for nausea.  08/17/14   MMalvin Johns MD  predniSONE (DELTASONE) 20 MG tablet Take 2 tablets (40 mg total) by mouth daily with breakfast. 10/02/14   JMariea Clonts MD  sulfaSALAzine (AZULFIDINE) 500 MG tablet Take 2 tablets (1,000 mg total) by mouth 2 (two) times daily. 10/01/14   RInda Castle MD   BP 139/71 mmHg  Pulse 76  Temp(Src) 97.6 F (36.4 C) (Oral)  Resp 16  SpO2 98%  Breastfeeding? No Physical Exam  Constitutional: She is oriented to person, place, and time. She appears well-developed and well-nourished.  HENT:  Head: Normocephalic and atraumatic.  Cardiovascular: Normal rate, regular rhythm and normal heart sounds.   Pulmonary/Chest: Effort normal and breath sounds normal. No respiratory distress.  Abdominal: Soft. Bowel sounds are normal. There is tenderness. There is no rebound and no guarding.  Mild epigastric tenderness to palpation  Musculoskeletal: She exhibits no edema.  Neurological: She is alert and oriented to person, place, and time.  Skin: Skin is warm and dry.  Psychiatric: She has a normal mood and affect.  Nursing note and vitals reviewed.   ED Course  Procedures (including critical care time) Labs Review Labs Reviewed  CBC WITH DIFFERENTIAL - Abnormal; Notable for the following:    WBC 14.0 (*)    Neutro Abs 10.1 (*)    Monocytes Relative 14 (*)    Monocytes Absolute 2.0 (*)    All other  components within normal limits  COMPREHENSIVE METABOLIC PANEL - Abnormal; Notable for the following:    Potassium 3.6 (*)    Glucose, Bld 123 (*)    Total Bilirubin <0.2 (*)    All other components within normal limits  URINALYSIS, ROUTINE W REFLEX MICROSCOPIC - Abnormal; Notable for the following:    APPearance CLOUDY (*)    Ketones, ur 15 (*)    All other components within normal limits  LIPASE, BLOOD  POC URINE PREG, ED  I-STAT BETA HCG BLOOD, ED ([facility], [facility], [facility] ONLY)   Ct Abdomen Pelvis W  Contrast  10/02/2014   CLINICAL DATA:  Generalized and mid abdominal pain and nausea for 2 days, history of ulcerative colitis, bloody mucus in stools  EXAM: CT ABDOMEN AND PELVIS WITH CONTRAST  TECHNIQUE: Multidetector CT imaging of the abdomen and pelvis was performed using the standard protocol following bolus administration of intravenous contrast. Sagittal and coronal MPR images reconstructed from axial data set.  CONTRAST:  183m OMNIPAQUE IOHEXOL 300 MG/ML SOLN IV. Suboptimal oral contrast.  COMPARISON:  CT abdomen and pelvis 08/14/2009  FINDINGS: Lung bases clear.  Liver, spleen, pancreas, kidneys, and adrenal glands normal appearance.  Scattered normal and upper normal size mesenteric lymph nodes.  Normal appendix.  Bowel wall thickening identified of the distal colon extending from the rectum to the distal transverse colon compatible with history of ulcerative colitis.  No definite pericolic inflammatory changes, evidence of perforation or abscess.  Stomach is opacified by contrast and unremarkable.  Remaining colon and small bowel loops unopacified and under distended, grossly unremarkable.  Normal appendix, uterus, adnexae, bladder, and ureters.  No mass, adenopathy or free fluid.  Osseous structures unremarkable.  IMPRESSION: Mild colonic wall thickening extending from rectum to distal transverse colon consistent with history of ulcerative colitis.  Degree of wall thickening is slightly less than seen  on previous exam.  No additional intra-abdominal or intrapelvic abnormalities.   Electronically Signed   By: MLavonia DanaM.D.   On: 10/02/2014 08:22     Imaging Review Ct Abdomen Pelvis W Contrast  10/02/2014   CLINICAL DATA:  Generalized and mid abdominal pain and nausea for 2 days, history of ulcerative colitis, bloody mucus in stools  EXAM: CT ABDOMEN AND PELVIS WITH CONTRAST  TECHNIQUE: Multidetector CT imaging of the abdomen and pelvis was performed using the standard protocol following bolus administration of intravenous contrast. Sagittal and coronal MPR images reconstructed from axial data set.  CONTRAST:  1055mOMNIPAQUE IOHEXOL 300 MG/ML SOLN IV. Suboptimal oral contrast.  COMPARISON:  CT abdomen and pelvis 08/14/2009  FINDINGS: Lung bases clear.  Liver, spleen, pancreas, kidneys, and adrenal glands normal appearance.  Scattered normal and upper normal size mesenteric lymph nodes.  Normal appendix.  Bowel wall thickening identified of the distal colon extending from the rectum to the distal transverse colon compatible with history of ulcerative colitis.  No definite pericolic inflammatory changes, evidence of perforation or abscess.  Stomach is opacified by contrast and unremarkable.  Remaining colon and small bowel loops unopacified and under distended, grossly unremarkable.  Normal appendix, uterus, adnexae, bladder, and ureters.  No mass, adenopathy or free fluid.  Osseous structures unremarkable.  IMPRESSION: Mild colonic wall thickening extending from rectum to distal transverse colon consistent with history of ulcerative colitis.  Degree of wall thickening is slightly less than seen on previous exam.  No additional intra-abdominal or intrapelvic abnormalities.   Electronically Signed   By: MaLavonia Dana.D.   On: 10/02/2014 08:22     EKG Interpretation None      MDM   Final diagnoses:  Abdominal pain  Colitis    Patient presents with persistent abdominal pain and vomiting.  Nontoxic on exam. Vital signs are reassuring. Patient is afebrile. Exam is essentially unchanged from yesterday and abdomen is relatively benign without any signs of peritonitis. Repeat lab work with increased leukocytosis; however, patient has started prednisone.  Hemoglobin is stable.  Patient does have 80 ketones in her urine and evidence of dehydration. Patient was given fluids, Zofran, and pain medication. She reports improvement  on repeat evaluation. Discuss with patient continued care plan including prednisone and GI follow-up. Will add Zofran and change from Norco to Percocet. Do not feel she needs reimaging at this time.  After history, exam, and medical workup I feel the patient has been appropriately medically screened and is safe for discharge home. Pertinent diagnoses were discussed with the patient. Patient was given return precautions.     Merryl Hacker, MD 10/03/14 [phone]

## 2014-10-08 ENCOUNTER — Encounter: Payer: Self-pay | Admitting: Gastroenterology

## 2014-10-08 ENCOUNTER — Ambulatory Visit (INDEPENDENT_AMBULATORY_CARE_PROVIDER_SITE_OTHER): Payer: Medicaid Other | Admitting: Gastroenterology

## 2014-10-08 VITALS — BP 110/70 | HR 80 | Ht 59.0 in | Wt 135.2 lb

## 2014-10-08 DIAGNOSIS — K5190 Ulcerative colitis, unspecified, without complications: Secondary | ICD-10-CM

## 2014-10-08 NOTE — Patient Instructions (Signed)
Beth Mckew RN,  will contact you about your appointments to start Remicade  Start taking Prednisone As directed By Dr Deatra Ina  Follow up on 12/10/2014 at 10am

## 2014-10-08 NOTE — Progress Notes (Signed)
      History of Present Illness:  [name] return for follow-up of left-sided ulcerative colitis.  She was doing well on mesalamine alone until approximately 2 weeks ago when she developed lower abdominal pain with diarrhea and bleeding.  She was seen in the ED one week ago and placed on prednisone 40 mg daily.  She took this for one week with some improvement.  CT scan, which I reviewed, demonstrated thickening of the left colon to the distal transverse colon.  She has not been on recent antibiotics.    Review of Systems: Pertinent positive and negative review of systems were noted in the above HPI section. All other review of systems were otherwise negative.    Current Medications, Allergies, Past Medical History, Past Surgical History, Family History and Social History were reviewed in [facility] record  Vital signs were reviewed in today's medical record. Physical Exam: General: Well developed , well nourished, no acute distress Skin: anicteric Head: Normocephalic and atraumatic Eyes:  sclerae anicteric, EOMI Ears: Normal auditory acuity Mouth: No deformity or lesions Lungs: Clear throughout to auscultation Heart: Regular rate and rhythm; no murmurs, rubs or bruits Abdomen: Soft, non tender and non distended. No masses, hepatosplenomegaly or hernias noted. Normal Bowel sounds Rectal:deferred Musculoskeletal: Symmetrical with no gross deformities  Pulses:  Normal pulses noted Extremities: No clubbing, cyanosis, edema or deformities noted Neurological: Alert oriented x 4, grossly nonfocal Psychological:  Alert and cooperative. Normal mood and affect  See Assessment and Plan under Problem List

## 2014-10-08 NOTE — Assessment & Plan Note (Signed)
Patient has had yet another flare with CT demonstrating changes to the distal transverse colon.  In view of enhanced activity of her disease she will be started on Biologics.  Recommendations #1 resume prednisone 40 mg daily #2 continue mesalamine #3 begin Remicade infusions 5 mg/kg  We discussed biologic therapy including opportunistic infections was discussed with the patient      Patient was carefully instructed to call back in one week to report her progress.  If improved I will begin a steroid taper

## 2014-10-12 ENCOUNTER — Other Ambulatory Visit: Payer: Self-pay

## 2014-10-12 ENCOUNTER — Telehealth: Payer: Self-pay

## 2014-10-12 DIAGNOSIS — K5190 Ulcerative colitis, unspecified, without complications: Secondary | ICD-10-CM

## 2014-10-12 MED ORDER — DIPHENHYDRAMINE HCL 50 MG PO CAPS: 50.0000 mg | ORAL_CAPSULE | Freq: Once | ORAL | Status: DC

## 2014-10-12 MED ORDER — SODIUM CHLORIDE 0.9 % IV SOLN: 5.0000 mg/kg | Freq: Once | INTRAVENOUS | Status: DC

## 2014-10-12 MED ORDER — ACETAMINOPHEN 325 MG PO TABS
650.0000 mg | ORAL_TABLET | Freq: Once | ORAL | Status: DC
Start: 2014-10-20 — End: 2015-07-05

## 2014-10-12 NOTE — Telephone Encounter (Signed)
Contacted the patient with her first Remicade infusion appointment. She wrote down the information. She will go 10/20/14 and arrive 11:45 for her first infusion. She will be on the build up and will make her following appointments.

## 2014-11-17 ENCOUNTER — Other Ambulatory Visit (HOSPITAL_COMMUNITY): Payer: Self-pay | Admitting: *Deleted

## 2014-11-18 ENCOUNTER — Encounter (HOSPITAL_COMMUNITY)
Admission: RE | Admit: 2014-11-18 | Discharge: 2014-11-18 | Disposition: A | Discharge status: Home / Self Care | Payer: Medicaid Other | Source: Ambulatory Visit | Attending: Gastroenterology | Admitting: Gastroenterology

## 2014-11-18 ENCOUNTER — Other Ambulatory Visit: Payer: Self-pay

## 2014-11-18 VITALS — BP 98/57 | HR 83 | Temp 98.3°F | Resp 20 | Ht 59.0 in | Wt 125.0 lb

## 2014-11-18 DIAGNOSIS — Z5181 Encounter for therapeutic drug level monitoring: Secondary | ICD-10-CM | POA: Insufficient documentation

## 2014-11-18 DIAGNOSIS — K50919 Crohn's disease, unspecified, with unspecified complications: Secondary | ICD-10-CM

## 2014-11-18 DIAGNOSIS — N3010 Interstitial cystitis (chronic) without hematuria: Secondary | ICD-10-CM | POA: Diagnosis not present

## 2014-11-18 MED ORDER — SODIUM CHLORIDE 0.9 % IV SOLN
INTRAVENOUS | Status: DC
  Administered 2014-11-18: 13:00:00 via INTRAVENOUS
  Administered 2014-11-18: 250 mL via INTRAVENOUS

## 2014-11-18 MED ORDER — ACETAMINOPHEN 325 MG PO TABS
650.0000 mg | ORAL_TABLET | Freq: Once | ORAL | Status: AC
  Administered 2014-11-18: 650 mg via ORAL

## 2014-11-18 MED ORDER — DIPHENHYDRAMINE HCL 25 MG PO CAPS
50.0000 mg | ORAL_CAPSULE | Freq: Once | ORAL | Status: AC
  Administered 2014-11-18: 50 mg via ORAL

## 2014-11-18 MED ORDER — SODIUM CHLORIDE 0.9 % IV SOLN
5.0000 mg/kg | INTRAVENOUS | Status: DC
  Administered 2014-11-18: 300 mg via INTRAVENOUS
  Filled 2014-11-18: qty 30

## 2014-11-18 MED ORDER — ACETAMINOPHEN 325 MG PO TABS
ORAL_TABLET | ORAL | Status: AC
  Filled 2014-11-18: qty 2

## 2014-11-18 MED ORDER — DIPHENHYDRAMINE HCL 25 MG PO CAPS
ORAL_CAPSULE | ORAL | Status: AC
  Filled 2014-11-18: qty 2

## 2014-11-23 ENCOUNTER — Encounter (HOSPITAL_COMMUNITY): Payer: Self-pay | Admitting: Emergency Medicine

## 2014-11-23 ENCOUNTER — Emergency Department (HOSPITAL_COMMUNITY)
Admission: EM | Admit: 2014-11-23 | Discharge: 2014-11-23 | Discharge status: Against Medical Advice | Payer: Medicaid Other | Attending: Emergency Medicine | Admitting: Emergency Medicine

## 2014-11-23 DIAGNOSIS — J029 Acute pharyngitis, unspecified: Secondary | ICD-10-CM | POA: Insufficient documentation

## 2014-11-23 NOTE — ED Notes (Signed)
Pt states the right side of her neck and throat hurt when she swallows or coughs  Pt states it has been going on for about a week  Pt states she has had cold sxs for about a week as well

## 2014-11-23 NOTE — ED Notes (Addendum)
Called x 1 for room. No answer.

## 2014-11-30 ENCOUNTER — Encounter (HOSPITAL_COMMUNITY): Payer: Self-pay | Admitting: Emergency Medicine

## 2014-11-30 ENCOUNTER — Emergency Department (HOSPITAL_COMMUNITY)
Admission: EM | Admit: 2014-11-30 | Discharge: 2014-11-30 | Disposition: A | Discharge status: Home / Self Care | Payer: Medicaid Other | Attending: Emergency Medicine | Admitting: Emergency Medicine

## 2014-11-30 DIAGNOSIS — J069 Acute upper respiratory infection, unspecified: Secondary | ICD-10-CM | POA: Diagnosis not present

## 2014-11-30 DIAGNOSIS — J029 Acute pharyngitis, unspecified: Secondary | ICD-10-CM | POA: Diagnosis present

## 2014-11-30 DIAGNOSIS — Z8719 Personal history of other diseases of the digestive system: Secondary | ICD-10-CM | POA: Diagnosis not present

## 2014-11-30 DIAGNOSIS — Z87891 Personal history of nicotine dependence: Secondary | ICD-10-CM | POA: Diagnosis not present

## 2014-11-30 DIAGNOSIS — Z91040 Latex allergy status: Secondary | ICD-10-CM | POA: Insufficient documentation

## 2014-11-30 DIAGNOSIS — Z862 Personal history of diseases of the blood and blood-forming organs and certain disorders involving the immune mechanism: Secondary | ICD-10-CM | POA: Insufficient documentation

## 2014-11-30 LAB — RAPID STREP SCREEN (MED CTR MEBANE ONLY): Streptococcus, Group A Screen (Direct): NEGATIVE

## 2014-11-30 MED ORDER — LIDOCAINE VISCOUS 2 % MT SOLN: 20.0000 mL | OROMUCOSAL | Status: DC | PRN

## 2014-11-30 NOTE — ED Notes (Signed)
Pt c/o sore throat and cold x 4 weeks. Pt c/o throat is only sore on R side. Pt A&Ox4. Pt c/o mild cough and runny nose.

## 2014-11-30 NOTE — ED Provider Notes (Signed)
CSN: [id number]     Arrival date & time 11/30/14  1901 History  This chart was scribed for Hyman Bible, PA-C, working with Mirna Mires, MD by Starleen Arms, ED Scribe. This patient was seen in room WTR8/WTR8 and the patient's care was started at 7:36 PM.    Chief Complaint  Patient presents with  . Sore Throat  . URI   Patient is a 29 y.o. female presenting with URI. The history is provided by the patient. No language interpreter was used.  URI Presenting symptoms: congestion, cough and sore throat   Presenting symptoms: no ear pain and no fever    HPI Comments: Joyce Solomon is a 29 y.o. female who presents to the Emergency Department complaining of worseneing right-sided sore throat with associated cough that aggravates her throat as well as congestion onset 4 weeks ago.  Patient denies taking any medication for pain.  Patient reports a history of strep throat and is unsure if this episode is similar.  Patient denies fever, chills, ear pain, CP, SOB.  Past Medical History  Diagnosis Date  . Anemia   . Chronic ulcerative colitis   . Headache(784.0)   . Blood transfusion without reported diagnosis     "year ago" b/c of anemia & UC  . Allergy     SEASONAL   Past Surgical History  Procedure Laterality Date  . Colon biopsy    . Tonsillectomy    . Dilation and evacuation  08/21/2012    Procedure: DILATATION AND EVACUATION;  Surgeon: Logan Bores, MD;  Location: [facility];  Service: Gynecology;  Laterality: N/A;  . Cesarean section N/A 12/09/2013    Procedure: Primary Cesarean Section Delivery Baby Girl @ 1959, Apgars 8/8;  Surgeon: Logan Bores, MD;  Location: [facility];  Service: Obstetrics;  Laterality: N/A;  . Colonoscopy  01/19/2011   Family History  Problem Relation Age of Onset  . Anesthesia problems Neg Hx   . Hypotension Neg Hx   . Malignant hyperthermia Neg Hx   . Pseudochol deficiency Neg Hx   . Other Neg Hx   . Alcohol abuse Neg Hx   . Arthritis Neg  Hx   . Asthma Neg Hx   . Birth defects Neg Hx   . Cancer Neg Hx   . COPD Neg Hx   . Depression Neg Hx   . Diabetes Neg Hx   . Drug abuse Neg Hx   . Early death Neg Hx   . Hearing loss Neg Hx   . Heart disease Neg Hx   . Hyperlipidemia Neg Hx   . Hypertension Neg Hx   . Kidney disease Neg Hx   . Learning disabilities Neg Hx   . Mental illness Neg Hx   . Mental retardation Neg Hx   . Miscarriages / Stillbirths Neg Hx   . Stroke Neg Hx   . Vision loss Neg Hx    History  Substance Use Topics  . Smoking status: Former Smoker -- 3 years    Types: Cigarettes    Quit date: 05/16/2010  . Smokeless tobacco: Never Used  . Alcohol Use: No   OB History    Gravida Para Term Preterm AB TAB SAB Ectopic Multiple Living   3 2 2  1  1   2      Review of Systems  Constitutional: Negative for fever.  HENT: Positive for congestion and sore throat. Negative for ear pain.   Respiratory: Positive for cough. Negative  for shortness of breath.   Cardiovascular: Negative for chest pain.  All other systems reviewed and are negative.     Allergies  Latex  Home Medications   Prior to Admission medications   Medication Sig Start Date End Date Taking? Authorizing Provider  mesalamine (LIALDA) 1.2 G EC tablet Take 4 tablets (4.8 g total) by mouth daily with breakfast. 06/03/14   Inda Castle, MD   BP 140/88 mmHg  Pulse 109  Temp(Src) 98.4 F (36.9 C) (Oral)  Resp 16  SpO2 100% Physical Exam  Constitutional: She is oriented to person, place, and time. She appears well-developed and well-nourished. No distress.  HENT:  Head: Normocephalic and atraumatic.  Mouth/Throat: Oropharynx is clear and moist. No oropharyngeal exudate, posterior oropharyngeal edema or posterior oropharyngeal erythema.  TM's normal.  Canal's normal.  No erythema, edema, exudate, or trismus.  No maxillary or frontal sinus TTP.  Eyes: Conjunctivae and EOM are normal.  Neck: Neck supple. No tracheal deviation present.   Cardiovascular: Normal rate and regular rhythm.   Pulmonary/Chest: Effort normal and breath sounds normal. No respiratory distress.  Musculoskeletal: Normal range of motion.  Neurological: She is alert and oriented to person, place, and time.  Skin: Skin is warm and dry.  Psychiatric: She has a normal mood and affect. Her behavior is normal.  Nursing note and vitals reviewed.   ED Course  Procedures (including critical care time)  DIAGNOSTIC STUDIES: Oxygen Saturation is 100% on RA, normal by my interpretation.    COORDINATION OF CARE:  7:40 PM Will order strep test.  Patient acknowledges and agrees with plan.    Labs Review Labs Reviewed - No data to display  Imaging Review No results found.   EKG Interpretation None      MDM   Final diagnoses:  None   Patient presenting with sore throat, congestion, and cough.  Rapid strep negative.  She is afebrile.  Pulse ox 100 on RA.  Lungs CTAB.  Feel that symptoms are most consistent with Viral Illness.  Patient stable for discharge.  Return precautions given.    Hyman Bible, PA-C 12/02/14 [place], MD 12/09/14 [phone]

## 2014-12-01 ENCOUNTER — Other Ambulatory Visit (HOSPITAL_COMMUNITY): Payer: Self-pay | Admitting: *Deleted

## 2014-12-02 ENCOUNTER — Telehealth: Payer: Self-pay | Admitting: Gastroenterology

## 2014-12-02 ENCOUNTER — Encounter (HOSPITAL_COMMUNITY): Payer: Medicaid Other

## 2014-12-02 LAB — CULTURE, GROUP A STREP

## 2014-12-02 NOTE — Telephone Encounter (Signed)
Spoke with the patient. She will contact [facility] and reschedule her appointment. The orders were faxed on 11/16/14

## 2014-12-03 ENCOUNTER — Emergency Department (HOSPITAL_COMMUNITY)
Admission: EM | Admit: 2014-12-03 | Discharge: 2014-12-03 | Disposition: A | Discharge status: Home / Self Care | Payer: Medicaid Other | Attending: Emergency Medicine | Admitting: Emergency Medicine

## 2014-12-03 ENCOUNTER — Encounter (HOSPITAL_COMMUNITY): Payer: Self-pay | Admitting: Emergency Medicine

## 2014-12-03 DIAGNOSIS — Z8719 Personal history of other diseases of the digestive system: Secondary | ICD-10-CM | POA: Diagnosis not present

## 2014-12-03 DIAGNOSIS — Z87891 Personal history of nicotine dependence: Secondary | ICD-10-CM | POA: Insufficient documentation

## 2014-12-03 DIAGNOSIS — Z8709 Personal history of other diseases of the respiratory system: Secondary | ICD-10-CM | POA: Diagnosis not present

## 2014-12-03 DIAGNOSIS — Z862 Personal history of diseases of the blood and blood-forming organs and certain disorders involving the immune mechanism: Secondary | ICD-10-CM | POA: Insufficient documentation

## 2014-12-03 DIAGNOSIS — Z91040 Latex allergy status: Secondary | ICD-10-CM | POA: Insufficient documentation

## 2014-12-03 DIAGNOSIS — Z79899 Other long term (current) drug therapy: Secondary | ICD-10-CM | POA: Diagnosis not present

## 2014-12-03 DIAGNOSIS — R0789 Other chest pain: Secondary | ICD-10-CM

## 2014-12-03 DIAGNOSIS — R079 Chest pain, unspecified: Secondary | ICD-10-CM | POA: Diagnosis present

## 2014-12-03 LAB — BASIC METABOLIC PANEL
ANION GAP: 8 (ref 5–15)
BUN: 10 mg/dL (ref 6–23)
CALCIUM: 8.4 mg/dL (ref 8.4–10.5)
CO2: 21 mmol/L (ref 19–32)
Chloride: 104 mEq/L (ref 96–112)
Creatinine, Ser: 0.61 mg/dL (ref 0.50–1.10)
GFR calc Af Amer: >90 mL/min (ref >90)
Glucose, Bld: 146 mg/dL — ABNORMAL HIGH (ref 70–99)
Potassium: 3.6 mmol/L (ref 3.5–5.1)
SODIUM: 133 mmol/L — AB (ref 135–145)

## 2014-12-03 LAB — CBC
HCT: 41.1 % (ref 36.0–46.0)
HEMOGLOBIN: 13.9 g/dL (ref 12.0–15.0)
MCH: 31.2 pg (ref 26.0–34.0)
MCHC: 33.8 g/dL (ref 30.0–36.0)
MCV: 92.2 fL (ref 78.0–100.0)
Platelets: 274 10*3/uL (ref 150–400)
RBC: 4.46 MIL/uL (ref 3.87–5.11)
RDW: 13.8 % (ref 11.5–15.5)
WBC: 10.3 10*3/uL (ref 4.0–10.5)

## 2014-12-03 LAB — I-STAT TROPONIN, ED: Troponin i, poc: 0 ng/mL (ref 0.00–0.08)

## 2014-12-03 MED ORDER — PREDNISONE 50 MG PO TABS: 50.0000 mg | ORAL_TABLET | Freq: Every day | ORAL | Status: DC

## 2014-12-03 MED ORDER — HYDROCODONE-ACETAMINOPHEN 5-325 MG PO TABS: 1.0000 | ORAL_TABLET | Freq: Four times a day (QID) | ORAL | Status: DC | PRN

## 2014-12-03 NOTE — Discharge Instructions (Signed)
Return here as needed. Use heat on your chest. Follow up with a primary doctor.

## 2014-12-03 NOTE — ED Notes (Signed)
Pt c/o sharp chest pain, onset today, pt states she has had sore throat for a week, c/o pain in her breasts when she sneezes x one week. Denies SOB.

## 2014-12-03 NOTE — ED Provider Notes (Signed)
CSN: [id number]     Arrival date & time 12/03/14  1757 History   First MD Initiated Contact with Patient 12/03/14 2034     Chief Complaint  Patient presents with  . Chest Pain     (Consider location/radiation/quality/duration/timing/severity/associated sxs/prior Treatment) HPI Patient presents to the emergency department with right-sided chest pain that started 2 days ago.  She states she has had a recent URI as well with cough.  The patient states hurts worse with deep breathing and coughing.  Patient states that nothing seems make her condition better.  She states she did not take any pain medications at home for her symptoms.  The patient denies fever, weakness, dizziness, headache, blurred vision, shortness of breath, back pain, dysuria, abdominal pain, nausea, vomiting, diarrhea, or syncope.  The patient states that she was recently seen in the emergency department for sore throat and upper respiratory illness.  She states that she does not have any street of early cardiac disease Past Medical History  Diagnosis Date  . Anemia   . Chronic ulcerative colitis   . Headache(784.0)   . Blood transfusion without reported diagnosis     "year ago" b/c of anemia & UC  . Allergy     SEASONAL   Past Surgical History  Procedure Laterality Date  . Colon biopsy    . Tonsillectomy    . Dilation and evacuation  08/21/2012    Procedure: DILATATION AND EVACUATION;  Surgeon: Logan Bores, MD;  Location: [facility];  Service: Gynecology;  Laterality: N/A;  . Cesarean section N/A 12/09/2013    Procedure: Primary Cesarean Section Delivery Baby Girl @ 1959, Apgars 8/8;  Surgeon: Logan Bores, MD;  Location: [facility];  Service: Obstetrics;  Laterality: N/A;  . Colonoscopy  01/19/2011   Family History  Problem Relation Age of Onset  . Anesthesia problems Neg Hx   . Hypotension Neg Hx   . Malignant hyperthermia Neg Hx   . Pseudochol deficiency Neg Hx   . Other Neg Hx   . Alcohol abuse Neg Hx    . Arthritis Neg Hx   . Asthma Neg Hx   . Birth defects Neg Hx   . Cancer Neg Hx   . COPD Neg Hx   . Depression Neg Hx   . Diabetes Neg Hx   . Drug abuse Neg Hx   . Early death Neg Hx   . Hearing loss Neg Hx   . Heart disease Neg Hx   . Hyperlipidemia Neg Hx   . Hypertension Neg Hx   . Kidney disease Neg Hx   . Learning disabilities Neg Hx   . Mental illness Neg Hx   . Mental retardation Neg Hx   . Miscarriages / Stillbirths Neg Hx   . Stroke Neg Hx   . Vision loss Neg Hx    History  Substance Use Topics  . Smoking status: Former Smoker -- 3 years    Types: Cigarettes    Quit date: 05/16/2010  . Smokeless tobacco: Never Used  . Alcohol Use: No   OB History    Gravida Para Term Preterm AB TAB SAB Ectopic Multiple Living   3 2 2  1  1   2      Review of Systems  All other systems negative except as documented in the HPI. All pertinent positives and negatives as reviewed in the HPI.   Allergies  Latex  Home Medications   Prior to Admission medications   Medication  Sig Start Date End Date Taking? Authorizing Provider  ibuprofen (ADVIL,MOTRIN) 200 MG tablet Take 400 mg by mouth every 6 (six) hours as needed for moderate pain.   Yes Historical Provider, MD  mesalamine (LIALDA) 1.2 G EC tablet Take 4 tablets (4.8 g total) by mouth daily with breakfast. 06/03/14  Yes Inda Castle, MD  lidocaine (XYLOCAINE) 2 % solution Use as directed 20 mLs in the mouth or throat as needed for mouth pain. Patient not taking: Reported on 12/03/2014 11/30/14   Hyman Bible, PA-C   BP 112/78 mmHg  Pulse 80  Temp(Src) 98.2 F (36.8 C) (Oral)  Resp 18  SpO2 100% Physical Exam  Constitutional: She is oriented to person, place, and time. She appears well-developed and well-nourished. No distress.  HENT:  Head: Normocephalic and atraumatic.  Mouth/Throat: Oropharynx is clear and moist.  Eyes: Pupils are equal, round, and reactive to light.  Neck: Normal range of motion. Neck supple.   Cardiovascular: Normal rate, regular rhythm and normal heart sounds.  Exam reveals no gallop and no friction rub.   No murmur heard. Pulmonary/Chest: Effort normal and breath sounds normal. No respiratory distress. She has no wheezes. She exhibits tenderness.  Musculoskeletal: She exhibits no edema.  Neurological: She is alert and oriented to person, place, and time. She exhibits normal muscle tone. Coordination normal.  Skin: Skin is warm and dry. No rash noted. No erythema.  Nursing note and vitals reviewed.   ED Course  Procedures (including critical care time) Labs Review Labs Reviewed  BASIC METABOLIC PANEL - Abnormal; Notable for the following:    Sodium 133 (*)    Glucose, Bld 146 (*)    All other components within normal limits  CBC  I-STAT TROPOININ, ED    Imaging Review No results found.   EKG Interpretation   Date/Time:  Thursday December 03 2014 18:06:17 EST Ventricular Rate:  88 PR Interval:  128 QRS Duration: 77 QT Interval:  358 QTC Calculation: 433 R Axis:   32 Text Interpretation:  Sinus rhythm ED PHYSICIAN INTERPRETATION AVAILABLE  IN [facility] [place] Confirmed by TEST, Record ([id number]) on 12/05/2014 9:07:01  AM     Patient most likely has chest wall pain based on her history of present illness and physical exam findings.  Patient is advised return here as needed.  Told to use heat on her chest wall MDM   Final diagnoses:  None        Brent General, PA-C 12/05/14 1543  Pamella Pert, MD 12/05/14 [phone]

## 2014-12-15 ENCOUNTER — Ambulatory Visit: Payer: Medicaid Other | Admitting: Gastroenterology

## 2014-12-17 ENCOUNTER — Other Ambulatory Visit (HOSPITAL_COMMUNITY): Payer: Self-pay | Admitting: *Deleted

## 2014-12-17 ENCOUNTER — Telehealth: Payer: Self-pay | Admitting: Gastroenterology

## 2014-12-17 NOTE — Telephone Encounter (Signed)
Patient is in the build up stage of Remicade. Has had 1 infusion which was 11/18/14. She will have to start over again. Appointment made for 12/25/14 at [facility]. New orders faxed. Patient notified to be at the hospital at 10:00 on 12/25/14

## 2014-12-24 ENCOUNTER — Other Ambulatory Visit (HOSPITAL_COMMUNITY): Payer: Self-pay | Admitting: *Deleted

## 2014-12-25 ENCOUNTER — Encounter (HOSPITAL_COMMUNITY)
Admission: RE | Admit: 2014-12-25 | Discharge: 2014-12-25 | Disposition: A | Discharge status: Home / Self Care | Payer: Medicaid Other | Source: Ambulatory Visit | Attending: Gastroenterology | Admitting: Gastroenterology

## 2014-12-25 DIAGNOSIS — K5190 Ulcerative colitis, unspecified, without complications: Secondary | ICD-10-CM | POA: Insufficient documentation

## 2014-12-25 DIAGNOSIS — Z79899 Other long term (current) drug therapy: Secondary | ICD-10-CM | POA: Diagnosis not present

## 2014-12-25 MED ORDER — SODIUM CHLORIDE 0.9 % IV SOLN
INTRAVENOUS | Status: DC
  Administered 2014-12-25: 11:00:00 via INTRAVENOUS

## 2014-12-25 MED ORDER — DIPHENHYDRAMINE HCL 25 MG PO CAPS
ORAL_CAPSULE | ORAL | Status: AC
  Filled 2014-12-25: qty 2

## 2014-12-25 MED ORDER — DIPHENHYDRAMINE HCL 25 MG PO TABS
50.0000 mg | ORAL_TABLET | Freq: Once | ORAL | Status: AC
  Administered 2014-12-25: 50 mg via ORAL
  Filled 2014-12-25: qty 2

## 2014-12-25 MED ORDER — SODIUM CHLORIDE 0.9 % IV SOLN
5.0000 mg/kg | INTRAVENOUS | Status: DC
  Administered 2014-12-25: 300 mg via INTRAVENOUS
  Filled 2014-12-25: qty 30

## 2014-12-25 MED ORDER — ACETAMINOPHEN 325 MG PO TABS
ORAL_TABLET | ORAL | Status: AC
  Filled 2014-12-25: qty 2

## 2014-12-25 MED ORDER — ACETAMINOPHEN 325 MG PO TABS
650.0000 mg | ORAL_TABLET | Freq: Four times a day (QID) | ORAL | Status: DC | PRN
  Administered 2014-12-25: 650 mg via ORAL

## 2015-01-05 ENCOUNTER — Encounter (HOSPITAL_COMMUNITY)
Admission: RE | Admit: 2015-01-05 | Discharge: 2015-01-05 | Disposition: A | Discharge status: Home / Self Care | Payer: Medicaid Other | Source: Ambulatory Visit | Attending: Gastroenterology | Admitting: Gastroenterology

## 2015-01-05 DIAGNOSIS — K5190 Ulcerative colitis, unspecified, without complications: Secondary | ICD-10-CM | POA: Diagnosis not present

## 2015-01-05 MED ORDER — ACETAMINOPHEN 325 MG PO TABS
650.0000 mg | ORAL_TABLET | Freq: Four times a day (QID) | ORAL | Status: DC | PRN
  Administered 2015-01-05: 650 mg via ORAL

## 2015-01-05 MED ORDER — DIPHENHYDRAMINE HCL 25 MG PO CAPS
ORAL_CAPSULE | ORAL | Status: DC
Start: 2015-01-05 — End: 2015-01-06
  Filled 2015-01-05: qty 2

## 2015-01-05 MED ORDER — SODIUM CHLORIDE 0.9 % IV SOLN
INTRAVENOUS | Status: DC
  Administered 2015-01-05: 09:00:00 via INTRAVENOUS

## 2015-01-05 MED ORDER — ACETAMINOPHEN 325 MG PO TABS
ORAL_TABLET | ORAL | Status: AC
  Filled 2015-01-05: qty 2

## 2015-01-05 MED ORDER — DIPHENHYDRAMINE HCL 25 MG PO CAPS
50.0000 mg | ORAL_CAPSULE | Freq: Once | ORAL | Status: AC
  Administered 2015-01-05: 50 mg via ORAL

## 2015-01-05 MED ORDER — SODIUM CHLORIDE 0.9 % IV SOLN
5.0000 mg/kg | INTRAVENOUS | Status: DC
  Administered 2015-01-05: 300 mg via INTRAVENOUS
  Filled 2015-01-05: qty 30

## 2015-01-12 ENCOUNTER — Emergency Department (INDEPENDENT_AMBULATORY_CARE_PROVIDER_SITE_OTHER)
Admission: EM | Admit: 2015-01-12 | Discharge: 2015-01-12 | Disposition: A | Discharge status: Home / Self Care | Payer: Medicaid Other | Source: Home / Self Care | Attending: Family Medicine | Admitting: Family Medicine

## 2015-01-12 ENCOUNTER — Encounter (HOSPITAL_COMMUNITY): Payer: Self-pay | Admitting: Emergency Medicine

## 2015-01-12 DIAGNOSIS — J029 Acute pharyngitis, unspecified: Secondary | ICD-10-CM

## 2015-01-12 DIAGNOSIS — J3489 Other specified disorders of nose and nasal sinuses: Secondary | ICD-10-CM | POA: Diagnosis not present

## 2015-01-12 MED ORDER — GI COCKTAIL ~~LOC~~
30.0000 mL | Freq: Once | ORAL | Status: AC
  Administered 2015-01-12: 30 mL via ORAL

## 2015-01-12 MED ORDER — GI COCKTAIL ~~LOC~~
ORAL | Status: AC
  Filled 2015-01-12: qty 30

## 2015-01-12 MED ORDER — OMEPRAZOLE 40 MG PO CPDR: 40.0000 mg | DELAYED_RELEASE_CAPSULE | Freq: Every day | ORAL | Status: DC

## 2015-01-12 MED ORDER — IPRATROPIUM BROMIDE 0.06 % NA SOLN: 2.0000 | Freq: Four times a day (QID) | NASAL | Status: DC

## 2015-01-12 MED ORDER — FLUTICASONE PROPIONATE 50 MCG/ACT NA SUSP: 2.0000 | Freq: Every day | NASAL | Status: DC

## 2015-01-12 NOTE — ED Notes (Signed)
Reports throat pain x 3 months.  Pt seen in ED and given numbing meds for throat but is having no relief.  Denies fever, n/v/d

## 2015-01-12 NOTE — ED Provider Notes (Signed)
CSN: [id number]     Arrival date & time 01/12/15  1315 History   First MD Initiated Contact with Patient 01/12/15 1437     Chief Complaint  Patient presents with  . Sore Throat    throat pain   (Consider location/radiation/quality/duration/timing/severity/associated sxs/prior Treatment) HPI  Sore throat: started 2-3 mo ago. Went to ED on 11/30/14. Told she has a viral uri and given lidocaine solution for pain. No relief since that time. Worse w/ coughing and in the morning after waking up. Occasionally will cause voice to change. Pain is constant. Associated with months worth of cough and runny nose.    Past Medical History  Diagnosis Date  . Anemia   . Chronic ulcerative colitis   . Headache(784.0)   . Blood transfusion without reported diagnosis     "year ago" b/c of anemia & UC  . Allergy     SEASONAL   Past Surgical History  Procedure Laterality Date  . Colon biopsy    . Tonsillectomy    . Dilation and evacuation  08/21/2012    Procedure: DILATATION AND EVACUATION;  Surgeon: Logan Bores, MD;  Location: [facility];  Service: Gynecology;  Laterality: N/A;  . Cesarean section N/A 12/09/2013    Procedure: Primary Cesarean Section Delivery Baby Girl @ 1959, Apgars 8/8;  Surgeon: Logan Bores, MD;  Location: [facility];  Service: Obstetrics;  Laterality: N/A;  . Colonoscopy  01/19/2011   Family History  Problem Relation Age of Onset  . Anesthesia problems Neg Hx   . Hypotension Neg Hx   . Malignant hyperthermia Neg Hx   . Pseudochol deficiency Neg Hx   . Other Neg Hx   . Alcohol abuse Neg Hx   . Arthritis Neg Hx   . Asthma Neg Hx   . Birth defects Neg Hx   . Cancer Neg Hx   . COPD Neg Hx   . Depression Neg Hx   . Diabetes Neg Hx   . Drug abuse Neg Hx   . Early death Neg Hx   . Hearing loss Neg Hx   . Heart disease Neg Hx   . Hyperlipidemia Neg Hx   . Hypertension Neg Hx   . Kidney disease Neg Hx   . Learning disabilities Neg Hx   . Mental illness Neg Hx   .  Mental retardation Neg Hx   . Miscarriages / Stillbirths Neg Hx   . Stroke Neg Hx   . Vision loss Neg Hx    History  Substance Use Topics  . Smoking status: Former Smoker -- 3 years    Types: Cigarettes    Quit date: 05/16/2010  . Smokeless tobacco: Never Used  . Alcohol Use: No   OB History    Gravida Para Term Preterm AB TAB SAB Ectopic Multiple Living   3 2 2  1  1   2      Review of Systems Per HPI with all other pertinent systems negative.   Allergies  Latex  Home Medications   Prior to Admission medications   Medication Sig Start Date End Date Taking? Authorizing Provider  fluticasone (FLONASE) 50 MCG/ACT nasal spray Place 2 sprays into both nostrils at bedtime. 01/12/15   Waldemar Dickens, MD  HYDROcodone-acetaminophen (NORCO/VICODIN) 5-325 MG per tablet Take 1 tablet by mouth every 6 (six) hours as needed for moderate pain. 12/03/14   Resa Miner Lawyer, PA-C  ibuprofen (ADVIL,MOTRIN) 200 MG tablet Take 400 mg by mouth every 6 (  six) hours as needed for moderate pain.    Historical Provider, MD  ipratropium (ATROVENT) 0.06 % nasal spray Place 2 sprays into both nostrils 4 (four) times daily. 01/12/15   Waldemar Dickens, MD  lidocaine (XYLOCAINE) 2 % solution Use as directed 20 mLs in the mouth or throat as needed for mouth pain. Patient not taking: Reported on 12/03/2014 11/30/14   Hyman Bible, PA-C  mesalamine (LIALDA) 1.2 G EC tablet Take 4 tablets (4.8 g total) by mouth daily with breakfast. 06/03/14   Inda Castle, MD  omeprazole (PRILOSEC) 40 MG capsule Take 1 capsule (40 mg total) by mouth daily. 01/12/15   Waldemar Dickens, MD   BP 121/80 mmHg  Pulse 70  Temp(Src) 97.5 F (36.4 C) (Oral)  Resp 16  SpO2 98% Physical Exam  Constitutional: She is oriented to person, place, and time. She appears well-developed and well-nourished. No distress.  HENT:  rinorrhea on exam. Pharyngeal cobblestoning  Eyes: EOM are normal. Pupils are equal, round, and reactive to  light.  Neck: Normal range of motion.  Cardiovascular: Normal rate, normal heart sounds and intact distal pulses.   No murmur heard. Pulmonary/Chest: Effort normal and breath sounds normal.  Abdominal: Soft. Bowel sounds are normal.  Musculoskeletal: Normal range of motion. She exhibits no edema or tenderness.  Neurological: She is alert and oriented to person, place, and time.  Skin: Skin is warm and dry. She is not diaphoretic.  Psychiatric: She has a normal mood and affect. Her behavior is normal. Thought content normal.    ED Course  Procedures (including critical care time) Labs Review Labs Reviewed - No data to display  Imaging Review No results found.   MDM   1. Sore throat   2. Rhinorrhea    Symptoms very likely secondary to reflux and postnasal drip. GI cocktail given in office with improvement. Start Prilosec, Flonase, nasal Atrovent. Patient follow-up in emergency room if symptoms worsen and to call ENT if hoarseness returns and persists after starting therapies mentioned above.    Waldemar Dickens, MD 01/12/15 [phone]

## 2015-01-12 NOTE — Discharge Instructions (Signed)
Your sore throat is likely due to 2 different conditions. One of these is reflux. Please start taking Prilosec or Zantac every day. Your symptoms are also probably due to recurrent irritation from postnasal drip from a runny nose. Please use Flonase every night and Atrovent during the day. Please also consider using a daily allergy pill. Please go to the emergency room if her symptoms get significantly worse.

## 2015-01-25 ENCOUNTER — Ambulatory Visit (INDEPENDENT_AMBULATORY_CARE_PROVIDER_SITE_OTHER): Payer: Medicaid Other | Admitting: Gastroenterology

## 2015-01-25 ENCOUNTER — Encounter: Payer: Self-pay | Admitting: Gastroenterology

## 2015-01-25 VITALS — BP 102/60 | HR 72 | Ht 59.75 in | Wt 134.5 lb

## 2015-01-25 DIAGNOSIS — K5190 Ulcerative colitis, unspecified, without complications: Secondary | ICD-10-CM

## 2015-01-25 NOTE — Addendum Note (Signed)
Addended by: Larina Bras on: 01/25/2015 04:30 PM   Modules accepted: Orders

## 2015-01-25 NOTE — Progress Notes (Signed)
      History of Present Illness:  Joyce Solomon is here for follow-up of left-sided ulcerative colitis.  Is on maintenance therapy with Remicade and lialda he reports complete resolution of symptoms.  She's having normal solid bowel movements.  She is without pain or bleeding.    Review of Systems: Pertinent positive and negative review of systems were noted in the above HPI section. All other review of systems were otherwise negative.    Current Medications, Allergies, Past Medical History, Past Surgical History, Family History and Social History were reviewed in [facility] record  Vital signs were reviewed in today's medical record. Physical Exam: General: Well developed , well nourished, no acute distress   See Assessment and Plan under Problem List

## 2015-01-25 NOTE — Patient Instructions (Addendum)
Please follow up with Dr Deatra Ina in 6 months.  Your physician has requested that you go to the basement for the following lab work 1 week to your office visit in 6 months: CBC, CMET, TB gold quantiferon, Hepatitis A, B and C serologies

## 2015-01-25 NOTE — Assessment & Plan Note (Signed)
Excellent response to Remicade infusions.  Plan to continue with the same in addition to [place].  Although requested, patient has not had serologies for hepatitis or quantiferon.  This will be ordered at her blood drawing

## 2015-01-29 ENCOUNTER — Encounter (HOSPITAL_COMMUNITY): Payer: Self-pay | Admitting: Emergency Medicine

## 2015-01-29 ENCOUNTER — Emergency Department (INDEPENDENT_AMBULATORY_CARE_PROVIDER_SITE_OTHER)
Admission: EM | Admit: 2015-01-29 | Discharge: 2015-01-29 | Disposition: A | Discharge status: Home / Self Care | Payer: Medicaid Other | Source: Home / Self Care | Attending: Emergency Medicine | Admitting: Emergency Medicine

## 2015-01-29 DIAGNOSIS — S025XXA Fracture of tooth (traumatic), initial encounter for closed fracture: Secondary | ICD-10-CM | POA: Diagnosis not present

## 2015-01-29 MED ORDER — PENICILLIN V POTASSIUM 500 MG PO TABS: 500.0000 mg | ORAL_TABLET | Freq: Four times a day (QID) | ORAL | Status: AC

## 2015-01-29 MED ORDER — HYDROCODONE-ACETAMINOPHEN 5-325 MG PO TABS: 1.0000 | ORAL_TABLET | Freq: Four times a day (QID) | ORAL | Status: DC | PRN

## 2015-01-29 NOTE — Discharge Instructions (Signed)
Dental Fracture You have a dental fracture or injury. This can mean the tooth is loose, has a chip in the enamel or is broken. If just the outer enamel is chipped, there is a good chance the tooth will not become infected. The only treatment needed may be to smooth off a rough edge. Fractures into the deeper layers (dentin and pulp) cause greater pain and are more likely to become infected. These require you to see a dentist as soon as possible to save the tooth. Loose teeth may need to be wired or bonded with a plastic splint to hold them in place. A paste may be painted on the open area of the broken tooth to reduce the pain. Antibiotics and pain medicine may be prescribed. Choosing a soft or liquid diet and rinsing the mouth out with warm water after meals may be helpful. See your dentist as recommended. Failure to seek care or follow up with a dentist or other specialist as recommended could result in the loss of your tooth, infection, or permanent dental problems. SEEK MEDICAL CARE IF:   You have increased pain not controlled with medicines.  You have swelling around the tooth, in the face or neck.  You have bleeding which starts, continues, or gets worse.  You have a fever. Document Released: 12/14/2004 Document Revised: 01/29/2012 Document Reviewed: 09/28/2009 [facility] Patient Information 2015 [place], [place]. This information is not intended to replace advice given to you by your health care provider. Make sure you discuss any questions you have with your health care provider.  Take penicillin 4 times a day for 10 days to prevent infection. Take the Norco every 4-6 hours as needed for pain. Get Orajel at the drug store and use as directed on the packaging. Follow-up with the dentist as soon as possible.

## 2015-01-29 NOTE — ED Notes (Signed)
C/o dental pain onset last night Denies fevers, chills Alert, no signs of acute distress.

## 2015-01-29 NOTE — ED Provider Notes (Signed)
CSN: [id number]     Arrival date & time 01/29/15  1937 History   First MD Initiated Contact with Patient 01/29/15 2058     Chief Complaint  Patient presents with  . Dental Pain   (Consider location/radiation/quality/duration/timing/severity/associated sxs/prior Treatment) HPI  She is a 29 year old woman here for evaluation of dental pain. She states that she woke up this morning with pain in her lower left molar and noted part of the tooth had broken off. She denies any fever. She has not tried any over-the-counter medications. She is trying to see a dentist, but needs to update her Medicaid card first.  Past Medical History  Diagnosis Date  . Anemia   . Chronic ulcerative colitis   . Headache(784.0)   . Blood transfusion without reported diagnosis     "year ago" b/c of anemia & UC  . Allergy     SEASONAL   Past Surgical History  Procedure Laterality Date  . Colon biopsy    . Tonsillectomy    . Dilation and evacuation  08/21/2012    Procedure: DILATATION AND EVACUATION;  Surgeon: Logan Bores, MD;  Location: [facility];  Service: Gynecology;  Laterality: N/A;  . Cesarean section N/A 12/09/2013    Procedure: Primary Cesarean Section Delivery Baby Girl @ 1960, Apgars 8/8;  Surgeon: Logan Bores, MD;  Location: [facility];  Service: Obstetrics;  Laterality: N/A;  . Colonoscopy  01/19/2011   Family History  Problem Relation Age of Onset  . Anesthesia problems Neg Hx   . Hypotension Neg Hx   . Malignant hyperthermia Neg Hx   . Pseudochol deficiency Neg Hx   . Other Neg Hx   . Alcohol abuse Neg Hx   . Arthritis Neg Hx   . Asthma Neg Hx   . Birth defects Neg Hx   . Cancer Neg Hx   . COPD Neg Hx   . Depression Neg Hx   . Diabetes Neg Hx   . Drug abuse Neg Hx   . Early death Neg Hx   . Hearing loss Neg Hx   . Heart disease Neg Hx   . Hyperlipidemia Neg Hx   . Hypertension Neg Hx   . Kidney disease Neg Hx   . Learning disabilities Neg Hx   . Mental illness Neg Hx   .  Mental retardation Neg Hx   . Miscarriages / Stillbirths Neg Hx   . Stroke Neg Hx   . Vision loss Neg Hx    History  Substance Use Topics  . Smoking status: Former Smoker -- 3 years    Types: Cigarettes    Quit date: 05/16/2010  . Smokeless tobacco: Never Used  . Alcohol Use: No   OB History    Gravida Para Term Preterm AB TAB SAB Ectopic Multiple Living   3 2 2  1  1   2      Review of Systems As in history of present illness Allergies  Latex  Home Medications   Prior to Admission medications   Medication Sig Start Date End Date Taking? Authorizing Provider  fluticasone (FLONASE) 50 MCG/ACT nasal spray Place 2 sprays into both nostrils at bedtime. 01/12/15   Waldemar Dickens, MD  HYDROcodone-acetaminophen (NORCO) 5-325 MG per tablet Take 1 tablet by mouth every 6 (six) hours as needed for moderate pain. 01/29/15   Melony Overly, MD  ipratropium (ATROVENT) 0.06 % nasal spray Place 2 sprays into both nostrils 4 (four) times daily. 01/12/15  Waldemar Dickens, MD  lidocaine (XYLOCAINE) 2 % solution Use as directed 20 mLs in the mouth or throat as needed for mouth pain. 11/30/14   Hyman Bible, PA-C  mesalamine (LIALDA) 1.2 G EC tablet Take 4 tablets (4.8 g total) by mouth daily with breakfast. 06/03/14   Inda Castle, MD  omeprazole (PRILOSEC) 40 MG capsule Take 1 capsule (40 mg total) by mouth daily. 01/12/15   Waldemar Dickens, MD  penicillin v potassium (VEETID) 500 MG tablet Take 1 tablet (500 mg total) by mouth 4 (four) times daily. 01/29/15 02/05/15  Melony Overly, MD   BP 108/76 mmHg  Pulse 74  Temp(Src) 98 F (36.7 C) (Oral)  Resp 20  SpO2 100% Physical Exam  Constitutional: She is oriented to person, place, and time. She appears well-developed and well-nourished. No distress.  HENT:  Mouth/Throat:    Cardiovascular: Normal rate.   Pulmonary/Chest: Effort normal.  Neurological: She is alert and oriented to person, place, and time.    ED Course  Procedures (including  critical care time) Labs Review Labs Reviewed - No data to display  Imaging Review No results found.   MDM   1. Broken tooth, closed, initial encounter    We'll treat with a 10 day course of penicillin prophylactically. Norco for pain. She is unable to take NSAIDs due to ulcerative colitis. Recommended over-the-counter Orajel. Follow-up with a dentist as soon as possible.    Melony Overly, MD 01/29/15 2110

## 2015-02-03 ENCOUNTER — Emergency Department (INDEPENDENT_AMBULATORY_CARE_PROVIDER_SITE_OTHER)
Admission: EM | Admit: 2015-02-03 | Discharge: 2015-02-03 | Disposition: A | Discharge status: Home / Self Care | Payer: Medicaid Other | Source: Home / Self Care | Attending: Family Medicine | Admitting: Family Medicine

## 2015-02-03 ENCOUNTER — Encounter (HOSPITAL_COMMUNITY): Payer: Self-pay

## 2015-02-03 DIAGNOSIS — G4700 Insomnia, unspecified: Secondary | ICD-10-CM

## 2015-02-03 MED ORDER — TRAZODONE HCL 50 MG PO TABS: 50.0000 mg | ORAL_TABLET | Freq: Every day | ORAL | Status: DC

## 2015-02-03 NOTE — ED Notes (Signed)
C/o cant sleep and having headaches for a long time. Has reportedly not seen a MD for this problem

## 2015-02-03 NOTE — ED Provider Notes (Signed)
CSN: [id number]     Arrival date & time 02/03/15  1924 History   First MD Initiated Contact with Patient 02/03/15 2036     Chief Complaint  Patient presents with  . Insomnia   (Consider location/radiation/quality/duration/timing/severity/associated sxs/prior Treatment) Patient is a 29 y.o. female presenting with general illness. The history is provided by the patient.  Illness Location:  Problems with sleep for yrs, no treatment sought. Chronicity:  Chronic Associated symptoms: headaches   Associated symptoms: no abdominal pain, no chest pain, no fever and no shortness of breath     Past Medical History  Diagnosis Date  . Anemia   . Chronic ulcerative colitis   . Headache(784.0)   . Blood transfusion without reported diagnosis     "year ago" b/c of anemia & UC  . Allergy     SEASONAL   Past Surgical History  Procedure Laterality Date  . Colon biopsy    . Tonsillectomy    . Dilation and evacuation  08/21/2012    Procedure: DILATATION AND EVACUATION;  Surgeon: Logan Bores, MD;  Location: [facility];  Service: Gynecology;  Laterality: N/A;  . Cesarean section N/A 12/09/2013    Procedure: Primary Cesarean Section Delivery Baby Girl @ 1960, Apgars 8/8;  Surgeon: Logan Bores, MD;  Location: [facility];  Service: Obstetrics;  Laterality: N/A;  . Colonoscopy  01/19/2011   Family History  Problem Relation Age of Onset  . Anesthesia problems Neg Hx   . Hypotension Neg Hx   . Malignant hyperthermia Neg Hx   . Pseudochol deficiency Neg Hx   . Other Neg Hx   . Alcohol abuse Neg Hx   . Arthritis Neg Hx   . Asthma Neg Hx   . Birth defects Neg Hx   . Cancer Neg Hx   . COPD Neg Hx   . Depression Neg Hx   . Diabetes Neg Hx   . Drug abuse Neg Hx   . Early death Neg Hx   . Hearing loss Neg Hx   . Heart disease Neg Hx   . Hyperlipidemia Neg Hx   . Hypertension Neg Hx   . Kidney disease Neg Hx   . Learning disabilities Neg Hx   . Mental illness Neg Hx   . Mental retardation  Neg Hx   . Miscarriages / Stillbirths Neg Hx   . Stroke Neg Hx   . Vision loss Neg Hx    History  Substance Use Topics  . Smoking status: Former Smoker -- 3 years    Types: Cigarettes    Quit date: 05/16/2010  . Smokeless tobacco: Never Used  . Alcohol Use: No   OB History    Gravida Para Term Preterm AB TAB SAB Ectopic Multiple Living   3 2 2  1  1   2      Review of Systems  Constitutional: Negative.  Negative for fever.  Respiratory: Negative for shortness of breath.   Cardiovascular: Negative for chest pain.  Gastrointestinal: Negative for abdominal pain.  Neurological: Positive for headaches. Negative for tremors, seizures, syncope and weakness.  All other systems reviewed and are negative.   Allergies  Latex  Home Medications   Prior to Admission medications   Medication Sig Start Date End Date Taking? Authorizing Provider  fluticasone (FLONASE) 50 MCG/ACT nasal spray Place 2 sprays into both nostrils at bedtime. 01/12/15   Waldemar Dickens, MD  HYDROcodone-acetaminophen (NORCO) 5-325 MG per tablet Take 1 tablet by mouth every  6 (six) hours as needed for moderate pain. 01/29/15   Melony Overly, MD  ipratropium (ATROVENT) 0.06 % nasal spray Place 2 sprays into both nostrils 4 (four) times daily. 01/12/15   Waldemar Dickens, MD  lidocaine (XYLOCAINE) 2 % solution Use as directed 20 mLs in the mouth or throat as needed for mouth pain. 11/30/14   Hyman Bible, PA-C  mesalamine (LIALDA) 1.2 G EC tablet Take 4 tablets (4.8 g total) by mouth daily with breakfast. 06/03/14   Inda Castle, MD  omeprazole (PRILOSEC) 40 MG capsule Take 1 capsule (40 mg total) by mouth daily. 01/12/15   Waldemar Dickens, MD  traZODone (DESYREL) 50 MG tablet Take 1 tablet (50 mg total) by mouth at bedtime. 02/03/15   Billy Fischer, MD   BP 111/76 mmHg  Pulse 83  Temp(Src) 98 F (36.7 C) (Oral)  Resp 18  SpO2 99% Physical Exam  Constitutional: She is oriented to person, place, and time. She appears  well-developed and well-nourished. No distress.  HENT:  Head: Normocephalic.  Eyes: Conjunctivae are normal. Pupils are equal, round, and reactive to light.  Neck: Normal range of motion. Neck supple.  Cardiovascular: Normal heart sounds.   Pulmonary/Chest: Breath sounds normal.  Lymphadenopathy:    She has no cervical adenopathy.  Neurological: She is alert and oriented to person, place, and time.  Skin: Skin is warm and dry.  Nursing note and vitals reviewed.   ED Course  Procedures (including critical care time) Labs Review Labs Reviewed - No data to display  Imaging Review No results found.   MDM   1. Insomnia, persistent        Billy Fischer, MD 02/09/15 [phone]

## 2015-03-02 ENCOUNTER — Encounter (HOSPITAL_COMMUNITY): Admission: RE | Admit: 2015-03-02 | Payer: Medicaid Other | Source: Ambulatory Visit

## 2015-03-07 ENCOUNTER — Emergency Department (HOSPITAL_COMMUNITY)
Admission: EM | Admit: 2015-03-07 | Discharge: 2015-03-07 | Disposition: A | Discharge status: Home / Self Care | Payer: Medicaid Other | Attending: Emergency Medicine | Admitting: Emergency Medicine

## 2015-03-07 ENCOUNTER — Encounter (HOSPITAL_COMMUNITY): Payer: Self-pay | Admitting: *Deleted

## 2015-03-07 DIAGNOSIS — Z79899 Other long term (current) drug therapy: Secondary | ICD-10-CM | POA: Diagnosis not present

## 2015-03-07 DIAGNOSIS — K029 Dental caries, unspecified: Secondary | ICD-10-CM | POA: Insufficient documentation

## 2015-03-07 DIAGNOSIS — K088 Other specified disorders of teeth and supporting structures: Secondary | ICD-10-CM | POA: Insufficient documentation

## 2015-03-07 DIAGNOSIS — Z862 Personal history of diseases of the blood and blood-forming organs and certain disorders involving the immune mechanism: Secondary | ICD-10-CM | POA: Diagnosis not present

## 2015-03-07 DIAGNOSIS — Z7951 Long term (current) use of inhaled steroids: Secondary | ICD-10-CM | POA: Diagnosis not present

## 2015-03-07 DIAGNOSIS — Z91040 Latex allergy status: Secondary | ICD-10-CM | POA: Diagnosis not present

## 2015-03-07 DIAGNOSIS — Z87891 Personal history of nicotine dependence: Secondary | ICD-10-CM | POA: Insufficient documentation

## 2015-03-07 DIAGNOSIS — K0889 Other specified disorders of teeth and supporting structures: Secondary | ICD-10-CM

## 2015-03-07 MED ORDER — NAPROXEN 500 MG PO TABS: 500.0000 mg | ORAL_TABLET | Freq: Two times a day (BID) | ORAL | Status: DC

## 2015-03-07 MED ORDER — TRAMADOL HCL 50 MG PO TABS: 50.0000 mg | ORAL_TABLET | Freq: Four times a day (QID) | ORAL | Status: DC | PRN

## 2015-03-07 MED ORDER — PENICILLIN V POTASSIUM 500 MG PO TABS: 500.0000 mg | ORAL_TABLET | Freq: Four times a day (QID) | ORAL | Status: AC

## 2015-03-07 MED ORDER — BUPIVACAINE-EPINEPHRINE (PF) 0.5% -1:200000 IJ SOLN
1.8000 mL | Freq: Once | INTRAMUSCULAR | Status: AC
  Administered 2015-03-07: 1.8 mL
  Filled 2015-03-07: qty 1.8

## 2015-03-07 NOTE — Discharge Instructions (Signed)
Please follow the directions provided. Is very important free to follow-up with the dentist this week to further treat this tooth pain. Please take the antibiotics until they're all gone. Please take the naproxen twice a day to help with pain. You may take the tramadol for pain not relieved by the naproxen. Don't hesitate to return for any new, worsening, or concerning symptoms.  SEEK IMMEDIATE MEDICAL CARE IF:  You have a fever.  You develop redness and swelling of your face, jaw, or neck.  You are unable to open your mouth.  You have severe pain uncontrolled by pain medicine.

## 2015-03-07 NOTE — ED Provider Notes (Signed)
CSN: [id number]     Arrival date & time 03/07/15  3532 History   None    No chief complaint on file.  (Consider location/radiation/quality/duration/timing/severity/associated sxs/prior Treatment) HPI  Joyce Solomon is a 29 year old female presenting with dental pain. She states this is an ongoing problem for the last 3 months, however it worsened one month ago when the tooth cracked. She states the pain is intermittent in this most recent episode began 2 or 3 days ago. She describes the pain as throbbing and rates it as a 9/ 10. She denies any fevers, chills, nausea, vomiting, difficult swallowing or headache.  Past Medical History  Diagnosis Date  . Anemia   . Chronic ulcerative colitis   . Headache(784.0)   . Blood transfusion without reported diagnosis     "year ago" b/c of anemia & UC  . Allergy     SEASONAL   Past Surgical History  Procedure Laterality Date  . Colon biopsy    . Tonsillectomy    . Dilation and evacuation  08/21/2012    Procedure: DILATATION AND EVACUATION;  Surgeon: Logan Bores, MD;  Location: [facility];  Service: Gynecology;  Laterality: N/A;  . Cesarean section N/A 12/09/2013    Procedure: Primary Cesarean Section Delivery Baby Girl @ 1959, Apgars 8/8;  Surgeon: Logan Bores, MD;  Location: [facility];  Service: Obstetrics;  Laterality: N/A;  . Colonoscopy  01/19/2011   Family History  Problem Relation Age of Onset  . Anesthesia problems Neg Hx   . Hypotension Neg Hx   . Malignant hyperthermia Neg Hx   . Pseudochol deficiency Neg Hx   . Other Neg Hx   . Alcohol abuse Neg Hx   . Arthritis Neg Hx   . Asthma Neg Hx   . Birth defects Neg Hx   . Cancer Neg Hx   . COPD Neg Hx   . Depression Neg Hx   . Diabetes Neg Hx   . Drug abuse Neg Hx   . Early death Neg Hx   . Hearing loss Neg Hx   . Heart disease Neg Hx   . Hyperlipidemia Neg Hx   . Hypertension Neg Hx   . Kidney disease Neg Hx   . Learning disabilities Neg Hx   . Mental illness Neg  Hx   . Mental retardation Neg Hx   . Miscarriages / Stillbirths Neg Hx   . Stroke Neg Hx   . Vision loss Neg Hx    History  Substance Use Topics  . Smoking status: Former Smoker -- 3 years    Types: Cigarettes    Quit date: 05/16/2010  . Smokeless tobacco: Never Used  . Alcohol Use: No   OB History    Gravida Para Term Preterm AB TAB SAB Ectopic Multiple Living   3 2 2  1  1   2      Review of Systems  Constitutional: Negative for fever.  HENT: Positive for dental problem. Negative for facial swelling and trouble swallowing.   Gastrointestinal: Negative for nausea and vomiting.      Allergies  Latex  Home Medications   Prior to Admission medications   Medication Sig Start Date End Date Taking? Authorizing Provider  fluticasone (FLONASE) 50 MCG/ACT nasal spray Place 2 sprays into both nostrils at bedtime. 01/12/15   Waldemar Dickens, MD  HYDROcodone-acetaminophen (NORCO) 5-325 MG per tablet Take 1 tablet by mouth every 6 (six) hours as needed for moderate pain. 01/29/15  Melony Overly, MD  ipratropium (ATROVENT) 0.06 % nasal spray Place 2 sprays into both nostrils 4 (four) times daily. 01/12/15   Waldemar Dickens, MD  lidocaine (XYLOCAINE) 2 % solution Use as directed 20 mLs in the mouth or throat as needed for mouth pain. 11/30/14   Hyman Bible, PA-C  mesalamine (LIALDA) 1.2 G EC tablet Take 4 tablets (4.8 g total) by mouth daily with breakfast. 06/03/14   Inda Castle, MD  omeprazole (PRILOSEC) 40 MG capsule Take 1 capsule (40 mg total) by mouth daily. 01/12/15   Waldemar Dickens, MD  traZODone (DESYREL) 50 MG tablet Take 1 tablet (50 mg total) by mouth at bedtime. 02/03/15   Billy Fischer, MD   There were no vitals taken for this visit. Physical Exam  Constitutional: She is oriented to person, place, and time. She appears well-developed and well-nourished. No distress.  HENT:  Head: Normocephalic and atraumatic.  Mouth/Throat: No trismus in the jaw. Dental caries present.  No dental abscesses or uvula swelling.  Fracture noted to anterior aspect of left, lower 2nd molar, no pulp or nerve root noted.  Eyes: Conjunctivae are normal.  Neck: Normal range of motion. No tracheal deviation present.  Cardiovascular: Regular rhythm.   Pulmonary/Chest: Effort normal. No respiratory distress.  Neurological: She is alert and oriented to person, place, and time.  Skin: Skin is warm and dry. She is not diaphoretic.  Nursing note and vitals reviewed.   ED Course  Procedures (including critical care time) NERVE BLOCK Performed by: Britt Bottom Consent: Verbal consent obtained. Required items: required blood products, implants, devices, and special equipment available Time out: Immediately prior to procedure a "time out" was called to verify the correct patient, procedure, equipment, support staff and site/side marked as required.  Indication: dental pain Nerve block body site: left inferior alveolar nerve  Preparation: Patient was prepped and draped in the usual sterile fashion. Needle gauge: 73 G Location technique: anatomical landmarks  Local anesthetic: bupivicaine with epi  Anesthetic total: 1.8 ml  Outcome: pain improved Patient tolerance: Patient tolerated the procedure well with no immediate complications.  Labs Review Labs Reviewed - No data to display  Imaging Review No results found.   EKG Interpretation None      MDM   Final diagnoses:  Pain, dental   29 yo with toothache but no gross abscess or indication of  Ludwig's angina or spread of infection.  Pain resolved after dental block.  Prescriptions for NSAIDs, ultram, and pcn provided, along with resources to follow-up with dentist for definitive treatment. Pt declined having her bp measured. She is well-appearing, in no acute distress and all other vital signs reviewed and not concerning. She appears safe to be discharged.  Return precautions provided.     Filed Vitals:    03/07/15 0844  Pulse: 91  Temp: 98.6 F (37 C)  TempSrc: Oral  Resp: 18  Height: 4' 11"  (1.499 m)  Weight: 125 lb (56.7 kg)  SpO2: 100%   Meds given in ED:  Medications  bupivacaine-epinephrine (MARCAINE W/ EPI) 0.5% -1:200000 injection 1.8 mL (1.8 mLs Infiltration Given 03/07/15 0858)    New Prescriptions   NAPROXEN (NAPROSYN) 500 MG TABLET    Take 1 tablet (500 mg total) by mouth 2 (two) times daily.   PENICILLIN V POTASSIUM (VEETID) 500 MG TABLET    Take 1 tablet (500 mg total) by mouth 4 (four) times daily.   TRAMADOL (ULTRAM) 50 MG TABLET  Take 1 tablet (50 mg total) by mouth every 6 (six) hours as needed.       Britt Bottom, NP 03/07/15 2182  Evelina Bucy, MD 03/07/15 [phone]

## 2015-03-07 NOTE — ED Notes (Signed)
Pt reports LT lower tooth is broken

## 2015-03-07 NOTE — ED Notes (Signed)
Declined W/C at D/C and was escorted to lobby by RN. 

## 2015-03-30 NOTE — H&P (Signed)
L&D Evaluation:  History:  HPI 29 y.o. P1001 AAF at 35.[redacted] weeks gestation who presents with nausea/vomiting and diarrhea x 1 day.  Last meal she kept down was [employer] last night.  States she attempted to eat cereal this morning but could not keep it down.  Has vomited ~ 4 x today. Denies fevers, chills, recent sick contacts. Vomitus is non-bloody, non-bilious.   Presents with nausea/vomiting, diarrhea   Patient's Medical History No Chronic Illness   Patient's Surgical History none   Medications Pre Natal Vitamins   Allergies NKDA   Social History none   Family History Non-Contributory   ROS:  General normal, denies fevers, chills   HEENT normal   CNS normal   GI nausea/vomiting, diarrhea x 1 day, mild abdominal pain   GU normal, denies contractions, vaginal bleeding, leakage of fluids   Resp normal   CV normal   Renal normal   MS normal   Exam:  Vital Signs stable  other  all vitals stable except mild tachycardia noted.   Urine Protein negative dipstick   General mild distress   Mental Status clear   Chest clear   Heart no murmur/gallop/rubs, mild tachycardia   Abdomen gravid, non-tender   Estimated Fetal Weight Average for gestational age   Back no CVAT   Edema no edema   Pelvic deferred   Mebranes Intact   FHT normal rate with no decels   Fetal Heart Rate 155   Ucx absent, mild uterine irritability.   Skin dry, no lesions   Lymph no lymphadenopathy   Impression:  Impression dehydration, reactive NST, gastroenteritis, likely viral.   Plan:  Plan UA, fluids, CBC, Chem 8   Comments CBC, electrolytes wnl, however UA with ketonuria (2+). Given IVF bolus of 1L D5LR. Advised on staying adequately hydrated.  PO challenged after being given Phenergan 25 mg IM x 1 dose and was able to tolerate sprite.  BRAT diet recommended over next several days.  Return for eval if nausea/vomiting/diarrhea has worsened, dizziness, weakness occurs.   Labor precautions given.   Follow Up Appointment already scheduled. in 2 days with [facility] with Dr. Marvel Plan   Electronic Signatures: Augusto Gamble (MD)  (Signed 29-Dec-14 21:13)  Authored: L&D Evaluation   Last Updated: 29-Dec-14 21:13 by Augusto Gamble (MD)

## 2015-04-13 ENCOUNTER — Telehealth: Payer: Self-pay | Admitting: Gastroenterology

## 2015-04-13 NOTE — Telephone Encounter (Signed)
It has been more than 8 weeks on her Remicade. She was a no-show in April. Okay to go full dosage every 8 weeks or does she need to do a build up again?

## 2015-04-14 NOTE — Telephone Encounter (Signed)
Patient is scheduled for 04/22/15

## 2015-04-14 NOTE — Telephone Encounter (Signed)
Continue every 8 weeks

## 2015-04-14 NOTE — Telephone Encounter (Signed)
Left patient a message to call for her appointment at [facility] [phone] this afternoon. Writtewn orders faxed and confirmed the receipt.

## 2015-04-21 ENCOUNTER — Other Ambulatory Visit (HOSPITAL_COMMUNITY): Payer: Self-pay | Admitting: *Deleted

## 2015-04-22 ENCOUNTER — Inpatient Hospital Stay (HOSPITAL_COMMUNITY)
Admission: RE | Admit: 2015-04-22 | Discharge: 2015-04-22 | Disposition: A | Discharge status: Home / Self Care | Payer: Medicaid Other | Source: Ambulatory Visit | Attending: Gastroenterology | Admitting: Gastroenterology

## 2015-06-05 ENCOUNTER — Encounter (HOSPITAL_COMMUNITY): Payer: Self-pay | Admitting: Emergency Medicine

## 2015-06-05 ENCOUNTER — Emergency Department (HOSPITAL_COMMUNITY)
Admission: EM | Admit: 2015-06-05 | Discharge: 2015-06-05 | Disposition: A | Discharge status: Home / Self Care | Payer: Medicaid Other | Attending: Emergency Medicine | Admitting: Emergency Medicine

## 2015-06-05 DIAGNOSIS — R1012 Left upper quadrant pain: Secondary | ICD-10-CM | POA: Insufficient documentation

## 2015-06-05 DIAGNOSIS — Z862 Personal history of diseases of the blood and blood-forming organs and certain disorders involving the immune mechanism: Secondary | ICD-10-CM | POA: Diagnosis not present

## 2015-06-05 DIAGNOSIS — Z87891 Personal history of nicotine dependence: Secondary | ICD-10-CM | POA: Diagnosis not present

## 2015-06-05 DIAGNOSIS — Z3202 Encounter for pregnancy test, result negative: Secondary | ICD-10-CM | POA: Insufficient documentation

## 2015-06-05 DIAGNOSIS — Z7951 Long term (current) use of inhaled steroids: Secondary | ICD-10-CM | POA: Insufficient documentation

## 2015-06-05 DIAGNOSIS — Z91040 Latex allergy status: Secondary | ICD-10-CM | POA: Insufficient documentation

## 2015-06-05 DIAGNOSIS — Z8719 Personal history of other diseases of the digestive system: Secondary | ICD-10-CM | POA: Insufficient documentation

## 2015-06-05 DIAGNOSIS — Z79899 Other long term (current) drug therapy: Secondary | ICD-10-CM | POA: Diagnosis not present

## 2015-06-05 DIAGNOSIS — Z791 Long term (current) use of non-steroidal anti-inflammatories (NSAID): Secondary | ICD-10-CM | POA: Insufficient documentation

## 2015-06-05 LAB — URINALYSIS, ROUTINE W REFLEX MICROSCOPIC
BILIRUBIN URINE: NEGATIVE
Glucose, UA: NEGATIVE mg/dL
HGB URINE DIPSTICK: NEGATIVE
KETONES UR: NEGATIVE mg/dL
Leukocytes, UA: NEGATIVE
Nitrite: NEGATIVE
PROTEIN: NEGATIVE mg/dL
Specific Gravity, Urine: 1.037 — ABNORMAL HIGH (ref 1.005–1.030)
Urobilinogen, UA: 1 mg/dL (ref 0.0–1.0)
pH: 6.5 (ref 5.0–8.0)

## 2015-06-05 LAB — POC URINE PREG, ED: Preg Test, Ur: NEGATIVE

## 2015-06-05 MED ORDER — ACETAMINOPHEN 325 MG PO TABS
650.0000 mg | ORAL_TABLET | Freq: Once | ORAL | Status: AC
  Administered 2015-06-05: 650 mg via ORAL
  Filled 2015-06-05: qty 2

## 2015-06-05 NOTE — ED Notes (Signed)
Pt reports left side abd pain x 2 days that is worse with movement. PT denies N/V/D. PT alert x4. NAD at this time.

## 2015-06-05 NOTE — Discharge Instructions (Signed)
°  SEEK IMMEDIATE MEDICAL ATTENTION IF: The pain does not go away or becomes severe, particularly over the next 8 hours.  A temperature above 100.55F develops.  Repeated vomiting occurs (multiple episodes).  The pain becomes localized to portions of the abdomen. The right side could possibly be appendicitis. In an adult, the left lower portion of the abdomen could be colitis or diverticulitis.  Blood is being passed in stools or vomit (bright red or black tarry stools).  Return also if you develop chest pain, difficulty breathing, dizziness or fainting, or become confused, poorly responsive, or inconsolable.

## 2015-06-05 NOTE — ED Provider Notes (Signed)
CSN: [id number]     Arrival date & time 06/05/15  0100 History   First MD Initiated Contact with Patient 06/05/15 [phone]     Chief Complaint  Patient presents with  . Abdominal Pain    Patient is a 29 y.o. female presenting with abdominal pain. The history is provided by the patient.  Abdominal Pain Pain location:  LUQ Pain quality: aching   Pain radiates to:  Does not radiate Pain severity:  Moderate Onset quality:  Gradual Duration:  1 day Timing:  Intermittent Progression:  Unchanged Chronicity:  New Relieved by: rest. Worsened by:  Movement Associated symptoms: no cough, no diarrhea, no dysuria, no fever, no hematochezia, no vaginal bleeding, no vaginal discharge and no vomiting   Risk factors: has not had multiple surgeries and not pregnant   Pt presents with LUQ pain for past day She reports h/o ulcerative colitis but this pain is different She has no other complaints at this time   Past Medical History  Diagnosis Date  . Anemia   . Chronic ulcerative colitis   . Headache(784.0)   . Blood transfusion without reported diagnosis     "year ago" b/c of anemia & UC  . Allergy     SEASONAL   Past Surgical History  Procedure Laterality Date  . Colon biopsy    . Tonsillectomy    . Dilation and evacuation  08/21/2012    Procedure: DILATATION AND EVACUATION;  Surgeon: Logan Bores, MD;  Location: [facility];  Service: Gynecology;  Laterality: N/A;  . Cesarean section N/A 12/09/2013    Procedure: Primary Cesarean Section Delivery Baby Girl @ 1959, Apgars 8/8;  Surgeon: Logan Bores, MD;  Location: [facility];  Service: Obstetrics;  Laterality: N/A;  . Colonoscopy  01/19/2011   Family History  Problem Relation Age of Onset  . Anesthesia problems Neg Hx   . Hypotension Neg Hx   . Malignant hyperthermia Neg Hx   . Pseudochol deficiency Neg Hx   . Other Neg Hx   . Alcohol abuse Neg Hx   . Arthritis Neg Hx   . Asthma Neg Hx   . Birth defects Neg Hx   . Cancer Neg Hx    . COPD Neg Hx   . Depression Neg Hx   . Diabetes Neg Hx   . Drug abuse Neg Hx   . Early death Neg Hx   . Hearing loss Neg Hx   . Heart disease Neg Hx   . Hyperlipidemia Neg Hx   . Hypertension Neg Hx   . Kidney disease Neg Hx   . Learning disabilities Neg Hx   . Mental illness Neg Hx   . Mental retardation Neg Hx   . Miscarriages / Stillbirths Neg Hx   . Stroke Neg Hx   . Vision loss Neg Hx    History  Substance Use Topics  . Smoking status: Former Smoker -- 3 years    Types: Cigarettes    Quit date: 05/16/2010  . Smokeless tobacco: Never Used  . Alcohol Use: No   OB History    Gravida Para Term Preterm AB TAB SAB Ectopic Multiple Living   3 2 2  1  1   2      Review of Systems  Constitutional: Negative for fever.  Respiratory: Negative for cough.   Gastrointestinal: Positive for abdominal pain. Negative for vomiting, diarrhea and hematochezia.  Genitourinary: Negative for dysuria, vaginal bleeding and vaginal discharge.  All other systems reviewed  and are negative.     Allergies  Latex  Home Medications   Prior to Admission medications   Medication Sig Start Date End Date Taking? Authorizing Provider  fluticasone (FLONASE) 50 MCG/ACT nasal spray Place 2 sprays into both nostrils at bedtime. 01/12/15   Waldemar Dickens, MD  ipratropium (ATROVENT) 0.06 % nasal spray Place 2 sprays into both nostrils 4 (four) times daily. 01/12/15   Waldemar Dickens, MD  lidocaine (XYLOCAINE) 2 % solution Use as directed 20 mLs in the mouth or throat as needed for mouth pain. 11/30/14   Hyman Bible, PA-C  mesalamine (LIALDA) 1.2 G EC tablet Take 4 tablets (4.8 g total) by mouth daily with breakfast. 06/03/14   Inda Castle, MD  naproxen (NAPROSYN) 500 MG tablet Take 1 tablet (500 mg total) by mouth 2 (two) times daily. 03/07/15   Britt Bottom, NP  omeprazole (PRILOSEC) 40 MG capsule Take 1 capsule (40 mg total) by mouth daily. 01/12/15   Waldemar Dickens, MD  traZODone  (DESYREL) 50 MG tablet Take 1 tablet (50 mg total) by mouth at bedtime. 02/03/15   Billy Fischer, MD   BP 122/76 mmHg  Pulse 74  Temp(Src) 98.1 F (36.7 C) (Oral)  Resp 20  Ht 4' 11"  (1.499 m)  Wt 120 lb (54.432 kg)  BMI 24.22 kg/m2  SpO2 99%  LMP 05/21/2015 (Approximate) Physical Exam CONSTITUTIONAL: Well developed/well nourished HEAD: Normocephalic/atraumatic EYES: EOMI/PERRL ENMT: Mucous membranes moist NECK: supple no meningeal signs SPINE/BACK:entire spine nontender CV: S1/S2 noted, no murmurs/rubs/gallops noted LUNGS: Lungs are clear to auscultation bilaterally, no apparent distress ABDOMEN: soft, nontender, no rebound or guarding, bowel sounds noted throughout abdomen GU:no cva tenderness NEURO: Pt is awake/alert/appropriate, moves all extremitiesx4.  No facial droop.   EXTREMITIES: pulses normal/equal, full ROM SKIN: warm, color normal PSYCH: no abnormalities of mood noted, alert and oriented to situation  ED Course  Procedures  Medications  acetaminophen (TYLENOL) tablet 650 mg (650 mg Oral Given 06/05/15 0736)    Labs Review Labs Reviewed  URINALYSIS, ROUTINE W REFLEX MICROSCOPIC (NOT AT [facility]) - Abnormal; Notable for the following:    APPearance HAZY (*)    Specific Gravity, Urine 1.037 (*)    All other components within normal limits  POC URINE PREG, ED   8:00 AM Pt well appearing She is using phone, no distress noted No focal tenderness on exam (she points to LUQ as source of pain) She denies nausea/vomiting/fever We had long discussion about further workup I gave options of monitoring/observation in ED/pain meds/labs or going home and returning if she has any return of pain.  I don't feel she requires emergent imaging at this time She reports she can return if any pain worsens over next 8 hrs.  I advised if pain should migrate/worsen or associated with nausea/vomiting in next 8 hours she must return immediately and be examined and possibly have CT  scan/labs Pt agreeable with plan    MDM   Final diagnoses:  LUQ abdominal pain    Nursing notes including past medical history and social history reviewed and considered in documentation Previous records reviewed and considered Labs/vital reviewed myself and considered during evaluation     Ripley Fraise, MD 06/05/15 [phone]

## 2015-06-21 DIAGNOSIS — Z91148 Patient's other noncompliance with medication regimen for other reason: Secondary | ICD-10-CM

## 2015-06-21 DIAGNOSIS — Z9114 Patient's other noncompliance with medication regimen: Secondary | ICD-10-CM

## 2015-06-21 HISTORY — DX: Patient's other noncompliance with medication regimen: Z91.14

## 2015-06-21 HISTORY — DX: Patient's other noncompliance with medication regimen for other reason: Z91.148

## 2015-06-30 ENCOUNTER — Telehealth: Payer: Self-pay | Admitting: Gastroenterology

## 2015-06-30 NOTE — Telephone Encounter (Signed)
The patient's last Remicade infusion was 01/05/15. She said she just couldn't go the other times. She is calling with complaints of blood in the stools. She is having 3 stools a day. She is afebrile and does not have any nausea. Please advise

## 2015-06-30 NOTE — Telephone Encounter (Signed)
Got it. She understands. She is past due on her TB screening which I will get taken care of. There is also hepatitis screenings you had ordered at the [facility] in March. Do you want those done as well?

## 2015-06-30 NOTE — Telephone Encounter (Signed)
She can restart Remicade but please advise her that it is  deleterious to miss infusions

## 2015-07-01 ENCOUNTER — Ambulatory Visit (INDEPENDENT_AMBULATORY_CARE_PROVIDER_SITE_OTHER): Payer: Medicaid Other | Admitting: Gastroenterology

## 2015-07-01 ENCOUNTER — Other Ambulatory Visit (INDEPENDENT_AMBULATORY_CARE_PROVIDER_SITE_OTHER): Payer: Medicaid Other

## 2015-07-01 ENCOUNTER — Encounter: Payer: Self-pay | Admitting: Gastroenterology

## 2015-07-01 VITALS — BP 92/70 | HR 80 | Ht 59.0 in | Wt 132.6 lb

## 2015-07-01 DIAGNOSIS — K5190 Ulcerative colitis, unspecified, without complications: Secondary | ICD-10-CM | POA: Diagnosis not present

## 2015-07-01 LAB — CBC WITH DIFFERENTIAL/PLATELET
BASOS PCT: 0.3 % (ref 0.0–3.0)
Basophils Absolute: 0 10*3/uL (ref 0.0–0.1)
EOS ABS: 0.3 10*3/uL (ref 0.0–0.7)
EOS PCT: 2.7 % (ref 0.0–5.0)
HCT: 44.6 % (ref 36.0–46.0)
Hemoglobin: 14.9 g/dL (ref 12.0–15.0)
Lymphocytes Relative: 35.5 % (ref 12.0–46.0)
Lymphs Abs: 3.3 10*3/uL (ref 0.7–4.0)
MCHC: 33.5 g/dL (ref 30.0–36.0)
MCV: 91.8 fl (ref 78.0–100.0)
MONOS PCT: 8.9 % (ref 3.0–12.0)
Monocytes Absolute: 0.8 10*3/uL (ref 0.1–1.0)
NEUTROS PCT: 52.6 % (ref 43.0–77.0)
Neutro Abs: 4.9 10*3/uL (ref 1.4–7.7)
Platelets: 281 10*3/uL (ref 150.0–400.0)
RBC: 4.86 Mil/uL (ref 3.87–5.11)
RDW: 14.6 % (ref 11.5–15.5)
WBC: 9.3 10*3/uL (ref 4.0–10.5)

## 2015-07-01 LAB — COMPREHENSIVE METABOLIC PANEL
ALT: 31 U/L (ref 0–35)
AST: 19 U/L (ref 0–37)
Albumin: 4.3 g/dL (ref 3.5–5.2)
Alkaline Phosphatase: 83 U/L (ref 39–117)
BUN: 7 mg/dL (ref 6–23)
CO2: 26 mEq/L (ref 19–32)
Calcium: 9.5 mg/dL (ref 8.4–10.5)
Chloride: 106 mEq/L (ref 96–112)
Creatinine, Ser: 0.58 mg/dL (ref 0.40–1.20)
GFR: 157.39 mL/min (ref >60.00)
Glucose, Bld: 82 mg/dL (ref 70–99)
Potassium: 3.8 mEq/L (ref 3.5–5.1)
Sodium: 140 mEq/L (ref 135–145)
Total Bilirubin: 0.3 mg/dL (ref 0.2–1.2)
Total Protein: 7.5 g/dL (ref 6.0–8.3)

## 2015-07-01 MED ORDER — INFLIXIMAB 100 MG IV SOLR: INTRAVENOUS | Status: DC

## 2015-07-01 NOTE — Telephone Encounter (Signed)
Patient advised to come get her labs drawn.

## 2015-07-01 NOTE — Progress Notes (Signed)
      History of Present Illness:  Joyce Solomon has had recurrent lower abdominal pain, diarrhea with bleeding over the past 2 weeks.  She is not taking Remicade for at least 6 months.  When asked about Azulfidine she equivocates somewhat about whether she is taking it or not.  She has not been on antibiotics.    Review of Systems: Pertinent positive and negative review of systems were noted in the above HPI section. All other review of systems were otherwise negative.    Current Medications, Allergies, Past Medical History, Past Surgical History, Family History and Social History were reviewed in [place] record  Vital signs were reviewed in today's medical record. Physical Exam: General: Well developed , well nourished, no acute distress Skin: anicteric Head: Normocephalic and atraumatic Eyes:  sclerae anicteric, EOMI Ears: Normal auditory acuity Mouth: No deformity or lesions Lymph Nodes: no lymphadenopathy Lungs: Clear throughout to auscultation Heart: Regular rate and rhythm; no murmurs, rubs or brui: Gastroinestinal:  Soft,  and non distended. No masses, hepatosplenomegaly or hernias noted. Normal Bowel sounds.  There is mild right lower quadrant tenderness Rectal:deferred Musculoskeletal: Symmetrical with no gross deformities  Pulses:  Normal pulses noted Extremities: No clubbing, cyanosis, edema or deformities noted Neurological: Alert oriented x 4, grossly nonfocal Psychological:  Alert and cooperative. Normal mood and affect  See Assessment and Plan under Problem List

## 2015-07-01 NOTE — Telephone Encounter (Signed)
Patient walked into the office stating that she was here to get labs drawn. I did direct her downstairs to the lab but patient states that she is having a colitis flare up and is requesting prednisone. Best # [phone]. [facility] on [address].

## 2015-07-01 NOTE — Assessment & Plan Note (Addendum)
Patient is having a flareup of colitis.  Medicine compliance is an issue.  I'm unsure whether she is taking Azulfidine.  She is been off Remicade for 6 months on her own volution  Recommendations #1 resume Remicade infusions beginning at a starting dose #2 continue Azulfidine #3 check CBC and comprehensive metabolic profile

## 2015-07-01 NOTE — Telephone Encounter (Signed)
Yes

## 2015-07-01 NOTE — Patient Instructions (Signed)
Joyce Solomon will contact you about your Remicade appointment We will renew your medications

## 2015-07-01 NOTE — Telephone Encounter (Signed)
Appointment today

## 2015-07-02 LAB — HEPATITIS C ANTIBODY: HCV AB: NEGATIVE

## 2015-07-02 LAB — HEPATITIS B SURFACE ANTIBODY,QUALITATIVE: HEP B S AB: POSITIVE — AB

## 2015-07-02 LAB — HEPATITIS A ANTIBODY, IGM: Hep A IgM: NONREACTIVE

## 2015-07-02 LAB — HEPATITIS B SURFACE ANTIGEN: Hepatitis B Surface Ag: NEGATIVE

## 2015-07-02 LAB — HEPATITIS A ANTIBODY, TOTAL: Hep A Total Ab: NONREACTIVE

## 2015-07-04 LAB — QUANTIFERON TB GOLD ASSAY (BLOOD)
INTERFERON GAMMA RELEASE ASSAY: NEGATIVE
Mitogen value: >10 IU/mL
Quantiferon Nil Value: 0.04 IU/mL
Quantiferon Tb Ag Minus Nil Value: 0.01 IU/mL
TB AG VALUE: 0.05 [IU]/mL

## 2015-07-05 ENCOUNTER — Telehealth: Payer: Self-pay | Admitting: Gastroenterology

## 2015-07-05 ENCOUNTER — Other Ambulatory Visit: Payer: Self-pay

## 2015-07-05 DIAGNOSIS — K5190 Ulcerative colitis, unspecified, without complications: Secondary | ICD-10-CM

## 2015-07-05 NOTE — Telephone Encounter (Signed)
Scheduled for her first dose on 07/08/15 at 9:00 am. Advised by Zacarias Pontes that if the patient is a no-show again, they would not schedule her again. Patient advised of this also.

## 2015-07-07 ENCOUNTER — Other Ambulatory Visit (HOSPITAL_COMMUNITY): Payer: Self-pay | Admitting: *Deleted

## 2015-07-08 ENCOUNTER — Encounter (HOSPITAL_COMMUNITY)
Admission: RE | Admit: 2015-07-08 | Discharge: 2015-07-08 | Disposition: A | Discharge status: Home / Self Care | Payer: Medicaid Other | Source: Ambulatory Visit | Attending: Gastroenterology | Admitting: Gastroenterology

## 2015-07-08 ENCOUNTER — Other Ambulatory Visit: Payer: Self-pay

## 2015-07-08 DIAGNOSIS — K5190 Ulcerative colitis, unspecified, without complications: Secondary | ICD-10-CM

## 2015-07-08 MED ORDER — SODIUM CHLORIDE 0.9 % IV SOLN
5.0000 mg/kg | INTRAVENOUS | Status: DC
  Administered 2015-07-08: 300 mg via INTRAVENOUS
  Filled 2015-07-08: qty 30

## 2015-07-08 MED ORDER — ACETAMINOPHEN 325 MG PO TABS
650.0000 mg | ORAL_TABLET | ORAL | Status: DC
  Administered 2015-07-08: 650 mg via ORAL

## 2015-07-08 MED ORDER — ACETAMINOPHEN 325 MG PO TABS: 650.0000 mg | ORAL_TABLET | ORAL | Status: DC

## 2015-07-08 MED ORDER — DIPHENHYDRAMINE HCL 25 MG PO CAPS
50.0000 mg | ORAL_CAPSULE | ORAL | Status: DC
  Administered 2015-07-08: 50 mg via ORAL

## 2015-07-08 MED ORDER — DIPHENHYDRAMINE HCL 25 MG PO CAPS
ORAL_CAPSULE | ORAL | Status: AC
  Administered 2015-07-08: 50 mg via ORAL
  Filled 2015-07-08: qty 2

## 2015-07-08 MED ORDER — SODIUM CHLORIDE 0.9 % IV SOLN
INTRAVENOUS | Status: DC
Start: 2015-07-08 — End: 2015-07-09
  Administered 2015-07-08: 09:00:00 via INTRAVENOUS

## 2015-07-08 MED ORDER — DIPHENHYDRAMINE HCL 50 MG PO CAPS: 50.0000 mg | ORAL_CAPSULE | ORAL | Status: DC

## 2015-07-08 MED ORDER — INFLIXIMAB 100 MG IV SOLR: 5.0000 mg/kg | INTRAVENOUS | Status: DC

## 2015-07-08 MED ORDER — ACETAMINOPHEN 325 MG PO TABS
ORAL_TABLET | ORAL | Status: AC
  Administered 2015-07-08: 650 mg via ORAL
  Filled 2015-07-08: qty 2

## 2015-07-09 ENCOUNTER — Encounter (HOSPITAL_COMMUNITY): Payer: Self-pay | Admitting: *Deleted

## 2015-07-09 ENCOUNTER — Emergency Department (HOSPITAL_COMMUNITY)
Admission: EM | Admit: 2015-07-09 | Discharge: 2015-07-09 | Disposition: A | Discharge status: Home / Self Care | Payer: Medicaid Other | Attending: Emergency Medicine | Admitting: Emergency Medicine

## 2015-07-09 DIAGNOSIS — Z79899 Other long term (current) drug therapy: Secondary | ICD-10-CM | POA: Insufficient documentation

## 2015-07-09 DIAGNOSIS — M791 Myalgia, unspecified site: Secondary | ICD-10-CM

## 2015-07-09 DIAGNOSIS — Z792 Long term (current) use of antibiotics: Secondary | ICD-10-CM | POA: Insufficient documentation

## 2015-07-09 DIAGNOSIS — Z91040 Latex allergy status: Secondary | ICD-10-CM | POA: Insufficient documentation

## 2015-07-09 DIAGNOSIS — Z87891 Personal history of nicotine dependence: Secondary | ICD-10-CM | POA: Diagnosis not present

## 2015-07-09 DIAGNOSIS — Z8719 Personal history of other diseases of the digestive system: Secondary | ICD-10-CM | POA: Insufficient documentation

## 2015-07-09 DIAGNOSIS — M79642 Pain in left hand: Secondary | ICD-10-CM | POA: Diagnosis present

## 2015-07-09 LAB — CBC WITH DIFFERENTIAL/PLATELET
BASOS ABS: 0 10*3/uL (ref 0.0–0.1)
Basophils Relative: 0 % (ref 0–1)
EOS PCT: 4 % (ref 0–5)
Eosinophils Absolute: 0.3 10*3/uL (ref 0.0–0.7)
HEMATOCRIT: 40.5 % (ref 36.0–46.0)
Hemoglobin: 13.8 g/dL (ref 12.0–15.0)
LYMPHS PCT: 40 % (ref 12–46)
Lymphs Abs: 3.4 10*3/uL (ref 0.7–4.0)
MCH: 31.2 pg (ref 26.0–34.0)
MCHC: 34.1 g/dL (ref 30.0–36.0)
MCV: 91.6 fL (ref 78.0–100.0)
Monocytes Absolute: 1.4 10*3/uL — ABNORMAL HIGH (ref 0.1–1.0)
Monocytes Relative: 16 % — ABNORMAL HIGH (ref 3–12)
NEUTROS ABS: 3.4 10*3/uL (ref 1.7–7.7)
Neutrophils Relative %: 40 % — ABNORMAL LOW (ref 43–77)
PLATELETS: 266 10*3/uL (ref 150–400)
RBC: 4.42 MIL/uL (ref 3.87–5.11)
RDW: 14.2 % (ref 11.5–15.5)
WBC: 8.5 10*3/uL (ref 4.0–10.5)

## 2015-07-09 LAB — I-STAT CHEM 8, ED
BUN: 11 mg/dL (ref 6–20)
CHLORIDE: 105 mmol/L (ref 101–111)
Calcium, Ion: 1.23 mmol/L (ref 1.12–1.23)
Creatinine, Ser: 0.6 mg/dL (ref 0.44–1.00)
GLUCOSE: 89 mg/dL (ref 65–99)
HEMATOCRIT: 45 % (ref 36.0–46.0)
Hemoglobin: 15.3 g/dL — ABNORMAL HIGH (ref 12.0–15.0)
POTASSIUM: 3.8 mmol/L (ref 3.5–5.1)
Sodium: 140 mmol/L (ref 135–145)
TCO2: 21 mmol/L (ref 0–100)

## 2015-07-09 LAB — CK: CK TOTAL: 121 U/L (ref 38–234)

## 2015-07-09 MED ORDER — TRAMADOL HCL 50 MG PO TABS: 50.0000 mg | ORAL_TABLET | Freq: Four times a day (QID) | ORAL | Status: DC | PRN

## 2015-07-09 MED ORDER — KETOROLAC TROMETHAMINE 60 MG/2ML IM SOLN
30.0000 mg | Freq: Once | INTRAMUSCULAR | Status: AC
  Administered 2015-07-09: 30 mg via INTRAMUSCULAR
  Filled 2015-07-09: qty 2

## 2015-07-09 MED ORDER — OXYCODONE-ACETAMINOPHEN 5-325 MG PO TABS
1.0000 | ORAL_TABLET | Freq: Once | ORAL | Status: DC
  Filled 2015-07-09: qty 1

## 2015-07-09 NOTE — ED Provider Notes (Signed)
CSN: [id number]     Arrival date & time 07/09/15  0840 History   First MD Initiated Contact with Patient 07/09/15 [phone]     Chief Complaint  Patient presents with  . Extremity Pain      (Consider location/radiation/quality/duration/timing/severity/associated sxs/prior Treatment) HPI Joyce Solomon is a 29 y.o. female with hx of anemia, ulcerative colitis, headaches, presents to emergency department complaining of pain in bilateral hands and lower legs. Patient states pain started last night. Patient reports just restarting Remicade yesterday. She states she has been on it before with no side effects. She denies any other changes in medications. She denies any strenuous activity yesterday. She denies any fever or chills. Denies any head or neck injuries. She denies any numbness or weakness in extremities. States pain comes and goes in severity but dull constantly. Denies any swelling. Denies history of similar symptoms in the past. She took a muscle relaxant this morning which did not help.  Past Medical History  Diagnosis Date  . Anemia   . Chronic ulcerative colitis   . Headache(784.0)   . Blood transfusion without reported diagnosis     "year ago" b/c of anemia & UC  . Allergy     SEASONAL   Past Surgical History  Procedure Laterality Date  . Colon biopsy    . Tonsillectomy    . Dilation and evacuation  08/21/2012    Procedure: DILATATION AND EVACUATION;  Surgeon: Logan Bores, MD;  Location: [facility];  Service: Gynecology;  Laterality: N/A;  . Cesarean section N/A 12/09/2013    Procedure: Primary Cesarean Section Delivery Baby Girl @ 1959, Apgars 8/8;  Surgeon: Logan Bores, MD;  Location: [facility];  Service: Obstetrics;  Laterality: N/A;  . Colonoscopy  01/19/2011   Family History  Problem Relation Age of Onset  . Anesthesia problems Neg Hx   . Hypotension Neg Hx   . Malignant hyperthermia Neg Hx   . Pseudochol deficiency Neg Hx   . Other Neg Hx   . Alcohol abuse  Neg Hx   . Arthritis Neg Hx   . Asthma Neg Hx   . Birth defects Neg Hx   . Cancer Neg Hx   . COPD Neg Hx   . Depression Neg Hx   . Diabetes Neg Hx   . Drug abuse Neg Hx   . Early death Neg Hx   . Hearing loss Neg Hx   . Heart disease Neg Hx   . Hyperlipidemia Neg Hx   . Hypertension Neg Hx   . Kidney disease Neg Hx   . Learning disabilities Neg Hx   . Mental illness Neg Hx   . Mental retardation Neg Hx   . Miscarriages / Stillbirths Neg Hx   . Stroke Neg Hx   . Vision loss Neg Hx    Social History  Substance Use Topics  . Smoking status: Former Smoker -- 3 years    Types: Cigarettes    Quit date: 05/16/2010  . Smokeless tobacco: Never Used  . Alcohol Use: No   OB History    Gravida Para Term Preterm AB TAB SAB Ectopic Multiple Living   3 2 2  1  1   2      Review of Systems  Constitutional: Negative for fever and chills.  Respiratory: Negative for cough, chest tightness and shortness of breath.   Cardiovascular: Negative for chest pain, palpitations and leg swelling.  Gastrointestinal: Negative for nausea, vomiting, abdominal pain and diarrhea.  Genitourinary: Negative for dysuria and flank pain.  Musculoskeletal: Positive for myalgias and arthralgias. Negative for neck pain and neck stiffness.  Skin: Negative for rash.  Neurological: Negative for dizziness, weakness and headaches.  All other systems reviewed and are negative.     Allergies  Latex  Home Medications   Prior to Admission medications   Medication Sig Start Date End Date Taking? Authorizing Provider  mesalamine (LIALDA) 1.2 G EC tablet Take 4 tablets (4.8 g total) by mouth daily with breakfast. 06/03/14  Yes Inda Castle, MD  traZODone (DESYREL) 50 MG tablet Take 1 tablet (50 mg total) by mouth at bedtime. 02/03/15  Yes Billy Fischer, MD   BP 126/89 mmHg  Pulse 90  Temp(Src) 98.1 F (36.7 C) (Oral)  Resp 16  SpO2 100%  LMP 06/20/2015 (Approximate) Physical Exam  Constitutional: She  appears well-developed and well-nourished. No distress.  HENT:  Head: Normocephalic.  Eyes: Conjunctivae are normal.  Neck: Neck supple.  Cardiovascular: Normal rate, regular rhythm and normal heart sounds.   Pulmonary/Chest: Effort normal and breath sounds normal. No respiratory distress. She has no wheezes. She has no rales.  Abdominal: Soft. Bowel sounds are normal. She exhibits no distension. There is no tenderness. There is no rebound.  Musculoskeletal: She exhibits no edema.  Normal-appearing bilateral upper and lower extremities. There is no swelling, erythema, rashes. Distal radial pulses intact, hands are warm, cap refill less than 2 seconds distally. Full range of motion of elbow, wrist, hand joints bilaterally. Strength is about 5 out of 5 and equal bilaterally. Sensation intact. Full range of motion of bilateral knees, ankles, toes with no pain. Dorsal pedal pulses intact and equal bilaterally. Feet are warm, cap refill less than 2 seconds distally.  Neurological: She is alert.  Skin: Skin is warm and dry.  Psychiatric: She has a normal mood and affect. Her behavior is normal.  Nursing note and vitals reviewed.   ED Course  Procedures (including critical care time) Labs Review Labs Reviewed  CBC WITH DIFFERENTIAL/PLATELET - Abnormal; Notable for the following:    Neutrophils Relative % 40 (*)    Monocytes Relative 16 (*)    Monocytes Absolute 1.4 (*)    All other components within normal limits  I-STAT CHEM 8, ED - Abnormal; Notable for the following:    Hemoglobin 15.3 (*)    All other components within normal limits  CK    Imaging Review No results found. I have personally reviewed and evaluated these images and lab results as part of my medical decision-making.   EKG Interpretation None      MDM   Final diagnoses:  Myalgia    Patient with nontraumatic pain in bilateral hands and lower legs. No history of the same. Started on Remicade yesterday. I looked up  side effects of Remicade and myalgias is a common side effect. Will get CBC, Chem-8, CK levels. Patient is neurovascularly intact. Toradol and Percocet ordered for pain.   10:38 AM Left with no acute findings. CK level is normal. Patient feels much better. Plan to discharge home, tramadol, follow-up with gastroenterology and primary care doctor.  Filed Vitals:   07/09/15 0844  BP: 126/89  Pulse: 90  Temp: 98.1 F (36.7 C)  TempSrc: Oral  Resp: 16  SpO2: 100%      Jeannett Senior, PA-C 07/09/15 Cor[place], DO 07/11/15 1427

## 2015-07-09 NOTE — Discharge Instructions (Signed)
Ibuprofen or tylenol for pain. Tramadol for severe pain. Follow up with primary care doctor and gastroenterology.   Muscle Pain Muscle pain (myalgia) may be caused by many things, including:  Overuse or muscle strain, especially if you are not in shape. This is the most common cause of muscle pain.  Injury.  Bruises.  Viruses, such as the flu.  Infectious diseases.  Fibromyalgia, which is a chronic condition that causes muscle tenderness, fatigue, and headache.  Autoimmune diseases, including lupus.  Certain drugs, including ACE inhibitors and statins. Muscle pain may be mild or severe. In most cases, the pain lasts only a short time and goes away without treatment. To diagnose the cause of your muscle pain, your health care provider will take your medical history. This means he or she will ask you when your muscle pain began and what has been happening. If you have not had muscle pain for very long, your health care provider may want to wait before doing much testing. If your muscle pain has lasted a long time, your health care provider may want to run tests right away. If your health care provider thinks your muscle pain may be caused by illness, you may need to have additional tests to rule out certain conditions.  Treatment for muscle pain depends on the cause. Home care is often enough to relieve muscle pain. Your health care provider may also prescribe anti-inflammatory medicine. HOME CARE INSTRUCTIONS Watch your condition for any changes. The following actions may help to lessen any discomfort you are feeling:  Only take over-the-counter or prescription medicines as directed by your health care provider.  Apply ice to the sore muscle:  Put ice in a plastic bag.  Place a towel between your skin and the bag.  Leave the ice on for 15-20 minutes, 3-4 times a day.  You may alternate applying hot and cold packs to the muscle as directed by your health care provider.  If overuse  is causing your muscle pain, slow down your activities until the pain goes away.  Remember that it is normal to feel some muscle pain after starting a workout program. Muscles that have not been used often will be sore at first.  Do regular, gentle exercises if you are not usually active.  Warm up before exercising to lower your risk of muscle pain.  Do not continue working out if the pain is very bad. Bad pain could mean you have injured a muscle. SEEK MEDICAL CARE IF:  Your muscle pain gets worse, and medicines do not help.  You have muscle pain that lasts longer than 3 days.  You have a rash or fever along with muscle pain.  You have muscle pain after a tick bite.  You have muscle pain while working out, even though you are in good physical condition.  You have redness, soreness, or swelling along with muscle pain.  You have muscle pain after starting a new medicine or changing the dose of a medicine. SEEK IMMEDIATE MEDICAL CARE IF:  You have trouble breathing.  You have trouble swallowing.  You have muscle pain along with a stiff neck, fever, and vomiting.  You have severe muscle weakness or cannot move part of your body. MAKE SURE YOU:   Understand these instructions.  Will watch your condition.  Will get help right away if you are not doing well or get worse. Document Released: 09/28/2006 Document Revised: 11/11/2013 Document Reviewed: 09/02/2013 [facility] Patient Information 2015 [employer], [place]. This information  is not intended to replace advice given to you by your health care provider. Make sure you discuss any questions you have with your health care provider.

## 2015-07-09 NOTE — ED Notes (Signed)
Pt reports she received IV infusion yesterday for colitis, reports later after the infusion pt started having bilateral pain in arms and lower legs. Pain 10/10. Ambulatory. Denies SOB. Denies abd pain, denies N/V. Denies dysuria. Denies blood in urine, reports a small amount of blood but states that is normal for a colitis flare up. Denies hx of sickle cell.

## 2015-07-14 ENCOUNTER — Other Ambulatory Visit: Payer: Self-pay | Admitting: Gastroenterology

## 2015-07-15 ENCOUNTER — Telehealth: Payer: Self-pay | Admitting: Gastroenterology

## 2015-07-15 NOTE — Telephone Encounter (Signed)
Would try another dose

## 2015-07-15 NOTE — Telephone Encounter (Signed)
Patient notified

## 2015-07-15 NOTE — Telephone Encounter (Signed)
Patient had first remicade infusion on 07/08/15.  On 07/09/15 went to the ER with pains in her hands and lower extremities.  She was prescribed toradol and percocet.  She is still having pain.  Next Remicade is due 08/01/15.  Please advise

## 2015-07-19 ENCOUNTER — Other Ambulatory Visit: Payer: Self-pay

## 2015-07-19 MED ORDER — MESALAMINE 1.2 G PO TBEC: DELAYED_RELEASE_TABLET | ORAL | Status: DC

## 2015-07-20 ENCOUNTER — Other Ambulatory Visit: Payer: Self-pay

## 2015-07-21 ENCOUNTER — Other Ambulatory Visit (HOSPITAL_COMMUNITY): Payer: Self-pay | Admitting: *Deleted

## 2015-07-21 ENCOUNTER — Ambulatory Visit (INDEPENDENT_AMBULATORY_CARE_PROVIDER_SITE_OTHER): Payer: Medicaid Other | Admitting: Gastroenterology

## 2015-07-21 DIAGNOSIS — Z23 Encounter for immunization: Secondary | ICD-10-CM

## 2015-07-21 DIAGNOSIS — K5090 Crohn's disease, unspecified, without complications: Secondary | ICD-10-CM

## 2015-07-22 ENCOUNTER — Encounter (HOSPITAL_COMMUNITY)
Admission: RE | Admit: 2015-07-22 | Discharge: 2015-07-22 | Disposition: A | Discharge status: Home / Self Care | Payer: Medicaid Other | Source: Ambulatory Visit | Attending: Gastroenterology | Admitting: Gastroenterology

## 2015-07-22 VITALS — BP 119/70 | HR 71 | Temp 98.2°F | Resp 20 | Ht 59.0 in | Wt 120.0 lb

## 2015-07-22 DIAGNOSIS — K5190 Ulcerative colitis, unspecified, without complications: Secondary | ICD-10-CM

## 2015-07-22 MED ORDER — DIPHENHYDRAMINE HCL 25 MG PO TABS
50.0000 mg | ORAL_TABLET | Freq: Once | ORAL | Status: AC
  Administered 2015-07-22: 50 mg via ORAL
  Filled 2015-07-22: qty 2

## 2015-07-22 MED ORDER — SODIUM CHLORIDE 0.9 % IV SOLN
5.0000 mg/kg | INTRAVENOUS | Status: DC
  Administered 2015-07-22: 300 mg via INTRAVENOUS
  Filled 2015-07-22: qty 30

## 2015-07-22 MED ORDER — DIPHENHYDRAMINE HCL 50 MG/ML IJ SOLN
50.0000 mg | Freq: Once | INTRAMUSCULAR | Status: AC
  Administered 2015-07-22: 50 mg via INTRAVENOUS

## 2015-07-22 MED ORDER — DIPHENHYDRAMINE HCL 25 MG PO CAPS
ORAL_CAPSULE | ORAL | Status: AC
  Administered 2015-07-22: 50 mg via ORAL
  Filled 2015-07-22: qty 2

## 2015-07-22 MED ORDER — DIPHENHYDRAMINE HCL 50 MG/ML IJ SOLN
INTRAMUSCULAR | Status: AC
  Administered 2015-07-22: 50 mg via INTRAVENOUS
  Filled 2015-07-22: qty 1

## 2015-07-22 MED ORDER — SODIUM CHLORIDE 0.9 % IV SOLN
INTRAVENOUS | Status: DC
  Administered 2015-07-22: 250 mL via INTRAVENOUS

## 2015-07-22 MED ORDER — ACETAMINOPHEN 325 MG PO TABS: 650.0000 mg | ORAL_TABLET | ORAL | Status: DC

## 2015-07-22 NOTE — Progress Notes (Signed)
No further itching; pt d/c home; no c/o

## 2015-07-22 NOTE — Progress Notes (Addendum)
Pt c/o "itching all over" during remicade infusion.  Denies SOB, denies chest pain, no hives or rashes noted.  Dr Deatra Ina notified, give benadryl 11m IV now.  Will continue to monitor

## 2015-07-23 ENCOUNTER — Telehealth: Payer: Self-pay | Admitting: *Deleted

## 2015-07-23 NOTE — Telephone Encounter (Signed)
Sent prior auth through Cover My Meds   Pending

## 2015-07-28 ENCOUNTER — Telehealth: Payer: Self-pay | Admitting: Gastroenterology

## 2015-07-28 NOTE — Telephone Encounter (Signed)
yes

## 2015-07-30 ENCOUNTER — Telehealth: Payer: Self-pay | Admitting: Gastroenterology

## 2015-07-30 ENCOUNTER — Other Ambulatory Visit: Payer: Self-pay

## 2015-07-30 DIAGNOSIS — R1030 Lower abdominal pain, unspecified: Secondary | ICD-10-CM

## 2015-07-30 NOTE — Telephone Encounter (Signed)
She is not seeing any improvement in her symptoms. She is having 3 to 4 bm's a day and with blood in them. She doesn't have any appetite. She has had 2 infusions on the induction schedule. Please advise.

## 2015-07-30 NOTE — Telephone Encounter (Signed)
He needs a stool pathogen panel and stool culture for CMV Continuous infusions

## 2015-07-30 NOTE — Telephone Encounter (Signed)
Can she have something for the abdominal discomfort/pain in the meantime?

## 2015-08-02 ENCOUNTER — Other Ambulatory Visit: Payer: Medicaid Other

## 2015-08-02 ENCOUNTER — Other Ambulatory Visit: Payer: Self-pay

## 2015-08-02 DIAGNOSIS — R1030 Lower abdominal pain, unspecified: Secondary | ICD-10-CM

## 2015-08-02 MED ORDER — DICYCLOMINE HCL 10 MG PO CAPS
10.0000 mg | ORAL_CAPSULE | Freq: Four times a day (QID) | ORAL | Status: DC | PRN
Start: 2015-08-02 — End: 2015-10-21

## 2015-08-02 MED ORDER — HYOSCYAMINE SULFATE ER 0.375 MG PO TBCR: 1.0000 | EXTENDED_RELEASE_TABLET | Freq: Two times a day (BID) | ORAL | Status: DC | PRN

## 2015-08-02 NOTE — Telephone Encounter (Signed)
Hyomax 0.375 mg twice a day when necessary #25 with one renewal

## 2015-08-02 NOTE — Telephone Encounter (Signed)
Anticholinergics are not covered by Medicaid. Per Dr Deatra Ina, Rx changed to Bentyl 10 mg every 6 hours PRN. Pharmacist notified. Spoke with the patient also.

## 2015-08-03 LAB — GASTROINTESTINAL PATHOGEN PANEL PCR
C. difficile Tox A/B, PCR: NEGATIVE
CAMPYLOBACTER, PCR: NEGATIVE
Cryptosporidium, PCR: NEGATIVE
E COLI (STEC) STX1/STX2, PCR: NEGATIVE
E COLI 0157, PCR: NEGATIVE
E coli (ETEC) LT/ST PCR: NEGATIVE
Giardia lamblia, PCR: NEGATIVE
Norovirus, PCR: NEGATIVE
Rotavirus A, PCR: NEGATIVE
SALMONELLA, PCR: NEGATIVE
SHIGELLA, PCR: NEGATIVE

## 2015-08-04 LAB — CYTOMEGALOVIRUS PCR, QUALITATIVE: CMV DNA QL PCR: NOT DETECTED

## 2015-08-06 ENCOUNTER — Telehealth: Payer: Self-pay | Admitting: Gastroenterology

## 2015-08-06 NOTE — Telephone Encounter (Signed)
Begin prednisone 40 mg daily. Patient should call back on Monday (ceased 3 days)

## 2015-08-06 NOTE — Telephone Encounter (Signed)
10 mg tablets, 4 once a day beginning today.  Call back in 3 days.

## 2015-08-06 NOTE — Telephone Encounter (Signed)
Dr Deatra Ina, Is this 1 53m twice daily just until Monday??  Or 2 257monce daily and how long should she take it

## 2015-08-06 NOTE — Telephone Encounter (Signed)
Patient is complaining of Constant diarrhea and abdominal pain and bleeding.   Explained to patient that her stool studies are not back in yet from the lab  Dr Deatra Ina patient wants to know what she should do

## 2015-08-07 ENCOUNTER — Encounter (HOSPITAL_COMMUNITY): Payer: Self-pay

## 2015-08-07 ENCOUNTER — Emergency Department (HOSPITAL_COMMUNITY)
Admission: EM | Admit: 2015-08-07 | Discharge: 2015-08-08 | Disposition: A | Discharge status: Home / Self Care | Payer: Medicaid Other | Attending: Emergency Medicine | Admitting: Emergency Medicine

## 2015-08-07 DIAGNOSIS — Z91040 Latex allergy status: Secondary | ICD-10-CM | POA: Insufficient documentation

## 2015-08-07 DIAGNOSIS — R1033 Periumbilical pain: Secondary | ICD-10-CM | POA: Diagnosis not present

## 2015-08-07 DIAGNOSIS — R109 Unspecified abdominal pain: Secondary | ICD-10-CM | POA: Diagnosis present

## 2015-08-07 DIAGNOSIS — Z87891 Personal history of nicotine dependence: Secondary | ICD-10-CM | POA: Diagnosis not present

## 2015-08-07 DIAGNOSIS — Z79899 Other long term (current) drug therapy: Secondary | ICD-10-CM | POA: Insufficient documentation

## 2015-08-07 DIAGNOSIS — Z3202 Encounter for pregnancy test, result negative: Secondary | ICD-10-CM | POA: Insufficient documentation

## 2015-08-07 DIAGNOSIS — Z862 Personal history of diseases of the blood and blood-forming organs and certain disorders involving the immune mechanism: Secondary | ICD-10-CM | POA: Diagnosis not present

## 2015-08-07 LAB — COMPREHENSIVE METABOLIC PANEL
ALBUMIN: 3.6 g/dL (ref 3.5–5.0)
ALT: 14 U/L (ref 14–54)
ANION GAP: 8 (ref 5–15)
AST: 15 U/L (ref 15–41)
Alkaline Phosphatase: 85 U/L (ref 38–126)
BILIRUBIN TOTAL: 0.3 mg/dL (ref 0.3–1.2)
BUN: 8 mg/dL (ref 6–20)
CHLORIDE: 101 mmol/L (ref 101–111)
CO2: 23 mmol/L (ref 22–32)
Calcium: 9.1 mg/dL (ref 8.9–10.3)
Creatinine, Ser: 0.54 mg/dL (ref 0.44–1.00)
GFR calc Af Amer: >60 mL/min (ref >60)
GLUCOSE: 90 mg/dL (ref 65–99)
POTASSIUM: 3.9 mmol/L (ref 3.5–5.1)
Sodium: 132 mmol/L — ABNORMAL LOW (ref 135–145)
TOTAL PROTEIN: 7.1 g/dL (ref 6.5–8.1)

## 2015-08-07 LAB — CBC WITH DIFFERENTIAL/PLATELET
BASOS ABS: 0 10*3/uL (ref 0.0–0.1)
BASOS PCT: 0 %
EOS PCT: 6 %
Eosinophils Absolute: 0.5 10*3/uL (ref 0.0–0.7)
HEMATOCRIT: 40.1 % (ref 36.0–46.0)
Hemoglobin: 13.6 g/dL (ref 12.0–15.0)
Lymphocytes Relative: 30 %
Lymphs Abs: 3 10*3/uL (ref 0.7–4.0)
MCH: 31 pg (ref 26.0–34.0)
MCHC: 33.9 g/dL (ref 30.0–36.0)
MCV: 91.3 fL (ref 78.0–100.0)
MONO ABS: 1.7 10*3/uL — AB (ref 0.1–1.0)
MONOS PCT: 17 %
NEUTROS ABS: 4.6 10*3/uL (ref 1.7–7.7)
Neutrophils Relative %: 47 %
PLATELETS: 280 10*3/uL (ref 150–400)
RBC: 4.39 MIL/uL (ref 3.87–5.11)
RDW: 14.3 % (ref 11.5–15.5)
WBC: 9.8 10*3/uL (ref 4.0–10.5)

## 2015-08-07 LAB — I-STAT BETA HCG BLOOD, ED (MC, WL, AP ONLY)

## 2015-08-07 MED ORDER — DIPHENHYDRAMINE HCL 25 MG PO CAPS
25.0000 mg | ORAL_CAPSULE | Freq: Once | ORAL | Status: AC
  Administered 2015-08-07: 25 mg via ORAL
  Filled 2015-08-07: qty 1

## 2015-08-07 MED ORDER — PREDNISONE 20 MG PO TABS
40.0000 mg | ORAL_TABLET | Freq: Once | ORAL | Status: AC
  Administered 2015-08-07: 40 mg via ORAL
  Filled 2015-08-07: qty 2

## 2015-08-07 MED ORDER — OXYCODONE-ACETAMINOPHEN 5-325 MG PO TABS
2.0000 | ORAL_TABLET | Freq: Once | ORAL | Status: AC
  Administered 2015-08-07: 2 via ORAL
  Filled 2015-08-07: qty 2

## 2015-08-07 NOTE — ED Provider Notes (Signed)
CSN: [id number]     Arrival date & time 08/07/15  1909 History   First MD Initiated Contact with Patient 08/07/15 2112     Chief Complaint  Patient presents with  . Abdominal Cramping     (Consider location/radiation/quality/duration/timing/severity/associated sxs/prior Treatment) HPI Comments: The patient is a 29 year old female, she has a history of inflammatory bowel disease, likely ulcerative colitis according to the patient. She is currently under the care of Dr. Deatra Ina with gastroenterology. She is currently getting multiple medications for her disease including Remicade which was started a couple of weeks ago. She has also been on dicyclomine and anterior inflammatory is but complains of having ongoing pain and intermittent blood in her stools. She has been seen by the gastroenterologist since this time, she was started on prednisone yesterday. She states the pain comes and goes throughout the day, at this time the pain is mild, she denies nausea vomiting and has been able to eat and drink. She denies pregnancy, denies dysuria.  Patient is a 29 y.o. female presenting with cramps. The history is provided by the patient.  Abdominal Cramping    Past Medical History  Diagnosis Date  . Anemia   . Chronic ulcerative colitis   . Headache(784.0)   . Blood transfusion without reported diagnosis     "year ago" b/c of anemia & UC  . Allergy     SEASONAL   Past Surgical History  Procedure Laterality Date  . Colon biopsy    . Tonsillectomy    . Dilation and evacuation  08/21/2012    Procedure: DILATATION AND EVACUATION;  Surgeon: Logan Bores, MD;  Location: [facility];  Service: Gynecology;  Laterality: N/A;  . Cesarean section N/A 12/09/2013    Procedure: Primary Cesarean Section Delivery Baby Girl @ 1959, Apgars 8/8;  Surgeon: Logan Bores, MD;  Location: [facility];  Service: Obstetrics;  Laterality: N/A;  . Colonoscopy  01/19/2011   Family History  Problem Relation Age of Onset   . Anesthesia problems Neg Hx   . Hypotension Neg Hx   . Malignant hyperthermia Neg Hx   . Pseudochol deficiency Neg Hx   . Other Neg Hx   . Alcohol abuse Neg Hx   . Arthritis Neg Hx   . Asthma Neg Hx   . Birth defects Neg Hx   . Cancer Neg Hx   . COPD Neg Hx   . Depression Neg Hx   . Diabetes Neg Hx   . Drug abuse Neg Hx   . Early death Neg Hx   . Hearing loss Neg Hx   . Heart disease Neg Hx   . Hyperlipidemia Neg Hx   . Hypertension Neg Hx   . Kidney disease Neg Hx   . Learning disabilities Neg Hx   . Mental illness Neg Hx   . Mental retardation Neg Hx   . Miscarriages / Stillbirths Neg Hx   . Stroke Neg Hx   . Vision loss Neg Hx    Social History  Substance Use Topics  . Smoking status: Former Smoker -- 3 years    Types: Cigarettes    Quit date: 05/16/2010  . Smokeless tobacco: Never Used  . Alcohol Use: No   OB History    Gravida Para Term Preterm AB TAB SAB Ectopic Multiple Living   3 2 2  1  1   2      Review of Systems  All other systems reviewed and are negative.  Allergies  Latex and Remicade  Home Medications   Prior to Admission medications   Medication Sig Start Date End Date Taking? Authorizing Provider  dicyclomine (BENTYL) 10 MG capsule Take 1 capsule (10 mg total) by mouth every 6 (six) hours as needed for spasms. 08/02/15  Yes Inda Castle, MD  mesalamine (LIALDA) 1.2 G EC tablet TAKE FOUR TABLETS BY MOUTH DAILY WITH BREAKFAST 07/19/15  Yes Inda Castle, MD  traZODone (DESYREL) 50 MG tablet Take 1 tablet (50 mg total) by mouth at bedtime. Patient taking differently: Take 50 mg by mouth at bedtime as needed for sleep.  02/03/15  Yes Billy Fischer, MD  traMADol (ULTRAM) 50 MG tablet Take 1 tablet (50 mg total) by mouth every 6 (six) hours as needed. 08/08/15   Noemi Chapel, MD   BP 131/69 mmHg  Pulse 90  Temp(Src) 98.4 F (36.9 C) (Oral)  Resp 18  SpO2 98%  LMP 07/18/2015 Physical Exam  Constitutional: She appears well-developed  and well-nourished. No distress.  HENT:  Head: Normocephalic and atraumatic.  Mouth/Throat: Oropharynx is clear and moist. No oropharyngeal exudate.  Eyes: Conjunctivae and EOM are normal. Pupils are equal, round, and reactive to light. Right eye exhibits no discharge. Left eye exhibits no discharge. No scleral icterus.  Neck: Normal range of motion. Neck supple. No JVD present. No thyromegaly present.  Cardiovascular: Normal rate, regular rhythm, normal heart sounds and intact distal pulses.  Exam reveals no gallop and no friction rub.   No murmur heard. Pulmonary/Chest: Effort normal and breath sounds normal. No respiratory distress. She has no wheezes. She has no rales.  Abdominal: Soft. Bowel sounds are normal. She exhibits no distension and no mass. There is tenderness ( Minimal tenderness to the periumbilical and left lower quadrant, no other abdominal tenderness, very soft, no peritoneal signs, no distention, no guarding).  Musculoskeletal: Normal range of motion. She exhibits no edema or tenderness.  Lymphadenopathy:    She has no cervical adenopathy.  Neurological: She is alert. Coordination normal.  Skin: Skin is warm and dry. No rash noted. No erythema.  Psychiatric: She has a normal mood and affect. Her behavior is normal.  Nursing note and vitals reviewed.   ED Course  Procedures (including critical care time) Labs Review Labs Reviewed  CBC WITH DIFFERENTIAL/PLATELET - Abnormal; Notable for the following:    Monocytes Absolute 1.7 (*)    All other components within normal limits  COMPREHENSIVE METABOLIC PANEL - Abnormal; Notable for the following:    Sodium 132 (*)    All other components within normal limits  URINALYSIS, ROUTINE W REFLEX MICROSCOPIC (NOT AT [facility])  POC URINE PREG, ED  I-STAT BETA HCG BLOOD, ED ([facility], [facility], [facility] ONLY)    Imaging Review No results found. I have personally reviewed and evaluated these images and lab results as part of my medical  decision-making.   EKG Interpretation None      MDM   Final diagnoses:  Periumbilical abdominal pain    Exam is unremarkable except for some mild abdominal tenderness, this seems to be consistent over time, that the pain comes and goes makes it much less likely to be a surgical etiology. She is already under the care of a gastroenterologist, we'll check labs, give pain medication, anticipate discharge.  [place] of shift - care signed out to Dr. Randal Buba pending UA - other labs look good.  Noemi Chapel, MD 08/08/15 [phone]

## 2015-08-07 NOTE — ED Notes (Signed)
PT called out reporting itching.

## 2015-08-07 NOTE — ED Notes (Signed)
Pt has been having pain d/t ulcerative colitis.   Gastro MD called in Bentyl last week for cramping but medication is not effective.

## 2015-08-08 LAB — URINALYSIS, ROUTINE W REFLEX MICROSCOPIC
BILIRUBIN URINE: NEGATIVE
Glucose, UA: NEGATIVE mg/dL
HGB URINE DIPSTICK: NEGATIVE
Ketones, ur: NEGATIVE mg/dL
Leukocytes, UA: NEGATIVE
Nitrite: NEGATIVE
PH: 7 (ref 5.0–8.0)
Protein, ur: NEGATIVE mg/dL
SPECIFIC GRAVITY, URINE: 1.018 (ref 1.005–1.030)
Urobilinogen, UA: 0.2 mg/dL (ref 0.0–1.0)

## 2015-08-08 LAB — URINE MICROSCOPIC-ADD ON

## 2015-08-08 MED ORDER — TRAMADOL HCL 50 MG PO TABS: 50.0000 mg | ORAL_TABLET | Freq: Four times a day (QID) | ORAL | Status: DC | PRN

## 2015-08-08 MED ORDER — NITROFURANTOIN MONOHYD MACRO 100 MG PO CAPS: 100.0000 mg | ORAL_CAPSULE | Freq: Two times a day (BID) | ORAL | Status: DC

## 2015-08-08 NOTE — Discharge Instructions (Signed)

## 2015-08-09 ENCOUNTER — Other Ambulatory Visit: Payer: Self-pay

## 2015-08-09 LAB — URINE CULTURE: SPECIAL REQUESTS: NORMAL

## 2015-08-09 MED ORDER — PREDNISONE 10 MG PO TABS: ORAL_TABLET | ORAL | Status: DC

## 2015-08-09 NOTE — Telephone Encounter (Signed)
Call to the patient and instructed. She is a "late night person" and if she can get the medication picked up, will start it today. Changed pharmacy per her request to [facility]. Patient agrees to call with her progress on Thursday 08/12/15.

## 2015-08-12 NOTE — Telephone Encounter (Signed)
Patient is advised.  

## 2015-08-12 NOTE — Telephone Encounter (Signed)
She has been on Prednisone 40 mg for 3 days now. She states she has not had any improvement at all. Still has abdominal pain and diarrhea with blood.

## 2015-08-12 NOTE — Telephone Encounter (Signed)
Continue prednisone 48m qd If no better by Monday may need to be hospitalized.  C/b Mon

## 2015-08-13 NOTE — Telephone Encounter (Signed)
APPROVED

## 2015-08-19 ENCOUNTER — Telehealth: Payer: Self-pay | Admitting: Gastroenterology

## 2015-08-19 NOTE — Telephone Encounter (Signed)
Lower prednisone to 30 mg daily in 3 days

## 2015-08-19 NOTE — Telephone Encounter (Signed)
Patient is advised.  

## 2015-08-19 NOTE — Telephone Encounter (Signed)
She is still having blood in the stools, but she states it is not as much as it was. She is not in as much pain but still has pain. Her Prednisone dose is still 40 mg every morning. She is not on Lialda or any other medication.

## 2015-08-26 ENCOUNTER — Other Ambulatory Visit: Payer: Self-pay

## 2015-08-26 ENCOUNTER — Telehealth: Payer: Self-pay | Admitting: Gastroenterology

## 2015-08-26 DIAGNOSIS — R1030 Lower abdominal pain, unspecified: Secondary | ICD-10-CM

## 2015-08-26 DIAGNOSIS — K51018 Ulcerative (chronic) pancolitis with other complication: Secondary | ICD-10-CM

## 2015-08-26 NOTE — Telephone Encounter (Signed)
She should have stools sent for C diff and CBC, as well as CRP. I see that she has been off and on Remicade. It appears she may have had a reaction with the last dose and should also have Remicade levels drawn and ABs sent prior to her next infusion. I would recommend doing these things but I have never met her and if she feels worse seeing the PA would be a good idea. If she can get labs first that would be good. Thanks

## 2015-08-26 NOTE — Telephone Encounter (Signed)
This difficult UC patient is on prednisone 30 mg for her symptoms. She is scheduled for Remicade on 08/31/15. 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  Prednisone 40 mg started 08/09/15. She was tapered to 30 mg on 08/19/15. She has only very slight improvement. She still has abdominal tenderness. Infrequent blood with bowel movements, but bowel movements are not formed. Should I bring her in with one of the PA's? Please advise.

## 2015-08-26 NOTE — Telephone Encounter (Signed)
Spoke with Amy Manzanola. She agrees to see the patient. Labs ordered. Patient aware.

## 2015-08-27 ENCOUNTER — Other Ambulatory Visit (INDEPENDENT_AMBULATORY_CARE_PROVIDER_SITE_OTHER): Payer: Medicaid Other

## 2015-08-27 DIAGNOSIS — K51018 Ulcerative (chronic) pancolitis with other complication: Secondary | ICD-10-CM | POA: Diagnosis not present

## 2015-08-27 DIAGNOSIS — R1030 Lower abdominal pain, unspecified: Secondary | ICD-10-CM | POA: Diagnosis not present

## 2015-08-27 LAB — CBC
HCT: 39.5 % (ref 36.0–46.0)
HEMOGLOBIN: 13.2 g/dL (ref 12.0–15.0)
MCHC: 33.3 g/dL (ref 30.0–36.0)
MCV: 92.8 fl (ref 78.0–100.0)
Platelets: 326 10*3/uL (ref 150.0–400.0)
RBC: 4.26 Mil/uL (ref 3.87–5.11)
RDW: 15.8 % — AB (ref 11.5–15.5)
WBC: 11.8 10*3/uL — ABNORMAL HIGH (ref 4.0–10.5)

## 2015-08-28 LAB — CLOSTRIDIUM DIFFICILE BY PCR: Toxigenic C. Difficile by PCR: NEGATIVE

## 2015-08-30 ENCOUNTER — Ambulatory Visit: Payer: Medicaid Other | Admitting: Physician Assistant

## 2015-08-30 ENCOUNTER — Telehealth: Payer: Self-pay | Admitting: Physician Assistant

## 2015-08-30 NOTE — Telephone Encounter (Signed)
The patient cancelled today for today's appointment and declined to reschedule.  We will not charge her.

## 2015-08-31 ENCOUNTER — Inpatient Hospital Stay (HOSPITAL_COMMUNITY): Admission: RE | Admit: 2015-08-31 | Payer: Medicaid Other | Source: Ambulatory Visit

## 2015-09-02 ENCOUNTER — Telehealth: Payer: Self-pay | Admitting: Gastroenterology

## 2015-09-03 ENCOUNTER — Encounter (HOSPITAL_COMMUNITY): Payer: Self-pay | Admitting: Emergency Medicine

## 2015-09-03 ENCOUNTER — Emergency Department (HOSPITAL_COMMUNITY)
Admission: EM | Admit: 2015-09-03 | Discharge: 2015-09-04 | Disposition: A | Discharge status: Home / Self Care | Payer: Medicaid Other | Attending: Emergency Medicine | Admitting: Emergency Medicine

## 2015-09-03 DIAGNOSIS — R1084 Generalized abdominal pain: Secondary | ICD-10-CM

## 2015-09-03 DIAGNOSIS — Z87891 Personal history of nicotine dependence: Secondary | ICD-10-CM | POA: Insufficient documentation

## 2015-09-03 DIAGNOSIS — Z79899 Other long term (current) drug therapy: Secondary | ICD-10-CM | POA: Diagnosis not present

## 2015-09-03 DIAGNOSIS — K625 Hemorrhage of anus and rectum: Secondary | ICD-10-CM | POA: Diagnosis not present

## 2015-09-03 DIAGNOSIS — Z862 Personal history of diseases of the blood and blood-forming organs and certain disorders involving the immune mechanism: Secondary | ICD-10-CM | POA: Diagnosis not present

## 2015-09-03 DIAGNOSIS — Z91040 Latex allergy status: Secondary | ICD-10-CM | POA: Insufficient documentation

## 2015-09-03 DIAGNOSIS — Z3202 Encounter for pregnancy test, result negative: Secondary | ICD-10-CM | POA: Diagnosis not present

## 2015-09-03 LAB — URINALYSIS, ROUTINE W REFLEX MICROSCOPIC
BILIRUBIN URINE: NEGATIVE
Glucose, UA: NEGATIVE mg/dL
Hgb urine dipstick: NEGATIVE
KETONES UR: NEGATIVE mg/dL
LEUKOCYTES UA: NEGATIVE
NITRITE: NEGATIVE
PH: 6.5 (ref 5.0–8.0)
PROTEIN: NEGATIVE mg/dL
Specific Gravity, Urine: 1.021 (ref 1.005–1.030)
UROBILINOGEN UA: 0.2 mg/dL (ref 0.0–1.0)

## 2015-09-03 LAB — COMPREHENSIVE METABOLIC PANEL
ALT: 12 U/L — ABNORMAL LOW (ref 14–54)
AST: 14 U/L — AB (ref 15–41)
Albumin: 3.8 g/dL (ref 3.5–5.0)
Alkaline Phosphatase: 63 U/L (ref 38–126)
Anion gap: 7 (ref 5–15)
BILIRUBIN TOTAL: 0.5 mg/dL (ref 0.3–1.2)
BUN: 10 mg/dL (ref 6–20)
CO2: 22 mmol/L (ref 22–32)
Calcium: 8.6 mg/dL — ABNORMAL LOW (ref 8.9–10.3)
Chloride: 107 mmol/L (ref 101–111)
Creatinine, Ser: 0.58 mg/dL (ref 0.44–1.00)
Glucose, Bld: 112 mg/dL — ABNORMAL HIGH (ref 65–99)
POTASSIUM: 3.9 mmol/L (ref 3.5–5.1)
Sodium: 136 mmol/L (ref 135–145)
Total Protein: 7.5 g/dL (ref 6.5–8.1)

## 2015-09-03 LAB — CBC WITH DIFFERENTIAL/PLATELET
BASOS ABS: 0 10*3/uL (ref 0.0–0.1)
Basophils Relative: 0 %
Eosinophils Absolute: 0.4 10*3/uL (ref 0.0–0.7)
Eosinophils Relative: 3 %
HEMATOCRIT: 40.8 % (ref 36.0–46.0)
Hemoglobin: 14 g/dL (ref 12.0–15.0)
LYMPHS ABS: 4 10*3/uL (ref 0.7–4.0)
LYMPHS PCT: 30 %
MCH: 31.4 pg (ref 26.0–34.0)
MCHC: 34.3 g/dL (ref 30.0–36.0)
MCV: 91.5 fL (ref 78.0–100.0)
MONO ABS: 1.6 10*3/uL — AB (ref 0.1–1.0)
MONOS PCT: 11 %
NEUTROS ABS: 7.6 10*3/uL (ref 1.7–7.7)
Neutrophils Relative %: 56 %
Platelets: 352 10*3/uL (ref 150–400)
RBC: 4.46 MIL/uL (ref 3.87–5.11)
RDW: 14.2 % (ref 11.5–15.5)
WBC: 13.6 10*3/uL — ABNORMAL HIGH (ref 4.0–10.5)

## 2015-09-03 LAB — POC OCCULT BLOOD, ED: FECAL OCCULT BLD: POSITIVE — AB

## 2015-09-03 LAB — INFLIXIMAB+AB (SERIAL MONITOR)
Anti-Infliximab Antibody: 1622 ng/mL — ABNORMAL HIGH
Infliximab Drug Level: <0.4 ug/mL

## 2015-09-03 LAB — PREGNANCY, URINE: PREG TEST UR: NEGATIVE

## 2015-09-03 MED ORDER — SODIUM CHLORIDE 0.9 % IV BOLUS (SEPSIS)
1000.0000 mL | Freq: Once | INTRAVENOUS | Status: AC
  Administered 2015-09-03: 1000 mL via INTRAVENOUS

## 2015-09-03 MED ORDER — MORPHINE SULFATE (PF) 4 MG/ML IV SOLN
4.0000 mg | Freq: Once | INTRAVENOUS | Status: AC
  Administered 2015-09-03: 4 mg via INTRAVENOUS
  Filled 2015-09-03: qty 1

## 2015-09-03 NOTE — ED Notes (Signed)
Pt c/o abd pain for several days with dark red blood in stool x 8 in last 24 hours. Pt has known ulcer, now having increased pain. Denies vomiting

## 2015-09-03 NOTE — ED Provider Notes (Signed)
CSN: [id number]     Arrival date & time 09/03/15  2116 History   First MD Initiated Contact with Patient 09/03/15 2126     Chief Complaint  Patient presents with  . Abdominal Pain     (Consider location/radiation/quality/duration/timing/severity/associated sxs/prior Treatment) HPI   29 year old female with history of ulcerative colitis, anemia, recurrent headaches who presents for evaluation of rectal bleeding and abdominal pain. Patient states she has been doing with her also colitis flare for the past 3 months. She endorses having generalized abdominal cramping and having recurrent rectal bleeding with bowel movement. For the past week, patient has had increased abdominal discomfort and increase rectal bleeding. States she has upward to 8-10 bouts of bowel movements daily. She feels fatigue, feeling nauseous and relates symptoms to her ulcerative colitis. Her symptoms worsen since she has been off her previous UC medication Lialda because her doctor thought she may be allergic to it.  She denies any specific treatment tried. No fever, headache, chest pain, shortness of breath, hemoptysis, hematemesis, or melena. Her GI specialist is Dr. Deatra Ina. She does have history of prior blood transfusion.  Past Medical History  Diagnosis Date  . Anemia   . Chronic ulcerative colitis ([place])   . Headache(784.0)   . Blood transfusion without reported diagnosis     "year ago" b/c of anemia & UC  . Allergy     SEASONAL   Past Surgical History  Procedure Laterality Date  . Colon biopsy    . Tonsillectomy    . Dilation and evacuation  08/21/2012    Procedure: DILATATION AND EVACUATION;  Surgeon: Logan Bores, MD;  Location: [facility];  Service: Gynecology;  Laterality: N/A;  . Cesarean section N/A 12/09/2013    Procedure: Primary Cesarean Section Delivery Baby Girl @ 1960, Apgars 8/8;  Surgeon: Logan Bores, MD;  Location: [facility];  Service: Obstetrics;  Laterality: N/A;  . Colonoscopy   01/19/2011   Family History  Problem Relation Age of Onset  . Anesthesia problems Neg Hx   . Hypotension Neg Hx   . Malignant hyperthermia Neg Hx   . Pseudochol deficiency Neg Hx   . Other Neg Hx   . Alcohol abuse Neg Hx   . Arthritis Neg Hx   . Asthma Neg Hx   . Birth defects Neg Hx   . Cancer Neg Hx   . COPD Neg Hx   . Depression Neg Hx   . Diabetes Neg Hx   . Drug abuse Neg Hx   . Early death Neg Hx   . Hearing loss Neg Hx   . Heart disease Neg Hx   . Hyperlipidemia Neg Hx   . Hypertension Neg Hx   . Kidney disease Neg Hx   . Learning disabilities Neg Hx   . Mental illness Neg Hx   . Mental retardation Neg Hx   . Miscarriages / Stillbirths Neg Hx   . Stroke Neg Hx   . Vision loss Neg Hx    Social History  Substance Use Topics  . Smoking status: Former Smoker -- 3 years    Types: Cigarettes    Quit date: 05/16/2010  . Smokeless tobacco: Never Used  . Alcohol Use: No   OB History    Gravida Para Term Preterm AB TAB SAB Ectopic Multiple Living   3 2 2  1  1   2      Review of Systems  All other systems reviewed and are negative.  Allergies  Latex and Remicade  Home Medications   Prior to Admission medications   Medication Sig Start Date End Date Taking? Authorizing Provider  dicyclomine (BENTYL) 10 MG capsule Take 1 capsule (10 mg total) by mouth every 6 (six) hours as needed for spasms. 08/02/15   Inda Castle, MD  mesalamine (LIALDA) 1.2 G EC tablet TAKE FOUR TABLETS BY MOUTH DAILY WITH BREAKFAST 07/19/15   Inda Castle, MD  nitrofurantoin, macrocrystal-monohydrate, (MACROBID) 100 MG capsule Take 1 capsule (100 mg total) by mouth 2 (two) times daily. X 7 days 08/08/15   Jarrett Soho Muthersbaugh, PA-C  predniSONE (DELTASONE) 10 MG tablet Take four (4) tablets every morning and as directed 08/09/15   Inda Castle, MD  traMADol (ULTRAM) 50 MG tablet Take 1 tablet (50 mg total) by mouth every 6 (six) hours as needed. 08/08/15   Noemi Chapel, MD  traZODone  (DESYREL) 50 MG tablet Take 1 tablet (50 mg total) by mouth at bedtime. Patient taking differently: Take 50 mg by mouth at bedtime as needed for sleep.  02/03/15   Billy Fischer, MD   BP 117/81 mmHg  Pulse 103  Temp(Src) 98 F (36.7 C) (Oral)  Resp 16  Ht 4' 11"  (1.499 m)  Wt 130 lb (58.968 kg)  BMI 26.24 kg/m2  SpO2 100%  LMP  (LMP Unknown) Physical Exam  Constitutional: She appears well-developed and well-nourished. No distress.  HENT:  Head: Atraumatic.  Eyes: Conjunctivae are normal.  Neck: Neck supple.  Cardiovascular: Normal rate and regular rhythm.   Pulmonary/Chest: Effort normal and breath sounds normal.  Abdominal: Soft. There is tenderness (Mild diffuse abdominal tenderness without guarding or rebound tenderness.).  Genitourinary:  Chaperone present during exam. Normal rectal tone, no obvious mass appreciated. Normal color stool, Hemoccult-positive.  Neurological: She is alert.  Skin: No rash noted.  Psychiatric: She has a normal mood and affect.  Nursing note and vitals reviewed.   ED Course  Procedures (including critical care time)  Patient with history of ulcerative colitis, here with recurrent rectal bleeding and abdominal cramping. She is well-appearing with a nonsurgical abdomen. Low suspicion for bowel perforation at this time. Her vital signs stable. Workup initiated.  12:03 AM   Labs Review Labs Reviewed  CBC WITH DIFFERENTIAL/PLATELET - Abnormal; Notable for the following:    WBC 13.6 (*)    Monocytes Absolute 1.6 (*)    All other components within normal limits  COMPREHENSIVE METABOLIC PANEL - Abnormal; Notable for the following:    Glucose, Bld 112 (*)    Calcium 8.6 (*)    AST 14 (*)    ALT 12 (*)    All other components within normal limits  POC OCCULT BLOOD, ED - Abnormal; Notable for the following:    Fecal Occult Bld POSITIVE (*)    All other components within normal limits  URINALYSIS, ROUTINE W REFLEX MICROSCOPIC (NOT AT [facility])   PREGNANCY, URINE  POC URINE PREG, ED    Imaging Review No results found. I have personally reviewed and evaluated these images and lab results as part of my medical decision-making.   EKG Interpretation None      MDM   Final diagnoses:  Generalized abdominal pain  Rectal bleeding    BP 114/58 mmHg  Pulse 89  Temp(Src) 98.1 F (36.7 C) (Oral)  Resp 16  Ht 4' 11"  (1.499 m)  Wt 130 lb (58.968 kg)  BMI 26.24 kg/m2  SpO2 100%  LMP  (LMP Unknown)  Domenic Moras, PA-C 09/04/15 0008  Lacretia Leigh, MD 09/05/15 [phone]

## 2015-09-03 NOTE — Telephone Encounter (Signed)
Made an appointment for patient to establish care with Dr Silverio Decamp per patients request for [facility] will not cover Lialda, I told her I will leave her samples and she can discuss further options on her appointment date

## 2015-09-03 NOTE — ED Notes (Signed)
Nurse drawing labs. 

## 2015-09-04 MED ORDER — TRAMADOL HCL 50 MG PO TABS: 50.0000 mg | ORAL_TABLET | Freq: Four times a day (QID) | ORAL | Status: DC | PRN

## 2015-09-04 NOTE — Discharge Instructions (Signed)
Your rectal bleeding is likely due to your history of ulcerative colitis. You will need to follow-up closely with your GI specialist for further management of your condition. Take pain medication as needed. Return to the ED if your condition worsen or if you have any other concern.  Gastrointestinal Bleeding Gastrointestinal bleeding is bleeding somewhere along the path that food travels through the body (digestive tract). This path is anywhere between the mouth and the opening of the butt (anus). You may have blood in your throw up (vomit) or in your poop (stools). If there is a lot of bleeding, you may need to stay in the hospital. [place]  Only take medicine as told by your doctor.  Eat foods with fiber such as whole grains, fruits, and vegetables. You can also try eating 1 to 3 prunes a day.  Drink enough fluids to keep your pee (urine) clear or pale yellow. GET HELP RIGHT AWAY IF:   Your bleeding gets worse.  You feel dizzy, weak, or you pass out (faint).  You have bad cramps in your back or belly (abdomen).  You have large blood clumps (clots) in your poop.  Your problems are getting worse. MAKE SURE YOU:   Understand these instructions.  Will watch your condition.  Will get help right away if you are not doing well or get worse.   This information is not intended to replace advice given to you by your health care provider. Make sure you discuss any questions you have with your health care provider.   Document Released: 08/15/2008 Document Revised: 10/23/2012 Document Reviewed: 04/26/2015 Elsevier Interactive Patient Education [employer].

## 2015-09-07 ENCOUNTER — Encounter (HOSPITAL_COMMUNITY)
Admission: RE | Admit: 2015-09-07 | Discharge: 2015-09-07 | Disposition: A | Discharge status: Home / Self Care | Payer: Medicaid Other | Source: Ambulatory Visit | Attending: Gastroenterology | Admitting: Gastroenterology

## 2015-09-07 ENCOUNTER — Encounter (HOSPITAL_COMMUNITY): Payer: Self-pay

## 2015-09-07 VITALS — BP 108/58 | HR 80 | Temp 98.6°F | Resp 20 | Ht 59.0 in | Wt 130.0 lb

## 2015-09-07 DIAGNOSIS — K5190 Ulcerative colitis, unspecified, without complications: Secondary | ICD-10-CM | POA: Diagnosis present

## 2015-09-07 HISTORY — DX: Patient's other noncompliance with medication regimen: Z91.14

## 2015-09-07 MED ORDER — DIPHENHYDRAMINE HCL 25 MG PO TABS
50.0000 mg | ORAL_TABLET | Freq: Once | ORAL | Status: DC
  Filled 2015-09-07: qty 2

## 2015-09-07 MED ORDER — SODIUM CHLORIDE 0.9 % IV SOLN
5.0000 mg/kg | INTRAVENOUS | Status: DC
  Administered 2015-09-07: 300 mg via INTRAVENOUS
  Filled 2015-09-07: qty 30

## 2015-09-07 MED ORDER — METHYLPREDNISOLONE SODIUM SUCC 40 MG IJ SOLR
INTRAMUSCULAR | Status: AC
  Administered 2015-09-07: 40 mg via INTRAVENOUS
  Filled 2015-09-07: qty 1

## 2015-09-07 MED ORDER — METHYLPREDNISOLONE SODIUM SUCC 40 MG IJ SOLR
40.0000 mg | Freq: Once | INTRAMUSCULAR | Status: AC
  Administered 2015-09-07: 40 mg via INTRAVENOUS

## 2015-09-07 MED ORDER — DIPHENHYDRAMINE HCL 50 MG/ML IJ SOLN
50.0000 mg | Freq: Once | INTRAMUSCULAR | Status: AC
  Administered 2015-09-07: 50 mg via INTRAVENOUS

## 2015-09-07 MED ORDER — ACETAMINOPHEN 325 MG PO TABS
ORAL_TABLET | ORAL | Status: AC
  Administered 2015-09-07: 650 mg via ORAL
  Filled 2015-09-07: qty 2

## 2015-09-07 MED ORDER — SODIUM CHLORIDE 0.9 % IV SOLN
INTRAVENOUS | Status: DC
Start: 2015-09-07 — End: 2015-09-08
  Administered 2015-09-07: 250 mL via INTRAVENOUS

## 2015-09-07 MED ORDER — ACETAMINOPHEN 325 MG PO TABS
650.0000 mg | ORAL_TABLET | Freq: Once | ORAL | Status: AC
  Administered 2015-09-07: 650 mg via ORAL

## 2015-09-07 MED ORDER — DIPHENHYDRAMINE HCL 50 MG/ML IJ SOLN
INTRAMUSCULAR | Status: AC
  Administered 2015-09-07: 50 mg via INTRAVENOUS
  Filled 2015-09-07: qty 1

## 2015-09-07 NOTE — Progress Notes (Signed)
Called by Patriciaann Clan, RN at outpt day hospital.  Pt has UC. Restarted Remicade infusions in 06/2015 after several months of stopping this on her own and being out of touch with GI MD Deatra Ina)  Today, pt experiencing pruritus during Remicade infusion.  Per protocol orders was given 50 mg IV Benadryl and 650 mg po Tylenol but was still itching.  Rate of the drug infusion was reduced from 150 ml/hour to 80 ml /hour.  10 minutes later the itching was better.  No rash, no trouble breathing, no hypo/hypertension or tachy/bradycardia.  Reviewed notes, pt had previous issues with pruritus during 07/22/15 Remicade infusion, and LE pain in hands and legs after 8/18 infusion, her first infusion after self discontinuing Remicade several months previously.   Seen in ED on 10/14 with abdominal pain.  Remicade Ab and drug level testing done on 10/7 showed elevated Ab levels and low Remicade levels.    Spoke with Dr Silverio Decamp.   Will stop Remicade.  Give Solumedrol 40 mg now, continue 25 mg po Benadryl at home for next 24 hours.  Observe at day hospital for another 90 minutes and send her home so long as she is doing well.  She will resume her Prednsone at 30 mg daily, continue Lialda.  Has 09/20/15 appt with Alonza Bogus PA and Gi office.  Will possibly be switched to Humira at that time.    Azucena Freed PA-C

## 2015-09-07 NOTE — Discharge Instructions (Signed)
Take benadryl 25 mgm every 6 hours beginning at 6:30 PM tonight and ending at 6:30 tomorrow night 09/08/2015.   Call [facility] [phone]) if problems arise or go to nearest emergency room if needed Call [facility] [phone]) to check time and date of appointment made by Azucena Freed, PA

## 2015-09-07 NOTE — Progress Notes (Addendum)
C/O itching all over when the Remicade infusion rate was increased from 80 to 150 cc/hr. Patient denied shortness of breath or chest pain, and no rash was noted.  Gerre Couch, PA advised.  Will decrease infusion rate back to 80 and give  Solumedrol IV and continue to monitor patient. 1300 infusion stopped per order.  Will observe patient for 90 minutes and discharge if no problems noted per order.  Instructed patient to have benadryl 25 mgm on hand at home and to use every 6 hours for next 24 hours.  Judson Roch will schedule an appointment with a new gastroenterologist since Dr Deatra Ina is no longer in the practice. Patient instructed to call [facility] to get the appointment time that Sarah scheduled. 1400 denies itching

## 2015-09-20 ENCOUNTER — Telehealth: Payer: Self-pay | Admitting: Gastroenterology

## 2015-09-20 ENCOUNTER — Ambulatory Visit: Payer: Medicaid Other | Admitting: Gastroenterology

## 2015-09-20 NOTE — Telephone Encounter (Signed)
Alonza Bogus PA-C says yes to charging the cancellation fee.

## 2015-10-05 ENCOUNTER — Other Ambulatory Visit: Payer: Self-pay | Admitting: Gastroenterology

## 2015-10-12 ENCOUNTER — Emergency Department (HOSPITAL_COMMUNITY): Payer: Medicaid Other

## 2015-10-12 ENCOUNTER — Emergency Department (HOSPITAL_COMMUNITY)
Admission: EM | Admit: 2015-10-12 | Discharge: 2015-10-12 | Disposition: A | Discharge status: Home / Self Care | Payer: Medicaid Other | Attending: Physician Assistant | Admitting: Physician Assistant

## 2015-10-12 ENCOUNTER — Encounter (HOSPITAL_COMMUNITY): Payer: Self-pay | Admitting: Emergency Medicine

## 2015-10-12 DIAGNOSIS — Z9119 Patient's noncompliance with other medical treatment and regimen: Secondary | ICD-10-CM | POA: Diagnosis not present

## 2015-10-12 DIAGNOSIS — Z8719 Personal history of other diseases of the digestive system: Secondary | ICD-10-CM | POA: Insufficient documentation

## 2015-10-12 DIAGNOSIS — Z3A01 Less than 8 weeks gestation of pregnancy: Secondary | ICD-10-CM | POA: Insufficient documentation

## 2015-10-12 DIAGNOSIS — O9989 Other specified diseases and conditions complicating pregnancy, childbirth and the puerperium: Secondary | ICD-10-CM | POA: Insufficient documentation

## 2015-10-12 DIAGNOSIS — R05 Cough: Secondary | ICD-10-CM | POA: Insufficient documentation

## 2015-10-12 DIAGNOSIS — R109 Unspecified abdominal pain: Secondary | ICD-10-CM | POA: Diagnosis not present

## 2015-10-12 DIAGNOSIS — Z79899 Other long term (current) drug therapy: Secondary | ICD-10-CM | POA: Insufficient documentation

## 2015-10-12 DIAGNOSIS — Z862 Personal history of diseases of the blood and blood-forming organs and certain disorders involving the immune mechanism: Secondary | ICD-10-CM | POA: Insufficient documentation

## 2015-10-12 DIAGNOSIS — Z91040 Latex allergy status: Secondary | ICD-10-CM | POA: Insufficient documentation

## 2015-10-12 DIAGNOSIS — Z87891 Personal history of nicotine dependence: Secondary | ICD-10-CM | POA: Insufficient documentation

## 2015-10-12 DIAGNOSIS — Z3490 Encounter for supervision of normal pregnancy, unspecified, unspecified trimester: Secondary | ICD-10-CM

## 2015-10-12 LAB — COMPREHENSIVE METABOLIC PANEL
ALT: 13 U/L — ABNORMAL LOW (ref 14–54)
ANION GAP: 6 (ref 5–15)
AST: 14 U/L — AB (ref 15–41)
Albumin: 3.7 g/dL (ref 3.5–5.0)
Alkaline Phosphatase: 74 U/L (ref 38–126)
BILIRUBIN TOTAL: 0.2 mg/dL — AB (ref 0.3–1.2)
BUN: <5 mg/dL — ABNORMAL LOW (ref 6–20)
CHLORIDE: 107 mmol/L (ref 101–111)
CO2: 24 mmol/L (ref 22–32)
Calcium: 8.7 mg/dL — ABNORMAL LOW (ref 8.9–10.3)
Creatinine, Ser: 0.54 mg/dL (ref 0.44–1.00)
Glucose, Bld: 93 mg/dL (ref 65–99)
POTASSIUM: 3.7 mmol/L (ref 3.5–5.1)
Sodium: 137 mmol/L (ref 135–145)
TOTAL PROTEIN: 7 g/dL (ref 6.5–8.1)

## 2015-10-12 LAB — CBC WITH DIFFERENTIAL/PLATELET
BASOS ABS: 0 10*3/uL (ref 0.0–0.1)
Basophils Relative: 0 %
EOS ABS: 0.1 10*3/uL (ref 0.0–0.7)
EOS PCT: 1 %
HCT: 39.1 % (ref 36.0–46.0)
Hemoglobin: 13 g/dL (ref 12.0–15.0)
Lymphocytes Relative: 23 %
Lymphs Abs: 2.7 10*3/uL (ref 0.7–4.0)
MCH: 30.3 pg (ref 26.0–34.0)
MCHC: 33.2 g/dL (ref 30.0–36.0)
MCV: 91.1 fL (ref 78.0–100.0)
MONO ABS: 1.3 10*3/uL — AB (ref 0.1–1.0)
Monocytes Relative: 11 %
Neutro Abs: 7.8 10*3/uL — ABNORMAL HIGH (ref 1.7–7.7)
Neutrophils Relative %: 65 %
PLATELETS: 329 10*3/uL (ref 150–400)
RBC: 4.29 MIL/uL (ref 3.87–5.11)
RDW: 14 % (ref 11.5–15.5)
WBC: 11.9 10*3/uL — AB (ref 4.0–10.5)

## 2015-10-12 LAB — HCG, SERUM, QUALITATIVE: PREG SERUM: POSITIVE — AB

## 2015-10-12 LAB — RAPID STREP SCREEN (MED CTR MEBANE ONLY): STREPTOCOCCUS, GROUP A SCREEN (DIRECT): NEGATIVE

## 2015-10-12 LAB — ABO/RH: ABO/RH(D): B POS

## 2015-10-12 LAB — HCG, QUANTITATIVE, PREGNANCY: HCG, BETA CHAIN, QUANT, S: 8802 m[IU]/mL — AB (ref <5)

## 2015-10-12 NOTE — ED Notes (Signed)
Patient transported to Ultrasound 

## 2015-10-12 NOTE — Discharge Instructions (Signed)
[facility] phone number is  [phone]. Address is: [address], [place], [place] [address]   First Trimester of Pregnancy The first trimester of pregnancy is from week 1 until the end of week 12 (months 1 through 3). A week after a sperm fertilizes an egg, the egg will implant on the wall of the uterus. This embryo will begin to develop into a baby. Genes from you and your partner are forming the baby. The female genes determine whether the baby is a boy or a girl. At 6-8 weeks, the eyes and face are formed, and the heartbeat can be seen on ultrasound. At the end of 12 weeks, all the baby's organs are formed.  Now that you are pregnant, you will want to do everything you can to have a healthy baby. Two of the most important things are to get good prenatal care and to follow your health care provider's instructions. Prenatal care is all the medical care you receive before the baby's birth. This care will help prevent, find, and treat any problems during the pregnancy and childbirth. BODY CHANGES Your body goes through many changes during pregnancy. The changes vary from woman to woman.   You may gain or lose a couple of pounds at first.  You may feel sick to your stomach (nauseous) and throw up (vomit). If the vomiting is uncontrollable, call your health care provider.  You may tire easily.  You may develop headaches that can be relieved by medicines approved by your health care provider.  You may urinate more often. Painful urination may mean you have a bladder infection.  You may develop heartburn as a result of your pregnancy.  You may develop constipation because certain hormones are causing the muscles that push waste through your intestines to slow down.  You may develop hemorrhoids or swollen, bulging veins (varicose veins).  Your breasts may begin to grow larger and become tender. Your nipples may stick out more, and the tissue that surrounds them (areola) may become  darker.  Your gums may bleed and may be sensitive to brushing and flossing.  Dark spots or blotches (chloasma, mask of pregnancy) may develop on your face. This will likely fade after the baby is born.  Your menstrual periods will stop.  You may have a loss of appetite.  You may develop cravings for certain kinds of food.  You may have changes in your emotions from day to day, such as being excited to be pregnant or being concerned that something may go wrong with the pregnancy and baby.  You may have more vivid and strange dreams.  You may have changes in your hair. These can include thickening of your hair, rapid growth, and changes in texture. Some women also have hair loss during or after pregnancy, or hair that feels dry or thin. Your hair will most likely return to normal after your baby is born. WHAT TO EXPECT AT YOUR PRENATAL VISITS During a routine prenatal visit:  You will be weighed to make sure you and the baby are growing normally.  Your blood pressure will be taken.  Your abdomen will be measured to track your baby's growth.  The fetal heartbeat will be listened to starting around week 10 or 12 of your pregnancy.  Test results from any previous visits will be discussed. Your health care provider may ask you:  How you are feeling.  If you are feeling the baby move.  If you have had any abnormal symptoms, such  as leaking fluid, bleeding, severe headaches, or abdominal cramping.  If you are using any tobacco products, including cigarettes, chewing tobacco, and electronic cigarettes.  If you have any questions. Other tests that may be performed during your first trimester include:  Blood tests to find your blood type and to check for the presence of any previous infections. They will also be used to check for low iron levels (anemia) and Rh antibodies. Later in the pregnancy, blood tests for diabetes will be done along with other tests if problems develop.  Urine  tests to check for infections, diabetes, or protein in the urine.  An ultrasound to confirm the proper growth and development of the baby.  An amniocentesis to check for possible genetic problems.  Fetal screens for spina bifida and Down syndrome.  You may need other tests to make sure you and the baby are doing well.  HIV (human immunodeficiency virus) testing. Routine prenatal testing includes screening for HIV, unless you choose not to have this test. HOME CARE INSTRUCTIONS  Medicines  Follow your health care provider's instructions regarding medicine use. Specific medicines may be either safe or unsafe to take during pregnancy.  Take your prenatal vitamins as directed.  If you develop constipation, try taking a stool softener if your health care provider approves. Diet  Eat regular, well-balanced meals. Choose a variety of foods, such as meat or vegetable-based protein, fish, milk and low-fat dairy products, vegetables, fruits, and whole grain breads and cereals. Your health care provider will help you determine the amount of weight gain that is right for you.  Avoid raw meat and uncooked cheese. These carry germs that can cause birth defects in the baby.  Eating four or five small meals rather than three large meals a day may help relieve nausea and vomiting. If you start to feel nauseous, eating a few soda crackers can be helpful. Drinking liquids between meals instead of during meals also seems to help nausea and vomiting.  If you develop constipation, eat more high-fiber foods, such as fresh vegetables or fruit and whole grains. Drink enough fluids to keep your urine clear or pale yellow. Activity and Exercise  Exercise only as directed by your health care provider. Exercising will help you:  Control your weight.  Stay in shape.  Be prepared for labor and delivery.  Experiencing pain or cramping in the lower abdomen or low back is a good sign that you should stop  exercising. Check with your health care provider before continuing normal exercises.  Try to avoid standing for long periods of time. Move your legs often if you must stand in one place for a long time.  Avoid heavy lifting.  Wear low-heeled shoes, and practice good posture.  You may continue to have sex unless your health care provider directs you otherwise. Relief of Pain or Discomfort  Wear a good support bra for breast tenderness.   Take warm sitz baths to soothe any pain or discomfort caused by hemorrhoids. Use hemorrhoid cream if your health care provider approves.   Rest with your legs elevated if you have leg cramps or low back pain.  If you develop varicose veins in your legs, wear support hose. Elevate your feet for 15 minutes, 3-4 times a day. Limit salt in your diet. Prenatal Care  Schedule your prenatal visits by the twelfth week of pregnancy. They are usually scheduled monthly at first, then more often in the last 2 months before delivery.  Write down your  questions. Take them to your prenatal visits.  Keep all your prenatal visits as directed by your health care provider. Safety  Wear your seat belt at all times when driving.  Make a list of emergency phone numbers, including numbers for family, friends, the hospital, and police and fire departments. General Tips  Ask your health care provider for a referral to a local prenatal education class. Begin classes no later than at the beginning of month 6 of your pregnancy.  Ask for help if you have counseling or nutritional needs during pregnancy. Your health care provider can offer advice or refer you to specialists for help with various needs.  Do not use hot tubs, steam rooms, or saunas.  Do not douche or use tampons or scented sanitary pads.  Do not cross your legs for long periods of time.  Avoid cat litter boxes and soil used by cats. These carry germs that can cause birth defects in the baby and possibly  loss of the fetus by miscarriage or stillbirth.  Avoid all smoking, herbs, alcohol, and medicines not prescribed by your health care provider. Chemicals in these affect the formation and growth of the baby.  Do not use any tobacco products, including cigarettes, chewing tobacco, and electronic cigarettes. If you need help quitting, ask your health care provider. You may receive counseling support and other resources to help you quit.  Schedule a dentist appointment. At home, brush your teeth with a soft toothbrush and be gentle when you floss. SEEK MEDICAL CARE IF:   You have dizziness.  You have mild pelvic cramps, pelvic pressure, or nagging pain in the abdominal area.  You have persistent nausea, vomiting, or diarrhea.  You have a bad smelling vaginal discharge.  You have pain with urination.  You notice increased swelling in your face, hands, legs, or ankles. SEEK IMMEDIATE MEDICAL CARE IF:   You have a fever.  You are leaking fluid from your vagina.  You have spotting or bleeding from your vagina.  You have severe abdominal cramping or pain.  You have rapid weight gain or loss.  You vomit blood or material that looks like coffee grounds.  You are exposed to [place] measles and have never had them.  You are exposed to fifth disease or chickenpox.  You develop a severe headache.  You have shortness of breath.  You have any kind of trauma, such as from a fall or a car accident.   This information is not intended to replace advice given to you by your health care provider. Make sure you discuss any questions you have with your health care provider.   Document Released: 10/31/2001 Document Revised: 11/27/2014 Document Reviewed: 09/16/2013 Elsevier Interactive Patient Education [employer].

## 2015-10-12 NOTE — ED Provider Notes (Signed)
CSN: [id number]     Arrival date & time 10/12/15  0913 History   First MD Initiated Contact with Patient 10/12/15 0940     Chief Complaint  Patient presents with  . cramping/sore throat      (Consider location/radiation/quality/duration/timing/severity/associated sxs/prior Treatment) HPI   Patient is a 29 year old female presenting with abdominal cramping in the setting of a new positive pregnancy test. Patient already has 2 children at home. Has had one miscarriage. Patient reports cramping so she took an over-the-counter pregnancy test. She's had no vaginal discharge or bleeding.  Patient also reports upper URI symptoms which are not concerning, no fever, mild cough, mild stuffiness.  Past Medical History  Diagnosis Date  . Anemia 2008    Iron deficiency.  s/p transfusions 2009, 2010. Inconsistent compliance with po Iron.   . Chronic ulcerative colitis ([place]) 2007  . Headache(784.0)   . Allergy     SEASONAL  . Non compliance w medication regimen 06/2015    stopped Remicade, may not have been taking Azulfidine.   Past Surgical History  Procedure Laterality Date  . Tonsillectomy    . Dilation and evacuation  08/21/2012    Procedure: DILATATION AND EVACUATION;  Surgeon: Logan Bores, MD;  Location: [facility];  Service: Gynecology;  Laterality: N/A;  . Cesarean section N/A 12/09/2013    Procedure: Primary Cesarean Section Delivery Baby Girl @ 1959, Apgars 8/8;  Surgeon: Logan Bores, MD;  Location: [facility];  Service: Obstetrics;  Laterality: N/A;  . Colonoscopy  01/19/2011    Dr Penelope Coop. no active UC  . Colonoscopy w/ biopsies  04/2014    Dr Deatra Ina. Left sided colitis from prox descending to rectum. iNo inflammation at and proximal to splenic flexure.    Family History  Problem Relation Age of Onset  . Hypotension Neg Hx   . Alcohol abuse Neg Hx   . Arthritis Neg Hx   . Asthma Neg Hx   . Birth defects Neg Hx   . COPD Neg Hx   . Depression Neg Hx   . Diabetes Neg Hx    . Drug abuse Neg Hx   . Hearing loss Neg Hx   . Heart disease Neg Hx   . Hyperlipidemia Neg Hx   . Hypertension Neg Hx   . Kidney disease Neg Hx   . Learning disabilities Neg Hx   . Mental illness Neg Hx   . Mental retardation Neg Hx   . Miscarriages / [place]     Social History  Substance Use Topics  . Smoking status: Former Smoker -- 3 years    Types: Cigarettes    Quit date: 05/16/2010  . Smokeless tobacco: Never Used  . Alcohol Use: No   OB History    Gravida Para Term Preterm AB TAB SAB Ectopic Multiple Living   3 2 2  1  1   2      Review of Systems  Constitutional: Negative for activity change and fatigue.  HENT: Positive for congestion.   Eyes: Negative for discharge.  Respiratory: Negative for cough and chest tightness.   Cardiovascular: Negative for chest pain.  Gastrointestinal: Positive for abdominal pain.  Genitourinary: Negative for dysuria, urgency, hematuria, vaginal bleeding, vaginal discharge, difficulty urinating and vaginal pain.  Musculoskeletal: Negative for joint swelling.  Skin: Negative for rash.  Allergic/Immunologic: Negative for immunocompromised state.  Psychiatric/Behavioral: Negative for behavioral problems.      Allergies  Latex and Remicade  Home Medications   Prior to  Admission medications   Medication Sig Start Date End Date Taking? Authorizing Provider  mesalamine (LIALDA) 1.2 G EC tablet TAKE FOUR TABLETS BY MOUTH DAILY WITH BREAKFAST 07/19/15  Yes Inda Castle, MD  dicyclomine (BENTYL) 10 MG capsule Take 1 capsule (10 mg total) by mouth every 6 (six) hours as needed for spasms. Patient not taking: Reported on 09/03/2015 08/02/15   Inda Castle, MD  nitrofurantoin, macrocrystal-monohydrate, (MACROBID) 100 MG capsule Take 1 capsule (100 mg total) by mouth 2 (two) times daily. X 7 days Patient not taking: Reported on 09/03/2015 08/08/15   Jarrett Soho Muthersbaugh, PA-C  predniSONE (DELTASONE) 10 MG tablet Take four (4) tablets  every morning and as directed Patient not taking: Reported on 09/03/2015 08/09/15   Inda Castle, MD  traMADol (ULTRAM) 50 MG tablet Take 1 tablet (50 mg total) by mouth every 6 (six) hours as needed for moderate pain. Patient not taking: Reported on 10/12/2015 09/04/15   Domenic Moras, PA-C  traZODone (DESYREL) 50 MG tablet Take 1 tablet (50 mg total) by mouth at bedtime. Patient not taking: Reported on 09/03/2015 02/03/15   Billy Fischer, MD   BP 112/87 mmHg  Pulse 90  Temp(Src) 98.5 F (36.9 C) (Oral)  Resp 16  SpO2 100%  LMP 09/20/2015 Physical Exam  Constitutional: She is oriented to person, place, and time. She appears well-developed and well-nourished.  HENT:  Head: Normocephalic and atraumatic.  Eyes: Conjunctivae are normal. Right eye exhibits no discharge.  Neck: Neck supple.  Cardiovascular: Normal rate, regular rhythm and normal heart sounds.   No murmur heard. Pulmonary/Chest: Effort normal and breath sounds normal. She has no wheezes. She has no rales.  Abdominal: Soft. She exhibits no distension. There is no tenderness.  Genitourinary: Vagina normal.  Closed os  Musculoskeletal: Normal range of motion. She exhibits no edema.  Neurological: She is oriented to person, place, and time. No cranial nerve deficit.  Skin: Skin is warm and dry. No rash noted. She is not diaphoretic.  Psychiatric: She has a normal mood and affect. Her behavior is normal.  Nursing note and vitals reviewed.   ED Course  Procedures (including critical care time) Labs Review Labs Reviewed  CBC WITH DIFFERENTIAL/PLATELET - Abnormal; Notable for the following:    WBC 11.9 (*)    Neutro Abs 7.8 (*)    Monocytes Absolute 1.3 (*)    All other components within normal limits  COMPREHENSIVE METABOLIC PANEL - Abnormal; Notable for the following:    BUN <5 (*)    Calcium 8.7 (*)    AST 14 (*)    ALT 13 (*)    Total Bilirubin 0.2 (*)    All other components within normal limits  HCG, SERUM,  QUALITATIVE - Abnormal; Notable for the following:    Preg, Serum POSITIVE (*)    All other components within normal limits  HCG, QUANTITATIVE, PREGNANCY - Abnormal; Notable for the following:    hCG, Beta Chain, Quant, S 8802 (*)    All other components within normal limits  RAPID STREP SCREEN (NOT AT [facility])  CULTURE, GROUP A STREP  ABO/RH    Imaging Review US Ob Comp Less 14 Wks  10/12/2015  CLINICAL DATA:  Pelvic pain. EXAM: OBSTETRIC <14 WK [place] AND TRANSVAGINAL OB US TECHNIQUE: Both transabdominal and transvaginal ultrasound examinations were performed for complete evaluation of the gestation as well as the maternal uterus, adnexal regions, and pelvic cul-de-sac. Transvaginal technique was performed to assess early pregnancy. COMPARISON:  None. FINDINGS: Intrauterine gestational sac: Visualized/normal in shape. Yolk sac:  Present. Embryo:  Not visualized. Cardiac Activity: Not visualized. MSD: 1.0 cm  mm   5 w   5  d IMPRESSION: Intrauterine gestational sac with yolk sac. Probable early intrauterine gestational sac, but no fetal pole, or cardiac activity yet visualized. Recommend follow-up quantitative B-HCG levels and follow-up US in 14 days to confirm and assess viability. This recommendation follows SRU consensus guidelines: Diagnostic Criteria for Nonviable Pregnancy Early in the First Trimester. [employer] [employer] 2013; 553:7482-70. Electronically Signed   By: Marcello Moores  Register   On: 10/12/2015 12:31   US Ob Transvaginal  10/12/2015  CLINICAL DATA:  Pelvic pain. EXAM: OBSTETRIC <14 WK [place] AND TRANSVAGINAL OB US TECHNIQUE: Both transabdominal and transvaginal ultrasound examinations were performed for complete evaluation of the gestation as well as the maternal uterus, adnexal regions, and pelvic cul-de-sac. Transvaginal technique was performed to assess early pregnancy. COMPARISON:  None. FINDINGS: Intrauterine gestational sac: Visualized/normal in shape. Yolk sac:  Present. Embryo:  Not visualized.  Cardiac Activity: Not visualized. MSD: 1.0 cm  mm   5 w   5  d IMPRESSION: Intrauterine gestational sac with yolk sac. Probable early intrauterine gestational sac, but no fetal pole, or cardiac activity yet visualized. Recommend follow-up quantitative B-HCG levels and follow-up US in 14 days to confirm and assess viability. This recommendation follows SRU consensus guidelines: Diagnostic Criteria for Nonviable Pregnancy Early in the First Trimester. [employer] 2013; 786:7544-92. Electronically Signed   By: Marcello Moores  Register   On: 10/12/2015 12:31   I have personally reviewed and evaluated these images and lab results as part of my medical decision-making.   EKG Interpretation None      MDM   Final diagnoses:  Pregnancy    This 29 year old female with 2 children at home and one miscarriage presenting with new pregnancy test in setting of abdominal cramping. Patient denies any blood or discharge. We will do vaginal exam, transvaginal ultrasound for location. She is unsure how far along she is. We will get beta hCG. We will give follow up for patient for this pregnancy.   4:17 PM Vag exam normal, Os closed.   TV ultrasound shows gestational sac and yolk sac. I talked with [facility] and they believe this is sufficient, does not need repeat beta testing. Patient is thinking that she may want to terminate this pregnancy I gave her [facility] information for this.     Usher Hedberg Julio Alm, MD 10/12/15 [phone]

## 2015-10-12 NOTE — ED Notes (Signed)
Per pt, states abdominal cramping and sore throat since last week

## 2015-10-14 LAB — CULTURE, GROUP A STREP: Strep A Culture: NEGATIVE

## 2015-10-17 ENCOUNTER — Inpatient Hospital Stay (HOSPITAL_COMMUNITY)
Admission: AD | Admit: 2015-10-17 | Discharge: 2015-10-17 | Disposition: A | Discharge status: Home / Self Care | Payer: Medicaid Other | Source: Ambulatory Visit | Attending: Family Medicine | Admitting: Family Medicine

## 2015-10-17 ENCOUNTER — Encounter (HOSPITAL_COMMUNITY): Payer: Self-pay | Admitting: *Deleted

## 2015-10-17 DIAGNOSIS — O9989 Other specified diseases and conditions complicating pregnancy, childbirth and the puerperium: Secondary | ICD-10-CM

## 2015-10-17 DIAGNOSIS — Z760 Encounter for issue of repeat prescription: Secondary | ICD-10-CM | POA: Diagnosis not present

## 2015-10-17 DIAGNOSIS — O23591 Infection of other part of genital tract in pregnancy, first trimester: Secondary | ICD-10-CM | POA: Insufficient documentation

## 2015-10-17 DIAGNOSIS — O26899 Other specified pregnancy related conditions, unspecified trimester: Secondary | ICD-10-CM

## 2015-10-17 DIAGNOSIS — Z3A01 Less than 8 weeks gestation of pregnancy: Secondary | ICD-10-CM | POA: Diagnosis not present

## 2015-10-17 DIAGNOSIS — N760 Acute vaginitis: Secondary | ICD-10-CM | POA: Diagnosis not present

## 2015-10-17 DIAGNOSIS — B9689 Other specified bacterial agents as the cause of diseases classified elsewhere: Secondary | ICD-10-CM

## 2015-10-17 DIAGNOSIS — O26891 Other specified pregnancy related conditions, first trimester: Secondary | ICD-10-CM | POA: Diagnosis present

## 2015-10-17 DIAGNOSIS — K5190 Ulcerative colitis, unspecified, without complications: Secondary | ICD-10-CM | POA: Diagnosis not present

## 2015-10-17 DIAGNOSIS — K625 Hemorrhage of anus and rectum: Secondary | ICD-10-CM | POA: Diagnosis not present

## 2015-10-17 DIAGNOSIS — R109 Unspecified abdominal pain: Secondary | ICD-10-CM | POA: Diagnosis not present

## 2015-10-17 DIAGNOSIS — O98811 Other maternal infectious and parasitic diseases complicating pregnancy, first trimester: Secondary | ICD-10-CM

## 2015-10-17 DIAGNOSIS — A499 Bacterial infection, unspecified: Secondary | ICD-10-CM

## 2015-10-17 LAB — URINALYSIS, ROUTINE W REFLEX MICROSCOPIC
BILIRUBIN URINE: NEGATIVE
Glucose, UA: NEGATIVE mg/dL
HGB URINE DIPSTICK: NEGATIVE
KETONES UR: NEGATIVE mg/dL
LEUKOCYTES UA: NEGATIVE
NITRITE: NEGATIVE
PROTEIN: NEGATIVE mg/dL
SPECIFIC GRAVITY, URINE: 1.02 (ref 1.005–1.030)
pH: 7 (ref 5.0–8.0)

## 2015-10-17 LAB — COMPREHENSIVE METABOLIC PANEL
ALBUMIN: 3.6 g/dL (ref 3.5–5.0)
ALT: 10 U/L — AB (ref 14–54)
ANION GAP: 5 (ref 5–15)
AST: 13 U/L — ABNORMAL LOW (ref 15–41)
Alkaline Phosphatase: 73 U/L (ref 38–126)
BUN: 7 mg/dL (ref 6–20)
CHLORIDE: 108 mmol/L (ref 101–111)
CO2: 24 mmol/L (ref 22–32)
Calcium: 9.2 mg/dL (ref 8.9–10.3)
Creatinine, Ser: 0.47 mg/dL (ref 0.44–1.00)
GFR calc non Af Amer: >60 mL/min (ref >60)
Glucose, Bld: 91 mg/dL (ref 65–99)
Potassium: 3.9 mmol/L (ref 3.5–5.1)
SODIUM: 137 mmol/L (ref 135–145)
Total Bilirubin: 0.4 mg/dL (ref 0.3–1.2)
Total Protein: 7.4 g/dL (ref 6.5–8.1)

## 2015-10-17 LAB — CBC WITH DIFFERENTIAL/PLATELET
BASOS PCT: 0 %
Basophils Absolute: 0 10*3/uL (ref 0.0–0.1)
EOS ABS: 0.2 10*3/uL (ref 0.0–0.7)
EOS PCT: 2 %
HCT: 35.9 % — ABNORMAL LOW (ref 36.0–46.0)
Hemoglobin: 12.3 g/dL (ref 12.0–15.0)
LYMPHS ABS: 2.9 10*3/uL (ref 0.7–4.0)
Lymphocytes Relative: 33 %
MCH: 30.1 pg (ref 26.0–34.0)
MCHC: 34.3 g/dL (ref 30.0–36.0)
MCV: 87.8 fL (ref 78.0–100.0)
Monocytes Absolute: 1.5 10*3/uL — ABNORMAL HIGH (ref 0.1–1.0)
Monocytes Relative: 17 %
Neutro Abs: 4.3 10*3/uL (ref 1.7–7.7)
Neutrophils Relative %: 49 %
PLATELETS: 258 10*3/uL (ref 150–400)
RBC: 4.09 MIL/uL (ref 3.87–5.11)
RDW: 13.7 % (ref 11.5–15.5)
WBC: 9.2 10*3/uL (ref 4.0–10.5)

## 2015-10-17 LAB — WET PREP, GENITAL
SPERM: NONE SEEN
TRICH WET PREP: NONE SEEN
Yeast Wet Prep HPF POC: NONE SEEN

## 2015-10-17 MED ORDER — METRONIDAZOLE 500 MG PO TABS: 500.0000 mg | ORAL_TABLET | Freq: Two times a day (BID) | ORAL | Status: DC

## 2015-10-17 MED ORDER — DICYCLOMINE HCL 10 MG PO CAPS
10.0000 mg | ORAL_CAPSULE | Freq: Once | ORAL | Status: AC
  Administered 2015-10-17: 10 mg via ORAL
  Filled 2015-10-17: qty 1

## 2015-10-17 NOTE — MAU Provider Note (Signed)
Chief Complaint: Vaginal Bleeding and Abdominal Pain   First Provider Initiated Contact with Patient 10/17/15 1222      SUBJECTIVE HPI Joyce Solomon is a 29 y.o. [id number] at 35w3dby LMP who presents to maternity admissions reporting vaginal bleeding for a week.  When I went to talk with her, she tells me "it's not really vaginal bleeding, its from my rectum when I have a bowel movement".  Denies cramping or vaginal bleeding.  Hx remarkable for Ulcerative Colitis with rectal bleeding. Dx'ed in 2007. Colonoscopy in 2015 showed Left sided Colitis.  Treated with Remicade and steroids She denies vaginal bleeding, vaginal itching/burning, urinary symptoms, h/a, dizziness, n/v, or fever/chills.    RN Note: Pt presents to MAU with complaints of vaginal bleeding that started over a week ago. [facility] Also states that she has ulcerative colitis. Lower abdominal cramping        Past Medical History  Diagnosis Date  . Anemia 2008    Iron deficiency.  s/p transfusions 2009, 2010. Inconsistent compliance with po Iron.   . [place] 2007  . Headache(784.0)   . Allergy     SEASONAL  . Non compliance w medication regimen 06/2015    stopped Remicade, may not have been taking Azulfidine.   Past Surgical History  Procedure Laterality Date  . Tonsillectomy    . Dilation and evacuation  08/21/2012    Procedure: DILATATION AND EVACUATION;  Surgeon: KLogan Bores MD;  [facility];  Service: Gynecology;  Laterality: N/A;  . Cesarean section N/A 12/09/2013    Procedure: Primary Cesarean Section Delivery Baby Girl @ 1959, Apgars 8/8;  Surgeon: KLogan Bores MD;  [facility];  Service: Obstetrics;  Laterality: N/A;  . Colonoscopy  01/19/2011    Dr GPenelope Coop no active UC  . Colonoscopy w/ biopsies  04/2014    Dr KDeatra Ina Left sided colitis from prox descending to rectum. iNo inflammation at and proximal to splenic flexure.    Social  History   Social History  . Marital Status: Single    Spouse Name: N/A  . Number of Children: 1  . Years of Education: N/A   Occupational History  . Not on file.   Social History Main Topics  . Smoking status: Former Smoker -- 3 years    Types: Cigarettes    Quit date: 05/16/2010  . Smokeless tobacco: Never Used  . Alcohol Use: No  . Drug Use: No  . Sexual Activity: Yes    Birth Control/ Protection: None, Injection   Other Topics Concern  . Not on file   Social History Narrative   No current facility-administered medications on file prior to encounter.   Current Outpatient Prescriptions on File Prior to Encounter  Medication Sig Dispense Refill  . mesalamine (LIALDA) 1.2 G EC tablet TAKE FOUR TABLETS BY MOUTH DAILY WITH BREAKFAST 120 tablet 0  . dicyclomine (BENTYL) 10 MG capsule Take 1 capsule (10 mg total) by mouth every 6 (six) hours as needed for spasms. (Patient not taking: Reported on 09/03/2015) 90 capsule 0  . nitrofurantoin, macrocrystal-monohydrate, (MACROBID) 100 MG capsule Take 1 capsule (100 mg total) by mouth 2 (two) times daily. X 7 days (Patient not taking: Reported on 09/03/2015) 14 capsule 0  . predniSONE (DELTASONE) 10 MG tablet Take four (4) tablets every morning and as directed (Patient not taking: Reported on 09/03/2015) 100 tablet 0  . traMADol (ULTRAM) 50 MG tablet  Take 1 tablet (50 mg total) by mouth every 6 (six) hours as needed for moderate pain. (Patient not taking: Reported on 10/12/2015) 15 tablet 0  . traZODone (DESYREL) 50 MG tablet Take 1 tablet (50 mg total) by mouth at bedtime. (Patient not taking: Reported on 09/03/2015) 30 tablet 0   Allergies  Allergen Reactions  . Latex Rash and Other (See Comments)    gloves  . Remicade [Infliximab] Itching    GIVEN BENADRYL 50MG INJECTION    ROS:  Review of Systems Review of Systems  Constitutional: Negative for fever and chills.  Gastrointestinal: Negative for nausea, vomiting, abdominal pain,  diarrhea and constipation.  Genitourinary: Negative for dysuria.  Musculoskeletal: Negative for back pain.  Neurological: Negative for dizziness and weakness.    I have reviewed patient's Past Medical Hx, Surgical Hx, Family Hx, Social Hx, medications and allergies.   Physical Exam  Patient Vitals for the past 24 hrs:  BP Temp Pulse Resp Height Weight  10/17/15 1207 - - - - 4' 11"  (1.499 m) 56.7 kg (125 lb)  10/17/15 1154 113/72 mmHg 97.8 F (36.6 C) 82 18 - -   Constitutional: Well-developed, well-nourished female in no acute distress.  Cardiovascular: normal rate Respiratory: normal effort GI: Abd soft, non-tender. Pos BS x 4    No frank bleeding from rectum MS: Extremities nontender, no edema, normal ROM Neurologic: Alert and oriented x 4.  GU: Neg CVAT.  PELVIC EXAM: Cervix pink, visually closed, without lesion, scant white creamy discharge, No Blood Seen, vaginal walls and external genitalia normal Bimanual exam: Cervix 0/long/high, firm, anterior, neg CMT, uterus nontender, nonenlarged, adnexa without tenderness, enlargement, or mass   Physical Exam LAB RESULTS Results for orders placed or performed during the hospital encounter of 10/17/15 (from the past 24 hour(s))  Urinalysis, Routine w reflex microscopic (not at [facility])     Status: None   Collection Time: 10/17/15 11:50 AM  Result Value Ref Range   Color, Urine YELLOW YELLOW   APPearance CLEAR CLEAR   Specific Gravity, Urine 1.020 1.005 - 1.030   pH 7.0 5.0 - 8.0   Glucose, UA NEGATIVE NEGATIVE mg/dL   Hgb urine dipstick NEGATIVE NEGATIVE   Bilirubin Urine NEGATIVE NEGATIVE   Ketones, ur NEGATIVE NEGATIVE mg/dL   Protein, ur NEGATIVE NEGATIVE mg/dL   Nitrite NEGATIVE NEGATIVE   Leukocytes, UA NEGATIVE NEGATIVE    --/--/B POS (11/22 1054)  IMAGING US Ob Comp Less 14 Wks  10/12/2015  CLINICAL DATA:  Pelvic pain. EXAM: OBSTETRIC <14 WK [place] AND TRANSVAGINAL OB US TECHNIQUE: Both transabdominal and transvaginal  ultrasound examinations were performed for complete evaluation of the gestation as well as the maternal uterus, adnexal regions, and pelvic cul-de-sac. Transvaginal technique was performed to assess early pregnancy. COMPARISON:  None. FINDINGS: Intrauterine gestational sac: Visualized/normal in shape. Yolk sac:  Present. Embryo:  Not visualized. Cardiac Activity: Not visualized. MSD: 1.0 cm  mm   5 w   5  d IMPRESSION: Intrauterine gestational sac with yolk sac. Probable early intrauterine gestational sac, but no fetal pole, or cardiac activity yet visualized. Recommend follow-up quantitative B-HCG levels and follow-up US in 14 days to confirm and assess viability. This recommendation follows SRU consensus guidelines: Diagnostic Criteria for Nonviable Pregnancy Early in the First Trimester. [employer] [employer] 2013; 749:4496-75. Electronically Signed   By: Marcello Moores  Register   On: 10/12/2015 12:31   US Ob Transvaginal  10/12/2015  CLINICAL DATA:  Pelvic pain. EXAM: OBSTETRIC <14 WK [place]  AND TRANSVAGINAL OB US TECHNIQUE: Both transabdominal and transvaginal ultrasound examinations were performed for complete evaluation of the gestation as well as the maternal uterus, adnexal regions, and pelvic cul-de-sac. Transvaginal technique was performed to assess early pregnancy. COMPARISON:  None. FINDINGS: Intrauterine gestational sac: Visualized/normal in shape. Yolk sac:  Present. Embryo:  Not visualized. Cardiac Activity: Not visualized. MSD: 1.0 cm  mm   5 w   5  d IMPRESSION: Intrauterine gestational sac with yolk sac. Probable early intrauterine gestational sac, but no fetal pole, or cardiac activity yet visualized. Recommend follow-up quantitative B-HCG levels and follow-up US in 14 days to confirm and assess viability. This recommendation follows SRU consensus guidelines: Diagnostic Criteria for Nonviable Pregnancy Early in the First Trimester. [employer] 2013; 195:0932-67. Electronically Signed   By: Marcello Moores  Register    On: 10/12/2015 12:31    MAU Management/MDM: Ordered labs (Cultures, wet prep, CBC, HIV, and CMET) and reviewed results.   Cultures not done in ED, so done now. Consult Dr Kennon Rounds who recommends follow up with GI specialist.   Bentyl given for abdominal cramps with good relief. Pt stable at time of discharge.  Results for orders placed or performed during the hospital encounter of 10/17/15 (from the past 24 hour(s))  Urinalysis, Routine w reflex microscopic (not at [facility])     Status: None   Collection Time: 10/17/15 11:50 AM  Result Value Ref Range   Color, Urine YELLOW YELLOW   APPearance CLEAR CLEAR   Specific Gravity, Urine 1.020 1.005 - 1.030   pH 7.0 5.0 - 8.0   Glucose, UA NEGATIVE NEGATIVE mg/dL   Hgb urine dipstick NEGATIVE NEGATIVE   Bilirubin Urine NEGATIVE NEGATIVE   Ketones, ur NEGATIVE NEGATIVE mg/dL   Protein, ur NEGATIVE NEGATIVE mg/dL   Nitrite NEGATIVE NEGATIVE   Leukocytes, UA NEGATIVE NEGATIVE  CBC with Differential/Platelet     Status: Abnormal   Collection Time: 10/17/15 12:30 PM  Result Value Ref Range   WBC 9.2 4.0 - 10.5 K/uL   RBC 4.09 3.87 - 5.11 MIL/uL   Hemoglobin 12.3 12.0 - 15.0 g/dL   HCT 35.9 (L) 36.0 - 46.0 %   MCV 87.8 78.0 - 100.0 fL   MCH 30.1 26.0 - 34.0 pg   MCHC 34.3 30.0 - 36.0 g/dL   RDW 13.7 11.5 - 15.5 %   Platelets 258 150 - 400 K/uL   Neutrophils Relative % 49 %   Neutro Abs 4.3 1.7 - 7.7 K/uL   Lymphocytes Relative 33 %   Lymphs Abs 2.9 0.7 - 4.0 K/uL   Monocytes Relative 17 %   Monocytes Absolute 1.5 (H) 0.1 - 1.0 K/uL   Eosinophils Relative 2 %   Eosinophils Absolute 0.2 0.0 - 0.7 K/uL   Basophils Relative 0 %   Basophils Absolute 0.0 0.0 - 0.1 K/uL  Comprehensive metabolic panel     Status: Abnormal   Collection Time: 10/17/15 12:30 PM  Result Value Ref Range   Sodium 137 135 - 145 mmol/L   Potassium 3.9 3.5 - 5.1 mmol/L   Chloride 108 101 - 111 mmol/L   CO2 24 22 - 32 mmol/L   Glucose, Bld 91 65 - 99 mg/dL   BUN 7 6  - 20 mg/dL   Creatinine, Ser 0.47 0.44 - 1.00 mg/dL   Calcium 9.2 8.9 - 10.3 mg/dL   Total Protein 7.4 6.5 - 8.1 g/dL   Albumin 3.6 3.5 - 5.0 g/dL   AST 13 (L)  15 - 41 U/L   ALT 10 (L) 14 - 54 U/L   Alkaline Phosphatase 73 38 - 126 U/L   Total Bilirubin 0.4 0.3 - 1.2 mg/dL   GFR calc non Af Amer >60 >60 mL/min   GFR calc Af Amer >60 >60 mL/min   Anion gap 5 5 - 15  Wet prep, genital     Status: Abnormal   Collection Time: 10/17/15 12:30 PM  Result Value Ref Range   Yeast Wet Prep HPF POC NONE SEEN NONE SEEN   Trich, Wet Prep NONE SEEN NONE SEEN   Clue Cells Wet Prep HPF POC PRESENT (A) NONE SEEN   WBC, Wet Prep HPF POC FEW (A) NONE SEEN   Sperm NONE SEEN     ASSESSMENT Pregnancy at [redacted]w[redacted]d Ulcerative Colitis with rectal bleeding Bacterial Vaginosis  PLAN Discharge home Rx Flagyl for BV Recommend followup with GI specialist for Ulcerative Colitis Has Bentyl at home Follow up with prenatal care    Medication List    ASK your doctor about these medications        dicyclomine 10 MG capsule  Commonly known as:  BENTYL  Take 1 capsule (10 mg total) by mouth every 6 (six) hours as needed for spasms.     mesalamine 1.2 G EC tablet  Commonly known as:  LIALDA  TAKE FOUR TABLETS BY MOUTH DAILY WITH BREAKFAST     nitrofurantoin (macrocrystal-monohydrate) 100 MG capsule  Commonly known as:  MACROBID  Take 1 capsule (100 mg total) by mouth 2 (two) times daily. X 7 days     predniSONE 10 MG tablet  Commonly known as:  DELTASONE  Take four (4) tablets every morning and as directed     traMADol 50 MG tablet  Commonly known as:  ULTRAM  Take 1 tablet (50 mg total) by mouth every 6 (six) hours as needed for moderate pain.     traZODone 50 MG tablet  Commonly known as:  DESYREL  Take 1 tablet (50 mg total) by mouth at bedtime.         MHansel FeinsteinCNM, MSN Certified Nurse-Midwife 10/17/2015  12:38 PM

## 2015-10-17 NOTE — MAU Note (Signed)
Pt presents to MAU with complaints of vaginal bleeding that started over a week ago. Pt has had an ultrasound at [facility]. Also states that she has ulcerative colitis. Lower abdominal cramping

## 2015-10-17 NOTE — Discharge Instructions (Signed)
Bacterial Vaginosis Bacterial vaginosis is a vaginal infection that occurs when the normal balance of bacteria in the vagina is disrupted. It results from an overgrowth of certain bacteria. This is the most common vaginal infection in women of childbearing age. Treatment is important to prevent complications, especially in pregnant women, as it can cause a premature delivery. CAUSES  Bacterial vaginosis is caused by an increase in harmful bacteria that are normally present in smaller amounts in the vagina. Several different kinds of bacteria can cause bacterial vaginosis. However, the reason that the condition develops is not fully understood. RISK FACTORS Certain activities or behaviors can put you at an increased risk of developing bacterial vaginosis, including:  Having a new sex partner or multiple sex partners.  Douching.  Using an intrauterine device (IUD) for contraception. Women do not get bacterial vaginosis from toilet seats, bedding, swimming pools, or contact with objects around them. SIGNS AND SYMPTOMS  Some women with bacterial vaginosis have no signs or symptoms. Common symptoms include:  Grey vaginal discharge.  A fishlike odor with discharge, especially after sexual intercourse.  Itching or burning of the vagina and vulva.  Burning or pain with urination. DIAGNOSIS  Your health care provider will take a medical history and examine the vagina for signs of bacterial vaginosis. A sample of vaginal fluid may be taken. Your health care provider will look at this sample under a microscope to check for bacteria and abnormal cells. A vaginal pH test may also be done.  TREATMENT  Bacterial vaginosis may be treated with antibiotic medicines. These may be given in the form of a pill or a vaginal cream. A second round of antibiotics may be prescribed if the condition comes back after treatment. Because bacterial vaginosis increases your risk for sexually transmitted diseases, getting  treated can help reduce your risk for chlamydia, gonorrhea, HIV, and herpes. HOME CARE INSTRUCTIONS   Only take over-the-counter or prescription medicines as directed by your health care provider.  If antibiotic medicine was prescribed, take it as directed. Make sure you finish it even if you start to feel better.  Tell all sexual partners that you have a vaginal infection. They should see their health care provider and be treated if they have problems, such as a mild rash or itching.  During treatment, it is important that you follow these instructions:  Avoid sexual activity or use condoms correctly.  Do not douche.  Avoid alcohol as directed by your health care provider.  Avoid breastfeeding as directed by your health care provider. SEEK MEDICAL CARE IF:   Your symptoms are not improving after 3 days of treatment.  You have increased discharge or pain.  You have a fever. MAKE SURE YOU:   Understand these instructions.  Will watch your condition.  Will get help right away if you are not doing well or get worse. FOR MORE INFORMATION  Centers for Disease Control and Prevention, Division of STD Prevention: Appraiser[employer]raud[employer]fi American Sexual Health Association (ASHA): www.ashastd.org    This information is not intended to replace advice given to you by your health care provider. Make sure you discuss any questions you have with your health care provider.   Document Released: 11/06/2005 Document Revised: 11/27/2014 Document Reviewed: 06/18/2013 Elsevier Interactive Patient Education 2016 Elsevier Inc. Ulcerative Colitis, Adult Ulcerative colitis is long-lasting (chronic) swelling (inflammation) of the large intestine (colon). Sores (ulcers) may also form on the colon.  Ulcerative colitis is closely related to another condition of inflammation of the intestines  that is called Crohn disease. Together, they are frequently referred to as inflammatory bowel disease (IBD).  CAUSES    Ulcerative colitis is caused by increased activity of the immune system in the intestines. The immune system is the system that protects the body against harmful bacteria, viruses, fungi, and other things that can make you sick. When the immune system overacts, it causes inflammation. The cause of the increased immune system activity is not known.  RISK FACTORS Risk factors of ulcerative colitis include:   Age. This includes:  Being 9-88 years old.  Being older than 30 years old.  Having a family history of ulcerative colitis.   Being of Jewish descent. SIGNS AND SYMPTOMS  Common symptoms of ulcerative colitis include rectal bleeding and diarrhea. There is a wide range of symptoms, and a person's symptoms depend on how severe the condition is. Additional symptoms may include:   Pain or cramping in the belly (abdomen).   Fever.   Fatigue.   Weight loss.   Night sweats.   Rectal pain.   Feeling the immediate need to have a bowel movement.   Nausea.  Loss of appetite.  Anemia.  Joint pain or soreness.  Eye irritation.  Certain skin rashes. DIAGNOSIS  Ulcerative colitis may be diagnosed by:  Medical history and physical exam.  Blood tests and stool tests.  X-rays.  CT scans.  Colonoscopy. For this test, a flexible tube is inserted into your anus and your colon is examined.  Examination of a tissue sample from your colon (biopsy). TREATMENT  Treatment for ulcerative colitis may include medicines to:  Decrease inflammation.  Control your immune system. Surgery may also be necessary.  HOME CARE INSTRUCTIONS  Medicines and Vitamins  Take medicines only as directed by your doctor. Do not take aspirin.  Ask your doctor if you should take any vitamins or supplements. Lifestyle  Exercise regularly.  Limit alcohol intake to no more than 1 drink per day for nonpregnant women and 2 drinks per day for men. One drink equals 12 ounces of beer, 5 ounces  of wine, or 1 ounces of hard liquor. Eating and Drinking  Drink enough fluid to keep your urine clear or pale yellow.  Ask your health care provider about the best diet for you. Follow the diet as directed by your health care provider. This may include:  Avoiding carbonated drinks.  Avoiding popcorn, vegetable skins, nuts, and other high-fiber foods when you have symptoms of ulcerative colitis.  Eating smaller meals more often.  Keeping a food diary. This may help you to find and avoid any foods that make you feel not well.  Limit your caffeine intake. General Instructions  Keep all follow-up appointments as directed by your health care provider. This is important. SEEK MEDICAL CARE IF:   Your symptoms do not improve or get worse with treatment.   You continue to lose weight.  You have constant cramps or loose bowels.   You develop a new skin rash, skin sores, or eye problems.  You have a fever or chills. SEEK IMMEDIATE MEDICAL CARE IF:   You have bloody diarrhea.   You have severe pain in your abdomen.   You vomit.   This information is not intended to replace advice given to you by your health care provider. Make sure you discuss any questions you have with your health care provider.   Document Released: 08/16/2005 Document Revised: 07/28/2015 Document Reviewed: 06/24/2014 Elsevier Interactive Patient Education [employer].

## 2015-10-18 LAB — GC/CHLAMYDIA PROBE AMP (~~LOC~~) NOT AT ARMC
Chlamydia: NEGATIVE
NEISSERIA GONORRHEA: NEGATIVE

## 2015-10-18 LAB — HIV ANTIBODY (ROUTINE TESTING W REFLEX): HIV SCREEN 4TH GENERATION: NONREACTIVE

## 2015-10-21 ENCOUNTER — Encounter: Payer: Self-pay | Admitting: Physician Assistant

## 2015-10-21 ENCOUNTER — Ambulatory Visit (INDEPENDENT_AMBULATORY_CARE_PROVIDER_SITE_OTHER): Payer: Medicaid Other | Admitting: Physician Assistant

## 2015-10-21 VITALS — BP 120/76 | HR 104 | Ht 59.75 in | Wt 133.1 lb

## 2015-10-21 DIAGNOSIS — K51511 Left sided colitis with rectal bleeding: Secondary | ICD-10-CM

## 2015-10-21 MED ORDER — ONDANSETRON HCL 4 MG PO TABS: ORAL_TABLET | ORAL | Status: DC

## 2015-10-21 MED ORDER — DICYCLOMINE HCL 10 MG PO CAPS: 10.0000 mg | ORAL_CAPSULE | Freq: Four times a day (QID) | ORAL | Status: DC | PRN

## 2015-10-21 MED ORDER — MESALAMINE 1.2 G PO TBEC: DELAYED_RELEASE_TABLET | ORAL | Status: DC

## 2015-10-21 MED ORDER — PREDNISONE 20 MG PO TABS: ORAL_TABLET | ORAL | Status: DC

## 2015-10-21 NOTE — Progress Notes (Signed)
Reviewed and agree with documentation and assessment and plan. Entyvio or Stelara likely would be a good choice but if patient decides to continue pregnancy may have to consider Cimzia and will need to coordinate her care with high -risk OB K. Denzil Magnuson , MD

## 2015-10-21 NOTE — Patient Instructions (Addendum)
We sent prescriptions to [address] 1. Lialda 1.2 g  2. Zofran ( Ondansetron) 4 mg. 3. Bentyl ( Dicyclomine) 10 mg 4. Prednisone 20 mg.   Prednisone 20 mg: Take 1 tab x 14 days.                                  Take 1 1/2 tab x 14 days                                 Take 1/2 tab x 14 days.   We will call you about the Entyvio medication.

## 2015-10-21 NOTE — Progress Notes (Signed)
Patient ID: Joyce Solomon, female   DOB: Jul 15, 1986, 29 y.o.   MRN: [id number]   Subjective:    Patient ID: Joyce Solomon, female    DOB: 1986/11/17, 29 y.o.   MRN: [id number]  HPI Joyce Solomon   Is a 29 year old African-American female previously known to Dr. Deatra Ina with history of left-sided ulcerative colitis. Last colonoscopy was done in June 2015 which showed moderately severe left-sided colitis into the rectum. She had been started on Remicade in  2015 and when seen here in August 2016 she had stopped the Remicade for several months on her own accord and also had stopped taking Azulfidine. She was started on Lialda at that time and rescheduled for Remicade infusions. She has been going for the infusions over the past few months the last was done on October 18 however she developed diffuse pruritus with this infusion. Infusion had to be stopped , and decision was made not to pursue any further Remicade. Patient has had 2 appointments here since that she canceled. She did have labs drawn in October that did show positive infliximab antibody the level of 1622 and a drug level of less than 0.4.   she's not been on any medications over the past month. She comes in today stating she's not been feeling well because she's had nausea and vomiting the past few days but also has small children at home sick. She also tells me that she just found out that she was pregnant and is about [redacted] weeks pregnant at this point. She has 4 children I believe and does not intend to keep this pregnancy. She says she had an appointment with GYN earlier this week which she canceled but is rescheduling to have a D&C. [name]  She says she's been having 5-6 diarrheal stools per day with lower abdominal cramping and sees blood with about every bowel movement. She did feel that the Lialda helped her when she was on it.  Review of Systems  Pertinent positive and negative review of systems were noted in the above HPI section.  All other  review of systems was otherwise negative.  Outpatient Encounter Prescriptions as of 10/21/2015  Medication Sig  . dicyclomine (BENTYL) 10 MG capsule Take 1 capsule (10 mg total) by mouth every 6 (six) hours as needed for spasms.  . mesalamine (LIALDA) 1.2 G EC tablet TAKE FOUR TABLETS BY MOUTH DAILY WITH BREAKFAST  . [DISCONTINUED] dicyclomine (BENTYL) 10 MG capsule Take 1 capsule (10 mg total) by mouth every 6 (six) hours as needed for spasms.  . [DISCONTINUED] mesalamine (LIALDA) 1.2 G EC tablet TAKE FOUR TABLETS BY MOUTH DAILY WITH BREAKFAST  . ondansetron (ZOFRAN) 4 MG tablet Take 1 tab every 6 hours as needed for nausea.  . predniSONE (DELTASONE) 20 MG tablet Take 1 tab x 14 days, then 1 1/2 tab x 14 days, then 1/2 tablet x 14 days.  . [DISCONTINUED] metroNIDAZOLE (FLAGYL) 500 MG tablet Take 1 tablet (500 mg total) by mouth 2 (two) times daily.   Facility-Administered Encounter Medications as of 10/21/2015  Medication  . acetaminophen (TYLENOL) tablet 650 mg  . inFLIXimab (REMICADE) 5 mg/kg = 300 mg in sodium chloride 0.9 % 250 mL infusion   Allergies  Allergen Reactions  . Latex Rash and Other (See Comments)    gloves  . Remicade [Infliximab] Itching    GIVEN BENADRYL 50MG INJECTION   Patient Active Problem List   Diagnosis Date Noted  . Rectal bleeding 04/23/2014  . Status  post C-section 12/10/2013  . IUGR (intrauterine growth restriction) 12/09/2013  . DENTAL CARIES 12/22/2009  . HYPERTHYROIDISM 08/03/2008  . CONSTIPATION 07/29/2008  . ACNE, MILD 07/29/2008  . ANEMIA 06/30/2008  . Ulcerative colitis ([place]) 11/20/2005   Social History   Social History  . Marital Status: Single    Spouse Name: N/A  . Number of Children: 1  . Years of Education: N/A   Occupational History  . Not on file.   Social History Main Topics  . Smoking status: Former Smoker -- 3 years    Types: Cigarettes    Quit date: 05/16/2010  . Smokeless tobacco: Never Used  . Alcohol Use: No  .  Drug Use: No  . Sexual Activity: Yes    Birth Control/ Protection: None, Injection   Other Topics Concern  . Not on file   Social History Narrative    [name]'s family history includes Miscarriages / Stillbirths in an other family member. There is no history of Hypotension, Alcohol abuse, Arthritis, Asthma, Birth defects, COPD, Depression, Diabetes, Drug abuse, Hearing loss, Heart disease, Hyperlipidemia, Hypertension, Kidney disease, Learning disabilities, Mental illness, or Mental retardation.      Objective:    Filed Vitals:   10/21/15 1001  BP: 120/76  Pulse: 104    Physical Exam   Well-developed young African-American female in no acute distress, blood pressure 120/76 pulse 100  height4 foot 11, weight 133. HEENT; nontraumatic, cephalic EOMI PERRLA sclera anicteric, Cardia vascular ;regular rate and rhythm with S1-S2 no murmur or gallop, Pulmonary;clear bilaterally, Abdomen ;soft she has tenderness bilaterally in the lower quadrants left greater than right no guarding or rebound no palpable mass or hepatosplenomegaly bowel sounds are present, Rectal; exam not done, Ext;no clubbing cyanosis or edema skin warm and dry, Neuro/psych; Mood and affect appropriate       Assessment & Plan:   #1 29 yo female with mod-severe left sided Ulcerative colitis- active sxs, and on no current treatment Has had compliance issues with meds, restarted Remicade 06/2015 then infusion rxn 10/18, and discontinued, also has Positive infliximab AB #2 early  IUP-5 weeks- pt plans to terminate- pt to schedule  D and C.  Plan; restart Lialda 4.8 gm po daily Start prednisone 20 mg po qa, x 2 weeks then 15 mg daily x 2 weeks then 10 mg daily x 2 weeks Bentyl 10 mg po q 6 hours prn Zofran 4 mg po q 6 hours prn nausea #30/0 Believe would be a better candidate for infusion Biologic given issues with compliance, and Entyvio may be good choice -will discuss with Dr Ladona Mow who she will be established  with- then initiate approval process.  Follow up appt with Dr Silverio Decamp in one month Will need to reevaluate and be certain pt follows thru with termination of pregnancy before any other medication changes made    Amy Genia Harold PA-C 10/21/2015   Cc: [facility]

## 2015-10-22 ENCOUNTER — Telehealth: Payer: Self-pay | Admitting: *Deleted

## 2015-10-22 NOTE — Telephone Encounter (Signed)
L/M for patient that appointment time on 12/13 has been changed to  1:00. That the office will be closing early

## 2015-10-25 ENCOUNTER — Encounter: Payer: Self-pay | Admitting: Gastroenterology

## 2015-11-02 ENCOUNTER — Ambulatory Visit (INDEPENDENT_AMBULATORY_CARE_PROVIDER_SITE_OTHER): Payer: Medicaid Other | Admitting: Gastroenterology

## 2015-11-02 ENCOUNTER — Encounter: Payer: Self-pay | Admitting: Gastroenterology

## 2015-11-02 VITALS — BP 106/68 | HR 76 | Ht 59.75 in | Wt 135.4 lb

## 2015-11-02 DIAGNOSIS — K5180 Other ulcerative colitis without complications: Secondary | ICD-10-CM | POA: Diagnosis not present

## 2015-11-02 NOTE — Progress Notes (Signed)
Joyce Solomon    [id number]    1986/02/25  Primary Care Physician:[facility]  Referring Physician: [place], [place] [address]  Chief complaint:  Ulcerative colitis  HPI:  29 year old African-American female former patient of Dr. Deatra Ina with history of left-sided ulcerative colitis. Last colonoscopy was done in June 2015 which showed moderately severe left-sided colitis into the rectum. She was started on Remicade in2015 and when seen here in August 2016 she had stopped the Remicade for several months on her own accord and also had stopped taking Azulfidine. She was started on Lialda at that time and rescheduled for Remicade infusions. She has been going for the infusions over the past few months the last was done on September 07, 2015 however she developed diffuse pruritus with this infusion. Infusion had to be stopped , and decision was made not to pursue any further Remicade. She did have positive infliximab antibody the level of 1622 and a drug level of less than 0.4 at the time in October 2016 She was started on prednisone taper for 2 weeks starting at 20 mg daily when she last saw Nicoletta Ba on 10/21/2015, patient said she just took it for a week and she's been off it since last week. She couldn't find the pills so she stopped taking them. She is having 3-4 bowel movements per day decreased in frequency compared to when it started 2 weeks ago no mucus or blood in stool. She is pregnant and is about [redacted] weeks pregnant at this point. She has 4 children I believe and does not intend to keep this pregnancy.   Outpatient Encounter Prescriptions as of 11/02/2015  Medication Sig  . dicyclomine (BENTYL) 10 MG capsule Take 1 capsule (10 mg total) by mouth every 6 (six) hours as needed for spasms.  . mesalamine (LIALDA) 1.2 G EC tablet TAKE FOUR TABLETS BY MOUTH DAILY WITH BREAKFAST  . ondansetron (ZOFRAN) 4 MG tablet Take 1 tab every 6  hours as needed for nausea.  . predniSONE (DELTASONE) 20 MG tablet Take 1 tab x 14 days, then 1 1/2 tab x 14 days, then 1/2 tablet x 14 days.   Facility-Administered Encounter Medications as of 11/02/2015  Medication  . acetaminophen (TYLENOL) tablet 650 mg  . inFLIXimab (REMICADE) 5 mg/kg = 300 mg in sodium chloride 0.9 % 250 mL infusion    Allergies as of 11/02/2015 - Review Complete 11/02/2015  Allergen Reaction Noted  . Latex Rash and Other (See Comments) 05/20/2009  . Remicade [infliximab] Itching 08/07/2015    Past Medical History  Diagnosis Date  . Anemia 2008    Iron deficiency.  s/p transfusions 2009, 2010. Inconsistent compliance with po Iron.   . Chronic ulcerative colitis ([place]) 2007  . Headache(784.0)   . Allergy     SEASONAL  . Non compliance w medication regimen 06/2015    stopped Remicade, may not have been taking Azulfidine.    Past Surgical History  Procedure Laterality Date  . Tonsillectomy    . Dilation and evacuation  08/21/2012    Procedure: DILATATION AND EVACUATION;  Surgeon: Logan Bores, MD;  Location: [facility];  Service: Gynecology;  Laterality: N/A;  . Cesarean section N/A 12/09/2013    Procedure: Primary Cesarean Section Delivery Baby Girl @ 1959, Apgars 8/8;  Surgeon: Logan Bores, MD;  Location: [facility];  Service: Obstetrics;  Laterality: N/A;  . Colonoscopy  01/19/2011  Dr Penelope Coop. no active UC  . Colonoscopy w/ biopsies  04/2014    Dr Deatra Ina. Left sided colitis from prox descending to rectum. iNo inflammation at and proximal to splenic flexure.     Family History  Problem Relation Age of Onset  . Hypotension Neg Hx   . Alcohol abuse Neg Hx   . Arthritis Neg Hx   . Asthma Neg Hx   . Birth defects Neg Hx   . COPD Neg Hx   . Depression Neg Hx   . Diabetes Neg Hx   . Drug abuse Neg Hx   . Hearing loss Neg Hx   . Heart disease Neg Hx   . Hyperlipidemia Neg Hx   . Hypertension Neg Hx   . Kidney disease Neg Hx   . Learning  disabilities Neg Hx   . Mental illness Neg Hx   . Mental retardation Neg Hx   . Miscarriages / [place]      Social History   Social History  . Marital Status: Single    Spouse Name: N/A  . Number of Children: 1  . Years of Education: N/A   Occupational History  . Not on file.   Social History Main Topics  . Smoking status: Former Smoker -- 3 years    Types: Cigarettes    Quit date: 05/16/2010  . Smokeless tobacco: Never Used  . Alcohol Use: No  . Drug Use: No  . Sexual Activity: Yes    Birth Control/ Protection: None, Injection   Other Topics Concern  . Not on file   Social History Narrative      Review of systems: Review of Systems  Constitutional: Negative for fever and chills.  HENT: Negative.   Eyes: Negative for blurred vision.  Respiratory: Negative for cough, shortness of breath and wheezing.   Cardiovascular: Negative for chest pain and palpitations.  Gastrointestinal: as per HPI Genitourinary: Negative for dysuria, urgency, frequency and hematuria.  Musculoskeletal: Negative for myalgias, back pain and joint pain.  Skin: Negative for itching and rash.  Neurological: Negative for dizziness, tremors, focal weakness, seizures and loss of consciousness.  Endo/Heme/Allergies: Negative for environmental allergies.  Psychiatric/Behavioral: Negative for depression, suicidal ideas and hallucinations.  All other systems reviewed and are negative.   Physical Exam: Filed Vitals:   11/02/15 1257  BP: 106/68  Pulse: 76   Gen:      No acute distress HEENT:  EOMI, sclera anicteric Neck:     No masses; no thyromegaly Lungs:    Clear to auscultation bilaterally; normal respiratory effort CV:         Regular rate and rhythm; no murmurs Abd:      + bowel sounds; soft, non-tender; no palpable masses, no distension Ext:    No edema; adequate peripheral perfusion Skin:      Warm and dry; no rash Neuro: alert and oriented x 3 Psych: normal mood and  affect  Data Reviewed:  Reviewed recent labs and colonoscopy from 2015   Assessment and Plan/Recommendations:  29 year old female with history of left-sided ulcerative colitis with recent flare 2 weeks ago was started on prednisone taper but patient did not finish the taper with persistent increased bowel frequency of 3-4 bowel movements per day here for follow-up visit check C. Difficile Continue Lialda Flexible sigmoidoscopy  to assess disease activity We will wait until patient has D&C to initiate Biologics  K. Denzil Magnuson , MD [phone] Mon-Fri 8a-5p [phone] after 5p, weekends, holidays

## 2015-11-02 NOTE — Patient Instructions (Signed)
You have been scheduled for a flexible sigmoidoscopy. Please follow the written instructions given to you at your visit today. If you use inhalers (even only as needed), please bring them with you on the day of your procedure.  We will refill your Lialda today  Follow up with Dr Silverio Decamp on 01/05/2016 at 8:45am

## 2015-11-03 ENCOUNTER — Other Ambulatory Visit: Payer: Self-pay | Admitting: *Deleted

## 2015-11-03 ENCOUNTER — Telehealth: Payer: Self-pay | Admitting: *Deleted

## 2015-11-03 MED ORDER — MESALAMINE ER 0.375 G PO CP24: ORAL_CAPSULE | ORAL | Status: DC

## 2015-11-03 NOTE — Telephone Encounter (Signed)
Called the patient to advise the insurance company, [place], would not approve the Lialda 1.2 g,  They suggested she has to try their preferred drugs first, Apriso, Azulfidine, Sulfasazine.  The co-pay for her would be $3.00.  I advised we sent a prescription for the Apriso 0.375 mg to [facility] per Nicoletta Ba PA.  She is to take 4 tablets in the morning daily.

## 2015-12-03 ENCOUNTER — Telehealth: Payer: Self-pay | Admitting: Gastroenterology

## 2015-12-03 NOTE — Telephone Encounter (Signed)
Pt called and thought she was picking up a prescription for her flex on Monday.. I advised that the mag citrate and the fleet are over the counter.  The pt lost her instructions and I did provide her with the My Chart code and instructed her to sign up and access the instructions that way. The pt agreed and verbalized understanding

## 2015-12-06 ENCOUNTER — Telehealth: Payer: Self-pay | Admitting: Gastroenterology

## 2015-12-06 ENCOUNTER — Other Ambulatory Visit: Payer: Medicaid Other | Admitting: Gastroenterology

## 2015-12-06 NOTE — Telephone Encounter (Signed)
She was given instructions during her office visit, No charges for this time but if she continues to no show or cancel her appointments and is non compliant we may have to discharge her from our practice

## 2015-12-07 ENCOUNTER — Ambulatory Visit (AMBULATORY_SURGERY_CENTER): Payer: Self-pay | Admitting: *Deleted

## 2015-12-07 VITALS — Ht 59.0 in | Wt 137.6 lb

## 2015-12-07 DIAGNOSIS — K5100 Ulcerative (chronic) pancolitis without complications: Secondary | ICD-10-CM

## 2015-12-07 NOTE — Progress Notes (Signed)
No egg or soy allergy known to patient  No issues with past sedation with any surgeries  or procedures, no intubation problems  No diet pills per patient  No home 02 use per patient  No blood thinners  Pt states she is no longer pregnant

## 2015-12-17 ENCOUNTER — Ambulatory Visit (AMBULATORY_SURGERY_CENTER): Payer: Medicaid Other | Admitting: Gastroenterology

## 2015-12-17 ENCOUNTER — Encounter: Payer: Self-pay | Admitting: Gastroenterology

## 2015-12-17 VITALS — BP 123/89 | HR 75 | Temp 97.9°F | Resp 20 | Ht 59.0 in | Wt 137.0 lb

## 2015-12-17 DIAGNOSIS — K5100 Ulcerative (chronic) pancolitis without complications: Secondary | ICD-10-CM | POA: Diagnosis not present

## 2015-12-17 MED ORDER — SODIUM CHLORIDE 0.9 % IV SOLN: 500.0000 mL | INTRAVENOUS | Status: DC

## 2015-12-17 NOTE — Progress Notes (Signed)
Poor historian regarding family history.

## 2015-12-17 NOTE — Progress Notes (Signed)
Called to room to assist during endoscopic procedure.  Patient ID and intended procedure confirmed with present staff. Received instructions for my participation in the procedure from the performing physician.  

## 2015-12-17 NOTE — Patient Instructions (Signed)
YOU HAD AN ENDOSCOPIC PROCEDURE TODAY AT [facility]:   Refer to the procedure report that was given to you for any specific questions about what was found during the examination.  If the procedure report does not answer your questions, please call your gastroenterologist to clarify.  If you requested that your care partner not be given the details of your procedure findings, then the procedure report has been included in a sealed envelope for you to review at your convenience later.  YOU SHOULD EXPECT: Some feelings of bloating in the abdomen. Passage of more gas than usual.  Walking can help get rid of the air that was put into your GI tract during the procedure and reduce the bloating. If you had a lower endoscopy (such as a colonoscopy or flexible sigmoidoscopy) you may notice spotting of blood in your stool or on the toilet paper. If you underwent a bowel prep for your procedure, you may not have a normal bowel movement for a few days.  Please Note:  You might notice some irritation and congestion in your nose or some drainage.  This is from the oxygen used during your procedure.  There is no need for concern and it should clear up in a day or so.  SYMPTOMS TO REPORT IMMEDIATELY:   Following lower endoscopy (colonoscopy or flexible sigmoidoscopy):  Excessive amounts of blood in the stool  Significant tenderness or worsening of abdominal pains  Swelling of the abdomen that is new, acute  Fever of 100F or higher   For urgent or emergent issues, a gastroenterologist can be reached at any hour by calling [phone].   DIET: Your first meal following the procedure should be a small meal and then it is ok to progress to your normal diet. Heavy or fried foods are harder to digest and may make you feel nauseous or bloated.  Likewise, meals heavy in dairy and vegetables can increase bloating.  Drink plenty of fluids but you should avoid alcoholic beverages for 24  hours.  ACTIVITY:  You should plan to take it easy for the rest of today and you should NOT DRIVE or use heavy machinery until tomorrow (because of the sedation medicines used during the test).    FOLLOW UP: Our staff will call the number listed on your records the next business day following your procedure to check on you and address any questions or concerns that you may have regarding the information given to you following your procedure. If we do not reach you, we will leave a message.  However, if you are feeling well and you are not experiencing any problems, there is no need to return our call.  We will assume that you have returned to your regular daily activities without incident.  If any biopsies were taken you will be contacted by phone or by letter within the next 1-3 weeks.  Please call us at [phone] if you have not heard about the biopsies in 3 weeks.    SIGNATURES/CONFIDENTIALITY: You and/or your care partner have signed paperwork which will be entered into your electronic medical record.  These signatures attest to the fact that that the information above on your After Visit Summary has been reviewed and is understood.  Full responsibility of the confidentiality of this discharge information lies with you and/or your care-partner.  Wait biopsies  Continue Cimzia  Continue Mesalamine  Office follow-up, office will call you to schedule this, if you have not heard from  anyone by Tuesday 1/31 please call the office.

## 2015-12-17 NOTE — Progress Notes (Signed)
Report to PACU, RN, vss, BBS= Clear.  

## 2015-12-17 NOTE — Progress Notes (Signed)
Verbal confirmed with Dr Silverio Decamp, procedure is Sigmoidoscopy.

## 2015-12-17 NOTE — Op Note (Signed)
[place]  [address] [place], [id number]   FLEX SIGMOIDOSCOPY PROCEDURE REPORT  PATIENT: Joyce, Solomon  MR#: [id number] BIRTHDATE: 1986-02-22 , 29  yrs. old GENDER: female ENDOSCOPIST: Harl Bowie, MD REFERRED [place]:[facility] PROCEDURE DATE:  12/17/2015 PROCEDURE:   Sigmoidoscopy with biopsy INDICATIONS:follow up for previously diagnosed irritable bowel disease, follow up for previously diagnosed colitis, and previously diagnosed ulcerative colitis. MEDICATIONS: Propofol 400 mg IV  DESCRIPTION OF PROCEDURE:    Physical exam was performed.  Informed consent was obtained from the patient after explaining the benefits, risks, and alternatives to procedure.  The patient was connected to monitor and placed in left lateral position. Continuous oxygen was provided by nasal cannula and IV medicine administered through an indwelling cannula.  After administration of sedation and rectal exam, the patients rectum was intubated and the LB [id number]  colonoscope was advanced under direct visualization to the cecum.  The scope was removed slowly by carefully examining the color, texture, anatomy, and integrity mucosa on the way out.  The patient was recovered in endoscopy and discharged home in satisfactory condition. Estimated blood loss is zero unless otherwise noted in this procedure report.   COLON FINDINGS: Non-bleeding friable mucosal  continuous across the area examined with some ulcers, was present in the descending colon, at the splenic flexure, in the rectum, and sigmoid colon. Transverse colon appeared normal. Multiple biopsies were performed using cold forceps.  PREP QUALITY: The overall prep quality was adequate.  COMPLICATIONS: None  ENDOSCOPIC IMPRESSION: Non-bleeding mucosal ulceration in the descending colon, at the splenic flexure, in the rectum, and sigmoid colon; multiple biopsies were performed using cold  forceps  RECOMMENDATIONS: Await biopsy results Follow up in office visit Start Cimzia Continue Mesalamine   _______________________________ Lorrin MaisHarl Bowie, MD 12/17/2015 11:17 AM        PATIENT NAME:  Joyce, Solomon MR#: [id number]

## 2015-12-20 ENCOUNTER — Telehealth: Payer: Self-pay | Admitting: *Deleted

## 2015-12-20 ENCOUNTER — Telehealth: Payer: Self-pay

## 2015-12-20 NOTE — Telephone Encounter (Signed)
  Follow up Call-  Call back number 12/17/2015 04/24/2014  Post procedure Call Back phone  # [phone]  Permission to leave phone message Yes Yes     Patient questions:  Do you have a fever, pain , or abdominal swelling? No. Pain Score  0 *  Have you tolerated food without any problems? Yes.    Have you been able to return to your normal activities? Yes.    Do you have any questions about your discharge instructions: Diet   No. Medications  No. Follow up visit  No.  Do you have questions or concerns about your Care? No.  Actions: * If pain score is 4 or above: No action needed, pain <4.

## 2015-12-20 NOTE — Telephone Encounter (Signed)
-----   Message from Mauri Pole, MD sent at 12/20/2015  1:54 PM EST ----- Ok we can get her on Humira Thanks VN ----- Message -----    From: Greggory Keen, LPN    Sent: 01/05/2445   1:30 PM      To: Mauri Pole, MD  This patient has [place] Medicaid for her insurance. Humira is the preferred for both UC and Crohn's.  She has to have failed Humira and have Crohn's before I can get the Cimzia approved.

## 2015-12-20 NOTE — Telephone Encounter (Signed)
[employer] enrollment form completed. Patient is aware of the change from Cimzia to Humira due to her insurance. She has never given herself injections. She is interested in [employer] program and will come sign the form.

## 2015-12-22 ENCOUNTER — Other Ambulatory Visit: Payer: Self-pay

## 2015-12-22 MED ORDER — ADALIMUMAB 40 MG/0.8ML ~~LOC~~ AJKT: 1.0000 "pen " | AUTO-INJECTOR | SUBCUTANEOUS | Status: DC

## 2016-01-04 ENCOUNTER — Encounter: Payer: Self-pay | Admitting: Gastroenterology

## 2016-01-05 ENCOUNTER — Ambulatory Visit: Payer: Medicaid Other | Admitting: Gastroenterology

## 2016-01-14 ENCOUNTER — Telehealth: Payer: Self-pay | Admitting: Gastroenterology

## 2016-01-14 ENCOUNTER — Other Ambulatory Visit: Payer: Self-pay

## 2016-01-14 MED ORDER — HUMIRA PEN 40 MG/0.8ML ~~LOC~~ PNKT: 1.0000 "pen " | PEN_INJECTOR | SUBCUTANEOUS | Status: DC

## 2016-01-14 NOTE — Telephone Encounter (Signed)
Spoke with Joyce Solomon. She called [employer] yesterday about the insurance issues. She was told by them to send the Rx through [facility]. Forms completed using the NPI of Alonza [place]us, PA-C because [place] Medicaid still does not have Dr Silverio Decamp registered as a provider and do not recognize her NPI.

## 2016-01-27 ENCOUNTER — Telehealth: Payer: Self-pay | Admitting: Gastroenterology

## 2016-01-27 NOTE — Telephone Encounter (Signed)
Authorization given to re-issue a starter pack of the Humira

## 2016-02-03 ENCOUNTER — Emergency Department (HOSPITAL_COMMUNITY)
Admission: EM | Admit: 2016-02-03 | Discharge: 2016-02-04 | Disposition: A | Discharge status: Home / Self Care | Payer: Medicaid Other | Source: Home / Self Care

## 2016-02-03 DIAGNOSIS — Z8719 Personal history of other diseases of the digestive system: Secondary | ICD-10-CM | POA: Diagnosis not present

## 2016-02-03 DIAGNOSIS — R1084 Generalized abdominal pain: Secondary | ICD-10-CM | POA: Insufficient documentation

## 2016-02-03 DIAGNOSIS — Z862 Personal history of diseases of the blood and blood-forming organs and certain disorders involving the immune mechanism: Secondary | ICD-10-CM | POA: Diagnosis not present

## 2016-02-03 DIAGNOSIS — Z87891 Personal history of nicotine dependence: Secondary | ICD-10-CM | POA: Diagnosis not present

## 2016-02-03 DIAGNOSIS — N946 Dysmenorrhea, unspecified: Secondary | ICD-10-CM | POA: Diagnosis not present

## 2016-02-03 DIAGNOSIS — N920 Excessive and frequent menstruation with regular cycle: Secondary | ICD-10-CM | POA: Diagnosis not present

## 2016-02-03 DIAGNOSIS — Z91040 Latex allergy status: Secondary | ICD-10-CM | POA: Diagnosis not present

## 2016-02-03 DIAGNOSIS — R1030 Lower abdominal pain, unspecified: Secondary | ICD-10-CM | POA: Diagnosis present

## 2016-02-03 DIAGNOSIS — Z9114 Patient's other noncompliance with medication regimen: Secondary | ICD-10-CM | POA: Diagnosis not present

## 2016-02-04 ENCOUNTER — Encounter (HOSPITAL_COMMUNITY): Payer: Self-pay | Admitting: Emergency Medicine

## 2016-02-04 ENCOUNTER — Emergency Department (HOSPITAL_COMMUNITY)
Admission: EM | Admit: 2016-02-04 | Discharge: 2016-02-04 | Disposition: A | Discharge status: Home / Self Care | Payer: Medicaid Other | Attending: Emergency Medicine | Admitting: Emergency Medicine

## 2016-02-04 DIAGNOSIS — Z91040 Latex allergy status: Secondary | ICD-10-CM | POA: Insufficient documentation

## 2016-02-04 DIAGNOSIS — Z87891 Personal history of nicotine dependence: Secondary | ICD-10-CM | POA: Insufficient documentation

## 2016-02-04 DIAGNOSIS — Z8719 Personal history of other diseases of the digestive system: Secondary | ICD-10-CM | POA: Insufficient documentation

## 2016-02-04 DIAGNOSIS — N946 Dysmenorrhea, unspecified: Secondary | ICD-10-CM | POA: Insufficient documentation

## 2016-02-04 DIAGNOSIS — Z9114 Patient's other noncompliance with medication regimen: Secondary | ICD-10-CM | POA: Insufficient documentation

## 2016-02-04 DIAGNOSIS — N920 Excessive and frequent menstruation with regular cycle: Secondary | ICD-10-CM | POA: Insufficient documentation

## 2016-02-04 DIAGNOSIS — Z862 Personal history of diseases of the blood and blood-forming organs and certain disorders involving the immune mechanism: Secondary | ICD-10-CM | POA: Insufficient documentation

## 2016-02-04 LAB — I-STAT BETA HCG BLOOD, ED (MC, WL, AP ONLY)

## 2016-02-04 LAB — BASIC METABOLIC PANEL
ANION GAP: 10 (ref 5–15)
BUN: 5 mg/dL — ABNORMAL LOW (ref 6–20)
CALCIUM: 8.9 mg/dL (ref 8.9–10.3)
CO2: 23 mmol/L (ref 22–32)
Chloride: 108 mmol/L (ref 101–111)
Creatinine, Ser: 0.54 mg/dL (ref 0.44–1.00)
Glucose, Bld: 83 mg/dL (ref 65–99)
Potassium: 3.5 mmol/L (ref 3.5–5.1)
SODIUM: 141 mmol/L (ref 135–145)

## 2016-02-04 LAB — CBC WITH DIFFERENTIAL/PLATELET
BASOS ABS: 0 10*3/uL (ref 0.0–0.1)
BASOS PCT: 0 %
EOS ABS: 0.5 10*3/uL (ref 0.0–0.7)
Eosinophils Relative: 4 %
HCT: 36.9 % (ref 36.0–46.0)
HEMOGLOBIN: 12.6 g/dL (ref 12.0–15.0)
LYMPHS ABS: 4.1 10*3/uL — AB (ref 0.7–4.0)
Lymphocytes Relative: 31 %
MCH: 29.7 pg (ref 26.0–34.0)
MCHC: 34.1 g/dL (ref 30.0–36.0)
MCV: 87 fL (ref 78.0–100.0)
Monocytes Absolute: 1.1 10*3/uL — ABNORMAL HIGH (ref 0.1–1.0)
Monocytes Relative: 9 %
NEUTROS PCT: 57 %
Neutro Abs: 7.5 10*3/uL (ref 1.7–7.7)
Platelets: 510 10*3/uL — ABNORMAL HIGH (ref 150–400)
RBC: 4.24 MIL/uL (ref 3.87–5.11)
RDW: 17.5 % — ABNORMAL HIGH (ref 11.5–15.5)
WBC: 13.2 10*3/uL — AB (ref 4.0–10.5)

## 2016-02-04 LAB — WET PREP, GENITAL
CLUE CELLS WET PREP: NONE SEEN
Sperm: NONE SEEN
TRICH WET PREP: NONE SEEN
Yeast Wet Prep HPF POC: NONE SEEN

## 2016-02-04 LAB — COMPREHENSIVE METABOLIC PANEL
ALBUMIN: 3.8 g/dL (ref 3.5–5.0)
ALK PHOS: 47 U/L (ref 38–126)
ALT: 10 U/L — AB (ref 14–54)
ANION GAP: 8 (ref 5–15)
AST: 12 U/L — AB (ref 15–41)
BILIRUBIN TOTAL: 0.3 mg/dL (ref 0.3–1.2)
BUN: 7 mg/dL (ref 6–20)
CALCIUM: 8.6 mg/dL — AB (ref 8.9–10.3)
CO2: 21 mmol/L — ABNORMAL LOW (ref 22–32)
Chloride: 107 mmol/L (ref 101–111)
Creatinine, Ser: 0.56 mg/dL (ref 0.44–1.00)
GFR calc Af Amer: >60 mL/min (ref >60)
GFR calc non Af Amer: >60 mL/min (ref >60)
GLUCOSE: 142 mg/dL — AB (ref 65–99)
Potassium: 3.2 mmol/L — ABNORMAL LOW (ref 3.5–5.1)
Sodium: 136 mmol/L (ref 135–145)
TOTAL PROTEIN: 6.9 g/dL (ref 6.5–8.1)

## 2016-02-04 LAB — CBC
HCT: 34.5 % — ABNORMAL LOW (ref 36.0–46.0)
HEMOGLOBIN: 11.3 g/dL — AB (ref 12.0–15.0)
MCH: 29.6 pg (ref 26.0–34.0)
MCHC: 32.8 g/dL (ref 30.0–36.0)
MCV: 90.3 fL (ref 78.0–100.0)
Platelets: 439 10*3/uL — ABNORMAL HIGH (ref 150–400)
RBC: 3.82 MIL/uL — ABNORMAL LOW (ref 3.87–5.11)
RDW: 17.6 % — AB (ref 11.5–15.5)
WBC: 10.6 10*3/uL — ABNORMAL HIGH (ref 4.0–10.5)

## 2016-02-04 LAB — LIPASE, BLOOD: LIPASE: 37 U/L (ref 11–51)

## 2016-02-04 MED ORDER — ACETAMINOPHEN 325 MG PO TABS
650.0000 mg | ORAL_TABLET | Freq: Once | ORAL | Status: AC
  Administered 2016-02-04: 650 mg via ORAL
  Filled 2016-02-04: qty 2

## 2016-02-04 MED ORDER — KETOROLAC TROMETHAMINE 30 MG/ML IJ SOLN
30.0000 mg | Freq: Once | INTRAMUSCULAR | Status: AC
  Administered 2016-02-04: 30 mg via INTRAMUSCULAR
  Filled 2016-02-04: qty 1

## 2016-02-04 MED ORDER — IBUPROFEN 800 MG PO TABS: 800.0000 mg | ORAL_TABLET | Freq: Three times a day (TID) | ORAL | Status: DC

## 2016-02-04 NOTE — ED Provider Notes (Signed)
CSN: [id number]     Arrival date & time 02/04/16  1107 History   First MD Initiated Contact with Patient 02/04/16 1252     Chief Complaint  Patient presents with  . Menstrual Problem  . Menorrhagia    HPI   Joyce Solomon is an 30 y.o. female with history of IDA, ulcerative colitis who presents to the ED for evaluation of heavy menstrual bleeding and abdominal cramps. She states her bleeding and cramps started yesterday. She states she thinks she is a few days early for her period. She reports 10/10 severe low abdominal cramping that feels like her typical menstrual cramps though worse in severity. She states that since yesterday she has also had heavy vaginal bleeding, requiring 2 tampons an hour. Denies n/v/d. Denies fever or chills. Denies feeling faint, lightheaded, or dizzy. She has tried tylenol without relief. Denies dysuria, urinary frequency/urgency. She states typically her periods are regular but not heavy. However, does note she did have a few episodes of heavy menstruation years ago. Denies current sexual activity. Denies vaginal discharge that is different than usual prior to onset of menses.   Past Medical History  Diagnosis Date  . Anemia 2008    Iron deficiency.  s/p transfusions 2009, 2010. Inconsistent compliance with po Iron.   . Chronic ulcerative colitis ([place]) 2007  . Headache(784.0)   . Allergy     SEASONAL  . Non compliance w medication regimen 06/2015    stopped Remicade, may not have been taking Azulfidine.   Past Surgical History  Procedure Laterality Date  . Tonsillectomy    . Dilation and evacuation  08/21/2012    Procedure: DILATATION AND EVACUATION;  Surgeon: Logan Bores, MD;  Location: [facility];  Service: Gynecology;  Laterality: N/A;  . Cesarean section N/A 12/09/2013    Procedure: Primary Cesarean Section Delivery Baby Girl @ 1959, Apgars 8/8;  Surgeon: Logan Bores, MD;  Location: [facility];  Service: Obstetrics;  Laterality: N/A;  . Colonoscopy   01/19/2011    Dr Penelope Coop. no active UC  . Colonoscopy w/ biopsies  04/2014    Dr Deatra Ina. Left sided colitis from prox descending to rectum. iNo inflammation at and proximal to splenic flexure.    Family History  Problem Relation Age of Onset  . Hypotension Neg Hx   . Alcohol abuse Neg Hx   . Arthritis Neg Hx   . Asthma Neg Hx   . Birth defects Neg Hx   . COPD Neg Hx   . Depression Neg Hx   . Diabetes Neg Hx   . Drug abuse Neg Hx   . Hearing loss Neg Hx   . Heart disease Neg Hx   . Hyperlipidemia Neg Hx   . Hypertension Neg Hx   . Kidney disease Neg Hx   . Learning disabilities Neg Hx   . Mental illness Neg Hx   . Mental retardation Neg Hx   . Colon cancer Neg Hx   . Colon polyps Neg Hx   . Esophageal cancer Neg Hx   . Stomach cancer Neg Hx   . Rectal cancer Neg Hx   . Miscarriages / [place]     Social History  Substance Use Topics  . Smoking status: Former Smoker -- 3 years    Types: Cigarettes    Quit date: 05/16/2010  . Smokeless tobacco: Never Used  . Alcohol Use: No   OB History    Gravida Para Term Preterm AB TAB SAB Ectopic Multiple  Living   4 2 2  1  1   2      Review of Systems  All other systems reviewed and are negative.     Allergies  Latex and Remicade  Home Medications   Prior to Admission medications   Medication Sig Start Date End Date Taking? Authorizing Provider  acetaminophen (TYLENOL) 500 MG tablet Take 1,000 mg by mouth every 6 (six) hours as needed for mild pain, moderate pain or headache.   Yes Historical Provider, MD  HUMIRA PEN 40 MG/0.8ML PNKT Inject 1 pen into the skin every 14 (fourteen) days. Begin after induction of 160 mg SQ on day 1, then 80 mg SQ on day 15, then 40 mg every other week (14 days) 01/14/16  Yes Laban Emperor Zehr, PA-C  dicyclomine (BENTYL) 10 MG capsule Take 1 capsule (10 mg total) by mouth every 6 (six) hours as needed for spasms. Patient not taking: Reported on 12/07/2015 10/21/15   Amy S Esterwood, PA-C   mesalamine (LIALDA) 1.2 G EC tablet TAKE FOUR TABLETS BY MOUTH DAILY WITH BREAKFAST Patient not taking: Reported on 02/04/2016 10/21/15   Amy S Esterwood, PA-C  ondansetron (ZOFRAN) 4 MG tablet Take 1 tab every 6 hours as needed for nausea. Patient not taking: Reported on 12/07/2015 10/21/15   Amy S Esterwood, PA-C   BP 110/68 mmHg  Pulse 69  Temp(Src) 98.8 F (37.1 C) (Oral)  Resp 18  SpO2 100%  LMP 02/03/2016 (Exact Date) Physical Exam  Constitutional: She is oriented to person, place, and time.  HENT:  Right Ear: External ear normal.  Left Ear: External ear normal.  Nose: Nose normal.  Mouth/Throat: Oropharynx is clear and moist. No oropharyngeal exudate.  Eyes: Conjunctivae and EOM are normal. Pupils are equal, round, and reactive to light.  Neck: Normal range of motion. Neck supple.  Cardiovascular: Normal rate, regular rhythm, normal heart sounds and intact distal pulses.   Pulmonary/Chest: Effort normal and breath sounds normal. No respiratory distress. She has no wheezes. She exhibits no tenderness.  Abdominal: Soft. Bowel sounds are normal. She exhibits no distension. There is no tenderness. There is no rebound and no guarding.  Genitourinary:  There is scant blood in vault. There does appear to be some blood collected near cervix, though cervical os is closed. Vaginal canal nontender. No masses. No cervical motion tenderness or adnexal tenderness.  Musculoskeletal: She exhibits no edema.  Neurological: She is alert and oriented to person, place, and time. No cranial nerve deficit.  Skin: Skin is warm and dry. No pallor.  Psychiatric: She has a normal mood and affect.  Nursing note and vitals reviewed.  Filed Vitals:   02/04/16 1114 02/04/16 1313 02/04/16 1505  BP: 124/81 110/68 133/94  Pulse: 83 69 73  Temp: 98.2 F (36.8 C) 98.8 F (37.1 C) 98.8 F (37.1 C)  TempSrc: Oral Oral Oral  Resp: 16 18 18   SpO2: 100% 100% 100%     ED Course  Procedures (including  critical care time) Labs Review Labs Reviewed  WET PREP, GENITAL - Abnormal; Notable for the following:    WBC, Wet Prep HPF POC MODERATE (*)    All other components within normal limits  CBC WITH DIFFERENTIAL/PLATELET - Abnormal; Notable for the following:    WBC 13.2 (*)    RDW 17.5 (*)    Platelets 510 (*)    Lymphs Abs 4.1 (*)    Monocytes Absolute 1.1 (*)    All other components within normal  limits  BASIC METABOLIC PANEL - Abnormal; Notable for the following:    BUN 5 (*)    All other components within normal limits  I-STAT BETA HCG BLOOD, ED ([facility], [facility], [facility] ONLY)  GC/CHLAMYDIA PROBE AMP ([place]) NOT AT [facility]    Imaging Review No results found. I have personally reviewed and evaluated these images and lab results as part of my medical decision-making.   EKG Interpretation None      MDM   Final diagnoses:  Dysmenorrhea  Menorrhagia with regular cycle    Labs unremarkable. No anemia. Pt is nontoxic appearing. No tachycardia, hypotension. Likely dysmenorrhea and heavy/abnormal uterine bleeding. Instructed pt to f/u with her OBGYN, though also gave info for women's clinic. NSAIDs as needed for abdominal cramps. ER return precautions given.     Anne Ng, PA-C 02/04/16 [place], MD 02/07/16 [phone]

## 2016-02-04 NOTE — ED Notes (Signed)
ED PA at bedside

## 2016-02-04 NOTE — Discharge Instructions (Signed)
Please follow up with your OBGYN within one week. In the meantime you may take ibuprofen and/or tylenol as needed for cramps. Return to the ER for new or worsening symptoms.   Abnormal Uterine Bleeding Abnormal uterine bleeding can affect women at various stages in life, including teenagers, women in their reproductive years, pregnant women, and women who have reached menopause. Several kinds of uterine bleeding are considered abnormal, including:  Bleeding or spotting between periods.   Bleeding after sexual intercourse.   Bleeding that is heavier or more than normal.   Periods that last longer than usual.  Bleeding after menopause.  Many cases of abnormal uterine bleeding are minor and simple to treat, while others are more serious. Any type of abnormal bleeding should be evaluated by your health care provider. Treatment will depend on the cause of the bleeding. HOME CARE INSTRUCTIONS Monitor your condition for any changes. The following actions may help to alleviate any discomfort you are experiencing:  Avoid the use of tampons and douches as directed by your health care provider.  Change your pads frequently. You should get regular pelvic exams and Pap tests. Keep all follow-up appointments for diagnostic tests as directed by your health care provider.  SEEK MEDICAL CARE IF:   Your bleeding lasts more than 1 week.   You feel dizzy at times.  SEEK IMMEDIATE MEDICAL CARE IF:   You pass out.   You are changing pads every 15 to 30 minutes.   You have abdominal pain.  You have a fever.   You become sweaty or weak.   You are passing large blood clots from the vagina.   You start to feel nauseous and vomit. MAKE SURE YOU:   Understand these instructions.  Will watch your condition.  Will get help right away if you are not doing well or get worse.   This information is not intended to replace advice given to you by your health care provider. Make sure you  discuss any questions you have with your health care provider.   Document Released: 11/06/2005 Document Revised: 11/11/2013 Document Reviewed: 06/05/2013 Elsevier Interactive Patient Education 2016 Elsevier Inc.  Dysmenorrhea Menstrual cramps (dysmenorrhea) are caused by the muscles of the uterus tightening (contracting) during a menstrual period. For some women, this discomfort is merely bothersome. For others, dysmenorrhea can be severe enough to interfere with everyday activities for a few days each month. Primary dysmenorrhea is menstrual cramps that last a couple of days when you start having menstrual periods or soon after. This often begins after a teenager starts having her period. As a woman gets older or has a baby, the cramps will usually lessen or disappear. Secondary dysmenorrhea begins later in life, lasts longer, and the pain may be stronger than primary dysmenorrhea. The pain may start before the period and last a few days after the period.  CAUSES  Dysmenorrhea is usually caused by an underlying problem, such as:  The tissue lining the uterus grows outside of the uterus in other areas of the body (endometriosis).  The endometrial tissue, which normally lines the uterus, is found in or grows into the muscular walls of the uterus (adenomyosis).  The pelvic blood vessels are engorged with blood just before the menstrual period (pelvic congestive syndrome).  Overgrowth of cells (polyps) in the lining of the uterus or cervix.  Falling down of the uterus (prolapse) because of loose or stretched ligaments.  Depression.  Bladder problems, infection, or inflammation.  Problems with the intestine,  a tumor, or irritable bowel syndrome.  Cancer of the female organs or bladder.  A severely tipped uterus.  A very tight opening or closed cervix.  Noncancerous tumors of the uterus (fibroids).  Pelvic inflammatory disease (PID).  Pelvic scarring (adhesions) from a previous  surgery.  Ovarian cyst.  An intrauterine device (IUD) used for birth control. RISK FACTORS You may be at greater risk of dysmenorrhea if:  You are younger than age 73.  You started puberty early.  You have irregular or heavy bleeding.  You have never given birth.  You have a family history of this problem.  You are a smoker. SIGNS AND SYMPTOMS   Cramping or throbbing pain in your lower abdomen.  Headaches.  Lower back pain.  Nausea or vomiting.  Diarrhea.  Sweating or dizziness.  Loose stools. DIAGNOSIS  A diagnosis is based on your history, symptoms, physical exam, diagnostic tests, or procedures. Diagnostic tests or procedures may include:  Blood tests.  Ultrasonography.  An examination of the lining of the uterus (dilation and curettage, D&C).  An examination inside your abdomen or pelvis with a scope (laparoscopy).  X-rays.  CT scan.  MRI.  An examination inside the bladder with a scope (cystoscopy).  An examination inside the intestine or stomach with a scope (colonoscopy, gastroscopy). TREATMENT  Treatment depends on the cause of the dysmenorrhea. Treatment may include:  Pain medicine prescribed by your health care provider.  Birth control pills or an IUD with progesterone hormone in it.  Hormone replacement therapy.  Nonsteroidal anti-inflammatory drugs (NSAIDs). These may help stop the production of prostaglandins.  Surgery to remove adhesions, endometriosis, ovarian cyst, or fibroids.  Removal of the uterus (hysterectomy).  Progesterone shots to stop the menstrual period.  Cutting the nerves on the sacrum that go to the female organs (presacral neurectomy).  Electric current to the sacral nerves (sacral nerve stimulation).  Antidepressant medicine.  Psychiatric therapy, counseling, or group therapy.  Exercise and physical therapy.  Meditation and yoga therapy.  Acupuncture. HOME CARE INSTRUCTIONS   Only take  over-the-counter or prescription medicines as directed by your health care provider.  Place a heating pad or hot water bottle on your lower back or abdomen. Do not sleep with the heating pad.  Use aerobic exercises, walking, swimming, biking, and other exercises to help lessen the cramping.  Massage to the lower back or abdomen may help.  Stop smoking.  Avoid alcohol and caffeine. SEEK MEDICAL CARE IF:   Your pain does not get better with medicine.  You have pain with sexual intercourse.  Your pain increases and is not controlled with medicines.  You have abnormal vaginal bleeding with your period.  You develop nausea or vomiting with your period that is not controlled with medicine. SEEK IMMEDIATE MEDICAL CARE IF:  You pass out.    This information is not intended to replace advice given to you by your health care provider. Make sure you discuss any questions you have with your health care provider.   Document Released: 11/06/2005 Document Revised: 07/09/2013 Document Reviewed: 04/24/2013 Elsevier Interactive Patient Education [employer].

## 2016-02-04 NOTE — ED Notes (Signed)
Patient presents for generalized abdominal cramping, reports menstrual cycle started yesterday. Denies clots, report 2-3 tampons every hour.

## 2016-02-04 NOTE — ED Notes (Signed)
Pt states medication has been given by [place] RN. EDPA made aware

## 2016-02-04 NOTE — ED Notes (Signed)
Pt from home reports heavy menstrual bleeding with cramps. Pt reports that onset was yesterday and heavier than normal. Per pt, she is going through 2 pads/hr. Pt sts that she took tylenol last night w/o relief. Pt is A&O and in NAD

## 2016-02-07 LAB — GC/CHLAMYDIA PROBE AMP (~~LOC~~) NOT AT ARMC
Chlamydia: NEGATIVE
Neisseria Gonorrhea: NEGATIVE

## 2016-02-22 ENCOUNTER — Ambulatory Visit: Payer: Medicaid Other | Admitting: Gastroenterology

## 2016-03-27 ENCOUNTER — Other Ambulatory Visit: Payer: Self-pay | Admitting: Gastroenterology

## 2016-04-12 ENCOUNTER — Telehealth: Payer: Self-pay | Admitting: Gastroenterology

## 2016-04-12 NOTE — Telephone Encounter (Signed)
Patient injected the Humira in the abdomen this time. She does rotate her injection sites. She has a grape sized knot beneath the skin near the injection site. This was not there before the injection, but appeared within 24 hours after the injection. It is not draining and does not seem to be through the skin per patient. Her complaint is that it itches and she would like to scratch at. She states she has sensitive skin and this happened once before but was self-resolving. Should she just observe?

## 2016-04-12 NOTE — Telephone Encounter (Signed)
Can you bring her in office visit with APP to check. Thanks

## 2016-04-18 NOTE — Telephone Encounter (Signed)
Late entry from 04/13/16- Contacted the patient. She says the knot resolved itself and is no longer there. She also states this is the same thing that happened at her last injection.

## 2016-04-24 ENCOUNTER — Encounter: Payer: Self-pay | Admitting: Gastroenterology

## 2016-04-24 ENCOUNTER — Ambulatory Visit (INDEPENDENT_AMBULATORY_CARE_PROVIDER_SITE_OTHER): Payer: Medicaid Other | Admitting: Gastroenterology

## 2016-04-24 ENCOUNTER — Other Ambulatory Visit (INDEPENDENT_AMBULATORY_CARE_PROVIDER_SITE_OTHER): Payer: Medicaid Other

## 2016-04-24 VITALS — BP 100/60 | HR 66 | Ht 59.0 in | Wt 128.0 lb

## 2016-04-24 DIAGNOSIS — K648 Other hemorrhoids: Secondary | ICD-10-CM

## 2016-04-24 DIAGNOSIS — K5909 Other constipation: Secondary | ICD-10-CM

## 2016-04-24 DIAGNOSIS — K5100 Ulcerative (chronic) pancolitis without complications: Secondary | ICD-10-CM

## 2016-04-24 LAB — IBC PANEL
Iron: 17 ug/dL — ABNORMAL LOW (ref 42–145)
SATURATION RATIOS: 3.6 % — AB (ref 20.0–50.0)
TRANSFERRIN: 339 mg/dL (ref 212.0–360.0)

## 2016-04-24 LAB — COMPREHENSIVE METABOLIC PANEL
ALT: 6 U/L (ref 0–35)
AST: 11 U/L (ref 0–37)
Albumin: 4.2 g/dL (ref 3.5–5.2)
Alkaline Phosphatase: 63 U/L (ref 39–117)
BILIRUBIN TOTAL: 0.2 mg/dL (ref 0.2–1.2)
BUN: 13 mg/dL (ref 6–23)
CALCIUM: 9.1 mg/dL (ref 8.4–10.5)
CHLORIDE: 105 meq/L (ref 96–112)
CO2: 27 meq/L (ref 19–32)
Creatinine, Ser: 0.7 mg/dL (ref 0.40–1.20)
GFR: 125.99 mL/min (ref >60.00)
Glucose, Bld: 86 mg/dL (ref 70–99)
POTASSIUM: 4 meq/L (ref 3.5–5.1)
Sodium: 137 mEq/L (ref 135–145)
Total Protein: 7.4 g/dL (ref 6.0–8.3)

## 2016-04-24 LAB — CBC WITH DIFFERENTIAL/PLATELET
BASOS PCT: 0.5 % (ref 0.0–3.0)
Basophils Absolute: 0.1 10*3/uL (ref 0.0–0.1)
EOS PCT: 4.2 % (ref 0.0–5.0)
Eosinophils Absolute: 0.6 10*3/uL (ref 0.0–0.7)
HEMATOCRIT: 36.7 % (ref 36.0–46.0)
Hemoglobin: 12.2 g/dL (ref 12.0–15.0)
LYMPHS ABS: 6.2 10*3/uL — AB (ref 0.7–4.0)
LYMPHS PCT: 47.1 % — AB (ref 12.0–46.0)
MCHC: 33.2 g/dL (ref 30.0–36.0)
MCV: 89.6 fl (ref 78.0–100.0)
MONOS PCT: 9.2 % (ref 3.0–12.0)
Monocytes Absolute: 1.2 10*3/uL — ABNORMAL HIGH (ref 0.1–1.0)
NEUTROS ABS: 5.1 10*3/uL (ref 1.4–7.7)
Neutrophils Relative %: 39 % — ABNORMAL LOW (ref 43.0–77.0)
PLATELETS: 364 10*3/uL (ref 150.0–400.0)
RBC: 4.1 Mil/uL (ref 3.87–5.11)
RDW: 17.1 % — AB (ref 11.5–15.5)
WBC: 13.1 10*3/uL — ABNORMAL HIGH (ref 4.0–10.5)

## 2016-04-24 LAB — VITAMIN B12: Vitamin B-12: 492 pg/mL (ref 211–911)

## 2016-04-24 LAB — FERRITIN: FERRITIN: 4 ng/mL — AB (ref 10.0–291.0)

## 2016-04-24 MED ORDER — HYDROCORTISONE 2.5 % RE CREA: 1.0000 "application " | TOPICAL_CREAM | Freq: Every day | RECTAL | Status: DC | PRN

## 2016-04-24 NOTE — Patient Instructions (Addendum)
  Your physician has requested that you go to the basement for lab work before leaving today  We have sent the following medications to your pharmacy for you to pick up at your convenience:  Anusol cream - daily for 7-10 days  Benefiber - 1 tablespoon three times daily

## 2016-04-24 NOTE — Progress Notes (Signed)
Joyce Solomon    [id number]    02/02/1986  Primary Care Physician:[facility]  Referring Physician: [facility] [address] [place], [place] [address]  Chief complaint: Ulcerative colitis  HPI: 51 yr F with h/o left sided ulcerative colitis here for follow up visit. She underwent colonoscopy in Jan 2017 showed severe colitis in Left colon with ulceration, biopsies showed moderately actively colitis in descending colon and mild colitis in transverse colon, sigmoid and rectum. Started on Humira in March 2017. She reports feeling better since starting Humira. No longer having diarrhea. She is currently having 1-2 bowel movements a day but reports passing pellets and is having intermittent blood per rectum. Denies any nausea, vomiting, abdominal pain, or melena.   GI HX: started on Remicade in2015 and when seen here in August 2016 she had stopped the Remicade for several months on her own accord and also had stopped taking Azulfidine. She was started on Lialda at that time and rescheduled for Remicade infusions. She has been going for the infusions over the past few months the last was done on September 07, 2015 however she developed diffuse pruritus with this infusion. Infusion had to be stopped , and decision was made not to pursue any further Remicade. She did have positive infliximab antibody the level of 1622 and a drug level of less than 0.4 at the time in October 2016 She was started on prednisone taper for 2 weeks starting at 20 mg daily when she last saw Nicoletta Ba on 10/21/2015, patient said she just took it for a week and she's been off it since last week. She couldn't find the pills so she stopped taking them. She is having 3-4 bowel movements per day decreased in frequency compared to when it started 2 weeks ago no mucus or blood in stool.  Outpatient Encounter Prescriptions as of 04/24/2016  Medication Sig  . acetaminophen (TYLENOL) 500 MG tablet Take  1,000 mg by mouth every 6 (six) hours as needed for mild pain, moderate pain or headache.  [name] HUMIRA PEN 40 MG/0.8ML PNKT INJECT 40MG SUBCUTANEOUSLY EVERY 14 DAYS. BEGIN AFTER INDUCTION OF (6PENS) AS DIRECTED  . ibuprofen (ADVIL,MOTRIN) 800 MG tablet Take 1 tablet (800 mg total) by mouth 3 (three) times daily.  . ondansetron (ZOFRAN) 4 MG tablet Take 1 tab every 6 hours as needed for nausea.  . [DISCONTINUED] dicyclomine (BENTYL) 10 MG capsule Take 1 capsule (10 mg total) by mouth every 6 (six) hours as needed for spasms. (Patient not taking: Reported on 12/07/2015)  . [DISCONTINUED] mesalamine (LIALDA) 1.2 G EC tablet TAKE FOUR TABLETS BY MOUTH DAILY WITH BREAKFAST (Patient not taking: Reported on 02/04/2016)   Facility-Administered Encounter Medications as of 04/24/2016  Medication  . acetaminophen (TYLENOL) tablet 650 mg    Allergies as of 04/24/2016 - Review Complete 04/24/2016  Allergen Reaction Noted  . Latex Rash and Other (See Comments) 05/20/2009  . Remicade [infliximab] Itching 08/07/2015    Past Medical History  Diagnosis Date  . Anemia 2008    Iron deficiency.  s/p transfusions 2009, 2010. Inconsistent compliance with po Iron.   . Chronic ulcerative colitis ([place]) 2007  . Headache(784.0)   . Allergy     SEASONAL  . Non compliance w medication regimen 06/2015    stopped Remicade, may not have been taking Azulfidine.    Past Surgical History  Procedure Laterality Date  . Tonsillectomy    . Dilation  and evacuation  08/21/2012    Procedure: DILATATION AND EVACUATION;  Surgeon: Logan Bores, MD;  Location: [facility];  Service: Gynecology;  Laterality: N/A;  . Cesarean section N/A 12/09/2013    Procedure: Primary Cesarean Section Delivery Baby Girl @ 1959, Apgars 8/8;  Surgeon: Logan Bores, MD;  Location: [facility];  Service: Obstetrics;  Laterality: N/A;  . Colonoscopy  01/19/2011    Dr Penelope Coop. no active UC  . Colonoscopy w/ biopsies  04/2014    Dr Deatra Ina. Left sided  colitis from prox descending to rectum. iNo inflammation at and proximal to splenic flexure.     Family History  Problem Relation Age of Onset  . Hypotension Neg Hx   . Alcohol abuse Neg Hx   . Arthritis Neg Hx   . Asthma Neg Hx   . Birth defects Neg Hx   . COPD Neg Hx   . Depression Neg Hx   . Diabetes Neg Hx   . Drug abuse Neg Hx   . Hearing loss Neg Hx   . Heart disease Neg Hx   . Hyperlipidemia Neg Hx   . Hypertension Neg Hx   . Kidney disease Neg Hx   . Learning disabilities Neg Hx   . Mental illness Neg Hx   . Mental retardation Neg Hx   . Colon cancer Neg Hx   . Colon polyps Neg Hx   . Esophageal cancer Neg Hx   . Stomach cancer Neg Hx   . Rectal cancer Neg Hx   . Miscarriages / [place]      Social History   Social History  . Marital Status: Single    Spouse Name: N/A  . Number of Children: 2  . Years of Education: N/A   Occupational History  . unemployed    Social History Main Topics  . Smoking status: Former Smoker -- 3 years    Types: Cigarettes    Quit date: 05/16/2010  . Smokeless tobacco: Never Used  . Alcohol Use: No  . Drug Use: No  . Sexual Activity: Yes    Birth Control/ Protection: None, Injection   Other Topics Concern  . Not on file   Social History Narrative      Review of systems: Review of Systems  Constitutional: Negative for fever and chills.  HENT: Negative.   Eyes: Negative for blurred vision.  Respiratory: Negative for cough, shortness of breath and wheezing.   Cardiovascular: Negative for chest pain and palpitations.  Gastrointestinal: as per HPI Genitourinary: Negative for dysuria, urgency, frequency and hematuria.  Musculoskeletal: Negative for myalgias, back pain and joint pain.  Skin: Negative for itching and rash.  Neurological: Negative for dizziness, tremors, focal weakness, seizures and loss of consciousness.  Endo/Heme/Allergies: Negative for environmental allergies.  Psychiatric/Behavioral: Negative for  depression, suicidal ideas and hallucinations.  All other systems reviewed and are negative.   Physical Exam: Filed Vitals:   04/24/16 1448  BP: 100/60  Pulse: 66   Gen:      No acute distress HEENT:  EOMI, sclera anicteric Neck:     No masses; no thyromegaly Lungs:    Clear to auscultation bilaterally; normal respiratory effort CV:         Regular rate and rhythm; no murmurs Abd:      + bowel sounds; soft, non-tender; no palpable masses, no distension Ext:    No edema; adequate peripheral perfusion Skin:      Warm and dry; no rash Neuro: alert and  oriented x 3 Psych: normal mood and affect  Data Reviewed:  Reviewed chart in epic   Assessment and Plan/Recommendations: 58 yr F with h/o ulcerative colitis initiated on Humira in March 2017 is here for follow up.  She reports having hard stool and associated intermittent bright red blood on wiping or top of the stool but not mixed in suggestive of possible hemorrhoidal bleed.  Advised patient to increase dietary fluid and fiber intake Start benefiber 1 tablespoon TID with meals Anusol cream per rectum daily for 7-10 days and after that use as needed Will check CBC, CMP, B12/folate and iron panel Return in 6 months or sooner if needed    K. Denzil Magnuson , MD [phone] Mon-Fri 8a-5p [phone] after 5p, weekends, holidays  CC: Pa, [facility]

## 2016-05-01 ENCOUNTER — Other Ambulatory Visit: Payer: Self-pay

## 2016-05-01 DIAGNOSIS — D500 Iron deficiency anemia secondary to blood loss (chronic): Secondary | ICD-10-CM

## 2016-05-01 MED ORDER — FERROUS FUMARATE 150 MG PO TABS: 1.0000 | ORAL_TABLET | Freq: Three times a day (TID) | ORAL | Status: DC

## 2016-05-04 ENCOUNTER — Telehealth: Payer: Self-pay | Admitting: Gastroenterology

## 2016-05-04 NOTE — Telephone Encounter (Signed)
Returned [employer], Per Dr Jillyn Hidden note patient was suppose to have 380m sent in.. 1548mwas sent to pharmacy  Changed rx to 32552m

## 2016-05-25 ENCOUNTER — Encounter (HOSPITAL_COMMUNITY): Payer: Self-pay | Admitting: Emergency Medicine

## 2016-05-25 ENCOUNTER — Emergency Department (HOSPITAL_COMMUNITY)
Admission: EM | Admit: 2016-05-25 | Discharge: 2016-05-25 | Disposition: A | Discharge status: Home / Self Care | Payer: Medicaid Other | Attending: Emergency Medicine | Admitting: Emergency Medicine

## 2016-05-25 DIAGNOSIS — L299 Pruritus, unspecified: Secondary | ICD-10-CM | POA: Diagnosis present

## 2016-05-25 DIAGNOSIS — B350 Tinea barbae and tinea capitis: Secondary | ICD-10-CM | POA: Insufficient documentation

## 2016-05-25 DIAGNOSIS — Z87891 Personal history of nicotine dependence: Secondary | ICD-10-CM | POA: Insufficient documentation

## 2016-05-25 MED ORDER — GRISEOFULVIN MICROSIZE 500 MG PO TABS: 1000.0000 mg | ORAL_TABLET | Freq: Every day | ORAL | Status: AC

## 2016-05-25 NOTE — ED Notes (Signed)
Pt c/o small sore on top of head. Pt noticed it yesterday when working her fingers through her hair. RN looked at head and noted small round bloody spot on top of head about 1cm to 2cm across. Pt c/o itching. Denies any other symptoms. A&Ox4 and ambulatory. Pt sts it hurts to press on but then was unable to show RN where the spot was. RN had to search general area for spot.

## 2016-05-25 NOTE — ED Provider Notes (Signed)
CSN: [id number]     Arrival date & time 05/25/16  1942 History   First MD Initiated Contact with Patient 05/25/16 2125     Chief Complaint  Patient presents with  . Sore    On head     (Consider location/radiation/quality/duration/timing/severity/associated sxs/prior Treatment) HPI Patient reports that she was scratching her head yesterday and noticed a small wound area. She reports areas somewhat itchy. She was not aware of this and has not had any other associated symptoms. No injuries. No products different from usual. No other areas of rash or skin anomaly. Past Medical History  Diagnosis Date  . Anemia 2008    Iron deficiency.  s/p transfusions 2009, 2010. Inconsistent compliance with po Iron.   . Chronic ulcerative colitis ([place]) 2007  . Headache(784.0)   . Allergy     SEASONAL  . Non compliance w medication regimen 06/2015    stopped Remicade, may not have been taking Azulfidine.   Past Surgical History  Procedure Laterality Date  . Tonsillectomy    . Dilation and evacuation  08/21/2012    Procedure: DILATATION AND EVACUATION;  Surgeon: Logan Bores, MD;  Location: [facility];  Service: Gynecology;  Laterality: N/A;  . Cesarean section N/A 12/09/2013    Procedure: Primary Cesarean Section Delivery Baby Girl @ 1959, Apgars 8/8;  Surgeon: Logan Bores, MD;  Location: [facility];  Service: Obstetrics;  Laterality: N/A;  . Colonoscopy  01/19/2011    Dr Penelope Coop. no active UC  . Colonoscopy w/ biopsies  04/2014    Dr Deatra Ina. Left sided colitis from prox descending to rectum. iNo inflammation at and proximal to splenic flexure.    Family History  Problem Relation Age of Onset  . Hypotension Neg Hx   . Alcohol abuse Neg Hx   . Arthritis Neg Hx   . Asthma Neg Hx   . Birth defects Neg Hx   . COPD Neg Hx   . Depression Neg Hx   . Diabetes Neg Hx   . Drug abuse Neg Hx   . Hearing loss Neg Hx   . Heart disease Neg Hx   . Hyperlipidemia Neg Hx   . Hypertension Neg Hx   .  Kidney disease Neg Hx   . Learning disabilities Neg Hx   . Mental illness Neg Hx   . Mental retardation Neg Hx   . Colon cancer Neg Hx   . Colon polyps Neg Hx   . Esophageal cancer Neg Hx   . Stomach cancer Neg Hx   . Rectal cancer Neg Hx   . Miscarriages / [place]     Social History  Substance Use Topics  . Smoking status: Former Smoker -- 3 years    Types: Cigarettes    Quit date: 05/16/2010  . Smokeless tobacco: Never Used  . Alcohol Use: No   OB History    Gravida Para Term Preterm AB TAB SAB Ectopic Multiple Living   4 2 2  1  1   2      Review of Systems Constitutional: No fevers no chills no malaise. GI: No nausea no vomiting.   Allergies  Latex and Remicade  Home Medications   Prior to Admission medications   Medication Sig Start Date End Date Taking? Authorizing Provider  acetaminophen (TYLENOL) 500 MG tablet Take 1,000 mg by mouth every 6 (six) hours as needed for mild pain, moderate pain or headache.    Historical Provider, MD  Ferrous Fumarate 150 MG TABS  Take 1 tablet (150 mg total) by mouth 3 (three) times daily with meals. 05/01/16   Mauri Pole, MD  griseofulvin (GRIFULVIN V) 500 MG tablet Take 2 tablets (1,000 mg total) by mouth daily. 05/25/16 07/09/16  Charlesetta Shanks, MD  HUMIRA PEN 40 MG/0.8ML PNKT INJECT 40MG SUBCUTANEOUSLY EVERY 14 DAYS. BEGIN AFTER INDUCTION OF (6PENS) AS DIRECTED 03/27/16   Mauri Pole, MD  hydrocortisone (ANUSOL-HC) 2.5 % rectal cream Place 1 application rectally daily as needed for hemorrhoids or itching. 04/24/16   Mauri Pole, MD  ibuprofen (ADVIL,MOTRIN) 800 MG tablet Take 1 tablet (800 mg total) by mouth 3 (three) times daily. 02/04/16   Olivia Canter Sam, PA-C  ondansetron (ZOFRAN) 4 MG tablet Take 1 tab every 6 hours as needed for nausea. 10/21/15   Amy S Esterwood, PA-C   BP 120/58 mmHg  Pulse 95  Temp(Src) 97.9 F (36.6 C) (Oral)  Resp 18  SpO2 100%  LMP 09/20/2015 Physical Exam  Constitutional: She is  oriented to person, place, and time.  Patient is alert and nontoxic. She is well in appearance.  HENT:  Patient's scalp has a plaque approximately 5 cm round that is white and scaling on the right, posterior parietal scalp. There are 2 small excoriations adjacent to this with no bleeding or purulent discharge.  Eyes: EOM are normal.  Pulmonary/Chest: Effort normal.  Neurological: She is alert and oriented to person, place, and time. Coordination normal.  Skin: Skin is warm and dry.    ED Course  Procedures (including critical care time) Labs Review Labs Reviewed - No data to display  Imaging Review No results found. I have personally reviewed and evaluated these images and lab results as part of my medical decision-making.   EKG Interpretation None      MDM   Final diagnoses:  Tinea capitis   Patient has a approximate 5 cm round, white\silvery scaling plaque in the scalp. Some pruritus associated without pain. This appears characteristic of tinea capitis. Patient does not have reported other body rash. At this time treatment will be initiated.    Charlesetta Shanks, MD 05/25/16 2217

## 2016-05-25 NOTE — Discharge Instructions (Signed)
Scalp Ringworm You and any close contacts or family members should use dandruff shampoo containing selenium. Scalp ringworm (tinea capitis) is a fungal infection of the skin on the scalp. This condition is easily spread from person to person (contagious). It can also be spread from animals to humans. HOME CARE  Give or apply over-the-counter and prescription medicines only as told by your child's doctor. This may include giving medicine for up to 6-8 weeks to kill the fungus.  Check your household members and your pets, if this applies, for ringworm. Do this often to make sure they do not get the condition.  Do not share:  Brushes.  Combs.  Barrettes.  Hats.  Towels.   Clean and disinfect all combs, brushes, and hats that your child wears or uses. Throw away any natural bristle brushes.  Do not give your child a short haircut or shave his or her head while he or she is being treated.  Do not let your child go back to school until the doctor says it is okay.  Keep all follow-up visits as told by your child's doctor. This is important. GET HELP IF:  Your child's rash gets worse.  Your child's rash spreads.  Your child's rash comes back after treatment is done.  Your child's rash does not get better with treatment.  Your child has a fever.  Your child's rash is painful and medicine does not help the pain.  Your child's rash becomes red, warm, tender, and swollen. GET HELP RIGHT AWAY IF:  Your child has yellowish-white fluid (pus) coming from the rash.  Your child who is younger than 3 months has a temperature of 100F (38C) or higher.   This information is not intended to replace advice given to you by your health care provider. Make sure you discuss any questions you have with your health care provider.   Document Released: 10/25/2009 Document Revised: 07/28/2015 Document Reviewed: 04/14/2015 Elsevier Interactive Patient Education [employer].

## 2016-06-02 ENCOUNTER — Emergency Department (HOSPITAL_COMMUNITY)
Admission: EM | Admit: 2016-06-02 | Discharge: 2016-06-02 | Disposition: A | Discharge status: Home / Self Care | Payer: Medicaid Other | Attending: Emergency Medicine | Admitting: Emergency Medicine

## 2016-06-02 ENCOUNTER — Encounter (HOSPITAL_COMMUNITY): Payer: Self-pay

## 2016-06-02 DIAGNOSIS — Z79899 Other long term (current) drug therapy: Secondary | ICD-10-CM | POA: Diagnosis not present

## 2016-06-02 DIAGNOSIS — L03213 Periorbital cellulitis: Secondary | ICD-10-CM | POA: Insufficient documentation

## 2016-06-02 DIAGNOSIS — Z87891 Personal history of nicotine dependence: Secondary | ICD-10-CM | POA: Diagnosis not present

## 2016-06-02 DIAGNOSIS — H00019 Hordeolum externum unspecified eye, unspecified eyelid: Secondary | ICD-10-CM | POA: Insufficient documentation

## 2016-06-02 DIAGNOSIS — H5711 Ocular pain, right eye: Secondary | ICD-10-CM | POA: Diagnosis present

## 2016-06-02 MED ORDER — TOBRAMYCIN 0.3 % OP SOLN
2.0000 [drp] | OPHTHALMIC | Status: DC
  Administered 2016-06-02: 2 [drp] via OPHTHALMIC
  Filled 2016-06-02: qty 5

## 2016-06-02 MED ORDER — CEPHALEXIN 500 MG PO CAPS: 500.0000 mg | ORAL_CAPSULE | Freq: Three times a day (TID) | ORAL | Status: DC

## 2016-06-02 MED ORDER — CEPHALEXIN 500 MG PO CAPS
500.0000 mg | ORAL_CAPSULE | Freq: Once | ORAL | Status: AC
  Administered 2016-06-02: 500 mg via ORAL
  Filled 2016-06-02: qty 1

## 2016-06-02 NOTE — Discharge Instructions (Signed)
Place drops, warm compresses for 10 minutes after. PCP follow up 7 days if not improving. Cardiovascular vision changes, worsening swelling, fever.  Stye A stye is a bump on your eyelid caused by a bacterial infection. A stye can form inside the eyelid (internal stye) or outside the eyelid (external stye). An internal stye may be caused by an infected oil-producing gland inside your eyelid. An external stye may be caused by an infection at the base of your eyelash (hair follicle). Styes are very common. Anyone can get them at any age. They usually occur in just one eye, but you may have more than one in either eye.  CAUSES  The infection is almost always caused by bacteria called Staphylococcus aureus. This is a common type of bacteria that lives on your skin. RISK FACTORS You may be at higher risk for a stye if you have had one before. You may also be at higher risk if you have:  Diabetes.  Long-term illness.  Long-term eye redness.  A skin condition called seborrhea.  High fat levels in your blood (lipids). SIGNS AND SYMPTOMS  Eyelid pain is the most common symptom of a stye. Internal styes are more painful than external styes. Other signs and symptoms may include:  Painful swelling of your eyelid.  A scratchy feeling in your eye.  Tearing and redness of your eye.  Pus draining from the stye. DIAGNOSIS  Your health care provider may be able to diagnose a stye just by examining your eye. The health care provider may also check to make sure:  You do not have a fever or other signs of a more serious infection.  The infection has not spread to other parts of your eye or areas around your eye. TREATMENT  Most styes will clear up in a few days without treatment. In some cases, you may need to use antibiotic drops or ointment to prevent infection. Your health care provider may have to drain the stye surgically if your stye is:  Large.  Causing a lot of pain.  Interfering with  your vision. This can be done using a thin blade or a needle.  HOME CARE INSTRUCTIONS   Take medicines only as directed by your health care provider.  Apply a clean, warm compress to your eye for 10 minutes, 4 times a day.  Do not wear contact lenses or eye makeup until your stye has healed.  Do not try to pop or drain the stye. SEEK MEDICAL CARE IF:  You have chills or a fever.  Your stye does not go away after several days.  Your stye affects your vision.  Your eyeball becomes swollen, red, or painful. MAKE SURE YOU:  Understand these instructions.  Will watch your condition.  Will get help right away if you are not doing well or get worse.   This information is not intended to replace advice given to you by your health care provider. Make sure you discuss any questions you have with your health care provider.   Document Released: 08/16/2005 Document Revised: 11/27/2014 Document Reviewed: 02/20/2014 Elsevier Interactive Patient Education [employer].

## 2016-06-02 NOTE — ED Provider Notes (Signed)
CSN: [id number]     Arrival date & time 06/02/16  0744 History   First MD Initiated Contact with Patient 06/02/16 0805     Chief Complaint  Patient presents with  . Eye Pain      HPI  Patient resists evaluation of "eye pain". Started with a "stye" on her right lid margin. Now she has soft tissue swelling of her right lid. She began feeling some discomfort around her left lid margin as well. No fevers. No vision changes. No blurring or doubling. No fevers or chills.  History visible diabetes. States her blood sugars have been controlled at home. Recently on griseofulvin for tinea capitis.  Past Medical History  Diagnosis Date  . Anemia 2008    Iron deficiency.  s/p transfusions 2009, 2010. Inconsistent compliance with po Iron.   . Chronic ulcerative colitis ([place]) 2007  . Headache(784.0)   . Allergy     SEASONAL  . Non compliance w medication regimen 06/2015    stopped Remicade, may not have been taking Azulfidine.   Past Surgical History  Procedure Laterality Date  . Tonsillectomy    . Dilation and evacuation  08/21/2012    Procedure: DILATATION AND EVACUATION;  Surgeon: Logan Bores, MD;  Location: [facility];  Service: Gynecology;  Laterality: N/A;  . Cesarean section N/A 12/09/2013    Procedure: Primary Cesarean Section Delivery Baby Girl @ 1959, Apgars 8/8;  Surgeon: Logan Bores, MD;  Location: [facility];  Service: Obstetrics;  Laterality: N/A;  . Colonoscopy  01/19/2011    Dr Penelope Coop. no active UC  . Colonoscopy w/ biopsies  04/2014    Dr Deatra Ina. Left sided colitis from prox descending to rectum. iNo inflammation at and proximal to splenic flexure.    Family History  Problem Relation Age of Onset  . Hypotension Neg Hx   . Alcohol abuse Neg Hx   . Arthritis Neg Hx   . Asthma Neg Hx   . Birth defects Neg Hx   . COPD Neg Hx   . Depression Neg Hx   . Diabetes Neg Hx   . Drug abuse Neg Hx   . Hearing loss Neg Hx   . Heart disease Neg Hx   . Hyperlipidemia Neg Hx   .  Hypertension Neg Hx   . Kidney disease Neg Hx   . Learning disabilities Neg Hx   . Mental illness Neg Hx   . Mental retardation Neg Hx   . Colon cancer Neg Hx   . Colon polyps Neg Hx   . Esophageal cancer Neg Hx   . Stomach cancer Neg Hx   . Rectal cancer Neg Hx   . Miscarriages / [place]     Social History  Substance Use Topics  . Smoking status: Former Smoker -- 3 years    Types: Cigarettes    Quit date: 05/16/2010  . Smokeless tobacco: Never Used  . Alcohol Use: No   OB History    Gravida Para Term Preterm AB TAB SAB Ectopic Multiple Living   4 2 2  1  1   2      Review of Systems  Constitutional: Negative for fever, chills, diaphoresis, appetite change and fatigue.  HENT: Negative for mouth sores, sore throat and trouble swallowing.   Eyes: Positive for pain, redness and itching. Negative for discharge and visual disturbance.  Respiratory: Negative for cough, chest tightness, shortness of breath and wheezing.   Cardiovascular: Negative for chest pain.  Gastrointestinal: Negative for  nausea, vomiting, abdominal pain, diarrhea and abdominal distention.  Endocrine: Negative for polydipsia, polyphagia and polyuria.  Genitourinary: Negative for dysuria, frequency and hematuria.  Musculoskeletal: Negative for gait problem.  Skin: Negative for color change, pallor and rash.  Neurological: Negative for dizziness, syncope, light-headedness and headaches.  Hematological: Does not bruise/bleed easily.  Psychiatric/Behavioral: Negative for behavioral problems and confusion.      Allergies  Latex and Remicade  Home Medications   Prior to Admission medications   Medication Sig Start Date End Date Taking? Authorizing Provider  acetaminophen (TYLENOL) 500 MG tablet Take 1,000 mg by mouth every 6 (six) hours as needed for mild pain, moderate pain or headache.    Historical Provider, MD  cephALEXin (KEFLEX) 500 MG capsule Take 1 capsule (500 mg total) by mouth 3 (three) times  daily. 06/02/16   Tanna Furry, MD  Ferrous Fumarate 150 MG TABS Take 1 tablet (150 mg total) by mouth 3 (three) times daily with meals. 05/01/16   Mauri Pole, MD  griseofulvin (GRIFULVIN V) 500 MG tablet Take 2 tablets (1,000 mg total) by mouth daily. 05/25/16 07/09/16  Charlesetta Shanks, MD  HUMIRA PEN 40 MG/0.8ML PNKT INJECT 40MG SUBCUTANEOUSLY EVERY 14 DAYS. BEGIN AFTER INDUCTION OF (6PENS) AS DIRECTED 03/27/16   Mauri Pole, MD  hydrocortisone (ANUSOL-HC) 2.5 % rectal cream Place 1 application rectally daily as needed for hemorrhoids or itching. 04/24/16   Mauri Pole, MD  ibuprofen (ADVIL,MOTRIN) 800 MG tablet Take 1 tablet (800 mg total) by mouth 3 (three) times daily. 02/04/16   Olivia Canter Sam, PA-C  ondansetron (ZOFRAN) 4 MG tablet Take 1 tab every 6 hours as needed for nausea. 10/21/15   Amy S Esterwood, PA-C   BP 95/41 mmHg  Pulse 81  Temp(Src) 97.9 F (36.6 C) (Oral)  Resp 16  SpO2 100%  LMP 05/01/2016 (Approximate) Physical Exam  Constitutional: She is oriented to person, place, and time. She appears well-developed and well-nourished. No distress.  HENT:  Head: Normocephalic.    Eyes: Conjunctivae are normal. Pupils are equal, round, and reactive to light. No scleral icterus.    Neck: Normal range of motion. Neck supple. No thyromegaly present.  Cardiovascular: Normal rate and regular rhythm.  Exam reveals no gallop and no friction rub.   No murmur heard. Pulmonary/Chest: Effort normal and breath sounds normal. No respiratory distress. She has no wheezes. She has no rales.  Abdominal: Soft. Bowel sounds are normal. She exhibits no distension. There is no tenderness. There is no rebound.  Musculoskeletal: Normal range of motion.  Neurological: She is alert and oriented to person, place, and time.  Skin: Skin is warm and dry. No rash noted.  Psychiatric: She has a normal mood and affect. Her behavior is normal.    ED Course  Procedures (including critical care  time) Labs Review Labs Reviewed - No data to display  Imaging Review No results found. I have personally reviewed and evaluated these images and lab results as part of my medical decision-making.   EKG Interpretation None      MDM   Final diagnoses:  Hordeolum, unspecified laterality  Periorbital cellulitis of right eye    Symptoms and findings consistent with hordeolum and probable secondary periorbital cellulitis. Exam does not show findings concerning for post septal or orbital cellulitis. She is not proptotic no eye pain no pain with movement and for range of motion and no diplopia. Plan Keflex, Garamycin drops warm compresses. Primary care ER if not improving.  Recheck with eye pain, vision changes, blurring or doubling.    Tanna Furry, MD 06/02/16 1041

## 2016-06-02 NOTE — ED Notes (Signed)
Bed: XL70 Expected date:  Expected time:  Means of arrival:  Comments:

## 2016-06-02 NOTE — ED Notes (Signed)
Pt here with eye pain.  ? Stye on eye.  Started a week ago in left eye and now in right.

## 2016-06-06 ENCOUNTER — Telehealth: Payer: Self-pay | Admitting: Gastroenterology

## 2016-06-07 ENCOUNTER — Other Ambulatory Visit: Payer: Self-pay | Admitting: Gastroenterology

## 2016-06-07 NOTE — Telephone Encounter (Signed)
Called the patient. Left a voicemail offering 07/04/16 at 1:30 with Dr Silverio Decamp. Requested she call me back.

## 2016-06-07 NOTE — Telephone Encounter (Signed)
Please schedule office visit to discuss alternative treatment

## 2016-06-07 NOTE — Telephone Encounter (Signed)
Is there any relationship between having the ringworm and taking biologics?

## 2016-06-07 NOTE — Telephone Encounter (Signed)
She is under treatment for this fungal infection. Seen in the ER. She helfd the Humira on her own. She was supposed to receive the injection on 06/06/16.  She did not order the Humira and will now be several days off her scheduled dosing. Joyce Solomon feels the Humira is not effective. She complains of continued constipation and blood with the bowel movement. She moves her bowels 3 to 4 times a day but has small amount of hard stool that she has to strain to move. Reviewed the advise and instruction given at the last visit. She does not feel treatment has been effective. Wants to take something besides Humira.

## 2016-06-07 NOTE — Telephone Encounter (Signed)
Was she advised by someone to hold Humira or patient decided on her own?  If she is getting treated for the tinea, please advise her to continue Humira

## 2016-07-04 ENCOUNTER — Ambulatory Visit: Payer: Medicaid Other | Admitting: Gastroenterology

## 2016-07-13 ENCOUNTER — Encounter: Payer: Self-pay | Admitting: Gastroenterology

## 2016-07-13 ENCOUNTER — Other Ambulatory Visit (INDEPENDENT_AMBULATORY_CARE_PROVIDER_SITE_OTHER): Payer: Medicaid Other

## 2016-07-13 ENCOUNTER — Ambulatory Visit (INDEPENDENT_AMBULATORY_CARE_PROVIDER_SITE_OTHER): Payer: Medicaid Other | Admitting: Gastroenterology

## 2016-07-13 VITALS — BP 110/60 | HR 74 | Ht 59.0 in | Wt 131.0 lb

## 2016-07-13 DIAGNOSIS — K5180 Other ulcerative colitis without complications: Secondary | ICD-10-CM

## 2016-07-13 DIAGNOSIS — R197 Diarrhea, unspecified: Secondary | ICD-10-CM

## 2016-07-13 DIAGNOSIS — K625 Hemorrhage of anus and rectum: Secondary | ICD-10-CM | POA: Diagnosis not present

## 2016-07-13 DIAGNOSIS — D500 Iron deficiency anemia secondary to blood loss (chronic): Secondary | ICD-10-CM

## 2016-07-13 LAB — CBC WITH DIFFERENTIAL/PLATELET
Basophils Absolute: 0 K/uL (ref 0.0–0.1)
Basophils Relative: 0.4 % (ref 0.0–3.0)
Eosinophils Absolute: 0.4 K/uL (ref 0.0–0.7)
Eosinophils Relative: 4.1 % (ref 0.0–5.0)
HCT: 37 % (ref 36.0–46.0)
Hemoglobin: 12.6 g/dL (ref 12.0–15.0)
Lymphocytes Relative: 31.7 % (ref 12.0–46.0)
Lymphs Abs: 3.4 K/uL (ref 0.7–4.0)
MCHC: 33.9 g/dL (ref 30.0–36.0)
MCV: 89.9 fl (ref 78.0–100.0)
Monocytes Absolute: 1.5 K/uL — ABNORMAL HIGH (ref 0.1–1.0)
Monocytes Relative: 14 % — ABNORMAL HIGH (ref 3.0–12.0)
Neutro Abs: 5.4 K/uL (ref 1.4–7.7)
Neutrophils Relative %: 49.8 % (ref 43.0–77.0)
Platelets: 290 K/uL (ref 150.0–400.0)
RBC: 4.11 Mil/uL (ref 3.87–5.11)
RDW: 15.5 % (ref 11.5–15.5)
WBC: 10.8 K/uL — ABNORMAL HIGH (ref 4.0–10.5)

## 2016-07-13 LAB — COMPREHENSIVE METABOLIC PANEL WITH GFR
ALT: 8 U/L (ref 0–35)
AST: 11 U/L (ref 0–37)
Albumin: 3.7 g/dL (ref 3.5–5.2)
Alkaline Phosphatase: 62 U/L (ref 39–117)
BUN: 12 mg/dL (ref 6–23)
CO2: 24 meq/L (ref 19–32)
Calcium: 8.5 mg/dL (ref 8.4–10.5)
Chloride: 106 meq/L (ref 96–112)
Creatinine, Ser: 0.58 mg/dL (ref 0.40–1.20)
GFR: 156.3 mL/min
Glucose, Bld: 120 mg/dL — ABNORMAL HIGH (ref 70–99)
Potassium: 4 meq/L (ref 3.5–5.1)
Sodium: 135 meq/L (ref 135–145)
Total Bilirubin: 0.2 mg/dL (ref 0.2–1.2)
Total Protein: 6.7 g/dL (ref 6.0–8.3)

## 2016-07-13 LAB — FERRITIN: Ferritin: 9.6 ng/mL — ABNORMAL LOW (ref 10.0–291.0)

## 2016-07-13 MED ORDER — MESALAMINE 1000 MG RE SUPP: 1000.0000 mg | Freq: Every day | RECTAL | 2 refills | Status: DC

## 2016-07-13 NOTE — Progress Notes (Signed)
Joyce Solomon    [id number]    07-22-1986  Primary Care Physician:[facility]  Referring Physician: [place] [address] [place], [place] [address]  Chief complaint: Ulcerative colitis, diarrhea, blood per rectum  HPI:  63 yr F with h/o left sided ulcerative colitis here for follow up visit. She underwent colonoscopy in Jan 2017 showed severe colitis in Left colon with ulceration, biopsies showed moderately actively colitis in descending colon and mild colitis in transverse colon, sigmoid and rectum. Started on Humira in March 2017. She continues to have increased bowel frequency with 4-5 bowel movements daily , has semi formed to liquid stool. She continues to have bright red blood per rectum and notices blood in the toilet bowl. What happened Denies any nausea, vomiting, abdominal pain, or melena. She is on Humira every 2 weeks. Denies excessive straining with bowel movements. Weight is stable.   Outpatient Encounter Prescriptions as of 07/13/2016  Medication Sig  . Ferrous Fumarate 150 MG TABS Take 1 tablet (150 mg total) by mouth 3 (three) times daily with meals.  [name] HUMIRA PEN 40 MG/0.8ML PNKT INJECT 40MG SUBCUTANEOUSLY EVERY 14 DAYS. BEGIN AFTER INDUCTION OF (6PENS) AS DIRECTED  . [DISCONTINUED] acetaminophen (TYLENOL) 500 MG tablet Take 1,000 mg by mouth every 6 (six) hours as needed for mild pain, moderate pain or headache.  . [DISCONTINUED] cephALEXin (KEFLEX) 500 MG capsule Take 1 capsule (500 mg total) by mouth 3 (three) times daily.  . [DISCONTINUED] hydrocortisone (ANUSOL-HC) 2.5 % rectal cream Place 1 application rectally daily as needed for hemorrhoids or itching.  . [DISCONTINUED] ibuprofen (ADVIL,MOTRIN) 800 MG tablet Take 1 tablet (800 mg total) by mouth 3 (three) times daily.  . [DISCONTINUED] ondansetron (ZOFRAN) 4 MG tablet Take 1 tab every 6 hours as needed for nausea.  . [DISCONTINUED] acetaminophen (TYLENOL) tablet 650 mg    No  facility-administered encounter medications on file as of 07/13/2016.     Allergies as of 07/13/2016 - Review Complete 07/13/2016  Allergen Reaction Noted  . Latex Rash and Other (See Comments) 05/20/2009  . Remicade [infliximab] Itching 08/07/2015    Past Medical History:  Diagnosis Date  . Allergy    SEASONAL  . Anemia 2008   Iron deficiency.  s/p transfusions 2009, 2010. Inconsistent compliance with po Iron.   . Chronic ulcerative colitis ([place]) 2007  . Headache(784.0)   . Non compliance w medication regimen 06/2015   stopped Remicade, may not have been taking Azulfidine.    Past Surgical History:  Procedure Laterality Date  . CESAREAN SECTION N/A 12/09/2013   Procedure: Primary Cesarean Section Delivery Baby Girl @ 1959, Apgars 8/8;  Surgeon: Logan Bores, MD;  Location: [facility];  Service: Obstetrics;  Laterality: N/A;  . COLONOSCOPY  01/19/2011   Dr Penelope Coop. no active UC  . COLONOSCOPY W/ BIOPSIES  04/2014   Dr Deatra Ina. Left sided colitis from prox descending to rectum. iNo inflammation at and proximal to splenic flexure.   [name] DILATION AND EVACUATION  08/21/2012   Procedure: DILATATION AND EVACUATION;  Surgeon: Logan Bores, MD;  Location: [facility];  Service: Gynecology;  Laterality: N/A;  . TONSILLECTOMY      Family History  Problem Relation Age of Onset  . Miscarriages / [place]    . Hypotension Neg Hx   . Alcohol abuse Neg Hx   . Arthritis Neg Hx   . Asthma Neg Hx   . Birth defects  Neg Hx   . COPD Neg Hx   . Depression Neg Hx   . Diabetes Neg Hx   . Drug abuse Neg Hx   . Hearing loss Neg Hx   . Heart disease Neg Hx   . Hyperlipidemia Neg Hx   . Hypertension Neg Hx   . Kidney disease Neg Hx   . Learning disabilities Neg Hx   . Mental illness Neg Hx   . Mental retardation Neg Hx   . Colon cancer Neg Hx   . Colon polyps Neg Hx   . Esophageal cancer Neg Hx   . Stomach cancer Neg Hx   . Rectal cancer Neg Hx     Social History   Social History  .  Marital status: Single    Spouse name: N/A  . Number of children: 2  . Years of education: N/A   Occupational History  . unemployed    Social History Main Topics  . Smoking status: Never Smoker  . Smokeless tobacco: Never Used  . Alcohol use No  . Drug use: No  . Sexual activity: Yes    Birth control/ protection: None   Other Topics Concern  . Not on file   Social History Narrative  . No narrative on file      Review of systems: Review of Systems  Constitutional: Negative for fever and chills.  HENT: Negative.   Eyes: Negative for blurred vision.  Respiratory: Negative for cough, shortness of breath and wheezing.   Cardiovascular: Negative for chest pain and palpitations.  Gastrointestinal: as per HPI Genitourinary: Negative for dysuria, urgency, frequency and hematuria.  Musculoskeletal: Negative for myalgias, back pain and joint pain.  Skin: Negative for itching and rash.  Neurological: Negative for dizziness, tremors, focal weakness, seizures and loss of consciousness.  Endo/Heme/Allergies: Positive for seasonal allergies.  Psychiatric/Behavioral: Negative for depression, suicidal ideas and hallucinations.  All other systems reviewed and are negative.   Physical Exam: Vitals:   07/13/16 1412  BP: 110/60  Pulse: 74   Body mass index is 26.46 kg/m. Gen:      No acute distress HEENT:  EOMI, sclera anicteric Neck:     No masses; no thyromegaly Lungs:    Clear to auscultation bilaterally; normal respiratory effort CV:         Regular rate and rhythm; no murmurs Abd:      + bowel sounds; soft, non-tender; no palpable masses, no distension Ext:    No edema; adequate peripheral perfusion Skin:      Warm and dry; no rash Neuro: alert and oriented x 3 Psych: normal mood and affect  Data Reviewed:  Reviewed chart in epic   Assessment and Plan/Recommendations:  30 year old female with predominantly left-sided ulcerative colitis on Humira every 2 weeks with  persistent diarrhea and blood per rectum We will obtain adalimumab trough level and also antibody to see if she has developed antibodies and hence decreased response to the medication If she has has significantly increased antibody level to adalimumab, will consider switching to [facility] suppositories daily at bedtime Check C. Difficile, CBC, CMP and CRP level Return in 3 months or sooner if needed Greater than 50% of the time used for counseling as well as treatment plan and follow-up. She had multiple questions which were answered to her satisfaction  K. Denzil Magnuson , MD [phone] Mon-Fri 8a-5p [phone] after 5p, weekends, holidays  CC: [place]

## 2016-07-13 NOTE — Patient Instructions (Signed)
We have given you [facility] samples today use daily at bedtime for 3 months   We will send in a prescripton to your pharmacy of [facility]   Go to the basement for labs today  Follow up in 3 months

## 2016-07-14 ENCOUNTER — Other Ambulatory Visit: Payer: Medicaid Other

## 2016-07-14 DIAGNOSIS — K5180 Other ulcerative colitis without complications: Secondary | ICD-10-CM

## 2016-07-15 LAB — CLOSTRIDIUM DIFFICILE BY PCR: CDIFFPCR: NOT DETECTED

## 2016-07-17 LAB — GASTROINTESTINAL PATHOGEN PANEL PCR
C. DIFFICILE TOX A/B, PCR: NOT DETECTED
CAMPYLOBACTER, PCR: NOT DETECTED
Cryptosporidium, PCR: NOT DETECTED
E COLI 0157, PCR: NOT DETECTED
E coli (ETEC) LT/ST PCR: NOT DETECTED
E coli (STEC) stx1/stx2, PCR: NOT DETECTED
GIARDIA LAMBLIA, PCR: NOT DETECTED
Norovirus, PCR: NOT DETECTED
Rotavirus A, PCR: NOT DETECTED
Salmonella, PCR: NOT DETECTED
Shigella, PCR: NOT DETECTED

## 2016-07-19 LAB — ADALIMUMAB+AB (SERIAL MONITOR)
ADALIMUMAB DRUG LEVEL: 4.6 ug/mL
Anti-Adalimumab Antibody: <25 ng/mL

## 2016-07-19 LAB — SERIAL MONITORING

## 2016-07-20 ENCOUNTER — Other Ambulatory Visit: Payer: Self-pay

## 2016-07-21 ENCOUNTER — Other Ambulatory Visit: Payer: Self-pay

## 2016-07-21 MED ORDER — ADALIMUMAB 20 MG/0.4ML ~~LOC~~ PSKT: 20.0000 mg | PREFILLED_SYRINGE | SUBCUTANEOUS | 5 refills | Status: DC

## 2016-07-21 MED ORDER — ADALIMUMAB 40 MG/0.8ML ~~LOC~~ AJKT: 40.0000 mg | AUTO-INJECTOR | SUBCUTANEOUS | 5 refills | Status: DC

## 2016-09-06 ENCOUNTER — Encounter (HOSPITAL_COMMUNITY): Payer: Self-pay | Admitting: Emergency Medicine

## 2016-09-06 ENCOUNTER — Ambulatory Visit (HOSPITAL_COMMUNITY)
Admission: EM | Admit: 2016-09-06 | Discharge: 2016-09-06 | Disposition: A | Discharge status: Home / Self Care | Payer: Medicaid Other | Attending: Family Medicine | Admitting: Family Medicine

## 2016-09-06 DIAGNOSIS — T310 Burns involving less than 10% of body surface: Secondary | ICD-10-CM

## 2016-09-06 DIAGNOSIS — Z23 Encounter for immunization: Secondary | ICD-10-CM

## 2016-09-06 MED ORDER — TETANUS-DIPHTH-ACELL PERTUSSIS 5-2.5-18.5 LF-MCG/0.5 IM SUSP
0.5000 mL | Freq: Once | INTRAMUSCULAR | Status: AC
  Administered 2016-09-06: 0.5 mL via INTRAMUSCULAR

## 2016-09-06 MED ORDER — TETANUS-DIPHTH-ACELL PERTUSSIS 5-2.5-18.5 LF-MCG/0.5 IM SUSP
INTRAMUSCULAR | Status: AC
  Filled 2016-09-06: qty 0.5
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
## 2016-10-27 ENCOUNTER — Ambulatory Visit: Payer: Medicaid Other | Admitting: Gastroenterology

## 2016-10-28 ENCOUNTER — Encounter (HOSPITAL_COMMUNITY): Payer: Self-pay

## 2016-10-28 ENCOUNTER — Inpatient Hospital Stay (HOSPITAL_COMMUNITY)
Admission: AD | Admit: 2016-10-28 | Discharge: 2016-10-28 | Disposition: A | Discharge status: Home / Self Care | Payer: Medicaid Other | Source: Ambulatory Visit | Attending: Obstetrics & Gynecology | Admitting: Obstetrics & Gynecology

## 2016-10-28 DIAGNOSIS — Z811 Family history of alcohol abuse and dependence: Secondary | ICD-10-CM | POA: Insufficient documentation

## 2016-10-28 DIAGNOSIS — Z9889 Other specified postprocedural states: Secondary | ICD-10-CM | POA: Diagnosis not present

## 2016-10-28 DIAGNOSIS — K5190 Ulcerative colitis, unspecified, without complications: Secondary | ICD-10-CM | POA: Insufficient documentation

## 2016-10-28 DIAGNOSIS — Z800 Family history of malignant neoplasm of digestive organs: Secondary | ICD-10-CM | POA: Diagnosis not present

## 2016-10-28 DIAGNOSIS — Z8261 Family history of arthritis: Secondary | ICD-10-CM | POA: Insufficient documentation

## 2016-10-28 DIAGNOSIS — Z79899 Other long term (current) drug therapy: Secondary | ICD-10-CM | POA: Insufficient documentation

## 2016-10-28 DIAGNOSIS — Z808 Family history of malignant neoplasm of other organs or systems: Secondary | ICD-10-CM | POA: Diagnosis not present

## 2016-10-28 DIAGNOSIS — Z818 Family history of other mental and behavioral disorders: Secondary | ICD-10-CM | POA: Insufficient documentation

## 2016-10-28 DIAGNOSIS — R110 Nausea: Secondary | ICD-10-CM

## 2016-10-28 DIAGNOSIS — B9689 Other specified bacterial agents as the cause of diseases classified elsewhere: Secondary | ICD-10-CM | POA: Insufficient documentation

## 2016-10-28 DIAGNOSIS — R109 Unspecified abdominal pain: Secondary | ICD-10-CM | POA: Diagnosis not present

## 2016-10-28 DIAGNOSIS — Z8249 Family history of ischemic heart disease and other diseases of the circulatory system: Secondary | ICD-10-CM | POA: Diagnosis not present

## 2016-10-28 DIAGNOSIS — N760 Acute vaginitis: Secondary | ICD-10-CM | POA: Insufficient documentation

## 2016-10-28 DIAGNOSIS — Z833 Family history of diabetes mellitus: Secondary | ICD-10-CM | POA: Diagnosis not present

## 2016-10-28 DIAGNOSIS — Z888 Allergy status to other drugs, medicaments and biological substances status: Secondary | ICD-10-CM | POA: Diagnosis not present

## 2016-10-28 DIAGNOSIS — Z825 Family history of asthma and other chronic lower respiratory diseases: Secondary | ICD-10-CM | POA: Diagnosis not present

## 2016-10-28 LAB — URINALYSIS, ROUTINE W REFLEX MICROSCOPIC
BILIRUBIN URINE: NEGATIVE
GLUCOSE, UA: NEGATIVE mg/dL
Hgb urine dipstick: NEGATIVE
KETONES UR: NEGATIVE mg/dL
Leukocytes, UA: NEGATIVE
Nitrite: NEGATIVE
PH: 7 (ref 5.0–8.0)
Protein, ur: NEGATIVE mg/dL
Specific Gravity, Urine: 1.004 — ABNORMAL LOW (ref 1.005–1.030)

## 2016-10-28 LAB — WET PREP, GENITAL
Sperm: NONE SEEN
TRICH WET PREP: NONE SEEN
YEAST WET PREP: NONE SEEN

## 2016-10-28 LAB — POCT PREGNANCY, URINE: Preg Test, Ur: NEGATIVE

## 2016-10-28 LAB — CBC WITH DIFFERENTIAL/PLATELET
BASOS ABS: 0 10*3/uL (ref 0.0–0.1)
Basophils Relative: 0 %
EOS PCT: 3 %
Eosinophils Absolute: 0.4 10*3/uL (ref 0.0–0.7)
HCT: 38.9 % (ref 36.0–46.0)
Hemoglobin: 13.4 g/dL (ref 12.0–15.0)
LYMPHS ABS: 5.3 10*3/uL — AB (ref 0.7–4.0)
Lymphocytes Relative: 46 %
MCH: 31.4 pg (ref 26.0–34.0)
MCHC: 34.4 g/dL (ref 30.0–36.0)
MCV: 91.1 fL (ref 78.0–100.0)
MONO ABS: 0.7 10*3/uL (ref 0.1–1.0)
Monocytes Relative: 6 %
NEUTROS PCT: 45 %
Neutro Abs: 5.3 10*3/uL (ref 1.7–7.7)
Other: 0 %
PLATELETS: 282 10*3/uL (ref 150–400)
RBC: 4.27 MIL/uL (ref 3.87–5.11)
RDW: 14.2 % (ref 11.5–15.5)
WBC: 11.7 10*3/uL — AB (ref 4.0–10.5)

## 2016-10-28 MED ORDER — METRONIDAZOLE 500 MG PO TABS: 500.0000 mg | ORAL_TABLET | Freq: Two times a day (BID) | ORAL | 0 refills | Status: DC

## 2016-10-28 NOTE — MAU Provider Note (Signed)
History     CSN: [id number]  Arrival date and time: 10/28/16 1558   First Provider Initiated Contact with Patient 10/28/16 1643      Chief Complaint  Patient presents with  . Abdominal Pain  . Nausea   HPI   [name] is a 30 y.o. female [phone] here in MAU with abdominal pain and nausea (no vomiting). Symptoms have been present for more than a week. The patient has a history ulcerative colitis and is followed by [facility], she  has not notified her GI dr of these symptoms. Her last visit was in August 2017. She is taking her medication as prescribed. This discomfort is similar to symptoms she has had with her "ulcers".  She denies bloody stools or fever.   The pain in her abdomen comes and goes. The pain is all over her abdomen.   OB History    Gravida Para Term Preterm AB Living   4 2 2   1 2    SAB TAB Ectopic Multiple Live Births   1       2      Past Medical History:  Diagnosis Date  . Allergy    SEASONAL  . Anemia 2008   Iron deficiency.  s/p transfusions 2009, 2010. Inconsistent compliance with po Iron.   . Chronic ulcerative colitis ([place]) 2007  . Headache(784.0)   . Non compliance w medication regimen 06/2015   stopped Remicade, may not have been taking Azulfidine.    Past Surgical History:  Procedure Laterality Date  . CESAREAN SECTION N/A 12/09/2013   Procedure: Primary Cesarean Section Delivery Baby Girl @ 1959, Apgars 8/8;  Surgeon: Logan Bores, MD;  Location: [facility];  Service: Obstetrics;  Laterality: N/A;  . COLONOSCOPY  01/19/2011   Dr Penelope Coop. no active UC  . COLONOSCOPY W/ BIOPSIES  04/2014   Dr Deatra Ina. Left sided colitis from prox descending to rectum. iNo inflammation at and proximal to splenic flexure.   [name] DILATION AND EVACUATION  08/21/2012   Procedure: DILATATION AND EVACUATION;  Surgeon: Logan Bores, MD;  Location: [facility];  Service: Gynecology;  Laterality: N/A;  . TONSILLECTOMY      Family History  Problem Relation Age  of Onset  . Miscarriages / [place]    . Hypotension Neg Hx   . Alcohol abuse Neg Hx   . Arthritis Neg Hx   . Asthma Neg Hx   . Birth defects Neg Hx   . COPD Neg Hx   . Depression Neg Hx   . Diabetes Neg Hx   . Drug abuse Neg Hx   . Hearing loss Neg Hx   . Heart disease Neg Hx   . Hyperlipidemia Neg Hx   . Hypertension Neg Hx   . Kidney disease Neg Hx   . Learning disabilities Neg Hx   . Mental illness Neg Hx   . Mental retardation Neg Hx   . Colon cancer Neg Hx   . Colon polyps Neg Hx   . Esophageal cancer Neg Hx   . Stomach cancer Neg Hx   . Rectal cancer Neg Hx     Social History  Substance Use Topics  . Smoking status: Never Smoker  . Smokeless tobacco: Never Used  . Alcohol use No    Allergies:  Allergies  Allergen Reactions  . Latex Rash and Other (See Comments)    gloves  . Remicade [Infliximab] Itching    GIVEN BENADRYL 50MG INJECTION  Prescriptions Prior to Admission  Medication Sig Dispense Refill Last Dose  . Adalimumab (HUMIRA PEN) 40 MG/0.8ML PNKT Inject 40 mg into the skin every 14 (fourteen) days. To be taken with the 63m/0.8ml pen 2 each 5 Past Month at Unknown time  . Adalimumab (HUMIRA) 20 MG/0.4ML PSKT Inject 20 mg into the skin every 14 (fourteen) days. Give with the 40 mg/0.856mpen 2 each 5 Past Month at Unknown time  . mesalamine (CANASA) 1000 MG suppository Place 1 suppository (1,000 mg total) rectally at bedtime. (Patient not taking: Reported on 10/28/2016) 30 suppository 2 Not Taking at Unknown time   Results for orders placed or performed during the hospital encounter of 10/28/16 (from the past 48 hour(s))  Urinalysis, Routine w reflex microscopic     Status: Abnormal   Collection Time: 10/28/16  4:03 PM  Result Value Ref Range   Color, Urine COLORLESS (A) YELLOW   APPearance CLEAR CLEAR   Specific Gravity, Urine 1.004 (L) 1.005 - 1.030   pH 7.0 5.0 - 8.0   Glucose, UA NEGATIVE NEGATIVE mg/dL   Hgb urine dipstick NEGATIVE  NEGATIVE   Bilirubin Urine NEGATIVE NEGATIVE   Ketones, ur NEGATIVE NEGATIVE mg/dL   Protein, ur NEGATIVE NEGATIVE mg/dL   Nitrite NEGATIVE NEGATIVE   Leukocytes, UA NEGATIVE NEGATIVE  Pregnancy, urine POC     Status: None   Collection Time: 10/28/16  4:05 PM  Result Value Ref Range   Preg Test, Ur NEGATIVE NEGATIVE    Comment:        THE SENSITIVITY OF THIS METHODOLOGY IS >24 mIU/mL   CBC with Differential     Status: Abnormal   Collection Time: 10/28/16  4:49 PM  Result Value Ref Range   WBC 11.7 (H) 4.0 - 10.5 K/uL   RBC 4.27 3.87 - 5.11 MIL/uL   Hemoglobin 13.4 12.0 - 15.0 g/dL   HCT 38.9 36.0 - 46.0 %   MCV 91.1 78.0 - 100.0 fL   MCH 31.4 26.0 - 34.0 pg   MCHC 34.4 30.0 - 36.0 g/dL   RDW 14.2 11.5 - 15.5 %   Platelets 282 150 - 400 K/uL   Neutrophils Relative % 45 %   Lymphocytes Relative 46 %   Monocytes Relative 6 %   Eosinophils Relative 3 %   Basophils Relative 0 %   Other 0 %   Neutro Abs 5.3 1.7 - 7.7 K/uL   Lymphs Abs 5.3 (H) 0.7 - 4.0 K/uL   Monocytes Absolute 0.7 0.1 - 1.0 K/uL   Eosinophils Absolute 0.4 0.0 - 0.7 K/uL   Basophils Absolute 0.0 0.0 - 0.1 K/uL  Wet prep, genital     Status: Abnormal   Collection Time: 10/28/16  4:49 PM  Result Value Ref Range   Yeast Wet Prep HPF POC NONE SEEN NONE SEEN   Trich, Wet Prep NONE SEEN NONE SEEN   Clue Cells Wet Prep HPF POC PRESENT (A) NONE SEEN   WBC, Wet Prep HPF POC FEW (A) NONE SEEN    Comment: MODERATE BACTERIA SEEN   Sperm NONE SEEN   HIV antibody     Status: None   Collection Time: 10/28/16  4:49 PM  Result Value Ref Range   HIV Screen 4th Generation wRfx Non Reactive Non Reactive    Comment: (NOTE) Performed At: [id number] RompD PhXQ:[id number]  Review of Systems  Constitutional: Negative for fever.  Gastrointestinal: Positive for  abdominal pain and nausea. Negative for constipation, diarrhea and vomiting.   Physical Exam    Blood pressure 126/65, pulse 84, temperature 98.8 F (37.1 C), temperature source Oral, resp. rate 16, last menstrual period 10/13/2016.  Physical Exam  Constitutional: She is oriented to person, place, and time. She appears well-developed and well-nourished.  Non-toxic appearance. She does not have a sickly appearance. She does not appear ill. No distress.  HENT:  Head: Normocephalic.  GI: Soft. Normal appearance and bowel sounds are normal. There is generalized tenderness. There is no rigidity, no rebound and no guarding.  Musculoskeletal: Normal range of motion.  Neurological: She is alert and oriented to person, place, and time.  Skin: Skin is warm. She is not diaphoretic.  Psychiatric: Her behavior is normal.    MAU Course  Procedures  None   MDM  CBC  Patient without fever.   Assessment and Plan   A:  1. Abdominal pain in female   2. Nausea   3. BV (bacterial vaginosis)    P:  Discharge home in stable condition Call [facility] on Monday to schedule in office visit Continue GI medications as directed  If symptoms worsen go to [facility] or [facility]  Rx: Flagyl if patient can tolerate. Discussed alternative of waiting for treatment until after GI appointment. - no alcohol with flagyl use.   Lezlie Lye, NP 10/30/2016 11:01 AM

## 2016-10-28 NOTE — Discharge Instructions (Signed)
Abdominal Pain, Adult Abdominal pain can be caused by many things. Often, abdominal pain is not serious and it gets better with no treatment or by being treated at home. However, sometimes abdominal pain is serious. Your health care provider will do a medical history and a physical exam to try to determine the cause of your abdominal pain. Follow these instructions at home:  Take over-the-counter and prescription medicines only as told by your health care provider. Do not take a laxative unless told by your health care provider.  Drink enough fluid to keep your urine clear or pale yellow.  Watch your condition for any changes.  Keep all follow-up visits as told by your health care provider. This is important. Contact a health care provider if:  Your abdominal pain changes or gets worse.  You are not hungry or you lose weight without trying.  You are constipated or have diarrhea for more than 2-3 days.  You have pain when you urinate or have a bowel movement.  Your abdominal pain wakes you up at night.  Your pain gets worse with meals, after eating, or with certain foods.  You are throwing up and cannot keep anything down.  You have a fever. Get help right away if:  Your pain does not go away as soon as your health care provider told you to expect.  You cannot stop throwing up.  Your pain is only in areas of the abdomen, such as the right side or the left lower portion of the abdomen.  You have bloody or black stools, or stools that look like tar.  You have severe pain, cramping, or bloating in your abdomen.  You have signs of dehydration, such as:  Dark urine, very little urine, or no urine.  Cracked lips.  Dry mouth.  Sunken eyes.  Sleepiness.  Weakness. This information is not intended to replace advice given to you by your health care provider. Make sure you discuss any questions you have with your health care provider. Document Released: 08/16/2005 Document  Revised: 05/26/2016 Document Reviewed: 04/19/2016 Elsevier Interactive Patient Education  2017 Elsevier Inc.  Ulcerative Colitis, Adult Ulcerative colitis is long-lasting (chronic) swelling (inflammation) of the large intestine (colon). Sores (ulcers) may also form on the colon. Ulcerative colitis is closely related to another condition of inflammation of the intestines that is called Crohn disease. Together, they are frequently referred to as inflammatory bowel disease (IBD). What are the causes? Ulcerative colitis is caused by increased activity of the immune system in the intestines. The immune system is the system that protects the body against harmful bacteria, viruses, fungi, and other things that can make you sick. When the immune system overacts, it causes inflammation. The cause of the increased immune system activity is not known. What increases the risk? Risk factors of ulcerative colitis include:  Age. This includes:  Being 27-24 years old.  Being older than 31 years old.  Having a family history of ulcerative colitis.  Being of Jewish descent. What are the signs or symptoms? Common symptoms of ulcerative colitis include rectal bleeding and diarrhea. There is a wide range of symptoms, and a person's symptoms depend on how severe the condition is. Additional symptoms may include:  Pain or cramping in the belly (abdomen).  Fever.  Fatigue.  Weight loss.  Night sweats.  Rectal pain.  Feeling the immediate need to have a bowel movement.  Nausea.  Loss of appetite.  Anemia.  Joint pain or soreness.  Eye irritation.  Certain skin rashes. How is this diagnosed? Ulcerative colitis may be diagnosed by:  Medical history and physical exam.  Blood tests and stool tests.  X-rays.  CT scans.  Colonoscopy. For this test, a flexible tube is inserted into your anus and your colon is examined.  Examination of a tissue sample from your colon (biopsy). How is  this treated? Treatment for ulcerative colitis may include medicines to:  Decrease inflammation.  Control your immune system. Surgery may also be necessary. Follow these instructions at home: Medicines and vitamins  Take medicines only as directed by your doctor. Do not take aspirin.  Ask your doctor if you should take any vitamins or supplements. Lifestyle  Exercise regularly.  Limit alcohol intake to no more than 1 drink per day for nonpregnant women and 2 drinks per day for men. One drink equals 12 ounces of beer, 5 ounces of wine, or 1 ounces of hard liquor. Eating and drinking  Drink enough fluid to keep your urine clear or pale yellow.  Ask your health care provider about the best diet for you. Follow the diet as directed by your health care provider. This may include:  Avoiding carbonated drinks.  Avoiding popcorn, vegetable skins, nuts, and other high-fiber foods when you have symptoms of ulcerative colitis.  Eating smaller meals more often.  Keeping a food diary. This may help you to find and avoid any foods that make you feel not well.  Limit your caffeine intake. General instructions  Keep all follow-up appointments as directed by your health care provider. This is important. Contact a health care provider if:  Your symptoms do not improve or get worse with treatment.  You continue to lose weight.  You have constant cramps or loose bowels.  You develop a new skin rash, skin sores, or eye problems.  You have a fever or chills. Get help right away if:  You have bloody diarrhea.  You have severe pain in your abdomen.  You vomit. This information is not intended to replace advice given to you by your health care provider. Make sure you discuss any questions you have with your health care provider. Document Released: 08/16/2005 Document Revised: 07/09/2016 Document Reviewed: 03/01/2015 Elsevier Interactive Patient Education  2017 [employer].

## 2016-10-28 NOTE — MAU Note (Signed)
Patient presents with onset of abdominal pain and nausea x 1 week, denies vomiting or diarrhea

## 2016-10-29 LAB — HIV ANTIBODY (ROUTINE TESTING W REFLEX): HIV SCREEN 4TH GENERATION: NONREACTIVE

## 2016-10-30 LAB — GC/CHLAMYDIA PROBE AMP (~~LOC~~) NOT AT ARMC
CHLAMYDIA, DNA PROBE: NEGATIVE
NEISSERIA GONORRHEA: NEGATIVE

## 2016-10-31 ENCOUNTER — Other Ambulatory Visit: Payer: Self-pay

## 2016-10-31 DIAGNOSIS — K50119 Crohn's disease of large intestine with unspecified complications: Secondary | ICD-10-CM

## 2016-11-04 ENCOUNTER — Encounter (HOSPITAL_COMMUNITY): Payer: Self-pay | Admitting: Nurse Practitioner

## 2016-11-04 DIAGNOSIS — Z91040 Latex allergy status: Secondary | ICD-10-CM | POA: Diagnosis not present

## 2016-11-04 DIAGNOSIS — R1084 Generalized abdominal pain: Secondary | ICD-10-CM | POA: Insufficient documentation

## 2016-11-04 LAB — CBC
HCT: 41.2 % (ref 36.0–46.0)
Hemoglobin: 14.3 g/dL (ref 12.0–15.0)
MCH: 31.6 pg (ref 26.0–34.0)
MCHC: 34.7 g/dL (ref 30.0–36.0)
MCV: 91.2 fL (ref 78.0–100.0)
PLATELETS: 230 10*3/uL (ref 150–400)
RBC: 4.52 MIL/uL (ref 3.87–5.11)
RDW: 14.2 % (ref 11.5–15.5)
WBC: 14.8 10*3/uL — ABNORMAL HIGH (ref 4.0–10.5)

## 2016-11-04 NOTE — ED Triage Notes (Signed)
Pt is c/o abdominal pain that she is describing as cramping and associated with nausea.

## 2016-11-05 ENCOUNTER — Emergency Department (HOSPITAL_COMMUNITY)
Admission: EM | Admit: 2016-11-05 | Discharge: 2016-11-05 | Disposition: A | Discharge status: Home / Self Care | Payer: Medicaid Other | Attending: Emergency Medicine | Admitting: Emergency Medicine

## 2016-11-05 DIAGNOSIS — R109 Unspecified abdominal pain: Secondary | ICD-10-CM

## 2016-11-05 LAB — URINALYSIS, ROUTINE W REFLEX MICROSCOPIC
Bilirubin Urine: NEGATIVE
GLUCOSE, UA: NEGATIVE mg/dL
HGB URINE DIPSTICK: NEGATIVE
Ketones, ur: NEGATIVE mg/dL
LEUKOCYTES UA: NEGATIVE
Nitrite: NEGATIVE
PH: 6 (ref 5.0–8.0)
Protein, ur: NEGATIVE mg/dL
Specific Gravity, Urine: 1.023 (ref 1.005–1.030)

## 2016-11-05 LAB — COMPREHENSIVE METABOLIC PANEL
ALK PHOS: 63 U/L (ref 38–126)
ALT: 11 U/L — ABNORMAL LOW (ref 14–54)
ANION GAP: 7 (ref 5–15)
AST: 15 U/L (ref 15–41)
Albumin: 4.4 g/dL (ref 3.5–5.0)
BILIRUBIN TOTAL: 0.4 mg/dL (ref 0.3–1.2)
BUN: 13 mg/dL (ref 6–20)
CALCIUM: 9.2 mg/dL (ref 8.9–10.3)
CO2: 22 mmol/L (ref 22–32)
Chloride: 107 mmol/L (ref 101–111)
Creatinine, Ser: 0.64 mg/dL (ref 0.44–1.00)
GFR calc Af Amer: >60 mL/min (ref >60)
GFR calc non Af Amer: >60 mL/min (ref >60)
GLUCOSE: 98 mg/dL (ref 65–99)
POTASSIUM: 4 mmol/L (ref 3.5–5.1)
SODIUM: 136 mmol/L (ref 135–145)
TOTAL PROTEIN: 7.9 g/dL (ref 6.5–8.1)

## 2016-11-05 LAB — POC URINE PREG, ED: Preg Test, Ur: NEGATIVE

## 2016-11-05 LAB — LIPASE, BLOOD: Lipase: 35 U/L (ref 11–51)

## 2016-11-05 NOTE — ED Provider Notes (Signed)
[facility] DEPT Provider Note   CSN: [id number] Arrival date & time: 11/04/16  2247    By signing my name below, I, Macon Large, attest that this documentation has been prepared under the direction and in the presence of Ripley Fraise, MD. Electronically Signed: Macon Large, ED Scribe. 11/05/16. 1:51 AM.  History   Chief Complaint Chief Complaint  Patient presents with  . Abdominal Pain  . Nausea   The history is provided by the patient. No language interpreter was used.  Abdominal Pain   This is a new problem. The current episode started more than 1 week ago. The problem occurs constantly. The problem has not changed since onset.The pain is located in the generalized abdominal region. The pain is moderate. Associated symptoms include nausea. Pertinent negatives include fever, diarrhea, vomiting, dysuria, hematuria and headaches. Nothing aggravates the symptoms. Nothing relieves the symptoms.   HPI Comments: Joyce Solomon is a 30 y.o. female who presents to the Emergency Department complaining of moderate, constant, generalized abdominal pain onset a week ago. She reports associated cramping in her lower abdominal region accompanied by nausea. No alleviating factors noted. She denies vomiting, fever, diarrhea, dysuria, hematuria, vaginal bleeding discharge, blood in stool. LMP 11/24. No additional complaints at this time.   Past Medical History:  Diagnosis Date  . Allergy    SEASONAL  . Anemia 2008   Iron deficiency.  s/p transfusions 2009, 2010. Inconsistent compliance with po Iron.   . Chronic ulcerative colitis ([place]) 2007  . Headache(784.0)   . Non compliance w medication regimen 06/2015   stopped Remicade, may not have been taking Azulfidine.    Patient Active Problem List   Diagnosis Date Noted  . Rectal bleeding 04/23/2014  . Status post C-section 12/10/2013  . IUGR (intrauterine growth restriction) 12/09/2013  . DENTAL CARIES 12/22/2009  .  HYPERTHYROIDISM 08/03/2008  . CONSTIPATION 07/29/2008  . ACNE, MILD 07/29/2008  . ANEMIA 06/30/2008  . Ulcerative colitis ([place]) 11/20/2005    Past Surgical History:  Procedure Laterality Date  . CESAREAN SECTION N/A 12/09/2013   Procedure: Primary Cesarean Section Delivery Baby Girl @ 1959, Apgars 8/8;  Surgeon: Logan Bores, MD;  Location: [facility];  Service: Obstetrics;  Laterality: N/A;  . COLONOSCOPY  01/19/2011   Dr Penelope Coop. no active UC  . COLONOSCOPY W/ BIOPSIES  04/2014   Dr Deatra Ina. Left sided colitis from prox descending to rectum. iNo inflammation at and proximal to splenic flexure.   [name] DILATION AND EVACUATION  08/21/2012   Procedure: DILATATION AND EVACUATION;  Surgeon: Logan Bores, MD;  Location: [facility];  Service: Gynecology;  Laterality: N/A;  . TONSILLECTOMY      OB History    Gravida Para Term Preterm AB Living   4 2 2   1 2    SAB TAB Ectopic Multiple Live Births   1       2       Home Medications    Prior to Admission medications   Medication Sig Start Date End Date Taking? Authorizing Provider  Adalimumab (HUMIRA PEN) 40 MG/0.8ML PNKT Inject 40 mg into the skin every 14 (fourteen) days. To be taken with the 51m/0.8ml pen 07/21/16  Yes KMauri Pole MD  Adalimumab (HUMIRA) 20 MG/0.4ML PSKT Inject 20 mg into the skin every 14 (fourteen) days. Give with the 40 mg/0.852mpen 07/21/16  Yes KaMauri PoleMD  mesalamine (CANASA) 1000 MG suppository Place 1 suppository (1,000 mg total) rectally at bedtime. Patient  not taking: Reported on 11/05/2016 07/13/16   Mauri Pole, MD  metroNIDAZOLE (FLAGYL) 500 MG tablet Take 1 tablet (500 mg total) by mouth 2 (two) times daily. Patient not taking: Reported on 11/05/2016 10/28/16   Lezlie Lye, NP    Family History Family History  Problem Relation Age of Onset  . Miscarriages / [place]    . Hypotension Neg Hx   . Alcohol abuse Neg Hx   . Arthritis Neg Hx   . Asthma Neg Hx   . Birth defects  Neg Hx   . COPD Neg Hx   . Depression Neg Hx   . Diabetes Neg Hx   . Drug abuse Neg Hx   . Hearing loss Neg Hx   . Heart disease Neg Hx   . Hyperlipidemia Neg Hx   . Hypertension Neg Hx   . Kidney disease Neg Hx   . Learning disabilities Neg Hx   . Mental illness Neg Hx   . Mental retardation Neg Hx   . Colon cancer Neg Hx   . Colon polyps Neg Hx   . Esophageal cancer Neg Hx   . Stomach cancer Neg Hx   . Rectal cancer Neg Hx     Social History Social History  Substance Use Topics  . Smoking status: Never Smoker  . Smokeless tobacco: Never Used  . Alcohol use No     Allergies   Latex and Remicade [infliximab]   Review of Systems Review of Systems  Constitutional: Negative for fever.  Gastrointestinal: Positive for abdominal pain and nausea. Negative for blood in stool, diarrhea and vomiting.       + cramping   Genitourinary: Negative for dysuria, hematuria, vaginal bleeding and vaginal discharge.  Neurological: Negative for headaches.  All other systems reviewed and are negative.    Physical Exam Updated Vital Signs BP 114/76 (BP Location: Left Arm)   Pulse 80   Temp 98.3 F (36.8 C) (Oral)   Resp 16   Ht 4' 11"  (1.499 m)   Wt 131 lb 4 oz (59.5 kg)   LMP 10/13/2016   SpO2 100%   BMI 26.51 kg/m   Physical Exam  CONSTITUTIONAL: Well developed/well nourished HEAD: Normocephalic/atraumatic EYES: EOMI/PERRL ENMT: Mucous membranes moist NECK: supple no meningeal signs SPINE/BACK:entire spine nontender CV: S1/S2 noted, no murmurs/rubs/gallops noted LUNGS: Lungs are clear to auscultation bilaterally, no apparent distress ABDOMEN: soft, nontender, no rebound or guarding, bowel sounds noted throughout abdomen GU:no cva tenderness NEURO: Pt is awake/alert/appropriate, moves all extremitiesx4.  No facial droop.   EXTREMITIES: pulses normal/equal, full ROM SKIN: warm, color normal PSYCH: no abnormalities of mood noted, alert and oriented to situation  ED  Treatments / Results   DIAGNOSTIC STUDIES: Oxygen Saturation is 100% on Ra, normal by my interpretation.    COORDINATION OF CARE: 1:38 AM Discussed treatment plan with pt at bedside which includes labs and pt agreed to plan.  Labs (all labs ordered are listed, but only abnormal results are displayed) Labs Reviewed  COMPREHENSIVE METABOLIC PANEL - Abnormal; Notable for the following:       Result Value   ALT 11 (*)    All other components within normal limits  CBC - Abnormal; Notable for the following:    WBC 14.8 (*)    All other components within normal limits  LIPASE, BLOOD  URINALYSIS, ROUTINE W REFLEX MICROSCOPIC  POC URINE PREG, ED    EKG  EKG Interpretation None  Radiology No results found.  Procedures Procedures (including critical care time)  Medications Ordered in ED Medications - No data to display   Initial Impression / Assessment and Plan / ED Course  I have reviewed the triage vital signs and the nursing notes.  Pertinent labs  results that were available during my care of the patient were reviewed by me and considered in my medical decision making (see chart for details).  Clinical Course     Labs reassuring Pt without any pain in the ED and no focal tenderness She is awake/alert, no distress, using phone I will defer further workup for now  Final Clinical Impressions(s) / ED Diagnoses   Final diagnoses:  Abdominal cramping    New Prescriptions New Prescriptions   No medications on file    I personally performed the services described in this documentation, which was scribed in my presence. The recorded information has been reviewed and is accurate.        Ripley Fraise, MD 11/05/16 [phone]

## 2016-11-05 NOTE — Discharge Instructions (Signed)
Abdominal (belly) pain can be caused by many things. any cases can be observed and treated at home after initial evaluation in the emergency department. Even though you are being discharged home, abdominal pain can be unpredictable. Therefore, you need a repeated exam if your pain does not resolve, returns, or worsens. Most patients with abdominal pain don't have to be admitted to the hospital or have surgery, but serious problems like appendicitis and gallbladder attacks can start out as nonspecific pain. Many abdominal conditions cannot be diagnosed in one visit, so follow-up evaluations are very important. SEEK IMMEDIATE MEDICAL ATTENTION IF: The pain does not go away or becomes severe, particularly over the next 8-12 hours.  A temperature above 100.20F develops.  Repeated vomiting occurs (multiple episodes).  The pain becomes localized to portions of the abdomen. The right side could possibly be appendicitis. In an adult, the left lower portion of the abdomen could be colitis or diverticulitis.  Blood is being passed in stools or vomit (bright red or black tarry stools).  Return also if you develop chest pain, difficulty breathing, dizziness or fainting, or become confused, poorly responsive, or inconsolable.

## 2016-12-02 ENCOUNTER — Inpatient Hospital Stay (HOSPITAL_COMMUNITY)
Admission: AD | Admit: 2016-12-02 | Discharge: 2016-12-02 | Disposition: A | Discharge status: Home / Self Care | Payer: Medicaid Other | Source: Ambulatory Visit | Attending: Family Medicine | Admitting: Family Medicine

## 2016-12-02 DIAGNOSIS — N926 Irregular menstruation, unspecified: Secondary | ICD-10-CM | POA: Diagnosis present

## 2016-12-02 NOTE — MAU Provider Note (Signed)
S:  Joyce Solomon is a 31 y.o. female [phone] here in MAU for a pregnancy test. She is due for her period this week, however she had a positive pregnancy test at home.  Currently she is without complaints, she denies pain or bleeding.    O:  GENERAL: Well-developed, well-nourished female in no acute distress.  LUNGS: Effort normal SKIN: Warm, dry and without erythema PSYCH: Normal mood and affect  Vitals:   12/02/16 1115  BP: 118/60  Pulse: 82  Resp: 18  Temp: 98.4 F (36.9 C)     A:  1. Missed period     P:  Discharge home in stable condition.  Patient is without pain or bleeding at this time. A pregnancy test in the emergency room is not warranted.  The patient is encouraged to go to the [facility] M-Th 8:30-4:30 for pregnancy confirmation  Strict return precautions discussed  Lezlie Lye, NP 12/02/2016 2:56 PM    Lezlie Lye, NP 12/02/2016 11:18 AM

## 2016-12-02 NOTE — MAU Note (Signed)
Pt presents to MAU with a + HPT. Pt states she just found out she was pregnant and wants to make sure everything is ok and how far along she is.

## 2016-12-04 ENCOUNTER — Encounter (HOSPITAL_COMMUNITY): Payer: Self-pay | Admitting: *Deleted

## 2016-12-04 ENCOUNTER — Ambulatory Visit (HOSPITAL_COMMUNITY)
Admission: EM | Admit: 2016-12-04 | Discharge: 2016-12-04 | Disposition: A | Discharge status: Home / Self Care | Payer: Medicaid Other | Attending: Family Medicine | Admitting: Family Medicine

## 2016-12-04 DIAGNOSIS — R1032 Left lower quadrant pain: Secondary | ICD-10-CM | POA: Diagnosis not present

## 2016-12-04 DIAGNOSIS — R51 Headache: Secondary | ICD-10-CM | POA: Diagnosis not present

## 2016-12-04 DIAGNOSIS — R519 Headache, unspecified: Secondary | ICD-10-CM

## 2016-12-04 DIAGNOSIS — Z3201 Encounter for pregnancy test, result positive: Secondary | ICD-10-CM | POA: Diagnosis not present

## 2016-12-04 DIAGNOSIS — R1031 Right lower quadrant pain: Secondary | ICD-10-CM

## 2016-12-04 LAB — POCT PREGNANCY, URINE: Preg Test, Ur: POSITIVE — AB

## 2016-12-04 MED ORDER — ACETAMINOPHEN 325 MG PO TABS
650.0000 mg | ORAL_TABLET | Freq: Once | ORAL | Status: AC
  Administered 2016-12-04: 650 mg via ORAL

## 2016-12-04 MED ORDER — ACETAMINOPHEN 325 MG PO TABS
ORAL_TABLET | ORAL | Status: AC
  Filled 2016-12-04: qty 2

## 2016-12-04 NOTE — Discharge Instructions (Signed)
Headaches during pregnancy could be due to mild dehydration. Be sure to stay well hydrated and get plenty of rest.  It is important to follow up with an OB/GYN for your first prenatal visit as soon as possible.   Emergency Department Resource Guide 1) Find a Doctor and Pay Out of Pocket Although you won't have to find out who is covered by your insurance plan, it is a good idea to ask around and get recommendations. You will then need to call the office and see if the doctor you have chosen will accept you as a new patient and what types of options they offer for patients who are self-pay. Some doctors offer discounts or will set up payment plans for their patients who do not have insurance, but you will need to ask so you aren't surprised when you get to your appointment.  2) Contact Your Local Health Department Not all health departments have doctors that can see patients for sick visits, but many do, so it is worth a call to see if yours does. If you don't know where your local health department is, you can check in your phone book. The CDC also has a tool to help you locate your state's health department, and many state websites also have listings of all of their local health departments.  3) Find a [facility] If your illness is not likely to be very severe or complicated, you may want to try a walk in clinic. These are popping up all over the country in pharmacies, drugstores, and shopping centers. They're usually staffed by nurse practitioners or physician assistants that have been trained to treat common illnesses and complaints. They're usually fairly quick and inexpensive. However, if you have serious medical issues or chronic medical problems, these are probably not your best option.  No Primary Care Doctor: Call Health Connect at  [phone] - they can help you locate a primary care doctor that  accepts your insurance, provides certain services, etc. Physician Referral Service-  [phone]  Chronic Pain Problems: Organization         Address  Phone   Notes  [facility]  [phone] Patients need to be referred by their primary care doctor.   Medication Assistance: Organization         Address  Phone   Notes  [facility] Medication [facility] [address] [place], [place] [address] [phone] --Must be a resident of [facility] -- Must have NO insurance coverage whatsoever (no Medicaid/ Medicare, etc.) -- The pt. MUST have a primary care doctor that directs their care regularly and follows them in the community   MedAssist  [phone]   [employer]  [phone]    Agencies that provide inexpensive medical care: Microbiologist  Notes  [place]  [phone]   [facility]    [phone]   [facility] [place], [place] [address] [phone]   [place] [address], [place] [phone]   Planned Parenthood    [phone]   [facility]    [phone]   [place] and [place] [address], [place] Phone:  [phone], Fax:  [phone] Hours of Operation:  9 am - 6 pm, M-F.  Also accepts Medicaid/Medicare and self-pay.  [facility] for [place] [address], [address], [place] Phone: [phone], Fax: [phone]. Hours of Operation:  8:30 am - 5:30 pm, M-F.  Also accepts Medicaid and self-pay.  [facility] [place] [address] [place] Phone: [phone]   [place], [place], [place] [phone], Ext. [phone] Mondays & Thursdays: 7-9 AM.  First 15 patients are seen on a first come, first serve basis.    [place] Providers:  Organization         Address                                                                       Phone                               Notes  [facility] [address], [place] [phone] Also accepts self-pay patients.  [facility] [address], [place]  [phone]   [place], [address], [place] [phone]   [facility] [address] [place] [phone]   Lucianne Lei [address], [place]   [phone] Only accepts [place] Access [place] patients after they have their name applied to their card.   Self-Pay (no insurance) in [facility]:   Organization         Address                                                     Phone               Notes  Sickle Cell Patients, [facility] [place] [phone]   [facility] [place] [phone]   [facility]is  [place], [place], [place] [phone]   [facility]/Dr. Osei-Bonsu  [address], [place] or [address], [place] [phone] Phone number for both [place] and [place] locations is the same.  Urgent Medical and [facility] [address], [place] [phone]   [facility] [address], [place] or [address] [phone]   [place] [facility] [place] [phone], phone; [phone], fax Sees patients 1st and 3rd Saturday of every month.  Must not qualify for public or private insurance (i.e. Medicaid, Medicare, [place] Health Choice, Veterans' Benefits)  Household income should be no more than 200% of the poverty level The clinic cannot treat you if you are pregnant or think you are pregnant  Sexually  transmitted diseases are not treated at the clinic.    Dental Care: Organization         Address                                  Phone                       Notes  [facility] Department of [facility] [phone] Accepts children up to age 12 who are enrolled in [place] or [place]; pregnant women with a Medicaid card; and children who have applied for Medicaid or [place] Health Choice, but were declined, whose parents can pay a reduced fee at time of service.  [facility] Department of [facility]  [address], [place] [phone] Accepts children up to age 70 who are enrolled in [place] or [place]; pregnant women with a Medicaid card; and children who have applied for Medicaid or [place] Health Choice, but were declined, whose parents can pay a reduced fee at time of service.  [facility] Access PRO[facility]  [place] [phone] Patients are seen by appointment only. Walk-ins are not accepted. [place] will see patients 6 years of age and older. Monday - Tuesday (8am-5pm) Most Wednesdays (8:30-5pm) $30 per visit, cash only  [facility] Adult Dental Access PROGRAM  [address], [facility] [phone] Patients are seen by appointment only. Walk-ins are not accepted. [place] will see patients 69 years of age and older. One Wednesday Evening (Monthly: Volunteer Based).  $30 per visit, cash only  [place]  [phone] for adults; Children under age 67, call Graduate Pediatric Dentistry at [phone]. Children aged 39-14, please call [phone] to request a pediatric application.  Dental services are provided in all areas of dental care including fillings, crowns and bridges, complete and partial dentures, implants, gum treatment, root canals, and extractions. Preventive care is also provided. Treatment is  provided to both adults and children. Patients are selected via a lottery and there is often a waiting list.   [facility] [address], [place]  [phone] [email or web address]   [facility] [address] [place], [place] [phone], Ext. [phone] Second and Fourth Thursday of each month, opens at 6:30 AM; Clinic ends at 9 AM.  Patients are seen on a first-come first-served basis, and a limited number are seen during each clinic.   [facility]  [address] [place], [place] [phone]   Eligibility Requirements You must have lived in [place], [place], or [place] counties for at least the last three months.   You cannot be eligible for state or federal sponsored [employer], including [employer], [place], or [employer].   You generally cannot be eligible for healthcare insurance through your employer.    How to apply: Eligibility screenings are held every  Tuesday and Wednesday afternoon from 1:00 pm until 4:00 pm. You do not need an appointment for the interview!  [facility] [address], [place], [place]   [place]  [place] Department  [place]  [phone]    Behavioral Health Resources in the Community: Intensive Outpatient Programs Organization         Address                                              Phone              Notes  [place]. [address], [place], [place] [phone]   [facility] Outpatient [address], [place], [place]   ADS: Alcohol & Drug S[facility]cs [address] [place], [place]   [place] [address],  [place], [place] or [phone]   Substance Abuse Resources Organization         Address                                Phone  Notes  Alcohol and Drug  Services  [phone]   [place]  [phone]   The [place]   Chinita Pester  [phone]   Residential & Outpatient Substance Abuse Program  [phone]   Psychological Services Organization         Address                                  Phone                Notes  [facility] [place]  [place]  [phone]   [place] [address] [place] or [phone]    Mobile Crisis Teams Organization         Address  Phone  Notes  [facility], Mobile Crisis Care Unit  [phone]   Assertive Psychotherapeutic Services  [address] [place], [place]   [place] [address] [place] [phone]    Self-Help/Support Groups Organization         Address                         Phone             Notes  [place]. of [place] - variety of support groups  [place] Call for more information  Narcotics Anonymous (NA), [facility] [address], [employer] [place]  2 meetings at this location   Special educational needs teacher         Address                                                    Phone              Notes  [facility]idential Treatment [phone]  [address]    [place]  [phone]   [facility]  [address] [place] [address], [place], [place]   [place], [place] [phone] Admissions: 8am-3pm M-F  Incentives Substance [place] [address]    [place], [place] [phone]   [facility] [address] [place], [place], [place]   [facility] [address],  [place], [place]   Insight Programs - Intensive Outpatient [address], [place], [place]   [facility].,  [place], [place] [phone] or  [phone]   Residential Treatment Services (RTS) [address], [place], [place] Accepts Medicaid  [facility] [place] [address],  [place] [phone] Substance Abuse/Addiction Treatment   [facility] Organization         Address                                                            Phone                    Notes  [facility]  [phone]   Domenic Schwab, PhD [address] Arlis [place], [place]   [phone] or [phone]   [place], [place] [phone]   [facility] [address], [place], [place] [phone] Insurance/Medicaid/sponsorship through [facility] Families [address], [address]                                    [place], [place] [phone] [place] [address], [place] [phone]    Dr. Adele Schilder  [phone]   [facility] Dept. 1) [address] [place] 2) [place] 3)  [place] [address], [place] [phone]  [phone]   [place] [phone] or [phone] (After Hours)

## 2016-12-04 NOTE — ED Provider Notes (Signed)
CSN: [id number]     Arrival date & time 12/04/16  1654 History   First MD Initiated Contact with Patient 12/04/16 1916     Chief Complaint  Patient presents with  . Possible Pregnancy   (Consider location/radiation/quality/duration/timing/severity/associated sxs/prior Treatment) HPI Joyce Solomon is a 31 y.o. female [phone] presenting to UC with c/o lower abdominal cramping that is mild and intermittent for 2 days, mild nausea and a positive home pregnancy test from 2 days ago.  Pt requesting to confirm pregnancy.  She is also having a mild generalized headache that started this morning.  She took a BC powder w/o relief.  Denies vaginal bleeding or discharge. Pt notes she was not attempting to become pregnant. She was seen at [facility] to confirm positive home pregnancy test, however, no confirmation pregnancy test was warranted at that time.  [facility].  Pt notes she was unable to go and was concerned about her headache that started this morning.   She notes she went to [facility] for prior pregnancies but notes they are not taking new patients at this time.   Past Medical History:  Diagnosis Date  . Allergy    SEASONAL  . Anemia 2008   Iron deficiency.  s/p transfusions 2009, 2010. Inconsistent compliance with po Iron.   . Chronic ulcerative colitis ([place]) 2007  . Headache(784.0)   . Non compliance w medication regimen 06/2015   stopped Remicade, may not have been taking Azulfidine.   Past Surgical History:  Procedure Laterality Date  . CESAREAN SECTION N/A 12/09/2013   Procedure: Primary Cesarean Section Delivery Baby Girl @ 1959, Apgars 8/8;  Surgeon: Logan Bores, MD;  Location: [facility];  Service: Obstetrics;  Laterality: N/A;  . COLONOSCOPY  01/19/2011   Dr Penelope Coop. no active UC  . COLONOSCOPY W/ BIOPSIES  04/2014   Dr Deatra Ina. Left sided colitis from prox descending to rectum. iNo inflammation at and proximal to splenic flexure.    [name] DILATION AND EVACUATION  08/21/2012   Procedure: DILATATION AND EVACUATION;  Surgeon: Logan Bores, MD;  Location: [facility];  Service: Gynecology;  Laterality: N/A;  . TONSILLECTOMY     Family History  Problem Relation Age of Onset  . Miscarriages / [place]    . Hypotension Neg Hx   . Alcohol abuse Neg Hx   . Arthritis Neg Hx   . Asthma Neg Hx   . Birth defects Neg Hx   . COPD Neg Hx   . Depression Neg Hx   . Diabetes Neg Hx   . Drug abuse Neg Hx   . Hearing loss Neg Hx   . Heart disease Neg Hx   . Hyperlipidemia Neg Hx   . Hypertension Neg Hx   . Kidney disease Neg Hx   . Learning disabilities Neg Hx   . Mental illness Neg Hx   . Mental retardation Neg Hx   . Colon cancer Neg Hx   . Colon polyps Neg Hx   . Esophageal cancer Neg Hx   . Stomach cancer Neg Hx   . Rectal cancer Neg Hx    Social History  Substance Use Topics  . Smoking status: Never Smoker  . Smokeless tobacco: Never Used  . Alcohol use No   OB History    Gravida Para Term Preterm AB Living   4 2 2   1 2    SAB TAB Ectopic Multiple Live Births   1  2     Review of Systems  Constitutional: Negative for chills and fever.  Gastrointestinal: Positive for abdominal pain and nausea. Negative for diarrhea and vomiting.  Genitourinary: Negative for dysuria, frequency, pelvic pain, urgency, vaginal bleeding, vaginal discharge and vaginal pain.  Musculoskeletal: Negative for arthralgias and myalgias.  Neurological: Positive for headaches. Negative for dizziness and light-headedness.    Allergies  Latex and Remicade [infliximab]  Home Medications   Prior to Admission medications   Medication Sig Start Date End Date Taking? Authorizing Provider  Adalimumab (HUMIRA PEN) 40 MG/0.8ML PNKT Inject 40 mg into the skin every 14 (fourteen) days. Pt uses with 63m pen every 14 days.    Historical Provider, MD  Adalimumab (HUMIRA) 20 MG/0.4ML PSKT Inject 20 mg into the skin every 14 (fourteen) days. Pt  uses with 493mpen every 14 days.    Historical Provider, MD   Meds Ordered and Administered this Visit   Medications  acetaminophen (TYLENOL) tablet 650 mg (650 mg Oral Given 12/04/16 1942)    LMP 11/08/2016  No data found.   Physical Exam  Constitutional: She is oriented to person, place, and time. She appears well-developed and well-nourished. No distress.  HENT:  Head: Normocephalic and atraumatic.  Eyes: EOM are normal.  Neck: Normal range of motion.  Cardiovascular: Normal rate.   Pulmonary/Chest: Effort normal.  Abdominal: Soft. She exhibits no distension and no mass. There is no tenderness. There is no rebound and no guarding.  Musculoskeletal: Normal range of motion.  Neurological: She is alert and oriented to person, place, and time.  Skin: Skin is warm and dry. She is not diaphoretic.  Psychiatric: She has a normal mood and affect. Her behavior is normal.  Nursing note and vitals reviewed.   Urgent Care Course   Clinical Course     Procedures (including critical care time)  Labs Review Labs Reviewed  POCT PREGNANCY, URINE - Abnormal; Notable for the following:       Result Value   Preg Test, Ur POSITIVE (*)    All other components within normal limits    Imaging Review No results found.   MDM   1. Positive pregnancy test   2. Generalized headache   3. Bilateral lower abdominal cramping    Pt requesting confirmation of home urine pregnancy. Mild intermittent lower abdominal cramping. Abdomen is soft and non-tender on exam. Pt denies vaginal bleeding. No indication for pelvic exam in UC setting at this time.  Urine preg: POSITIVE. Encouraged to establish care with an OB/GYN as soon as possible for prenatal care. Info for [facility] [place]ided.    ErNoland FordycePA-C 12/04/16 2101

## 2016-12-04 NOTE — ED Triage Notes (Signed)
Headache  Today      Low    abd  Cramping   2  Days   Ago  Had  Recent   Positive  preg  Test  At  Home       No  Vaginal  Bleeding  Pt  Also  Has  Nausea

## 2016-12-08 ENCOUNTER — Telehealth: Payer: Self-pay | Admitting: Gastroenterology

## 2016-12-08 NOTE — Telephone Encounter (Signed)
Looks like she was confirmed pregnant over the weekend per ED notes.  Please advise if ok to continue Humira?

## 2016-12-08 NOTE — Telephone Encounter (Signed)
Patient notified of the recommendations. She will call back if she has additional questions or concerns.

## 2016-12-08 NOTE — Telephone Encounter (Signed)
She will need to continue Humira through her pregnancy given the severity of her ulcerative colitis as can have a flare if she discontinues it and thereby can adversely effect the pregnancy.  Humira is considered to be compatible with pregnancy and per current recommendation hold it for 4 weeks prior to the due date and resume after child birth.  She will need to follow up with high risk Ob & Gyn

## 2017-01-04 ENCOUNTER — Telehealth: Payer: Self-pay | Admitting: Gastroenterology

## 2017-01-04 ENCOUNTER — Other Ambulatory Visit: Payer: Self-pay

## 2017-01-04 DIAGNOSIS — Z79899 Other long term (current) drug therapy: Secondary | ICD-10-CM

## 2017-01-04 DIAGNOSIS — K51218 Ulcerative (chronic) proctitis with other complication: Secondary | ICD-10-CM

## 2017-01-04 NOTE — Telephone Encounter (Signed)
Pa forms from [employer] completed. Awaiting provider signature.

## 2017-01-05 NOTE — Telephone Encounter (Signed)
Patient contacted. She is aware she needs to go to the lab to have her TB screening. Order for Joyce Solomon entered.

## 2017-01-09 ENCOUNTER — Encounter: Payer: Medicaid Other | Admitting: Certified Nurse Midwife

## 2017-01-10 ENCOUNTER — Other Ambulatory Visit: Payer: Medicaid Other

## 2017-01-10 DIAGNOSIS — K51218 Ulcerative (chronic) proctitis with other complication: Secondary | ICD-10-CM

## 2017-01-10 DIAGNOSIS — Z79899 Other long term (current) drug therapy: Secondary | ICD-10-CM

## 2017-01-10 NOTE — Telephone Encounter (Signed)
Left message for the patient to remind her to get her TB screening done soon. PA form signed and faxed to [facility].

## 2017-01-12 LAB — QUANTIFERON TB GOLD ASSAY (BLOOD)
INTERFERON GAMMA RELEASE ASSAY: NEGATIVE
Mitogen-Nil: 5.84 IU/mL
QUANTIFERON NIL VALUE: 0.03 [IU]/mL
QUANTIFERON TB AG MINUS NIL: 0 [IU]/mL

## 2017-01-14 ENCOUNTER — Inpatient Hospital Stay (HOSPITAL_COMMUNITY)
Admission: AD | Admit: 2017-01-14 | Discharge: 2017-01-14 | Disposition: A | Discharge status: Home / Self Care | Payer: Medicaid Other | Source: Ambulatory Visit | Attending: Obstetrics and Gynecology | Admitting: Obstetrics and Gynecology

## 2017-01-14 ENCOUNTER — Inpatient Hospital Stay (HOSPITAL_COMMUNITY): Payer: Medicaid Other

## 2017-01-14 ENCOUNTER — Encounter (HOSPITAL_COMMUNITY): Payer: Self-pay | Admitting: *Deleted

## 2017-01-14 DIAGNOSIS — O209 Hemorrhage in early pregnancy, unspecified: Secondary | ICD-10-CM

## 2017-01-14 DIAGNOSIS — Z3A10 10 weeks gestation of pregnancy: Secondary | ICD-10-CM | POA: Diagnosis not present

## 2017-01-14 DIAGNOSIS — O4691 Antepartum hemorrhage, unspecified, first trimester: Secondary | ICD-10-CM | POA: Insufficient documentation

## 2017-01-14 LAB — WET PREP, GENITAL
Clue Cells Wet Prep HPF POC: NONE SEEN
SPERM: NONE SEEN
TRICH WET PREP: NONE SEEN
YEAST WET PREP: NONE SEEN

## 2017-01-14 LAB — URINALYSIS, ROUTINE W REFLEX MICROSCOPIC
Bilirubin Urine: NEGATIVE
Glucose, UA: NEGATIVE mg/dL
HGB URINE DIPSTICK: NEGATIVE
Ketones, ur: NEGATIVE mg/dL
Leukocytes, UA: NEGATIVE
Nitrite: NEGATIVE
PH: 6 (ref 5.0–8.0)
PROTEIN: NEGATIVE mg/dL
Specific Gravity, Urine: 1.019 (ref 1.005–1.030)

## 2017-01-14 LAB — CBC WITH DIFFERENTIAL/PLATELET
BAND NEUTROPHILS: 0 %
BASOS ABS: 0.1 10*3/uL (ref 0.0–0.1)
BASOS PCT: 1 %
BLASTS: 0 %
EOS ABS: 0.1 10*3/uL (ref 0.0–0.7)
EOS PCT: 1 %
HCT: 36.4 % (ref 36.0–46.0)
Hemoglobin: 12.8 g/dL (ref 12.0–15.0)
LYMPHS ABS: 4 10*3/uL (ref 0.7–4.0)
LYMPHS PCT: 33 %
MCH: 31.5 pg (ref 26.0–34.0)
MCHC: 35.2 g/dL (ref 30.0–36.0)
MCV: 89.7 fL (ref 78.0–100.0)
METAMYELOCYTES PCT: 0 %
MONO ABS: 0.5 10*3/uL (ref 0.1–1.0)
MONOS PCT: 4 %
Myelocytes: 0 %
NEUTROS ABS: 7.4 10*3/uL (ref 1.7–7.7)
Neutrophils Relative %: 61 %
OTHER: 0 %
Platelets: 270 10*3/uL (ref 150–400)
Promyelocytes Absolute: 0 %
RBC: 4.06 MIL/uL (ref 3.87–5.11)
RDW: 14.3 % (ref 11.5–15.5)
WBC: 12.1 10*3/uL — ABNORMAL HIGH (ref 4.0–10.5)
nRBC: 0 /100 WBC

## 2017-01-14 LAB — HCG, QUANTITATIVE, PREGNANCY: HCG, BETA CHAIN, QUANT, S: 85980 m[IU]/mL — AB (ref <5)

## 2017-01-14 NOTE — Discharge Instructions (Signed)
Vaginal Bleeding During Pregnancy, First Trimester A small amount of bleeding (spotting) from the vagina is common in early pregnancy. Sometimes the bleeding is normal and is not a problem, and sometimes it is a sign of something serious. Be sure to tell your doctor about any bleeding from your vagina right away. Follow these instructions at home:  Watch your condition for any changes.  Follow your doctor's instructions about how active you can be.  If you are on bed rest:  You may need to stay in bed and only get up to use the bathroom.  You may be allowed to do some activities.  If you need help, make plans for someone to help you.  Write down:  The number of pads you use each day.  How often you change pads.  How soaked (saturated) your pads are.  Do not use tampons.  Do not douche.  Do not have sex or orgasms until your doctor says it is okay.  If you pass any tissue from your vagina, save the tissue so you can show it to your doctor.  Only take medicines as told by your doctor.  Do not take aspirin because it can make you bleed.  Keep all follow-up visits as told by your doctor. Contact a doctor if:  You bleed from your vagina.  You have cramps.  You have labor pains.  You have a fever that does not go away after you take medicine. Get help right away if:  You have very bad cramps in your back or belly (abdomen).  You pass large clots or tissue from your vagina.  You bleed more.  You feel light-headed or weak.  You pass out (faint).  You have chills.  You are leaking fluid or have a gush of fluid from your vagina.  You pass out while pooping (having a bowel movement). This information is not intended to replace advice given to you by your health care provider. Make sure you discuss any questions you have with your health care provider. Document Released: 03/23/2014 Document Revised: 04/13/2016 Document Reviewed: 07/14/2013 Elsevier Interactive  Patient Education  2017 [employer].

## 2017-01-14 NOTE — [facility] Note (Signed)
Pt presents to [facility]U with complaints of vaginal bleeding that she noticed this morning. Denies any pain.

## 2017-01-14 NOTE — MAU Provider Note (Signed)
History     CSN: [id number]  Arrival date and time: 01/14/17 1359   First Provider Initiated Contact with Patient 01/14/17 1425      No chief complaint on file.  HPI [name] is a 31 y.o. [id number] at 54w4dwho presents to MAU today with complaint of vaginal bleeding. The patient states that she noted blood in her pants this morning. She denies continued bleeding. She denies abdominal pain, vaginal discharge or fever. She has not yet been seen for this pregnancy.    OB History    Gravida Para Term Preterm AB Living   5 2 2   2 2    SAB TAB Ectopic Multiple Live Births   1 1     2       Past Medical History:  Diagnosis Date  . Allergy    SEASONAL  . Anemia 2008   Iron deficiency.  s/p transfusions 2009, 2010. Inconsistent compliance with po Iron.   . [place]ative colitis [place]  . Headache(784.0)   . Non compliance w medication regimen 06/2015   stopped Remicade, may not have been taking Azulfidine.    Past Surgical History:  Procedure Laterality Date  . CESAREAN SECTION N/A 12/09/2013   Procedure: Primary Cesarean Section Delivery Baby Girl @ 1959, Apgars 8/8;  Surgeon: KLogan Bores MD;  [facility];  Service: Obstetrics;  Laterality: N/A;  . COLONOSCOPY  01/19/2011   Dr GPenelope Coop no active UC  . COLONOSCOPY W/ BIOPSIES  04/2014   Dr KDeatra Ina Left sided colitis from prox descending to rectum. iNo inflammation at and proximal to splenic flexure.   .Marland KitchenDILATION AND EVACUATION  08/21/2012   Procedure: DILATATION AND EVACUATION;  Surgeon: KLogan Bores MD;  [facility];  Service: Gynecology;  Laterality: N/A;  . TONSILLECTOMY      Family History  Problem Relation Age of Onset  . Miscarriages / [place]   . Hypotension Neg Hx   . Alcohol abuse Neg Hx   . Arthritis Neg Hx   . Asthma Neg Hx   . Birth defects Neg Hx   . COPD Neg Hx   . Depression Neg Hx   . Diabetes Neg Hx   . Drug abuse Neg Hx   . Hearing loss Neg Hx   . Heart  disease Neg Hx   . Hyperlipidemia Neg Hx   . Hypertension Neg Hx   . Kidney disease Neg Hx   . Learning disabilities Neg Hx   . Mental illness Neg Hx   . Mental retardation Neg Hx   . Colon cancer Neg Hx   . Colon polyps Neg Hx   . Esophageal cancer Neg Hx   . Stomach cancer Neg Hx   . Rectal cancer Neg Hx     Social History  Substance Use Topics  . Smoking status: Never Smoker  . Smokeless tobacco: Never Used  . Alcohol use No    Allergies:  Allergies  Allergen Reactions  . Latex Rash  . Remicade [Infliximab] Itching    Prescriptions Prior to Admission  Medication Sig Dispense Refill Last Dose  . Adalimumab (HUMIRA PEN) 40 MG/0.8ML PNKT Inject 40 mg into the skin every 14 (fourteen) days. Pt uses with 286mpen every 14 days.   11/29/2016 at unknown  . Adalimumab (HUMIRA) 20 MG/0.4ML PSKT Inject 20 mg into the skin every 14 (fourteen) days. Pt uses with 4042men every 14 days.   11/29/2016 at unknown  Review of Systems  Constitutional: Negative for fever.  Gastrointestinal: Negative for abdominal pain, constipation, diarrhea, nausea and vomiting.  Genitourinary: Positive for vaginal bleeding. Negative for vaginal discharge.   Physical Exam   Blood pressure 131/75, pulse 93, temperature 98.3 F (36.8 C), resp. rate 18, height 4' 11"  (1.499 m), weight 142 lb (64.4 kg), last menstrual period 11/08/2016.  Physical Exam  Nursing note and vitals reviewed. Constitutional: She is oriented to person, place, and time. She appears well-developed and well-nourished. No distress.  HENT:  Head: Normocephalic and atraumatic.  Cardiovascular: Normal rate.   Respiratory: Effort normal.  GI: Soft. She exhibits no distension and no mass. There is no tenderness. There is no rebound and no guarding.  Genitourinary: Uterus is enlarged (slightly). Uterus is not tender. Cervix exhibits no motion tenderness, no discharge and no friability. Right adnexum displays no mass and no tenderness.  Left adnexum displays no mass and no tenderness. No bleeding in the vagina. Vaginal discharge (small thin, white discharge noted) found.  Neurological: She is alert and oriented to person, place, and time.  Skin: Skin is warm and dry. No erythema.  Psychiatric: She has a normal mood and affect.  Dilation: Closed Effacement (%): Thick Cervical Position: Posterior Exam by:: Hillard Danker, PA-C   Results for orders placed or performed during the hospital encounter of 01/14/17 (from the past 24 hour(s))  Urinalysis, Routine w reflex microscopic     Status: None   Collection Time: 01/14/17  2:00 PM  Result Value Ref Range   Color, Urine YELLOW YELLOW   APPearance CLEAR CLEAR   Specific Gravity, Urine 1.019 1.005 - 1.030   pH 6.0 5.0 - 8.0   Glucose, UA NEGATIVE NEGATIVE mg/dL   Hgb urine dipstick NEGATIVE NEGATIVE   Bilirubin Urine NEGATIVE NEGATIVE   Ketones, ur NEGATIVE NEGATIVE mg/dL   Protein, ur NEGATIVE NEGATIVE mg/dL   Nitrite NEGATIVE NEGATIVE   Leukocytes, UA NEGATIVE NEGATIVE  CBC with Differential/Platelet     Status: Abnormal (Preliminary result)   Collection Time: 01/14/17  2:44 PM  Result Value Ref Range   WBC 12.1 (H) 4.0 - 10.5 K/uL   RBC 4.06 3.87 - 5.11 MIL/uL   Hemoglobin 12.8 12.0 - 15.0 g/dL   HCT 36.4 36.0 - 46.0 %   MCV 89.7 78.0 - 100.0 fL   MCH 31.5 26.0 - 34.0 pg   MCHC 35.2 30.0 - 36.0 g/dL   RDW 14.3 11.5 - 15.5 %   Platelets 270 150 - 400 K/uL   Neutrophils Relative % PENDING %   Neutro Abs PENDING 1.7 - 7.7 K/uL   Band Neutrophils PENDING %   Lymphocytes Relative PENDING %   Lymphs Abs PENDING 0.7 - 4.0 K/uL   Monocytes Relative PENDING %   Monocytes Absolute PENDING 0.1 - 1.0 K/uL   Eosinophils Relative PENDING %   Eosinophils Absolute PENDING 0.0 - 0.7 K/uL   Basophils Relative PENDING %   Basophils Absolute PENDING 0.0 - 0.1 K/uL   WBC Morphology PENDING    RBC Morphology PENDING    Smear Review PENDING    Other PENDING %   nRBC PENDING 0 /100  WBC   Metamyelocytes Relative PENDING %   Myelocytes PENDING %   Promyelocytes Absolute PENDING %   Blasts PENDING %  Wet prep, genital     Status: Abnormal   Collection Time: 01/14/17  2:53 PM  Result Value Ref Range   Yeast Wet Prep HPF POC NONE SEEN NONE SEEN  Trich, Wet Prep NONE SEEN NONE SEEN   Clue Cells Wet Prep HPF POC NONE SEEN NONE SEEN   WBC, Wet Prep HPF POC FEW (A) NONE SEEN   Sperm NONE SEEN    US Ob Comp Less 14 Wks  Result Date: 01/14/2017 CLINICAL DATA:  Patient is pregnant with vaginal spotting this morning. Patient is 9 weeks and 4 days pregnant based on her last menstrual period. EXAM: OBSTETRIC <14 WK ULTRASOUND TECHNIQUE: Transabdominal ultrasound was performed for evaluation of the gestation as well as the maternal uterus and adnexal regions. COMPARISON:  None. FINDINGS: Intrauterine gestational sac: Single Yolk sac:  Visualized. Embryo:  Visualized. Cardiac Activity: Visualized. Heart Rate: 162 bpm CRL:   3.3  cm   10 w 1 d                  [place] EDC: 08/11/2017 Subchorionic hemorrhage:  None visualized. Maternal uterus/adnexae: No uterine masses. Both ovaries are visualized and are within normal limits. No adnexal masses. No pelvic free fluid. IMPRESSION: 1. Single live intrauterine pregnancy with a measured gestational age of [redacted] weeks and 1 day. 2. No evidence of a pregnancy complication. Electronically Signed   By: Lajean Manes M.D.   On: 01/14/2017 15:20    MAU Course  Procedures None  MDM +UPT last month UA,CBC, quant hCG and [place] today to rule out ectopic pregnancy  Assessment and Plan  A: SIUP at 43w4dVaginal bleeding in pregnancy, first trimester  P: Discharge home Bleeding precautions discussed [facility] Patient may return to MAU as needed or if her condition were to change or worsen  JLuvenia Redden PA-C  01/14/2017, 3:24 PM

## 2017-01-16 LAB — OB RESULTS CONSOLE ANTIBODY SCREEN: Antibody Screen: NEGATIVE

## 2017-01-16 LAB — OB RESULTS CONSOLE HIV ANTIBODY (ROUTINE TESTING): HIV: NONREACTIVE

## 2017-01-16 LAB — OB RESULTS CONSOLE ABO/RH: RH Type: POSITIVE

## 2017-01-16 LAB — OB RESULTS CONSOLE GC/CHLAMYDIA
CHLAMYDIA, DNA PROBE: NEGATIVE
GC PROBE AMP, GENITAL: NEGATIVE

## 2017-01-16 LAB — OB RESULTS CONSOLE RUBELLA ANTIBODY, IGM: RUBELLA: IMMUNE

## 2017-01-16 LAB — OB RESULTS CONSOLE RPR: RPR: NONREACTIVE

## 2017-01-16 LAB — OB RESULTS CONSOLE HEPATITIS B SURFACE ANTIGEN: Hepatitis B Surface Ag: NEGATIVE

## 2017-01-22 ENCOUNTER — Telehealth: Payer: Self-pay | Admitting: Gastroenterology

## 2017-01-23 ENCOUNTER — Other Ambulatory Visit: Payer: Self-pay

## 2017-01-23 NOTE — Telephone Encounter (Signed)
40 mg injection sub cutaneous of Humira every 14 days.

## 2017-01-23 NOTE — Telephone Encounter (Signed)
Joyce Solomon with Dr. Gwen Pounds office [phone] ext [phone]. States that patient is told her that she is needing assistance for this med.

## 2017-01-23 NOTE — Telephone Encounter (Signed)
Spoke with patient. Dorris Tracks will have humira approved by tomorrow. [facility] will be able to process the prescription as soon as that is done.

## 2017-01-23 NOTE — Telephone Encounter (Signed)
Please confirm her dose of Humira.

## 2017-01-24 NOTE — Telephone Encounter (Signed)
Humira 40 mg approved by [employer]. Patient and pharmacy notified.

## 2017-03-19 IMAGING — US US OB TRANSVAGINAL
1 series · 14 of 28 positions shown · non-contrast
Comparison: None.

CLINICAL DATA: Pelvic pain.

EXAM:
OBSTETRIC <14 WK US AND TRANSVAGINAL OB US
TECHNIQUE: Both transabdominal and transvaginal ultrasound examinations were
performed for complete evaluation of the gestation as well as the
maternal uterus, adnexal regions, and pelvic cul-de-sac.
Transvaginal technique was performed to assess early pregnancy.

[Series 1: us ob transvaginal · 0.17mm/px · 14 of 73 slices shown]
[im 3/73]
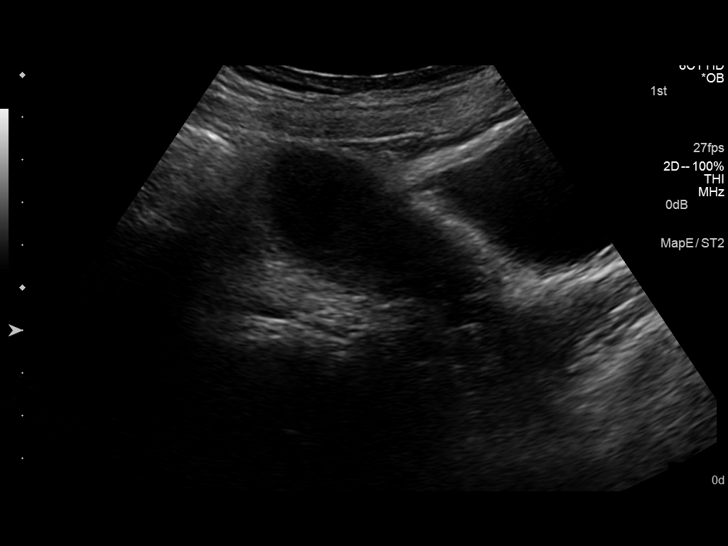
[im 9/73]
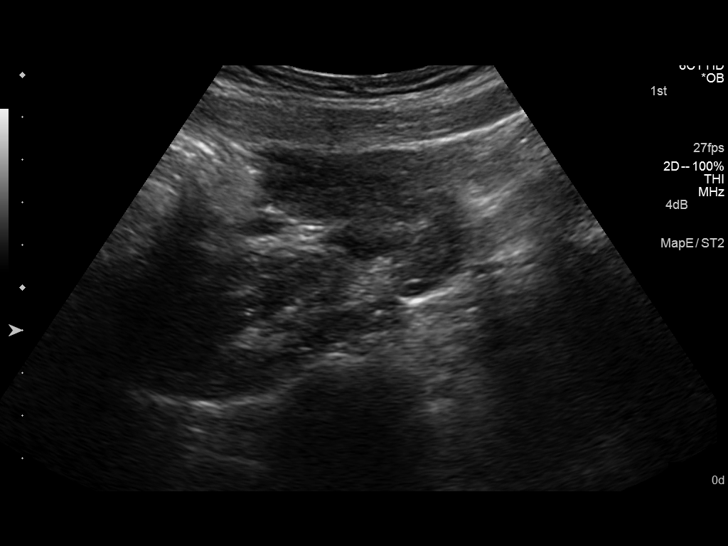
[im 14/73]
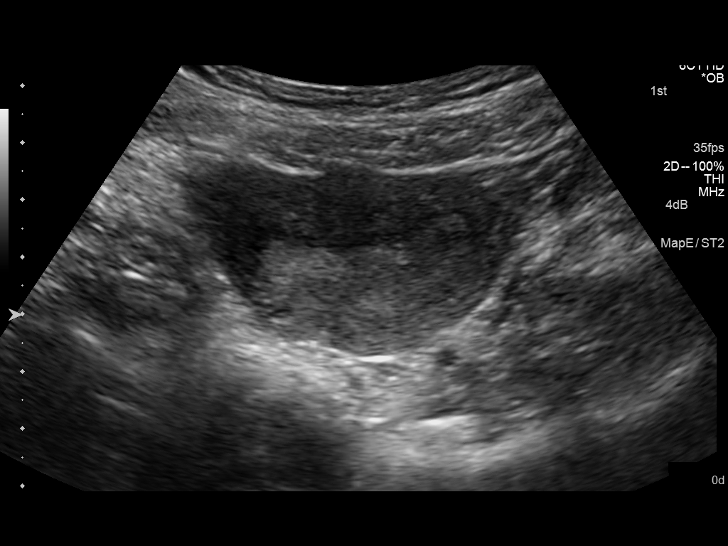
[im 19/73]
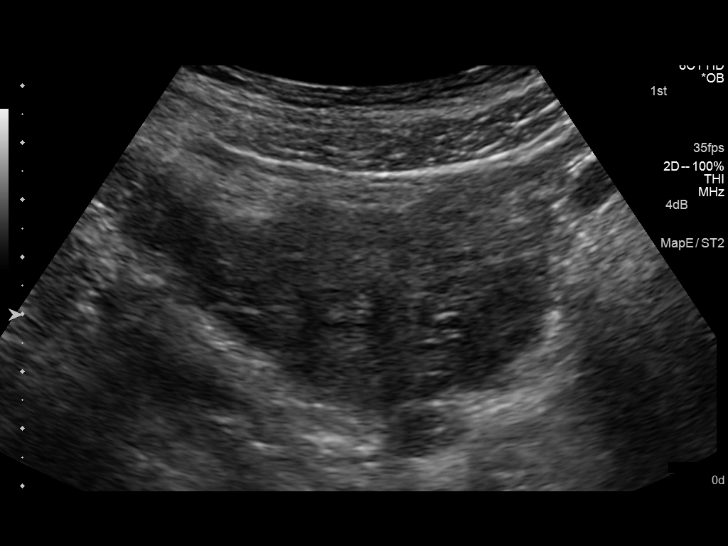
[im 25/73]
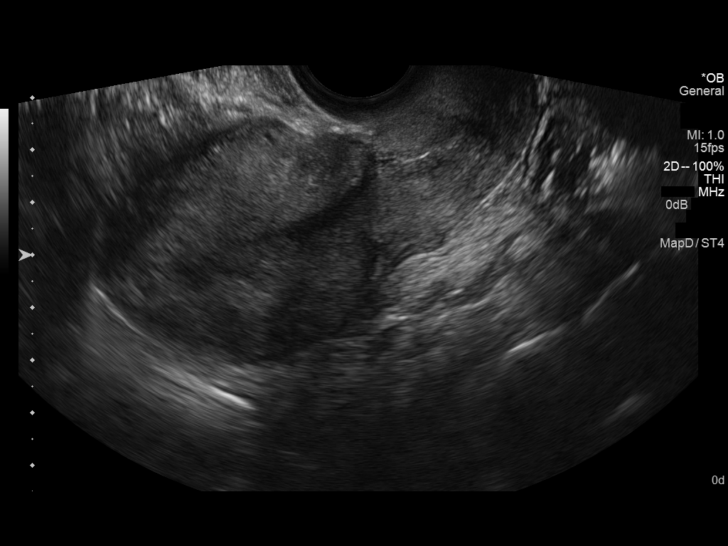
[im 30/73]
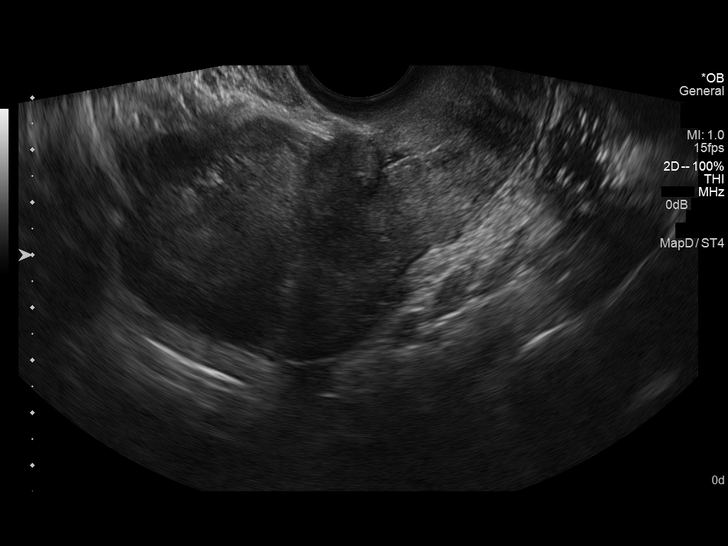
[im 35/73]
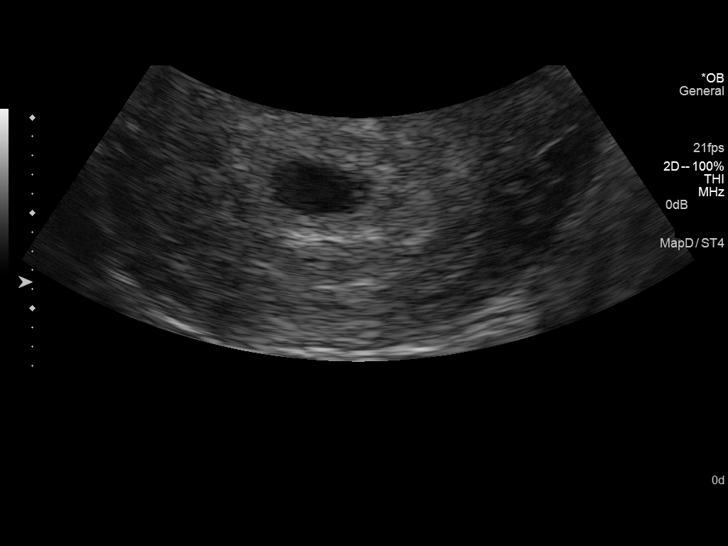
[im 41/73]
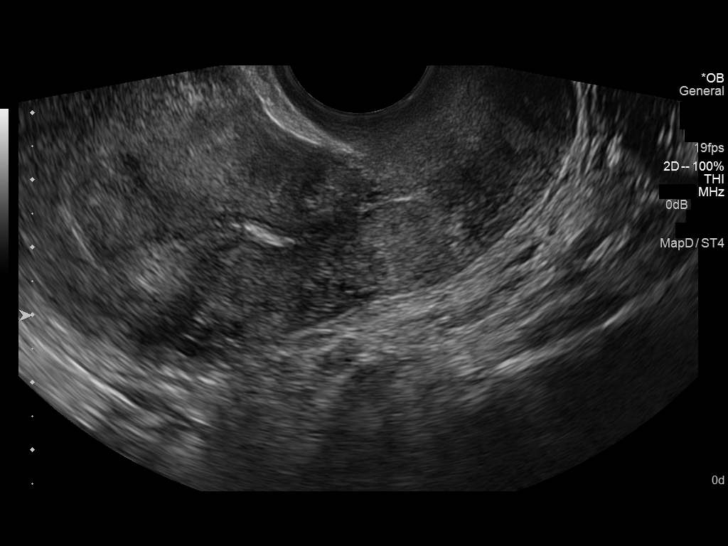
[im 46/73]
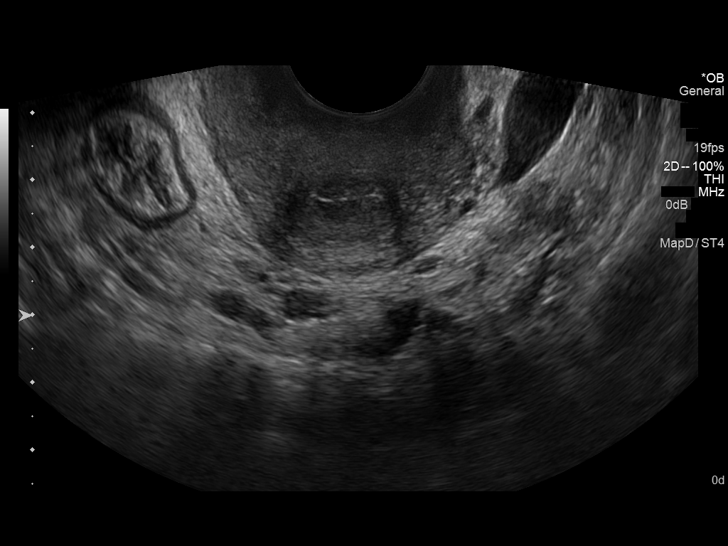
[im 51/73]
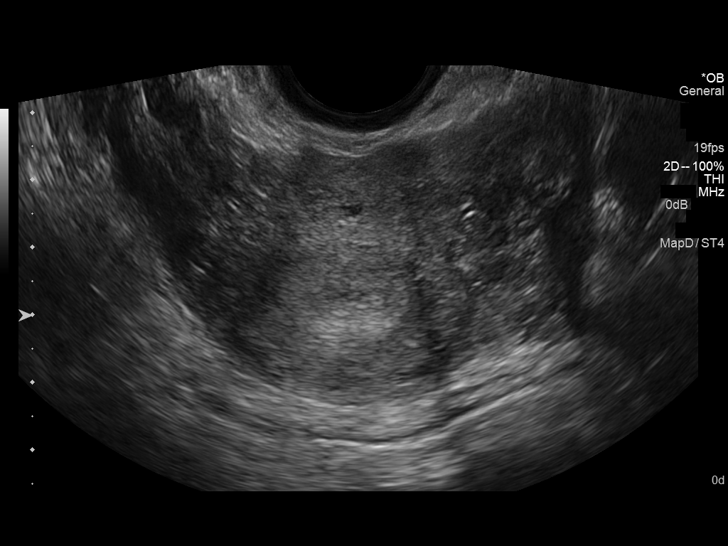
[im 57/73]
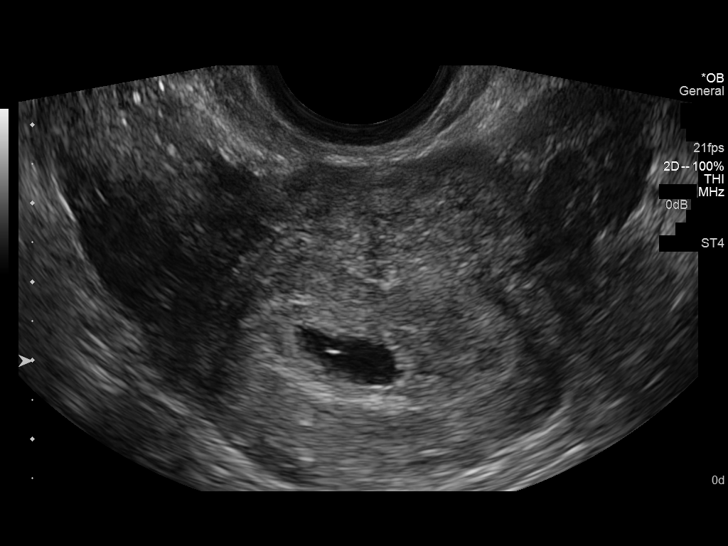
[im 62/73]
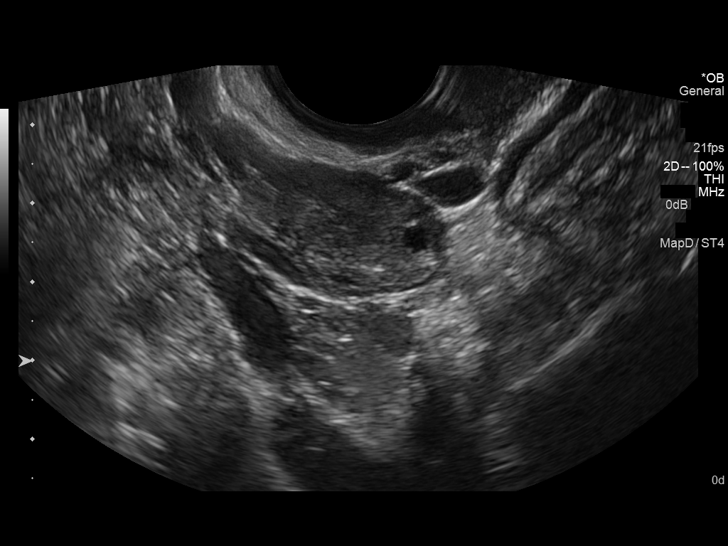
[im 67/73]
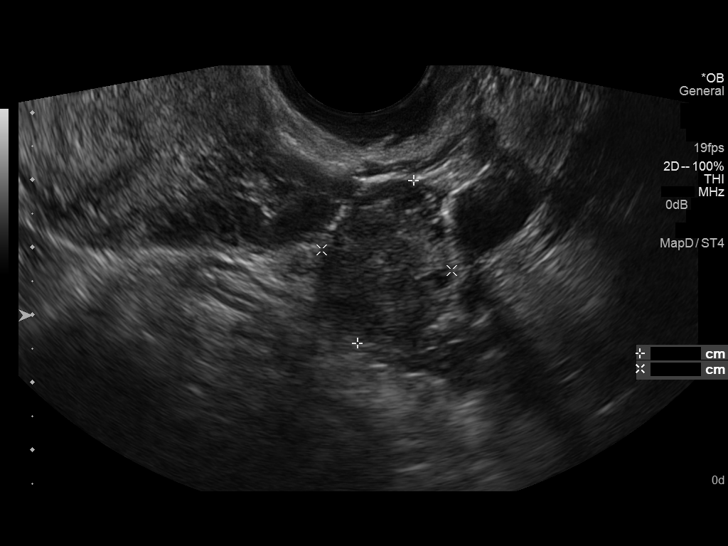
[im 73/73]
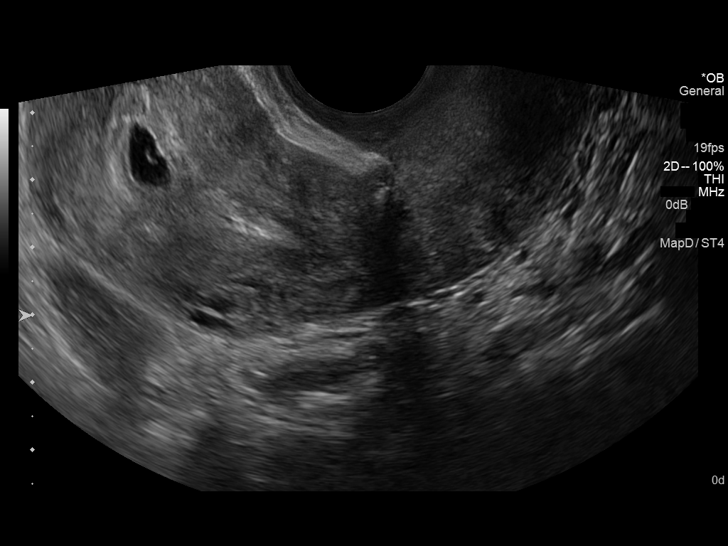

[14 of 28 positions shown; findings below may reference images not displayed]

FINDINGS: Intrauterine gestational sac: Visualized/normal in shape.

Yolk sac:  Present.

Embryo:  Not visualized.

Cardiac Activity: Not visualized.

MSD: 1.0 cm  mm   5 w   5  d
IMPRESSION: Intrauterine gestational sac with yolk sac. Probable early
intrauterine gestational sac, but no fetal pole, or cardiac activity
yet visualized. Recommend follow-up quantitative B-HCG levels and
follow-up US in 14 days to confirm and assess viability. This
recommendation follows SRU consensus guidelines: Diagnostic Criteria
for Nonviable Pregnancy Early in the First Trimester. N Engl J Med

## 2017-03-23 ENCOUNTER — Other Ambulatory Visit: Payer: Self-pay | Admitting: Gastroenterology

## 2017-04-15 ENCOUNTER — Inpatient Hospital Stay (HOSPITAL_COMMUNITY)
Admission: AD | Admit: 2017-04-15 | Discharge: 2017-04-15 | Disposition: A | Discharge status: Home / Self Care | Payer: Medicaid Other | Source: Ambulatory Visit | Attending: Obstetrics and Gynecology | Admitting: Obstetrics and Gynecology

## 2017-04-15 ENCOUNTER — Encounter (HOSPITAL_COMMUNITY): Payer: Self-pay

## 2017-04-15 DIAGNOSIS — J028 Acute pharyngitis due to other specified organisms: Secondary | ICD-10-CM

## 2017-04-15 DIAGNOSIS — O26892 Other specified pregnancy related conditions, second trimester: Secondary | ICD-10-CM | POA: Insufficient documentation

## 2017-04-15 DIAGNOSIS — B9789 Other viral agents as the cause of diseases classified elsewhere: Secondary | ICD-10-CM

## 2017-04-15 DIAGNOSIS — Z3A22 22 weeks gestation of pregnancy: Secondary | ICD-10-CM | POA: Insufficient documentation

## 2017-04-15 DIAGNOSIS — J069 Acute upper respiratory infection, unspecified: Secondary | ICD-10-CM

## 2017-04-15 DIAGNOSIS — Z9889 Other specified postprocedural states: Secondary | ICD-10-CM | POA: Insufficient documentation

## 2017-04-15 DIAGNOSIS — J029 Acute pharyngitis, unspecified: Secondary | ICD-10-CM | POA: Insufficient documentation

## 2017-04-15 LAB — INFLUENZA PANEL BY PCR (TYPE A & B)
Influenza A By PCR: NEGATIVE
Influenza B By PCR: NEGATIVE

## 2017-04-15 LAB — RAPID STREP SCREEN (MED CTR MEBANE ONLY): STREPTOCOCCUS, GROUP A SCREEN (DIRECT): NEGATIVE

## 2017-04-15 MED ORDER — LIDOCAINE VISCOUS 2 % MT SOLN
15.0000 mL | Freq: Once | OROMUCOSAL | Status: DC
  Filled 2017-04-15: qty 15

## 2017-04-15 MED ORDER — PSEUDOEPHEDRINE HCL 60 MG PO TABS: 60.0000 mg | ORAL_TABLET | Freq: Once | ORAL | 0 refills | Status: AC

## 2017-04-15 MED ORDER — ACETAMINOPHEN 500 MG PO TABS: 1000.0000 mg | ORAL_TABLET | Freq: Once | ORAL | Status: DC

## 2017-04-15 MED ORDER — PSEUDOEPHEDRINE HCL 30 MG PO TABS: 60.0000 mg | ORAL_TABLET | Freq: Once | ORAL | Status: DC

## 2017-04-15 NOTE — Progress Notes (Addendum)
G5P2@ 22.[redacted] wksga. Presents to triage for flu symptoms. Denies LOF or bleeding or ctx. Symptoms started with sore throat. States had this past 2 wks. Didn't take anything.   1329: EFM applied.  1350: EFM taken off.   1500: Ordered for flu and Strep throat swab. Provider at bs assessing pt.   1509 Flu and strep done. Sent to lab.  Flu negative.   1615: Discharge instructions given with pt understanding. Pt left unit via ambulatory.

## 2017-04-15 NOTE — MAU Provider Note (Signed)
Chief Complaint: Influenza   None     SUBJECTIVE HPI: Joyce Solomon is a 31 y.o. [id number] at 57w4dby LMP who presents to maternity admissions reporting sore throat x 1 week associated with nasal congestion, cough, and episodes of chills/hot flashes.  She denies known fever. There are no other associated symptoms and no pregnancy symptoms.  She has not tried any treatments or medications because she is not sure what is safe to take in pregnancy.  She denies vaginal bleeding, vaginal itching/burning, urinary symptoms, h/a, dizziness, n/v, or fever/chills.     HPI  Past Medical History:  Diagnosis Date  . Allergy    SEASONAL  . Anemia 2008   Iron deficiency.  s/p transfusions 2009, 2010. Inconsistent compliance with po Iron.   . [place]ative [place] 2007  . Headache(784.0)   . Non compliance w medication regimen 06/2015   stopped Remicade, may not have been taking Azulfidine.   Past Surgical History:  Procedure Laterality Date  . CESAREAN SECTION N/A 12/09/2013   Procedure: Primary Cesarean Section Delivery Baby Girl @ 1959, Apgars 8/8;  Surgeon: KLogan Bores MD;  [facility];  Service: Obstetrics;  Laterality: N/A;  . COLONOSCOPY  01/19/2011   Dr GPenelope Coop no active UC  . COLONOSCOPY W/ BIOPSIES  04/2014   Dr KDeatra Ina Left sided colitis from prox descending to rectum. iNo inflammation at and proximal to splenic flexure.   .Marland KitchenDILATION AND EVACUATION  08/21/2012   Procedure: DILATATION AND EVACUATION;  Surgeon: KLogan Bores MD;  [facility];  Service: Gynecology;  Laterality: N/A;  . TONSILLECTOMY     Social History   Social History  . Marital status: Single    Spouse name: N/A  . Number of children: 2  . Years of education: N/A   Occupational History  . unemployed    Social History Main Topics  . Smoking status: Never Smoker  . Smokeless tobacco: Never Used  . Alcohol use No  . Drug use: No  . Sexual activity: Yes    Birth control/  protection: None   Other Topics Concern  . Not on file   Social History Narrative  . No narrative on file   No current facility-administered medications on file prior to encounter.    No current outpatient prescriptions on file prior to encounter.   Allergies  Allergen Reactions  . Latex Rash  . Remicade [Infliximab] Itching    ROS:  Review of Systems  Constitutional: Positive for chills. Negative for fatigue and fever.  HENT: Positive for sinus pressure and sore throat. Negative for ear pain.   Respiratory: Positive for cough. Negative for shortness of breath.   Cardiovascular: Negative for chest pain.  Genitourinary: Negative for difficulty urinating, dysuria, flank pain, pelvic pain, vaginal bleeding, vaginal discharge and vaginal pain.  Neurological: Negative for dizziness and headaches.  Psychiatric/Behavioral: Negative.      I have reviewed patient's Past Medical Hx, Surgical Hx, Family Hx, Social Hx, medications and allergies.   Physical Exam   Patient Vitals for the past 24 hrs:  BP Temp Temp src Pulse Resp SpO2 Height Weight  04/15/17 1618 114/65 98.1 F (36.7 C) Oral 86 18 100 % - -  04/15/17 1340 - - - - - 98 % - -  04/15/17 1336 - - - - - - 4' 11"  (1.499 m) 150 lb (68 kg)  04/15/17 1335 - - - - - 96 % - -  04/15/17 1330 112/67  98.5 F (36.9 C) Oral 100 - 98 % - -   Constitutional: Well-developed, well-nourished female in no acute distress.  HEENT:  Mouth/throat: mucous membranes moist, pharynx normal without lesions and tonsils with mild erythema, no edema, no exudate Cardiovascular: normal rate Respiratory: normal effort GI: Abd soft, non-tender. Pos BS x 4 MS: Extremities nontender, no edema, normal ROM Neurologic: Alert and oriented x 4.  GU: Neg CVAT.   FHT 140 by doppler  LAB RESULTS Results for orders placed or performed during the hospital encounter of 04/15/17 (from the past 24 hour(s))  Influenza panel by PCR (type A & B)     Status:  None   Collection Time: 04/15/17  3:06 PM  Result Value Ref Range   Influenza A By PCR NEGATIVE NEGATIVE   Influenza B By PCR NEGATIVE NEGATIVE  Rapid strep screen (not at [facility])     Status: None   Collection Time: 04/15/17  3:06 PM  Result Value Ref Range   Streptococcus, Group A Screen (Direct) NEGATIVE NEGATIVE       IMAGING No results found.  MAU Management/MDM: Ordered labs and reviewed results.  Pt treated for upper respiratory infection with Sudafed and Tylenol while in MAU with improved symptoms.  Swabs for flu and strep are negative.  List of safe OTC meds in pregnancy given. Pt to f/u with [facility] as scheduled, return to MAU as needed for emergencies. Consult Dr Willis Modena with assessment and findings. Pt stable at time of discharge.  ASSESSMENT 1. Viral upper respiratory tract infection   2. Sore throat (viral)   3. [redacted] weeks gestation of pregnancy     PLAN Discharge home Allergies as of 04/15/2017      Reactions   Latex Rash   Remicade [infliximab] Itching      Medication List    STOP taking these medications   HUMIRA PEN 40 MG/0.8ML Pnkt Generic drug:  Adalimumab     TAKE these medications   Prenatal Vitamin 27-0.8 MG Tabs TAKE 1 TABLET BY MOUTH DAILY   pseudoephedrine 60 MG tablet Commonly known as:  SUDAFED Take 1 tablet (60 mg total) by mouth once.        Fatima Blank Certified Nurse-Midwife 04/15/2017  6:18 PM

## 2017-04-18 LAB — CULTURE, GROUP A STREP (THRC)

## 2017-07-10 ENCOUNTER — Encounter (HOSPITAL_COMMUNITY): Payer: Self-pay | Admitting: Emergency Medicine

## 2017-07-10 ENCOUNTER — Ambulatory Visit (HOSPITAL_COMMUNITY)
Admission: EM | Admit: 2017-07-10 | Discharge: 2017-07-10 | Disposition: A | Discharge status: Home / Self Care | Payer: Medicaid Other | Attending: Family Medicine | Admitting: Family Medicine

## 2017-07-10 DIAGNOSIS — L02411 Cutaneous abscess of right axilla: Secondary | ICD-10-CM

## 2017-07-10 MED ORDER — LIDOCAINE-EPINEPHRINE (PF) 2 %-1:200000 IJ SOLN
INTRAMUSCULAR | Status: AC
  Filled 2017-07-10: qty 20

## 2017-07-10 MED ORDER — CEPHALEXIN 500 MG PO CAPS: 500.0000 mg | ORAL_CAPSULE | Freq: Four times a day (QID) | ORAL | 0 refills | Status: DC

## 2017-07-10 NOTE — ED Triage Notes (Signed)
PT has an abscess under left arm for 2-3 weeks. PT is [redacted] weeks pregnant.

## 2017-07-10 NOTE — Discharge Instructions (Signed)
3 abscesses drained today. Start Keflex as directed. Follow-up with OB/GYN as scheduled. Monitor for any worsening of symptoms, fever, spreading redness, increased warmth, increased pain, follow-up for reevaluation. Follow up with PCP if symptoms reoccur for options of treatment and prevention.

## 2017-07-10 NOTE — ED Provider Notes (Signed)
[place]    CSN: [id number] Arrival date & time: 07/10/17  1421     History   Chief Complaint Chief Complaint  Patient presents with  . Abscess    HPI Joyce Solomon is a 31 y.o. female.   31 year old female who is [redacted] weeks pregnant comes in for multiple right axilla abscesses. She has noticed an past 2-3 weeks, and is experiencing pain on one or 2 of them. Denies fever, chills, night sweats. Denies spreading erythema, increased warmth, discharge. She noticed she states she has had recurrent abscesses in the past, most noticeable after shaving. Patient is scheduled to see her OB/GYN tomorrow.      Past Medical History:  Diagnosis Date  . Allergy    SEASONAL  . Anemia 2008   Iron deficiency.  s/p transfusions 2009, 2010. Inconsistent compliance with po Iron.   . Chronic ulcerative colitis ([place]) 2007  . Headache(784.0)   . Non compliance w medication regimen 06/2015   stopped Remicade, may not have been taking Azulfidine.    Patient Active Problem List   Diagnosis Date Noted  . Rectal bleeding 04/23/2014  . Status post C-section 12/10/2013  . IUGR (intrauterine growth restriction) 12/09/2013  . DENTAL CARIES 12/22/2009  . HYPERTHYROIDISM 08/03/2008  . CONSTIPATION 07/29/2008  . ACNE, MILD 07/29/2008  . ANEMIA 06/30/2008  . Ulcerative colitis ([place]) 11/20/2005    Past Surgical History:  Procedure Laterality Date  . CESAREAN SECTION N/A 12/09/2013   Procedure: Primary Cesarean Section Delivery Baby Girl @ 1959, Apgars 8/8;  Surgeon: Logan Bores, MD;  Location: [facility];  Service: Obstetrics;  Laterality: N/A;  . COLONOSCOPY  01/19/2011   Dr Penelope Coop. no active UC  . COLONOSCOPY W/ BIOPSIES  04/2014   Dr Deatra Ina. Left sided colitis from prox descending to rectum. iNo inflammation at and proximal to splenic flexure.   [name] DILATION AND EVACUATION  08/21/2012   Procedure: DILATATION AND EVACUATION;  Surgeon: Logan Bores, MD;  Location: [facility];   Service: Gynecology;  Laterality: N/A;  . TONSILLECTOMY      OB History    Gravida Para Term Preterm AB Living   5 2 2   2 2    SAB TAB Ectopic Multiple Live Births   1 1     2        Home Medications    Prior to Admission medications   Medication Sig Start Date End Date Taking? Authorizing Provider  Prenatal Vit-Fe Fumarate-FA (PRENATAL VITAMIN) 27-0.8 MG TABS TAKE 1 TABLET BY MOUTH DAILY   Yes [provider]  cephALEXin (KEFLEX) 500 MG capsule Take 1 capsule (500 mg total) by mouth 4 (four) times daily. 07/10/17   Ok Edwards, PA-C    Family History Family History  Problem Relation Age of Onset  . Miscarriages / Stillbirths Unknown   . Hypotension Neg Hx   . Alcohol abuse Neg Hx   . Arthritis Neg Hx   . Asthma Neg Hx   . Birth defects Neg Hx   . COPD Neg Hx   . Depression Neg Hx   . Diabetes Neg Hx   . Drug abuse Neg Hx   . Hearing loss Neg Hx   . Heart disease Neg Hx   . Hyperlipidemia Neg Hx   . Hypertension Neg Hx   . Kidney disease Neg Hx   . Learning disabilities Neg Hx   . Mental illness Neg Hx   . Mental retardation Neg Hx   .  Colon cancer Neg Hx   . Colon polyps Neg Hx   . Esophageal cancer Neg Hx   . Stomach cancer Neg Hx   . Rectal cancer Neg Hx     Social History Social History  Substance Use Topics  . Smoking status: Never Smoker  . Smokeless tobacco: Never Used  . Alcohol use No     Allergies   Latex and Remicade [infliximab]   Review of Systems Review of Systems  Reason unable to perform ROS: See HPI as above.     Physical Exam Triage Vital Signs ED Triage Vitals  Enc Vitals Group     BP 07/10/17 1501 116/74     Pulse Rate 07/10/17 1501 94     Resp 07/10/17 1501 16     Temp 07/10/17 1501 98.3 F (36.8 C)     Temp Source 07/10/17 1501 Oral     SpO2 07/10/17 1501 100 %     Weight 07/10/17 1502 150 lb (68 kg)     Height 07/10/17 1502 5' (1.524 m)     Head Circumference --      Peak Flow --      Pain Score 07/10/17  1502 5     Pain Loc --      Pain Edu? --      Excl. in [place]? --    No data found.   Updated Vital Signs BP 116/74   Pulse 94   Temp 98.3 F (36.8 C) (Oral)   Resp 16   Ht 5' (1.524 m)   Wt 150 lb (68 kg)   LMP 11/08/2016   SpO2 100%   BMI 29.29 kg/m    Physical Exam  Constitutional: She is oriented to person, place, and time. She appears well-developed and well-nourished. No distress.  Neurological: She is alert and oriented to person, place, and time.  Skin:  Multiple lesions palpated on right axilla, ranging from 0.5cm-2cm. Possible deep lesions palpated. Painful palpation of 2 lesions. Fluctuations felt.      UC Treatments / Results  Labs (all labs ordered are listed, but only abnormal results are displayed) Labs Reviewed - No data to display  EKG  EKG Interpretation None       Radiology No results found.  Procedures .Marland KitchenIncision and Drainage Date/Time: 07/10/2017 4:24 PM Performed by: Tasia Catchings, Nina Hoar V Authorized by: Vanessa Kick   Consent:    Consent obtained:  Verbal   Consent given by:  Patient   Risks discussed:  Bleeding, incomplete drainage, pain and infection   Alternatives discussed:  Alternative treatment Location:    Type:  Abscess   Size:  1cm x 1cm; 0.5 cm x 0.5 cm; 1cm x 1 cm   Location:  Upper extremity   Upper extremity location: left axilla. Pre-procedure details:    Skin preparation:  Betadine Anesthesia (see MAR for exact dosages):    Anesthesia method:  Local infiltration   Local anesthetic:  Lidocaine 2% WITH epi Procedure type:    Complexity:  Simple Procedure details:    Needle aspiration: no     Incision types:  Stab incision   Incision depth:  Dermal   Scalpel blade:  11   Wound management:  Debrided   Drainage:  Bloody and purulent   Drainage amount:  Moderate   Wound treatment:  Wound left open   Packing materials:  None Post-procedure details:    Patient tolerance of procedure:  Tolerated well, no immediate  complications    (including critical  care time)  Medications Ordered in UC Medications - No data to display   Initial Impression / Assessment and Plan / UC Course  I have reviewed the triage vital signs and the nursing notes.  Pertinent labs & imaging results that were available during my care of the patient were reviewed by me and considered in my medical decision making (see chart for details).    Patient tolerated I&D well. Given possible other abscesses, start Keflex as directed. Follow-up with OB/GYN as directed. Return precautions given.  Final Clinical Impressions(s) / UC Diagnoses   Final diagnoses:  Abscess of axilla, right    New Prescriptions New Prescriptions   CEPHALEXIN (KEFLEX) 500 MG CAPSULE    Take 1 capsule (500 mg total) by mouth 4 (four) times daily.       Ok Edwards, PA-C 07/10/17 1656

## 2017-07-12 LAB — OB RESULTS CONSOLE GBS: STREP GROUP B AG: POSITIVE

## 2017-07-25 ENCOUNTER — Other Ambulatory Visit: Payer: Self-pay | Admitting: Gastroenterology

## 2017-07-26 ENCOUNTER — Other Ambulatory Visit: Payer: Self-pay

## 2017-08-03 ENCOUNTER — Encounter (HOSPITAL_COMMUNITY): Payer: Self-pay | Admitting: *Deleted

## 2017-08-03 ENCOUNTER — Telehealth (HOSPITAL_COMMUNITY): Payer: Self-pay | Admitting: *Deleted

## 2017-08-03 NOTE — Telephone Encounter (Signed)
Preadmission screen  

## 2017-08-04 ENCOUNTER — Inpatient Hospital Stay (HOSPITAL_COMMUNITY)
Admission: AD | Admit: 2017-08-04 | Discharge: 2017-08-05 | Disposition: A | Discharge status: Home / Self Care | Payer: Medicaid Other | Source: Ambulatory Visit | Attending: Obstetrics and Gynecology | Admitting: Obstetrics and Gynecology

## 2017-08-04 ENCOUNTER — Encounter (HOSPITAL_COMMUNITY): Payer: Self-pay | Admitting: *Deleted

## 2017-08-04 DIAGNOSIS — O471 False labor at or after 37 completed weeks of gestation: Secondary | ICD-10-CM

## 2017-08-04 DIAGNOSIS — Z3A39 39 weeks gestation of pregnancy: Secondary | ICD-10-CM | POA: Insufficient documentation

## 2017-08-04 NOTE — MAU Note (Signed)
Having ctxs yesterday and today. Denies LOF or bleeding. 1.5cm and 50% last sve

## 2017-08-05 DIAGNOSIS — Z3A39 39 weeks gestation of pregnancy: Secondary | ICD-10-CM | POA: Diagnosis not present

## 2017-08-05 NOTE — Discharge Instructions (Signed)

## 2017-08-05 NOTE — Progress Notes (Signed)
Written and verbal d/c instructions given and understanding voiced. 

## 2017-08-05 NOTE — MAU Note (Signed)
I have communicated with Dr Willis Modena and reviewed vital signs:  Vitals:   08/04/17 2254 08/05/17 0002  BP: 123/70 (!) 107/55  Pulse: (!) 102 90  Resp: 18 18  Temp: 98.2 F (36.8 C) 98.3 F (36.8 C)    Vaginal exam:  Dilation: 1 Effacement (%): Thick Cervical Position: Posterior Presentation: Undeterminable (pp high) Exam by:: Blima Singer RNC,   Also reviewed contraction pattern and that non-stress test is reactive.  It has been documented that patient is contracting irregularly with u/i with one ctx lasting 28mns with variable.  Patient denies any other complaints.  Based on this report provider has given order for discharge.  A discharge order and diagnosis entered by a provider.   Labor discharge instructions reviewed with patient.

## 2017-08-06 ENCOUNTER — Encounter (HOSPITAL_COMMUNITY): Payer: Self-pay | Admitting: *Deleted

## 2017-08-13 NOTE — H&P (Signed)
Joyce Solomon is a 31 y.o. female [id number] at 29 6/7 weeks (EDD 08/15/17 by LMP c/w 10 week [place])  presenting for IOL at term with favorable cervix.   PRenatal care complicated by prior LTCS for fetal bradycardia last pregnancy with prior successful NSVD.  Both of those babies were IUGR, this baby appears normal in size.  She has a history of ulcerative colitis, stable off medications since about [redacted] weeks gestation.  She is also GBS positive.    OB History    Gravida Para Term Preterm AB Living   5 2 2   2 2    SAB TAB Ectopic Multiple Live Births   1 1     2     2012 NSVD 5#7oz 2015 LTCS 5#14oz SAB X 2  Past Medical History:  Diagnosis Date  . Allergy    SEASONAL  . Anemia 2008   Iron deficiency.  s/p transfusions 2009, 2010. Inconsistent compliance with po Iron.   . Chronic ulcerative colitis ([place]) 2007  . Headache(784.0)   . Non compliance w medication regimen 06/2015   stopped Remicade, may not have been taking Azulfidine.   Past Surgical History:  Procedure Laterality Date  . CESAREAN SECTION N/A 12/09/2013   Procedure: Primary Cesarean Section Delivery Baby Girl @ 1959, Apgars 8/8;  Surgeon: Logan Bores, MD;  Location: [facility];  Service: Obstetrics;  Laterality: N/A;  . COLONOSCOPY  01/19/2011   Dr Penelope Coop. no active UC  . COLONOSCOPY W/ BIOPSIES  04/2014   Dr Deatra Ina. Left sided colitis from prox descending to rectum. iNo inflammation at and proximal to splenic flexure.   [name] DILATION AND EVACUATION  08/21/2012   Procedure: DILATATION AND EVACUATION;  Surgeon: Logan Bores, MD;  Location: [facility];  Service: Gynecology;  Laterality: N/A;  . TONSILLECTOMY     Family History: family history includes Miscarriages / Stillbirths in her unknown relative. Social History:  reports that she has never smoked. She has never used smokeless tobacco. She reports that she does not drink alcohol or use drugs.     Maternal Diabetes: No Genetic Screening: Normal Maternal  Ultrasounds/Referrals: Normal Fetal Ultrasounds or other Referrals:  None Maternal Substance Abuse:  No Significant Maternal Medications:  None Significant Maternal Lab Results:  Lab values include: Group B Strep positive Other Comments:  None  Review of Systems  Gastrointestinal: Negative for abdominal pain, constipation and diarrhea.   Maternal Medical History:  Contractions: Frequency: irregular.   Perceived severity is mild.    Fetal activity: Perceived fetal activity is normal.    Prenatal Complications - Diabetes: none.      Last menstrual period 11/08/2016. Exam Physical Exam  Constitutional: She appears well-developed.  HENT:  Head: Normocephalic.  Cardiovascular: Normal rate and regular rhythm.   GI: Soft.  Pfannenstiel incision  Genitourinary: Vagina normal.  Musculoskeletal: Normal range of motion.  Neurological: She is alert.  Psychiatric: She has a normal mood and affect.    Prenatal labs: ABO, Rh: B/Positive/-- (02/27 0000) Antibody: Negative (02/27 0000) Rubella: Immune (02/27 0000) RPR: Nonreactive (02/27 0000)  HBsAg: Negative (02/27 0000)  HIV: Non-reactive (02/27 0000)  GBS: Positive (08/23 0000)  First trimester screen WNL  AFP negative One hour GCT 179 76/132/113/96 HgbAA  Assessment/Plan: Pt for TOLAC and has been  Counseled on risks and benefits in depth.  PCN for +GBS and then pitocin and AROM.  Plans epidural  Logan Bores 08/13/2017, 5:51 PM

## 2017-08-14 ENCOUNTER — Inpatient Hospital Stay (HOSPITAL_COMMUNITY)
Admission: RE | Admit: 2017-08-14 | Discharge: 2017-08-16 | DRG: 766 | Disposition: A | Discharge status: Home / Self Care | Payer: Medicaid Other | Source: Ambulatory Visit | Attending: Obstetrics and Gynecology | Admitting: Obstetrics and Gynecology

## 2017-08-14 ENCOUNTER — Inpatient Hospital Stay (HOSPITAL_COMMUNITY): Payer: Medicaid Other | Admitting: Anesthesiology

## 2017-08-14 ENCOUNTER — Encounter (HOSPITAL_COMMUNITY): Payer: Self-pay

## 2017-08-14 ENCOUNTER — Encounter (HOSPITAL_COMMUNITY)
Admission: RE | Disposition: A | Discharge status: Home / Self Care | Payer: Self-pay | Source: Ambulatory Visit | Attending: Obstetrics and Gynecology

## 2017-08-14 VITALS — BP 111/50 | HR 73 | Temp 98.2°F | Resp 16 | Ht 59.0 in | Wt 153.0 lb

## 2017-08-14 DIAGNOSIS — O26893 Other specified pregnancy related conditions, third trimester: Secondary | ICD-10-CM | POA: Diagnosis present

## 2017-08-14 DIAGNOSIS — O34211 Maternal care for low transverse scar from previous cesarean delivery: Secondary | ICD-10-CM | POA: Diagnosis present

## 2017-08-14 DIAGNOSIS — Z87891 Personal history of nicotine dependence: Secondary | ICD-10-CM | POA: Diagnosis not present

## 2017-08-14 DIAGNOSIS — O99824 Streptococcus B carrier state complicating childbirth: Secondary | ICD-10-CM | POA: Diagnosis present

## 2017-08-14 DIAGNOSIS — Z3490 Encounter for supervision of normal pregnancy, unspecified, unspecified trimester: Secondary | ICD-10-CM

## 2017-08-14 DIAGNOSIS — Z3A39 39 weeks gestation of pregnancy: Secondary | ICD-10-CM

## 2017-08-14 DIAGNOSIS — Z98891 History of uterine scar from previous surgery: Secondary | ICD-10-CM

## 2017-08-14 LAB — CBC
HCT: 36.1 % (ref 36.0–46.0)
HEMOGLOBIN: 12.2 g/dL (ref 12.0–15.0)
MCH: 28.6 pg (ref 26.0–34.0)
MCHC: 33.8 g/dL (ref 30.0–36.0)
MCV: 84.7 fL (ref 78.0–100.0)
Platelets: 218 10*3/uL (ref 150–400)
RBC: 4.26 MIL/uL (ref 3.87–5.11)
RDW: 15.5 % (ref 11.5–15.5)
WBC: 10.7 10*3/uL — ABNORMAL HIGH (ref 4.0–10.5)

## 2017-08-14 LAB — TYPE AND SCREEN
ABO/RH(D): B POS
Antibody Screen: NEGATIVE

## 2017-08-14 LAB — RPR: RPR: NONREACTIVE

## 2017-08-14 SURGERY — Surgical Case
Anesthesia: Regional

## 2017-08-14 MED ORDER — MEPERIDINE HCL 25 MG/ML IJ SOLN: 6.2500 mg | INTRAMUSCULAR | Status: DC | PRN

## 2017-08-14 MED ORDER — MEPERIDINE HCL 25 MG/ML IJ SOLN
INTRAMUSCULAR | Status: AC
  Filled 2017-08-14: qty 1

## 2017-08-14 MED ORDER — PHENYLEPHRINE 40 MCG/ML (10ML) SYRINGE FOR IV PUSH (FOR BLOOD PRESSURE SUPPORT): 80.0000 ug | PREFILLED_SYRINGE | INTRAVENOUS | Status: DC | PRN

## 2017-08-14 MED ORDER — SCOPOLAMINE 1 MG/3DAYS TD PT72
MEDICATED_PATCH | TRANSDERMAL | Status: DC | PRN
  Administered 2017-08-14: 1 via TRANSDERMAL

## 2017-08-14 MED ORDER — FENTANYL CITRATE (PF) 100 MCG/2ML IJ SOLN
INTRAMUSCULAR | Status: DC | PRN
  Administered 2017-08-14: 50 ug via INTRAVENOUS

## 2017-08-14 MED ORDER — OXYTOCIN 10 UNIT/ML IJ SOLN
INTRAMUSCULAR | Status: AC
  Filled 2017-08-14: qty 4

## 2017-08-14 MED ORDER — EPHEDRINE 5 MG/ML INJ: 10.0000 mg | INTRAVENOUS | Status: DC | PRN

## 2017-08-14 MED ORDER — ONDANSETRON HCL 4 MG/2ML IJ SOLN: 4.0000 mg | Freq: Four times a day (QID) | INTRAMUSCULAR | Status: DC | PRN

## 2017-08-14 MED ORDER — LACTATED RINGERS IV SOLN
INTRAVENOUS | Status: DC
  Administered 2017-08-14: 08:00:00 via INTRAVENOUS

## 2017-08-14 MED ORDER — DIPHENHYDRAMINE HCL 50 MG/ML IJ SOLN: 12.5000 mg | INTRAMUSCULAR | Status: DC | PRN

## 2017-08-14 MED ORDER — MORPHINE SULFATE (PF) 0.5 MG/ML IJ SOLN
INTRAMUSCULAR | Status: DC | PRN
  Administered 2017-08-14: 2 mg via INTRAVENOUS
  Administered 2017-08-14: 3 mg via EPIDURAL

## 2017-08-14 MED ORDER — TERBUTALINE SULFATE 1 MG/ML IJ SOLN: 0.2500 mg | Freq: Once | INTRAMUSCULAR | Status: DC | PRN

## 2017-08-14 MED ORDER — SOD CITRATE-CITRIC ACID 500-334 MG/5ML PO SOLN
30.0000 mL | ORAL | Status: DC | PRN
  Administered 2017-08-14: 30 mL via ORAL
  Filled 2017-08-14: qty 15

## 2017-08-14 MED ORDER — MEPERIDINE HCL 25 MG/ML IJ SOLN
INTRAMUSCULAR | Status: DC | PRN
  Administered 2017-08-14 (×2): 12.5 mg via INTRAVENOUS

## 2017-08-14 MED ORDER — LACTATED RINGERS IV SOLN
500.0000 mL | INTRAVENOUS | Status: DC | PRN
  Administered 2017-08-14: 300 mL via INTRAVENOUS
  Administered 2017-08-14: 1000 mL via INTRAVENOUS

## 2017-08-14 MED ORDER — LACTATED RINGERS IV SOLN
INTRAVENOUS | Status: DC | PRN
  Administered 2017-08-14 (×2): via INTRAVENOUS

## 2017-08-14 MED ORDER — FENTANYL CITRATE (PF) 100 MCG/2ML IJ SOLN
INTRAMUSCULAR | Status: AC
  Filled 2017-08-14: qty 2

## 2017-08-14 MED ORDER — CEFAZOLIN SODIUM-DEXTROSE 2-4 GM/100ML-% IV SOLN: 2.0000 g | Freq: Once | INTRAVENOUS | Status: DC

## 2017-08-14 MED ORDER — OXYCODONE-ACETAMINOPHEN 5-325 MG PO TABS: 1.0000 | ORAL_TABLET | ORAL | Status: DC | PRN

## 2017-08-14 MED ORDER — OXYTOCIN BOLUS FROM INFUSION: 500.0000 mL | Freq: Once | INTRAVENOUS | Status: DC

## 2017-08-14 MED ORDER — ACETAMINOPHEN 325 MG PO TABS: 650.0000 mg | ORAL_TABLET | ORAL | Status: DC | PRN

## 2017-08-14 MED ORDER — DEXAMETHASONE SODIUM PHOSPHATE 10 MG/ML IJ SOLN
INTRAMUSCULAR | Status: DC | PRN
  Administered 2017-08-14: 10 mg via INTRAVENOUS

## 2017-08-14 MED ORDER — SODIUM BICARBONATE 8.4 % IV SOLN
INTRAVENOUS | Status: DC | PRN
  Administered 2017-08-14: 10 mL via EPIDURAL

## 2017-08-14 MED ORDER — SODIUM BICARBONATE 8.4 % IV SOLN
INTRAVENOUS | Status: AC
  Filled 2017-08-14: qty 50

## 2017-08-14 MED ORDER — METOCLOPRAMIDE HCL 5 MG/ML IJ SOLN: 10.0000 mg | Freq: Once | INTRAMUSCULAR | Status: DC | PRN

## 2017-08-14 MED ORDER — FENTANYL CITRATE (PF) 100 MCG/2ML IJ SOLN
25.0000 ug | INTRAMUSCULAR | Status: DC | PRN
Start: 2017-08-14 — End: 2017-08-15

## 2017-08-14 MED ORDER — KETOROLAC TROMETHAMINE 30 MG/ML IJ SOLN
30.0000 mg | Freq: Four times a day (QID) | INTRAMUSCULAR | Status: AC | PRN
  Administered 2017-08-15: 30 mg via INTRAMUSCULAR

## 2017-08-14 MED ORDER — LIDOCAINE HCL (PF) 1 % IJ SOLN: 30.0000 mL | INTRAMUSCULAR | Status: DC | PRN

## 2017-08-14 MED ORDER — EPHEDRINE 5 MG/ML INJ
10.0000 mg | INTRAVENOUS | Status: AC | PRN
  Administered 2017-08-14 (×2): 10 mg via INTRAVENOUS
  Filled 2017-08-14 (×2): qty 4

## 2017-08-14 MED ORDER — OXYCODONE-ACETAMINOPHEN 5-325 MG PO TABS: 2.0000 | ORAL_TABLET | ORAL | Status: DC | PRN

## 2017-08-14 MED ORDER — LACTATED RINGERS IV SOLN: INTRAVENOUS | Status: DC

## 2017-08-14 MED ORDER — MORPHINE SULFATE (PF) 0.5 MG/ML IJ SOLN
INTRAMUSCULAR | Status: AC
  Filled 2017-08-14: qty 10

## 2017-08-14 MED ORDER — LACTATED RINGERS IV SOLN
INTRAVENOUS | Status: DC | PRN
  Administered 2017-08-14: 23:00:00 via INTRAVENOUS

## 2017-08-14 MED ORDER — PENICILLIN G POTASSIUM 5000000 UNITS IJ SOLR
5.0000 10*6.[IU] | Freq: Once | INTRAMUSCULAR | Status: AC
  Administered 2017-08-14: 5 10*6.[IU] via INTRAVENOUS
  Filled 2017-08-14: qty 5

## 2017-08-14 MED ORDER — LIDOCAINE HCL (PF) 1 % IJ SOLN
INTRAMUSCULAR | Status: DC | PRN
  Administered 2017-08-14 (×2): 5 mL via EPIDURAL

## 2017-08-14 MED ORDER — LACTATED RINGERS IV SOLN
500.0000 mL | Freq: Once | INTRAVENOUS | Status: AC
  Administered 2017-08-14: 500 mL via INTRAVENOUS

## 2017-08-14 MED ORDER — OXYTOCIN 40 UNITS IN LACTATED RINGERS INFUSION - SIMPLE MED
1.0000 m[IU]/min | INTRAVENOUS | Status: DC
Start: 2017-08-14 — End: 2017-08-15
  Administered 2017-08-14: 2 m[IU]/min via INTRAVENOUS
  Filled 2017-08-14: qty 1000

## 2017-08-14 MED ORDER — PHENYLEPHRINE 40 MCG/ML (10ML) SYRINGE FOR IV PUSH (FOR BLOOD PRESSURE SUPPORT)
80.0000 ug | PREFILLED_SYRINGE | INTRAVENOUS | Status: DC | PRN
  Administered 2017-08-14 (×2): 80 ug via INTRAVENOUS

## 2017-08-14 MED ORDER — PHENYLEPHRINE 40 MCG/ML (10ML) SYRINGE FOR IV PUSH (FOR BLOOD PRESSURE SUPPORT)
80.0000 ug | PREFILLED_SYRINGE | INTRAVENOUS | Status: DC | PRN
  Administered 2017-08-14: 80 ug via INTRAVENOUS
  Filled 2017-08-14 (×3): qty 10

## 2017-08-14 MED ORDER — EPHEDRINE 5 MG/ML INJ
INTRAVENOUS | Status: AC
  Filled 2017-08-14: qty 4

## 2017-08-14 MED ORDER — LIDOCAINE-EPINEPHRINE (PF) 2 %-1:200000 IJ SOLN
INTRAMUSCULAR | Status: AC
  Filled 2017-08-14: qty 20

## 2017-08-14 MED ORDER — PENICILLIN G POT IN DEXTROSE 60000 UNIT/ML IV SOLN
3.0000 10*6.[IU] | INTRAVENOUS | Status: DC
  Administered 2017-08-14 (×3): 3 10*6.[IU] via INTRAVENOUS
  Filled 2017-08-14 (×4): qty 50

## 2017-08-14 MED ORDER — FENTANYL 2.5 MCG/ML BUPIVACAINE 1/10 % EPIDURAL INFUSION (WH - ANES)
14.0000 mL/h | INTRAMUSCULAR | Status: DC | PRN
  Administered 2017-08-14 (×2): 14 mL/h via EPIDURAL
  Filled 2017-08-14 (×2): qty 100

## 2017-08-14 MED ORDER — EPHEDRINE 5 MG/ML INJ
10.0000 mg | INTRAVENOUS | Status: AC | PRN
  Administered 2017-08-14 (×2): 10 mg via INTRAVENOUS

## 2017-08-14 MED ORDER — ONDANSETRON HCL 4 MG/2ML IJ SOLN
INTRAMUSCULAR | Status: AC
  Filled 2017-08-14: qty 2

## 2017-08-14 MED ORDER — KETOROLAC TROMETHAMINE 30 MG/ML IJ SOLN: 30.0000 mg | Freq: Four times a day (QID) | INTRAMUSCULAR | Status: AC | PRN

## 2017-08-14 MED ORDER — ONDANSETRON HCL 4 MG/2ML IJ SOLN
INTRAMUSCULAR | Status: DC | PRN
  Administered 2017-08-14: 4 mg via INTRAVENOUS

## 2017-08-14 MED ORDER — SODIUM CHLORIDE 0.9 % IV SOLN
INTRAVENOUS | Status: DC | PRN
  Administered 2017-08-14: 60 ug/min via INTRAVENOUS

## 2017-08-14 MED ORDER — CEFAZOLIN SODIUM-DEXTROSE 2-3 GM-% IV SOLR
INTRAVENOUS | Status: DC | PRN
  Administered 2017-08-14: 2 g via INTRAVENOUS

## 2017-08-14 MED ORDER — LACTATED RINGERS IV SOLN: 500.0000 mL | Freq: Once | INTRAVENOUS | Status: DC

## 2017-08-14 MED ORDER — OXYTOCIN 40 UNITS IN LACTATED RINGERS INFUSION - SIMPLE MED: 2.5000 [IU]/h | INTRAVENOUS | Status: DC

## 2017-08-14 MED ORDER — OXYTOCIN 10 UNIT/ML IJ SOLN
INTRAVENOUS | Status: DC | PRN
  Administered 2017-08-14: 40 [IU] via INTRAVENOUS

## 2017-08-14 SURGICAL SUPPLY — 37 items
APL SKNCLS STERI-STRIP NONHPOA (GAUZE/BANDAGES/DRESSINGS) ×1
BENZOIN TINCTURE PRP APPL 2/3 (GAUZE/BANDAGES/DRESSINGS) ×2 IMPLANT
CHLORAPREP W/TINT 26ML (MISCELLANEOUS) ×3 IMPLANT
CLAMP CORD UMBIL (MISCELLANEOUS) IMPLANT
CLOSURE STERI STRIP 1/2 X4 (GAUZE/BANDAGES/DRESSINGS) ×2 IMPLANT
CLOSURE WOUND 1/2 X4 (GAUZE/BANDAGES/DRESSINGS)
CLOTH BEACON ORANGE TIMEOUT ST (SAFETY) ×3 IMPLANT
DRSG OPSITE POSTOP 4X10 (GAUZE/BANDAGES/DRESSINGS) ×3 IMPLANT
ELECT REM PT RETURN 9FT ADLT (ELECTROSURGICAL) ×3
ELECTRODE REM PT RTRN 9FT ADLT (ELECTROSURGICAL) ×1 IMPLANT
EXTRACTOR VACUUM KIWI (MISCELLANEOUS) IMPLANT
GLOVE BIO SURGEON STRL SZ 6.5 (GLOVE) ×2 IMPLANT
GLOVE BIO SURGEONS STRL SZ 6.5 (GLOVE) ×1
GLOVE BIOGEL PI IND STRL 7.0 (GLOVE) ×1 IMPLANT
GLOVE BIOGEL PI INDICATOR 7.0 (GLOVE) ×2
GOWN STRL REUS W/TWL LRG LVL3 (GOWN DISPOSABLE) ×6 IMPLANT
KIT ABG SYR 3ML LUER SLIP (SYRINGE) IMPLANT
NDL HYPO 25X5/8 SAFETYGLIDE (NEEDLE) IMPLANT
NEEDLE HYPO 25X5/8 SAFETYGLIDE (NEEDLE) IMPLANT
NS IRRIG 1000ML POUR BTL (IV SOLUTION) ×3 IMPLANT
PACK C SECTION WH (CUSTOM PROCEDURE TRAY) ×3 IMPLANT
PAD OB MATERNITY 4.3X12.25 (PERSONAL CARE ITEMS) ×3 IMPLANT
PENCIL SMOKE EVAC W/HOLSTER (ELECTROSURGICAL) ×3 IMPLANT
RTRCTR C-SECT PINK 25CM LRG (MISCELLANEOUS) ×3 IMPLANT
STRIP CLOSURE SKIN 1/2X4 (GAUZE/BANDAGES/DRESSINGS) IMPLANT
SUT CHROMIC 1 CTX 36 (SUTURE) ×6 IMPLANT
SUT PLAIN 0 NONE (SUTURE) IMPLANT
SUT PLAIN 2 0 XLH (SUTURE) ×3 IMPLANT
SUT VIC AB 0 CT1 27 (SUTURE) ×6
SUT VIC AB 0 CT1 27XBRD ANBCTR (SUTURE) ×2 IMPLANT
SUT VIC AB 2-0 CT1 27 (SUTURE) ×3
SUT VIC AB 2-0 CT1 TAPERPNT 27 (SUTURE) ×1 IMPLANT
SUT VIC AB 3-0 CT1 27 (SUTURE)
SUT VIC AB 3-0 CT1 TAPERPNT 27 (SUTURE) IMPLANT
SUT VIC AB 4-0 KS 27 (SUTURE) ×3 IMPLANT
TOWEL OR 17X24 6PK STRL BLUE (TOWEL DISPOSABLE) ×3 IMPLANT
TRAY FOLEY BAG SILVER LF 14FR (SET/KITS/TRAYS/PACK) ×3 IMPLANT

## 2017-08-14 NOTE — Op Note (Addendum)
Operative Note    Preoperative Diagnosis Arrest of dilation Failed VBAC  Postoperative Diagnosis Same  Procedure Repeat LTCS with 2 layer closure of uterus  Surgeon Paula Compton, MD  Anesthesia Epidural  Fluids: EBL 1025m UOP 2035mclear IVF 300053mR  Findings A viable female infant in the vertex, OP position.  LUS distended but no scar dehiscence.  Normal ovaries and tubes  Specimen Placenta to L&D  Procedure Note Patient was taken to the operating room where epidural anesthesia was boosted and found to be adequate by Allis clamp test. She was prepped and draped in the normal sterile fashion in the dorsal supine position with a leftward tilt. An appropriate time out was performed. A Pfannenstiel skin incision was then made through a pre-existing scar with the scalpel and carried through to the underlying layer of fascia by sharp dissection and Bovie cautery. The fascia was nicked in the midline and the incision was extended laterally with Mayo scissors. The inferior aspect of the incision was grasped Coker clamps and dissected off the underlying rectus muscles. In a similar fashion the superior aspect was dissected off the rectus muscles. Rectus muscles were separated in the midline and the peritoneal cavity entered bluntly. The peritoneal incision was then extended both superiorly and inferiorly with careful attention to avoid both bowel and bladder.  The lower uterine segment appeared edematous and distended, but there was no scar dehiscence seen.   The Alexis self-retaining wound retractor was then placed within the incision and the lower uterine segment exposed. The bladder flap was developed with Metzenbaum scissors and pushed away from the lower uterine segment. The lower uterine segment was then incised in a transverse fashion and the cavity itself entered bluntly. The incision was extended bluntly. The infant's head was then lifted and delivered from the incision without  difficulty. The remainder of the infant delivered and the nose and mouth bulb suctioned with the cord clamped and cut as well. The infant was handed off to the waiting pediatricians. The placenta was then spontaneously expressed from the uterus and the uterus cleared of all clots and debris with moist lap sponge. The uterine incision was then repaired in 2 layers the first layer was a running locked layer of 1-0 chromic and the second an imbricating layer of the same suture. The tubes and ovaries were inspected and the gutters cleared of all clots and debris. The uterine incision was inspected and found to be hemostatic   The urine was noted to be clear.. All instruments and sponges as well as the Alexis retractor were then removed from the abdomen. The rectus muscles and peritoneum were then reapproximated with a running suture of 2-0 Vicryl. The fascia was then closed with 0 Vicryl in a running fashion. Subcutaneous tissue was reapproximated with 2-0 chromic in a running fashion. The skin was closed with a subcuticular stitch of 4-0 Vicryl on a Keith needle and then reinforced with benzoin and Steri-Strips. At the conclusion of the procedure all instruments and sponge counts were correct. Patient was taken to the recovery room in good condition with her baby accompanying her skin to skin.   D/w pt circumcision and plans in the office

## 2017-08-14 NOTE — Anesthesia Preprocedure Evaluation (Signed)
Anesthesia Evaluation  Patient identified by MRN, date of birth, ID band Patient awake    Reviewed: Allergy & Precautions, H&P , NPO status , Patient's Chart, lab work & pertinent test results, reviewed documented beta blocker date and time   Airway Mallampati: II  TM Distance: >3 FB Neck ROM: full    Dental no notable dental hx.    Pulmonary neg pulmonary ROS, former smoker,    Pulmonary exam normal breath sounds clear to auscultation       Cardiovascular negative cardio ROS Normal cardiovascular exam Rhythm:regular Rate:Normal     Neuro/Psych negative neurological ROS  negative psych ROS   GI/Hepatic negative GI ROS, Neg liver ROS,   Endo/Other  negative endocrine ROS  Renal/GU negative Renal ROS  negative genitourinary   Musculoskeletal   Abdominal   Peds  Hematology negative hematology ROS (+)   Anesthesia Other Findings   Reproductive/Obstetrics (+) Pregnancy                             Anesthesia Physical Anesthesia Plan  ASA: II  Anesthesia Plan: Epidural   Post-op Pain Management:    Induction:   PONV Risk Score and Plan:   Airway Management Planned:   Additional Equipment:   Intra-op Plan:   Post-operative Plan:   Informed Consent: I have reviewed the patients History and Physical, chart, labs and discussed the procedure including the risks, benefits and alternatives for the proposed anesthesia with the patient or authorized representative who has indicated his/her understanding and acceptance.     Plan Discussed with:   Anesthesia Plan Comments:         Anesthesia Quick Evaluation

## 2017-08-14 NOTE — Progress Notes (Signed)
Patient ID: Joyce Solomon, female   DOB: 11-Sep-1986, 31 y.o.   MRN: [id number] FHR improved to category 1 now that BP back over 100/50. Will continue to follow progress.   Increase pitocin as needed.

## 2017-08-14 NOTE — Progress Notes (Signed)
Patient ID: DELILA KUKLINSKI, female   DOB: [birth year], 31 y.o.   MRN: [id number] CTSP about 1300 for issues with repetitive decelerations  Pt received epidural this AM with no initial improvement in pain.  Anesthesia then reassessed and dosed the patient with 5 cc 2% lidocaine and patient had resulting drop in BP to 50-60/30 range and baby began to have late decelerations.  RN has been treating low BP with fluid bolus, epinephrine and phenylepinephrine and has manged to get it into 90/40 range.  FHR has periods of Category 1 tracing intermittently but then will have lates when BP is low.   We have placed internal monitors so we can follow FHR more closely.  Pt is having no pain and IUPC shows no tachysystole. Will continue to follow closely.

## 2017-08-14 NOTE — Progress Notes (Signed)
Patient ID: Joyce Solomon, female   DOB: 1986/02/09, 31 y.o.   MRN: [id number] Pt feeling mild contractions  afeb VSS FHR Category 1  Cervix 60/2-3/-2  AROM clear, scant  Pitocin at 37m PCN on board for +GBS Planning epidural.

## 2017-08-14 NOTE — Progress Notes (Signed)
Patient ID: Joyce Solomon, female   DOB: [birth year], 31 y.o.   MRN: [id number] Pt had episode of emesis and became uncomfortable.  Discovered epidural had become undone at connection and was reconnected  FHR had decel x 3 minutes with emesis, then resolved with bolus and repostioning, baseline increased to 160's but afebrile  Cervix 80/6/-1 Stil OP  D/w pt still no progression and she would like to proceed with repeat c-section. D/w her risks and benefits of bleeding, infection and possible damage to bowel and bladder. Desires to proceed.  OR notified and pitocin turned off.  Will proceed when OR ready

## 2017-08-14 NOTE — Progress Notes (Signed)
Patient ID: Joyce Solomon, female   DOB: [birth year], 31 y.o.   MRN: [id number] Pt comfortable with epidural  afeb VSS FHR category 1 with some intermittent episodes of variable decelerations that are positional, occasional lates--resolve with repositioning  Cervix 80/6/-1  Direct OP  D/w pt slow progress, but overall tracing is reassuring so I am willing to give more time as she prefers not to have a repeat c-section unless necessary.  Will recheck in 2 hours and see if makes any progress as long as FHR remains category 1.

## 2017-08-14 NOTE — Transfer of Care (Signed)
Immediate Anesthesia Transfer of Care Note  Patient: Joyce Solomon  Procedure(s) Performed: Procedure(s): CESAREAN SECTION (N/A)  Patient Location: PACU  Anesthesia Type:Epidural  Level of Consciousness: awake, alert  and oriented  Airway & Oxygen Therapy: Patient Spontanous Breathing  Post-op Assessment: Report given to RN and Post -op Vital signs reviewed and stable  Post vital signs: Reviewed and stable  Last Vitals:  Vitals:   08/14/17 2101 08/14/17 2132  BP: (!) 139/98   Pulse: (!) 110 (!) 101  Resp: 16   Temp:      Last Pain:  Vitals:   08/14/17 2132  TempSrc:   PainSc: 8          Complications: No apparent anesthesia complications

## 2017-08-14 NOTE — Progress Notes (Signed)
Patient ID: Joyce Solomon, female   DOB: [birth year], 31 y.o.   MRN: [id number] FHR continues to be category 1 Pt comfortable with epidural which was restarted after BP drop resolved Cervix 5+/80/-1  Direct OP  Rotating pt in Sims position and IUPC replaced as did not seem to be working well  This baby is expected to be 7-7/12 lbs and other baby delivered vaginally was 5#4, but hoping slow progress related to OP presentation and will give some time

## 2017-08-14 NOTE — Anesthesia Pain Management Evaluation Note (Signed)
  CRNA Pain Management Visit Note  Patient: Joyce Solomon, 31 y.o., female  "Hello I am a member of the anesthesia team at [facility]. We have an anesthesia team available at all times to provide care throughout the hospital, including epidural management and anesthesia for C-section. I don't know your plan for the delivery whether it a natural birth, water birth, IV sedation, nitrous supplementation, doula or epidural, but we want to meet your pain goals."   1.Was your pain managed to your expectations on prior hospitalizations?   Yes   2.What is your expectation for pain management during this hospitalization?     Epidural  3.How can we help you reach that goal? Epidural when desired.  Record the patient's initial score and the patient's pain goal.   Pain: 0  Pain Goal: 5 [facility] wants you to be able to say your pain was always managed very well.  Cotton Beckley 08/14/2017

## 2017-08-14 NOTE — Anesthesia Procedure Notes (Signed)
Epidural Patient location during procedure: OB Start time: 08/14/2017 9:30 AM End time: 08/14/2017 9:45 AM  Staffing Anesthesiologist: Braiden Rodman  Preanesthetic Checklist Completed: patient identified, site marked, surgical consent, pre-op evaluation, timeout performed, IV checked, risks and benefits discussed and monitors and equipment checked  Epidural Patient position: sitting Prep: site prepped and draped and DuraPrep Patient monitoring: continuous pulse ox and blood pressure Approach: midline Location: L4-L5 Injection technique: LOR air  Needle:  Needle type: Tuohy  Needle gauge: 17 G Needle length: 9 cm and 9 Needle insertion depth: 5 cm Catheter type: closed end flexible Catheter size: 19 Gauge Catheter at skin depth: 10 cm Test dose: negative  Assessment Events: blood not aspirated, injection not painful, no injection resistance, negative IV test and no paresthesia

## 2017-08-15 ENCOUNTER — Encounter (HOSPITAL_COMMUNITY): Payer: Self-pay

## 2017-08-15 DIAGNOSIS — Z98891 History of uterine scar from previous surgery: Secondary | ICD-10-CM

## 2017-08-15 LAB — CBC
HEMATOCRIT: 31.9 % — AB (ref 36.0–46.0)
Hemoglobin: 10.9 g/dL — ABNORMAL LOW (ref 12.0–15.0)
MCH: 28.7 pg (ref 26.0–34.0)
MCHC: 34.2 g/dL (ref 30.0–36.0)
MCV: 83.9 fL (ref 78.0–100.0)
PLATELETS: 194 10*3/uL (ref 150–400)
RBC: 3.8 MIL/uL — ABNORMAL LOW (ref 3.87–5.11)
RDW: 15.4 % (ref 11.5–15.5)
WBC: 22.8 10*3/uL — AB (ref 4.0–10.5)

## 2017-08-15 MED ORDER — DIPHENHYDRAMINE HCL 25 MG PO CAPS
25.0000 mg | ORAL_CAPSULE | ORAL | Status: DC | PRN
Start: 2017-08-15 — End: 2017-08-16
  Administered 2017-08-15: 25 mg via ORAL

## 2017-08-15 MED ORDER — ACETAMINOPHEN 500 MG PO TABS
1000.0000 mg | ORAL_TABLET | Freq: Four times a day (QID) | ORAL | Status: AC
  Administered 2017-08-15 – 2017-08-16 (×3): 1000 mg via ORAL
  Filled 2017-08-15 (×3): qty 2

## 2017-08-15 MED ORDER — SIMETHICONE 80 MG PO CHEW
80.0000 mg | CHEWABLE_TABLET | ORAL | Status: DC
  Administered 2017-08-15: 80 mg via ORAL
  Filled 2017-08-15: qty 1

## 2017-08-15 MED ORDER — NALBUPHINE HCL 10 MG/ML IJ SOLN: 5.0000 mg | INTRAMUSCULAR | Status: DC | PRN

## 2017-08-15 MED ORDER — SIMETHICONE 80 MG PO CHEW
80.0000 mg | CHEWABLE_TABLET | Freq: Three times a day (TID) | ORAL | Status: DC
  Administered 2017-08-15 – 2017-08-16 (×4): 80 mg via ORAL
  Filled 2017-08-15 (×4): qty 1

## 2017-08-15 MED ORDER — ZOLPIDEM TARTRATE 5 MG PO TABS: 5.0000 mg | ORAL_TABLET | Freq: Every evening | ORAL | Status: DC | PRN

## 2017-08-15 MED ORDER — KETOROLAC TROMETHAMINE 30 MG/ML IJ SOLN
INTRAMUSCULAR | Status: AC
  Filled 2017-08-15: qty 1

## 2017-08-15 MED ORDER — NALOXONE HCL 0.4 MG/ML IJ SOLN: 0.4000 mg | INTRAMUSCULAR | Status: DC | PRN

## 2017-08-15 MED ORDER — OXYCODONE HCL 5 MG PO TABS
5.0000 mg | ORAL_TABLET | ORAL | Status: DC | PRN
  Administered 2017-08-15: 5 mg via ORAL
  Filled 2017-08-15: qty 1

## 2017-08-15 MED ORDER — WITCH HAZEL-GLYCERIN EX PADS: 1.0000 "application " | MEDICATED_PAD | CUTANEOUS | Status: DC | PRN

## 2017-08-15 MED ORDER — MENTHOL 3 MG MT LOZG: 1.0000 | LOZENGE | OROMUCOSAL | Status: DC | PRN

## 2017-08-15 MED ORDER — SCOPOLAMINE 1 MG/3DAYS TD PT72: 1.0000 | MEDICATED_PATCH | Freq: Once | TRANSDERMAL | Status: DC

## 2017-08-15 MED ORDER — ONDANSETRON HCL 4 MG/2ML IJ SOLN: 4.0000 mg | Freq: Three times a day (TID) | INTRAMUSCULAR | Status: DC | PRN

## 2017-08-15 MED ORDER — TETANUS-DIPHTH-ACELL PERTUSSIS 5-2.5-18.5 LF-MCG/0.5 IM SUSP
0.5000 mL | Freq: Once | INTRAMUSCULAR | Status: DC
Start: 2017-08-15 — End: 2017-08-16

## 2017-08-15 MED ORDER — IBUPROFEN 600 MG PO TABS
600.0000 mg | ORAL_TABLET | Freq: Four times a day (QID) | ORAL | Status: DC
  Administered 2017-08-15 – 2017-08-16 (×6): 600 mg via ORAL
  Filled 2017-08-15 (×6): qty 1

## 2017-08-15 MED ORDER — NALOXONE HCL 2 MG/2ML IJ SOSY
1.0000 ug/kg/h | PREFILLED_SYRINGE | INTRAVENOUS | Status: DC | PRN
  Filled 2017-08-15: qty 2

## 2017-08-15 MED ORDER — COCONUT OIL OIL: 1.0000 "application " | TOPICAL_OIL | Status: DC | PRN

## 2017-08-15 MED ORDER — LACTATED RINGERS IV SOLN
INTRAVENOUS | Status: DC
  Administered 2017-08-15: 02:00:00 via INTRAVENOUS

## 2017-08-15 MED ORDER — NALBUPHINE HCL 10 MG/ML IJ SOLN: 5.0000 mg | Freq: Once | INTRAMUSCULAR | Status: DC | PRN

## 2017-08-15 MED ORDER — OXYTOCIN 40 UNITS IN LACTATED RINGERS INFUSION - SIMPLE MED
2.5000 [IU]/h | INTRAVENOUS | Status: AC
  Administered 2017-08-15: 2.5 [IU]/h via INTRAVENOUS
  Filled 2017-08-15: qty 1000

## 2017-08-15 MED ORDER — SIMETHICONE 80 MG PO CHEW: 80.0000 mg | CHEWABLE_TABLET | ORAL | Status: DC | PRN

## 2017-08-15 MED ORDER — PRENATAL MULTIVITAMIN CH
1.0000 | ORAL_TABLET | Freq: Every day | ORAL | Status: DC
  Administered 2017-08-15 – 2017-08-16 (×2): 1 via ORAL
  Filled 2017-08-15 (×2): qty 1

## 2017-08-15 MED ORDER — DIPHENHYDRAMINE HCL 25 MG PO CAPS
25.0000 mg | ORAL_CAPSULE | Freq: Four times a day (QID) | ORAL | Status: DC | PRN
  Administered 2017-08-15: 25 mg via ORAL
  Filled 2017-08-15 (×2): qty 1

## 2017-08-15 MED ORDER — SODIUM CHLORIDE 0.9% FLUSH: 3.0000 mL | INTRAVENOUS | Status: DC | PRN

## 2017-08-15 MED ORDER — DIPHENHYDRAMINE HCL 50 MG/ML IJ SOLN: 12.5000 mg | INTRAMUSCULAR | Status: DC | PRN

## 2017-08-15 MED ORDER — NALBUPHINE HCL 10 MG/ML IJ SOLN
5.0000 mg | INTRAMUSCULAR | Status: DC | PRN
  Administered 2017-08-15: 5 mg via SUBCUTANEOUS
  Filled 2017-08-15: qty 1

## 2017-08-15 MED ORDER — SENNOSIDES-DOCUSATE SODIUM 8.6-50 MG PO TABS
2.0000 | ORAL_TABLET | ORAL | Status: DC
  Administered 2017-08-15: 2 via ORAL
  Filled 2017-08-15: qty 2

## 2017-08-15 MED ORDER — ACETAMINOPHEN 325 MG PO TABS
650.0000 mg | ORAL_TABLET | ORAL | Status: DC | PRN
  Administered 2017-08-15 (×2): 650 mg via ORAL
  Filled 2017-08-15 (×2): qty 2

## 2017-08-15 MED ORDER — OXYCODONE HCL 5 MG PO TABS
10.0000 mg | ORAL_TABLET | ORAL | Status: DC | PRN
  Administered 2017-08-15 – 2017-08-16 (×2): 10 mg via ORAL
  Filled 2017-08-15 (×2): qty 2

## 2017-08-15 MED ORDER — DIBUCAINE 1 % RE OINT: 1.0000 "application " | TOPICAL_OINTMENT | RECTAL | Status: DC | PRN

## 2017-08-15 NOTE — Lactation Note (Signed)
This note was copied from a baby's chart. Lactation Consultation Note  Patient Name: Joyce Solomon [id number] Date: 08/15/2017 Reason for consult: Initial assessment   Initial assessment with mom of 93 hour old infant. Infant with 3 BF for 10 minutes, 1 BF attempt, formula x 2 5-12 cc, 0 voids and 1 stool since birth. LATCH scores 7. infant weight 8 lb 2.3 oz. Mom reports she BF her 3 and 32 yo briefly. She reports she wants to breast and formula feed. LEAD teaching completed.   Enc mom to feed infant STS 8-12 x in 24 hours at first feeding cues. Enc mom to use pillow and head support with feeding. We attempted to latch infant to the left breast in the cross cradle hold, infant not interested in latching at this time. Worked with mom on positioning, getting herself comfortable first, pillow support, and hand expression. Colostrum easily expressible. Mom with soft compressible breasts and areola with everted nipples.   Set up DEBP at mom's request. Showed mom how to pump in Initiate setting, assembling, disassembling and cleaning of pump parts. Enc mom to BF prior to offering formula. Enc mom to pump post BF and follow with hand expression. Mom was pumping when [place] left room. Reviewed what to expect with pumping and importance of hand expression. Enc mom to given any EBM prior to offering formula. Reviewed breast milk storage at room temperature.   BF Resources handout and [facility] brochure given, mom informed of IP/OP Services, BF Support Groups and [place] phone #. Mom does not have a pump at home, she reports she is active with [employer]. Mom very drowsy and reports she has had no sleep, enc her to sleep when infant sleeping. Mom had visitors that left just before [place] left. Dad was holding infant and infant asleep.       Maternal Data Formula Feeding for Exclusion: Yes Reason for exclusion: Mother's choice to formula and breast feed on admission Has patient been taught Hand Expression?: Yes Does the patient  have breastfeeding experience prior to this delivery?: Yes  Feeding Feeding Type: Breast Fed  LATCH Score Latch: Too sleepy or reluctant, no latch achieved, no sucking elicited.  Audible Swallowing: None  Type of Nipple: Everted at rest and after stimulation  Comfort (Breast/Nipple): Soft / non-tender  Hold (Positioning): Assistance needed to correctly position infant at breast and maintain latch.  LATCH Score: 5  Interventions Interventions: Breast feeding basics reviewed;Support pillows;Assisted with latch;Position options;Skin to skin;Expressed milk;Hand express;DEBP  Lactation Tools Discussed/Used WIC Program: Yes Pump Review: Setup, frequency, and cleaning;Milk Storage Initiated by:: Nonah Mattes, RN, IBCLC Date initiated:: 08/15/17   Consult Status Consult Status: Follow-up Date: 08/16/17 Follow-up type: In-patient    [name] 08/15/2017, 5:09 PM

## 2017-08-15 NOTE — Progress Notes (Signed)
Subjective: Postpartum Day #1: Cesarean Delivery Patient reports incisional pain and tolerating PO.    Objective: Vital signs in last 24 hours: Temp:  [97.4 F (36.3 C)-98.9 F (37.2 C)] 98.4 F (36.9 C) (09/26 0400) Pulse Rate:  [75-179] 99 (09/26 0400) Resp:  [13-21] 18 (09/26 0500) BP: (54-139)/(34-98) 104/57 (09/26 0400) SpO2:  [92 %-100 %] 92 % (09/26 0400)  Physical Exam:  General: alert Lochia: appropriate Uterine Fundus: firm Incision: current dressing with only a small area of serosanguinous drainage   Recent Labs  08/14/17 0729 08/15/17 0547  HGB 12.2 10.9*  HCT 36.1 31.9*    Assessment/Plan: Status post Cesarean section. Doing well postoperatively.  Continue current care, ambulate.  Blane Ohara Vue Pavon 08/15/2017, 8:20 AM

## 2017-08-15 NOTE — Anesthesia Postprocedure Evaluation (Signed)
Anesthesia Post Note  Patient: Joyce Solomon  Procedure(s) Performed: Procedure(s) (LRB): CESAREAN SECTION (N/A)     Patient location during evaluation: Mother Baby Anesthesia Type: Epidural Level of consciousness: awake Pain management: satisfactory to patient Vital Signs Assessment: post-procedure vital signs reviewed and stable Respiratory status: spontaneous breathing Cardiovascular status: stable Anesthetic complications: no    Last Vitals:  Vitals:   08/15/17 0400 08/15/17 0500  BP: (!) 104/57   Pulse: 99   Resp: 18 18  Temp: 36.9 C   SpO2: 92%     Last Pain:  Vitals:   08/15/17 0700  TempSrc:   PainSc: 4    Pain Goal:                 [employer]

## 2017-08-16 ENCOUNTER — Encounter (HOSPITAL_COMMUNITY): Payer: Self-pay

## 2017-08-16 LAB — CBC
HCT: 29.5 % — ABNORMAL LOW (ref 36.0–46.0)
HEMOGLOBIN: 9.9 g/dL — AB (ref 12.0–15.0)
MCH: 28.4 pg (ref 26.0–34.0)
MCHC: 33.6 g/dL (ref 30.0–36.0)
MCV: 84.8 fL (ref 78.0–100.0)
Platelets: 211 10*3/uL (ref 150–400)
RBC: 3.48 MIL/uL — AB (ref 3.87–5.11)
RDW: 15.7 % — ABNORMAL HIGH (ref 11.5–15.5)
WBC: 19.2 10*3/uL — AB (ref 4.0–10.5)

## 2017-08-16 MED ORDER — OXYCODONE-ACETAMINOPHEN 5-325 MG PO TABS: 1.0000 | ORAL_TABLET | ORAL | 0 refills | Status: AC | PRN

## 2017-08-16 MED ORDER — IBUPROFEN 600 MG PO TABS: 600.0000 mg | ORAL_TABLET | Freq: Four times a day (QID) | ORAL | 1 refills | Status: DC | PRN

## 2017-08-16 NOTE — Discharge Instructions (Signed)
Nothing in vagina for 6 weeks.  No sex, tampons, and douching.  Other instructions as in [employer].

## 2017-08-16 NOTE — Lactation Note (Signed)
This note was copied from a baby's chart. Lactation Consultation Note  Patient Name: Joyce Solomon [id number] Date: 08/16/2017 Reason for consult: Follow-up assessment  Baby 17 hours old. Mom reports that she still wants to BR and FO feed baby, and requested assistance with latching. Assisted mom to latch baby to right breast in football position, and baby latched deeply and suckled rhythmically with a few swallows noted. Mom able to hand express with colostrum flowing. Discussed supply and demand, progression of milk coming to volume and engorgement prevention/treatment. Enc mom to put baby to breast first, then supplement with EBM/formula. Mom states that she spoke with [facility] yesterday about getting a pump. Mom aware of OP/BFSG and [place] phone line assistance after D/C.   Maternal Data    Feeding Feeding Type: Breast Fed Nipple Type: Slow - flow  LATCH Score Latch: Grasps breast easily, tongue down, lips flanged, rhythmical sucking.  Audible Swallowing: A few with stimulation  Type of Nipple: Everted at rest and after stimulation  Comfort (Breast/Nipple): Soft / non-tender  Hold (Positioning): Assistance needed to correctly position infant at breast and maintain latch.  LATCH Score: 8  Interventions Interventions: Breast feeding basics reviewed;Assisted with latch;Skin to skin;Hand express;Adjust position;Breast compression;Support pillows;Position options  Lactation Tools Discussed/Used     Consult Status Consult Status: PRN    Andres Labrum 08/16/2017, 10:16 AM

## 2017-08-16 NOTE — Progress Notes (Addendum)
Patient ID: Joyce Solomon, female   DOB: Mar 23, 1986, 31 y.o.   MRN: [id number] Pt doing well. Pain controlled with meds. Ambulating and voiding with no issues. Lochia mild. Bonding well with baby. Desires discharge to home today VSS -102-114/50-63 ABD- FF EXT - no edema  22>10.9<194  A./P: POD#2 s/p repeat c/s after failed VBAC          Recheck cbc for white count this am           Discharge to home with instructions to follow up in 2 weeks for incision check and 6 weeks for postpartum visit; will get phone call in 3 weeks

## 2017-08-16 NOTE — Discharge Summary (Signed)
OB Discharge Summary     Patient Name: Joyce Solomon DOB: [birth year] MRN: [id number]  Date of admission: 08/14/2017 Delivering MD: Paula Compton   Date of discharge: 08/16/2017  Admitting diagnosis: INDUCTION Intrauterine pregnancy: [redacted]w[redacted]d    Secondary diagnosis:  Active Problems:   Term pregnancy   S/P repeat low transverse C-section  Additional problems: none     Discharge diagnosis: Term Pregnancy Delivered                                                                                                Post partum procedures:none  Augmentation: AROM and Pitocin  [facility]  Induction of Labor With Cesarean Section  31y.o. yo [id number] admitted to the hospital 08/14/2017 for induction of labor. Patient had a labor course significant for failed VBAC due to arrest of descent and NRFHT. The patient went for cesarean section due to Arrest of Dilation and Arrest of Descent, and delivered a Viable infant,@BABYSUPPRESS (DBLINK,ept,110,,1,,) Membrane Rupture Time/Date: 32:32 AM ,08/14/2017   @Details  of operation can be found in separate operative Note.  Patient had an uncomplicated postpartum course. She is ambulating, tolerating a regular diet, passing flatus, and urinating well.  Patient is discharged home in stable condition on 08/16/17.                                    Physical exam  Vitals:   08/15/17 0946 08/15/17 1401 08/15/17 2345 08/16/17 0600  BP: (!) 126/48 114/71 102/63 (!) 111/50  Pulse: 98 (!) 102 87 73  Resp:   16 16  Temp: 99 F (37.2 C) 98.6 F (37 C)  98.2 F (36.8 C)  TempSrc: Axillary   Oral  SpO2:      Weight:      Height:       General: alert, cooperative and no distress Lochia: appropriate Uterine Fundus: firm Incision: Healing well with no significant drainage DVT Evaluation: Negative Homan's sign. Labs: Lab Results  Component Value Date   WBC 22.8 (H) 08/15/2017   HGB 10.9 (L) 08/15/2017    HCT 31.9 (L) 08/15/2017   MCV 83.9 08/15/2017   PLT 194 08/15/2017   CMP Latest Ref Rng & Units 11/04/2016  Glucose 65 - 99 mg/dL 98  BUN 6 - 20 mg/dL 13  Creatinine 0.44 - 1.00 mg/dL 0.64  Sodium 135 - 145 mmol/L 136  Potassium 3.5 - 5.1 mmol/L 4.0  Chloride 101 - 111 mmol/L 107  CO2 22 - 32 mmol/L 22  Calcium 8.9 - 10.3 mg/dL 9.2  Total Protein 6.5 - 8.1 g/dL 7.9  Total Bilirubin 0.3 - 1.2 mg/dL 0.4  Alkaline Phos 38 - 126 U/L 63  AST 15 - 41 U/L 15  ALT 14 - 54 U/L 11(L)    Discharge instruction: per After Visit Summary and "Baby and Me Booklet".  After visit meds:  Allergies as of 08/16/2017      Reactions   Latex Rash   Remicade [infliximab]  Itching      Medication List    TAKE these medications   acetaminophen 325 MG tablet Commonly known as:  TYLENOL Take 650 mg by mouth every 6 (six) hours as needed for moderate pain.   cephALEXin 500 MG capsule Commonly known as:  KEFLEX Take 1 capsule (500 mg total) by mouth 4 (four) times daily.   HUMIRA PEN 40 MG/0.8ML Pnkt Generic drug:  Adalimumab INJECT 40MG INTO THE SKIN EVERY FOURTEEN DAYS   ibuprofen 600 MG tablet Commonly known as:  ADVIL,MOTRIN Take 1 tablet (600 mg total) by mouth every 6 (six) hours as needed for moderate pain or cramping.   oxyCODONE-acetaminophen 5-325 MG tablet Commonly known as:  PERCOCET Take 1 tablet by mouth every 4 (four) hours as needed for severe pain.   Prenatal Vitamin 27-0.8 MG Tabs TAKE 1 TABLET BY MOUTH DAILY            Discharge Care Instructions        Start     Ordered   08/16/17 0000  ibuprofen (ADVIL,MOTRIN) 600 MG tablet  Every 6 hours PRN     08/16/17 0927   08/16/17 0000  oxyCODONE-acetaminophen (PERCOCET) 5-325 MG tablet  Every 4 hours PRN     08/16/17 0927   08/16/17 0000  Diet - low sodium heart healthy     08/16/17 0927   08/16/17 0000  Discharge instructions    Comments:  Nothing in vagina x 6 weeks Pt will receive a phone call from office in  3 weeks and should make an appointment to be seen in 6 weeks for postpartum visit   08/16/17 0927   08/16/17 0000  Call MD for:  temperature >100.4     08/16/17 0927   08/16/17 0000  Call MD for:  persistant nausea and vomiting     08/16/17 0927   08/16/17 0000  Call MD for:  severe uncontrolled pain     08/16/17 0927   08/16/17 0000  Call MD for:  redness, tenderness, or signs of infection (pain, swelling, redness, odor or green/yellow discharge around incision site)     08/16/17 0927   08/16/17 0000  Call MD for:  difficulty breathing, headache or visual disturbances     08/16/17 0927   08/16/17 0000  Call MD for:  persistant dizziness or light-headedness     08/16/17 0927   08/16/17 0000  Activity as tolerated     08/16/17 0927   08/16/17 0000  Lifting restrictions    Comments:  Weight restriction of 15 lbs.   08/16/17 0927   08/16/17 0000  Driving restriction     Comments:  Avoid driving for at least 1-2 weeks and while taking narcotic pain medication   08/16/17 0927   08/16/17 0000  Sexual acrtivity    Comments:  None for 6 weeks   08/16/17 8638      Diet: routine diet  Activity: Advance as tolerated. Pelvic rest for 6 weeks.   Outpatient follow up:2 weeks Follow up Appt:Future Appointments Date Time Provider [place]  10/04/2017 10:00 AM Nandigam, Venia Minks, MD [facility]   Follow up Visit:No Follow-up on file.  Postpartum contraception: Not Discussed  Newborn Data: Live born female  Birth Weight: 8 lb 2.9 oz (3710 g) APGAR: 8, 9  Baby Feeding: Bottle and Breast Disposition:home with mother   08/16/2017 Isaiah Serge, DO

## 2017-08-17 ENCOUNTER — Encounter (HOSPITAL_COMMUNITY): Payer: Self-pay | Admitting: Obstetrics and Gynecology

## 2017-10-04 ENCOUNTER — Ambulatory Visit: Payer: Self-pay | Admitting: Gastroenterology

## 2017-11-20 HISTORY — PX: COLONOSCOPY: SHX174

## 2017-12-26 ENCOUNTER — Other Ambulatory Visit: Payer: Self-pay | Admitting: Gastroenterology

## 2017-12-26 NOTE — Telephone Encounter (Signed)
Dr Silverio Decamp please advise on patients Humira , she has not been seen since 2017 and needs refills  Can I send in the refills anyway

## 2017-12-26 NOTE — Telephone Encounter (Signed)
Okay to send refills, please schedule office visit soon

## 2017-12-27 NOTE — Telephone Encounter (Signed)
Med sent  Pt scheduled for follow up on 4/9

## 2018-02-06 ENCOUNTER — Other Ambulatory Visit: Payer: Self-pay

## 2018-02-06 ENCOUNTER — Encounter (HOSPITAL_COMMUNITY): Payer: Self-pay | Admitting: Emergency Medicine

## 2018-02-06 ENCOUNTER — Ambulatory Visit (HOSPITAL_COMMUNITY)
Admission: EM | Admit: 2018-02-06 | Discharge: 2018-02-06 | Disposition: A | Discharge status: Home / Self Care | Payer: Medicaid Other | Attending: Family Medicine | Admitting: Family Medicine

## 2018-02-06 DIAGNOSIS — K529 Noninfective gastroenteritis and colitis, unspecified: Secondary | ICD-10-CM | POA: Diagnosis not present

## 2018-02-06 MED ORDER — ONDANSETRON 4 MG PO TBDP: 4.0000 mg | ORAL_TABLET | Freq: Three times a day (TID) | ORAL | 0 refills | Status: DC | PRN

## 2018-02-06 NOTE — ED Triage Notes (Signed)
Pt. Stated, I got food poison , my stomach hurts, I've had N/V/D since last night.  Today Ive threw 5 times.diarrhea more than 5

## 2018-02-06 NOTE — Discharge Instructions (Signed)
Please do your best to ensure adequate fluid intake in order to avoid dehydration. If you find that you are unable to tolerate drinking fluids regularly please proceed to the Emergency Department for evaluation.  Also, you should return to the hospital if you experience persistent fevers for greater than 1-2 more days, increasing abdominal pain that persists despite medications, persistent diarrhea, dizziness, syncope (fainting), or for any other concerns you may deem worrisome.

## 2018-02-06 NOTE — ED Provider Notes (Signed)
Tees Toh   [id number] 02/06/18 Arrival Time: 1602  ASSESSMENT & PLAN:  1. Gastroenteritis    Meds ordered this encounter  Medications  . ondansetron (ZOFRAN-ODT) 4 MG disintegrating tablet    Sig: Take 1 tablet (4 mg total) by mouth every 8 (eight) hours as needed for nausea or vomiting.    Dispense:  15 tablet    Refill:  0   Discussed typical duration of symptoms for suspected viral GI illness. Will do her best to ensure adequate fluid intake in order to avoid dehydration. Will proceed to the Emergency Department for evaluation if unable to tolerate PO fluids regularly.  Otherwise she will f/u with his PCP or here if not showing improvement over the next 48-72 hours.  Reviewed expectations re: course of current medical issues. Questions answered. Outlined signs and symptoms indicating need for more acute intervention. Patient verbalized understanding. After Visit Summary given.   SUBJECTIVE: History from: patient.  Joyce Solomon is a 32 y.o. female who presents with complaint of non-bloody intermittent nausea and vomiting of brown material with diarrhea. Onset abrupt, yesterday. Abdominal discomfort: mild and cramping. Symptoms are unchanged since beginning. Aggravating factors: eating. Alleviating factors: none. Associated symptoms: fatigue. She denies fever. Appetite: decreased. PO intake: decreased. Ambulatory without assistance. Urinary symptoms: none. Last bowel movement today without blood. Her boyfriend sick with the same symptoms. OTC treatment: none.  Patient's last menstrual period was 01/09/2018.  Past Surgical History:  Procedure Laterality Date  . CESAREAN SECTION N/A 12/09/2013   Procedure: Primary Cesarean Section Delivery Baby Girl @ 1959, Apgars 8/8;  Surgeon: Logan Bores, MD;  Location: [facility];  Service: Obstetrics;  Laterality: N/A;  . CESAREAN SECTION N/A 08/14/2017   Procedure: CESAREAN SECTION;  Surgeon: Paula Compton, MD;   Location: [facility];  Service: Obstetrics;  Laterality: N/A;  . COLONOSCOPY  01/19/2011   Dr Penelope Coop. no active UC  . COLONOSCOPY W/ BIOPSIES  04/2014   Dr Deatra Ina. Left sided colitis from prox descending to rectum. iNo inflammation at and proximal to splenic flexure.   [name] DILATION AND EVACUATION  08/21/2012   Procedure: DILATATION AND EVACUATION;  Surgeon: Logan Bores, MD;  Location: [facility];  Service: Gynecology;  Laterality: N/A;  . TONSILLECTOMY      ROS: As per HPI.  OBJECTIVE:  Vitals:   02/06/18 1720 02/06/18 1721  BP: 122/68   Pulse: 90   Resp: 17   Temp: 98.3 F (36.8 C)   TempSrc: Oral   SpO2: 100%   Weight:  130 lb (59 kg)  Height:  4' 11"  (1.499 m)    General appearance: alert; no distress Oropharynx: moist Lungs: clear to auscultation bilaterally Heart: regular rate and rhythm Abdomen: soft; non-distended; no significant abdominal tenderness, "just a cramping feeling"; bowel sounds present; no masses or organomegaly; no guarding or rebound tenderness Back: no CVA tenderness Extremities: no edema; symmetrical with no gross deformities Skin: warm and dry Neurologic: normal gait Psychological: alert and cooperative; normal mood and affect   Allergies  Allergen Reactions  . Latex Rash  . Remicade [Infliximab] Itching                                               Past Medical History:  Diagnosis Date  . Allergy    SEASONAL  . Anemia 2008   Iron  deficiency.  s/p transfusions 2009, 2010. Inconsistent compliance with po Iron.   . Chronic ulcerative colitis ([place]) 2007  . Headache(784.0)   . Non compliance w medication regimen 06/2015   stopped Remicade, may not have been taking Azulfidine.   Social History   Socioeconomic History  . Marital status: Single    Spouse name: Not on file  . Number of children: 2  . Years of education: Not on file  . Highest education level: Not on file  Social Needs  . Financial resource strain: Not on file  .  Food insecurity - worry: Not on file  . Food insecurity - inability: Not on file  . Transportation needs - medical: Not on file  . Transportation needs - non-medical: Not on file  Occupational History  . Occupation: unemployed  Tobacco Use  . Smoking status: Former Smoker    Years: 3.00  . Smokeless tobacco: Never Used  Substance and Sexual Activity  . Alcohol use: No    Alcohol/week: 0.0 oz  . Drug use: No  . Sexual activity: Yes    Birth control/protection: None  Other Topics Concern  . Not on file  Social History Narrative  . Not on file   Family History  Problem Relation Age of Onset  . Miscarriages / Stillbirths Unknown   . Hypotension Neg Hx   . Alcohol abuse Neg Hx   . Arthritis Neg Hx   . Asthma Neg Hx   . Birth defects Neg Hx   . COPD Neg Hx   . Depression Neg Hx   . Diabetes Neg Hx   . Drug abuse Neg Hx   . Hearing loss Neg Hx   . Heart disease Neg Hx   . Hyperlipidemia Neg Hx   . Hypertension Neg Hx   . Kidney disease Neg Hx   . Learning disabilities Neg Hx   . Mental illness Neg Hx   . Mental retardation Neg Hx   . Colon cancer Neg Hx   . Colon polyps Neg Hx   . Esophageal cancer Neg Hx   . Stomach cancer Neg Hx   . Rectal cancer Neg Hx      Vanessa Kick, MD 02/07/18 [id number]

## 2018-02-07 ENCOUNTER — Other Ambulatory Visit: Payer: Self-pay | Admitting: Gastroenterology

## 2018-02-26 ENCOUNTER — Ambulatory Visit: Payer: Medicaid Other | Admitting: Gastroenterology

## 2018-03-13 ENCOUNTER — Other Ambulatory Visit: Payer: Self-pay | Admitting: Gastroenterology

## 2018-03-13 NOTE — Telephone Encounter (Signed)
If she continues to be noncompliant we will have to discharge patient from the practice.  Okay to schedule with an APP

## 2018-03-13 NOTE — Telephone Encounter (Signed)
Patient's pharmacy is requesting a refill for the patient. She was a no show for her follow up appointment 02/26/18. She is due lab, TB screening and office visit.  I left her a message to call asap for an appointment. May I schedule her with an APP?

## 2018-03-15 ENCOUNTER — Encounter: Payer: Self-pay | Admitting: Nurse Practitioner

## 2018-03-18 ENCOUNTER — Encounter: Payer: Self-pay | Admitting: Nurse Practitioner

## 2018-03-21 ENCOUNTER — Other Ambulatory Visit: Payer: Medicaid Other

## 2018-03-21 ENCOUNTER — Ambulatory Visit: Payer: Medicaid Other | Admitting: Nurse Practitioner

## 2018-03-21 ENCOUNTER — Other Ambulatory Visit (INDEPENDENT_AMBULATORY_CARE_PROVIDER_SITE_OTHER): Payer: Medicaid Other

## 2018-03-21 ENCOUNTER — Ambulatory Visit (INDEPENDENT_AMBULATORY_CARE_PROVIDER_SITE_OTHER): Payer: Medicaid Other | Admitting: Gastroenterology

## 2018-03-21 ENCOUNTER — Encounter: Payer: Self-pay | Admitting: Gastroenterology

## 2018-03-21 VITALS — BP 122/80 | HR 62 | Ht 59.0 in | Wt 140.6 lb

## 2018-03-21 DIAGNOSIS — K5190 Ulcerative colitis, unspecified, without complications: Secondary | ICD-10-CM | POA: Diagnosis not present

## 2018-03-21 DIAGNOSIS — K5130 Ulcerative (chronic) rectosigmoiditis without complications: Secondary | ICD-10-CM

## 2018-03-21 LAB — COMPREHENSIVE METABOLIC PANEL
ALT: 11 U/L (ref 0–35)
AST: 13 U/L (ref 0–37)
Albumin: 4.3 g/dL (ref 3.5–5.2)
Alkaline Phosphatase: 62 U/L (ref 39–117)
BUN: 12 mg/dL (ref 6–23)
CO2: 26 meq/L (ref 19–32)
Calcium: 9.2 mg/dL (ref 8.4–10.5)
Chloride: 106 mEq/L (ref 96–112)
Creatinine, Ser: 0.71 mg/dL (ref 0.40–1.20)
GFR: 122.42 mL/min (ref >60.00)
GLUCOSE: 83 mg/dL (ref 70–99)
POTASSIUM: 4.1 meq/L (ref 3.5–5.1)
SODIUM: 139 meq/L (ref 135–145)
Total Bilirubin: 0.3 mg/dL (ref 0.2–1.2)
Total Protein: 7.3 g/dL (ref 6.0–8.3)

## 2018-03-21 LAB — CBC WITH DIFFERENTIAL/PLATELET
BASOS PCT: 0.4 % (ref 0.0–3.0)
Basophils Absolute: 0 10*3/uL (ref 0.0–0.1)
EOS PCT: 1.4 % (ref 0.0–5.0)
Eosinophils Absolute: 0.1 10*3/uL (ref 0.0–0.7)
HCT: 44.1 % (ref 36.0–46.0)
Hemoglobin: 14.9 g/dL (ref 12.0–15.0)
LYMPHS ABS: 3.6 10*3/uL (ref 0.7–4.0)
Lymphocytes Relative: 38.8 % (ref 12.0–46.0)
MCHC: 33.8 g/dL (ref 30.0–36.0)
MCV: 95.1 fl (ref 78.0–100.0)
MONOS PCT: 10.4 % (ref 3.0–12.0)
Monocytes Absolute: 1 10*3/uL (ref 0.1–1.0)
NEUTROS PCT: 49 % (ref 43.0–77.0)
Neutro Abs: 4.6 10*3/uL (ref 1.4–7.7)
PLATELETS: 196 10*3/uL (ref 150.0–400.0)
RBC: 4.64 Mil/uL (ref 3.87–5.11)
RDW: 14.7 % (ref 11.5–15.5)
WBC: 9.4 10*3/uL (ref 4.0–10.5)

## 2018-03-21 LAB — SEDIMENTATION RATE: SED RATE: 10 mm/h (ref 0–20)

## 2018-03-21 LAB — C-REACTIVE PROTEIN: CRP: 0.1 mg/dL — ABNORMAL LOW (ref 0.5–20.0)

## 2018-03-21 LAB — FERRITIN: Ferritin: 21.5 ng/mL (ref 10.0–291.0)

## 2018-03-21 LAB — FOLATE: FOLATE: 21 ng/mL (ref >5.9)

## 2018-03-21 LAB — VITAMIN B12: VITAMIN B 12: 536 pg/mL (ref 211–911)

## 2018-03-21 MED ORDER — SUPREP BOWEL PREP KIT 17.5-3.13-1.6 GM/177ML PO SOLN: 1.0000 | ORAL | 0 refills | Status: DC

## 2018-03-21 NOTE — Patient Instructions (Signed)
If you are age 33 or older, your body mass index should be between 23-30. Your Body mass index is 28.4 kg/m. If this is out of the aforementioned range listed, please consider follow up with your Primary Care Provider.  If you are age 85 or younger, your body mass index should be between 19-25. Your Body mass index is 28.4 kg/m. If this is out of the aformentioned range listed, please consider follow up with your Primary Care Provider.   Your provider has requested that you go to the basement level for lab work before leaving today. Press "B" on the elevator. The lab is located at the first door on the left as you exit the elevator.  We have sent the following medications to your pharmacy for you to pick up at your convenience: [place] have been scheduled for a colonoscopy. Please follow written instructions given to you at your visit today.  Please pick up your prep supplies at the pharmacy within the next 1-3 days. If you use inhalers (even only as needed), please bring them with you on the day of your procedure. Your physician has requested that you go to www.startemmi.com and enter the access code given to you at your visit today. This web site gives a general overview about your procedure. However, you should still follow specific instructions given to you by our office regarding your preparation for the procedure.  Thank you for choosing [facility]  Dr Silverio Decamp

## 2018-03-21 NOTE — Progress Notes (Signed)
Joyce Solomon    [id number]    [birth year]  Primary Care Physician:Avbuere, Christean Grief, MD  Referring Physician: [facility] [address] [place], [place] [address]  Chief complaint:  Ulcerative colitis  HPI:  32 year old female with history of ulcerative colitis currently in clinical remission on Humira here for follow-up visit.  She is having 1-2 formed bowel movements daily with no rectal bleeding.Denies any nausea, vomiting, abdominal pain, melena or bright red blood per rectum Overall feeling well and has no complaints   Outpatient Encounter Medications as of 03/21/2018  Medication Sig  . acetaminophen (TYLENOL) 325 MG tablet Take 650 mg by mouth every 6 (six) hours as needed for moderate pain.   [name] HUMIRA PEN 40 MG/0.8ML PNKT INJECT 40MG INTO THE SKIN EVERY FOURTEEN DAYS  . ibuprofen (ADVIL,MOTRIN) 600 MG tablet Take 1 tablet (600 mg total) by mouth every 6 (six) hours as needed for moderate pain or cramping.  . Prenatal Vit-Fe Fumarate-FA (PRENATAL VITAMIN) 27-0.8 MG TABS TAKE 1 TABLET BY MOUTH DAILY  . [DISCONTINUED] cephALEXin (KEFLEX) 500 MG capsule Take 1 capsule (500 mg total) by mouth 4 (four) times daily. (Patient not taking: Reported on 08/14/2017)  . [DISCONTINUED] ondansetron (ZOFRAN-ODT) 4 MG disintegrating tablet Take 1 tablet (4 mg total) by mouth every 8 (eight) hours as needed for nausea or vomiting. (Patient not taking: Reported on 03/21/2018)   No facility-administered encounter medications on file as of 03/21/2018.     Allergies as of 03/21/2018 - Review Complete 03/21/2018  Allergen Reaction Noted  . Latex Rash 05/20/2009  . Remicade [infliximab] Itching 08/07/2015    Past Medical History:  Diagnosis Date  . Allergy    SEASONAL  . Anemia 2008   Iron deficiency.  s/p transfusions 2009, 2010. Inconsistent compliance with po Iron.   . Chronic ulcerative colitis ([place]) 2007  . Headache(784.0)   . Non compliance w medication regimen  06/2015   stopped Remicade, may not have been taking Azulfidine.    Past Surgical History:  Procedure Laterality Date  . CESAREAN SECTION N/A 12/09/2013   Procedure: Primary Cesarean Section Delivery Baby Girl @ 1959, Apgars 8/8;  Surgeon: Logan Bores, MD;  Location: [facility];  Service: Obstetrics;  Laterality: N/A;  . CESAREAN SECTION N/A 08/14/2017   Procedure: CESAREAN SECTION;  Surgeon: Paula Compton, MD;  Location: [facility];  Service: Obstetrics;  Laterality: N/A;  . COLONOSCOPY  01/19/2011   Dr Penelope Coop. no active UC  . COLONOSCOPY W/ BIOPSIES  04/2014   Dr Deatra Ina. Left sided colitis from prox descending to rectum. iNo inflammation at and proximal to splenic flexure.   [name] DILATION AND EVACUATION  08/21/2012   Procedure: DILATATION AND EVACUATION;  Surgeon: Logan Bores, MD;  Location: [facility];  Service: Gynecology;  Laterality: N/A;  . TONSILLECTOMY      Family History  Problem Relation Age of Onset  . Miscarriages / Stillbirths Unknown   . Hypotension Neg Hx   . Alcohol abuse Neg Hx   . Arthritis Neg Hx   . Asthma Neg Hx   . Birth defects Neg Hx   . COPD Neg Hx   . Depression Neg Hx   . Diabetes Neg Hx   . Drug abuse Neg Hx   . Hearing loss Neg Hx   . Heart disease Neg Hx   . Hyperlipidemia Neg Hx   . Hypertension Neg Hx   . Kidney disease Neg  Hx   . Learning disabilities Neg Hx   . Mental illness Neg Hx   . Mental retardation Neg Hx   . Colon cancer Neg Hx   . Colon polyps Neg Hx   . Esophageal cancer Neg Hx   . Stomach cancer Neg Hx   . Rectal cancer Neg Hx     Social History   Socioeconomic History  . Marital status: Single    Spouse name: Not on file  . Number of children: 2  . Years of education: Not on file  . Highest education level: Not on file  Occupational History  . Occupation: unemployed  Social Needs  . Financial resource strain: Not on file  . Food insecurity:    Worry: Not on file    Inability: Not on file  .  Transportation needs:    Medical: Not on file    Non-medical: Not on file  Tobacco Use  . Smoking status: Former Smoker    Years: 3.00  . Smokeless tobacco: Never Used  Substance and Sexual Activity  . Alcohol use: No    Alcohol/week: 0.0 oz  . Drug use: No  . Sexual activity: Yes    Birth control/protection: None  Lifestyle  . Physical activity:    Days per week: Not on file    Minutes per session: Not on file  . Stress: Not on file  Relationships  . Social connections:    Talks on phone: Not on file    Gets together: Not on file    Attends religious service: Not on file    Active member of club or organization: Not on file    Attends meetings of clubs or organizations: Not on file    Relationship status: Not on file  . Intimate partner violence:    Fear of current or ex partner: Not on file    Emotionally abused: Not on file    Physically abused: Not on file    Forced sexual activity: Not on file  Other Topics Concern  . Not on file  Social History Narrative  . Not on file      Review of systems: Review of Systems  Constitutional: Negative for fever and chills.  HENT: Negative.   Eyes: Negative for blurred vision.  Respiratory: Negative for cough, shortness of breath and wheezing.   Cardiovascular: Negative for chest pain and palpitations.  Gastrointestinal: as per HPI Genitourinary: Negative for dysuria, urgency, frequency and hematuria.  Musculoskeletal: Negative for myalgias, back pain and joint pain.  Skin: Negative for itching and rash.  Neurological: Negative for dizziness, tremors, focal weakness, seizures and loss of consciousness.  Endo/Heme/Allergies: Positive for seasonal allergies.  Psychiatric/Behavioral: Negative for depression, suicidal ideas and hallucinations.  All other systems reviewed and are negative.   Physical Exam: Vitals:   03/21/18 1031  BP: 122/80  Pulse: 62   Body mass index is 28.4 kg/m. Gen:      No acute distress HEENT:   EOMI, sclera anicteric Neck:     No masses; no thyromegaly Lungs:    Clear to auscultation bilaterally; normal respiratory effort CV:         Regular rate and rhythm; no murmurs Abd:      + bowel sounds; soft, non-tender; no palpable masses, no distension Ext:    No edema; adequate peripheral perfusion Skin:      Warm and dry; no rash Neuro: alert and oriented x 3 Psych: normal mood and affect  Data Reviewed:  Reviewed labs, radiology imaging, old records and pertinent past GI work up   Assessment and Plan/Recommendations:  32 year old female with left-sided ulcerative colitis diagnosed in 2008, initiated on Humira March 2017 in clinical remission here for follow-up visit Due for annual TB Follow-up CRP, CBC, CMP, ferritin, B12 and folate Last Colonoscopy -June 2015 and last flexible sigmoidoscopy January 2017  Due for surveillance colonoscopy in 2022  15 minutes was spent face-to-face with the patient. Greater than 50% of the time used for counseling as well as treatment plan and follow-up. She had multiple questions which were answered to her satisfaction  K. Denzil Magnuson , MD [phone]    CC: [facility]

## 2018-03-23 LAB — QUANTIFERON-TB GOLD PLUS
Mitogen-NIL: >10 IU/mL
NIL: 0.03 [IU]/mL
QUANTIFERON-TB GOLD PLUS: NEGATIVE
TB1-NIL: 0 IU/mL
TB2-NIL: 0 [IU]/mL

## 2018-03-26 ENCOUNTER — Encounter: Payer: Self-pay | Admitting: Gastroenterology

## 2018-04-01 ENCOUNTER — Ambulatory Visit: Payer: Medicaid Other | Admitting: Nurse Practitioner

## 2018-04-04 ENCOUNTER — Other Ambulatory Visit: Payer: Self-pay | Admitting: Gastroenterology

## 2018-04-04 ENCOUNTER — Ambulatory Visit: Payer: Medicaid Other | Admitting: Nurse Practitioner

## 2018-04-05 ENCOUNTER — Telehealth: Payer: Self-pay | Admitting: Gastroenterology

## 2018-04-05 MED ORDER — ADALIMUMAB 40 MG/0.4ML ~~LOC~~ AJKT: 40.0000 mg | AUTO-INJECTOR | SUBCUTANEOUS | 6 refills | Status: DC

## 2018-04-05 NOTE — Telephone Encounter (Signed)
Citrate free rx sent

## 2018-04-07 ENCOUNTER — Encounter (HOSPITAL_COMMUNITY): Payer: Self-pay

## 2018-04-07 ENCOUNTER — Other Ambulatory Visit: Payer: Self-pay

## 2018-04-07 ENCOUNTER — Ambulatory Visit (HOSPITAL_COMMUNITY)
Admission: EM | Admit: 2018-04-07 | Discharge: 2018-04-07 | Disposition: A | Discharge status: Home / Self Care | Payer: Medicaid Other | Attending: Family Medicine | Admitting: Family Medicine

## 2018-04-07 DIAGNOSIS — R1084 Generalized abdominal pain: Secondary | ICD-10-CM | POA: Diagnosis not present

## 2018-04-07 DIAGNOSIS — Z3202 Encounter for pregnancy test, result negative: Secondary | ICD-10-CM | POA: Diagnosis not present

## 2018-04-07 DIAGNOSIS — R519 Headache, unspecified: Secondary | ICD-10-CM

## 2018-04-07 DIAGNOSIS — R51 Headache: Secondary | ICD-10-CM | POA: Diagnosis not present

## 2018-04-07 DIAGNOSIS — R110 Nausea: Secondary | ICD-10-CM

## 2018-04-07 LAB — POCT URINALYSIS DIP (DEVICE)
BILIRUBIN URINE: NEGATIVE
Glucose, UA: NEGATIVE mg/dL
HGB URINE DIPSTICK: NEGATIVE
Leukocytes, UA: NEGATIVE
Nitrite: NEGATIVE
PH: 6 (ref 5.0–8.0)
Protein, ur: NEGATIVE mg/dL
Specific Gravity, Urine: 1.025 (ref 1.005–1.030)
Urobilinogen, UA: 0.2 mg/dL (ref 0.0–1.0)

## 2018-04-07 LAB — POCT PREGNANCY, URINE: Preg Test, Ur: NEGATIVE

## 2018-04-07 MED ORDER — OMEPRAZOLE 40 MG PO CPDR: 40.0000 mg | DELAYED_RELEASE_CAPSULE | Freq: Every day | ORAL | 0 refills | Status: DC

## 2018-04-07 MED ORDER — BUTALBITAL-APAP-CAFFEINE 50-325-40 MG PO TABS: 1.0000 | ORAL_TABLET | Freq: Four times a day (QID) | ORAL | 0 refills | Status: DC | PRN

## 2018-04-07 NOTE — Discharge Instructions (Signed)
Take omeprazole once a day. To take it on empty stomach. It will reduce stomach acid and help with your stomach pain. Take the headache pill as needed for headache. Take 1 or 2, and then lie down to rest. This can make you drowsy. Follow-up with your PCP

## 2018-04-07 NOTE — ED Notes (Signed)
Pt discharged by provider.

## 2018-04-07 NOTE — ED Triage Notes (Signed)
Pt presents to [facility] for generalized abdominal cramping x1 week, pt also complains of headache, pt has taken OTC medication but has no relief

## 2018-04-07 NOTE — ED Provider Notes (Signed)
[place]    CSN: [id number] Arrival date & time: 04/07/18  [place]     History   Chief Complaint Chief Complaint  Patient presents with  . Abdominal Pain    HPI Joyce Solomon is a 32 y.o. female.   HPI  Patient is here for abdominal pain.  She states is been present for a week.  Crampy pain that comes and goes.  Is made better with eating.  Does not awaken her at night.  Not related to position.  No nausea or vomiting.  No fever or chills.  No diarrhea or blood in bowels.  History of ulcerative colitis.  Ulcerative colitis is currently well controlled.  She uses  Humira.  Under care of GI.  Scheduled for procedure next week. Also complains of headache.  Also every day for about a week.  No visual symptoms, no nausea.  No history of migraines.  She states is more behind her right eye than left.  No trauma.  No fever or chills.  No sinus symptoms or allergies.  Past Medical History:  Diagnosis Date  . Allergy    SEASONAL  . Anemia 2008   Iron deficiency.  s/p transfusions 2009, 2010. Inconsistent compliance with po Iron.   . Chronic ulcerative colitis ([place]) 2007  . Headache(784.0)   . Non compliance w medication regimen 06/2015   stopped Remicade, may not have been taking Azulfidine.    Patient Active Problem List   Diagnosis Date Noted  . S/P repeat low transverse C-section 08/15/2017  . Term pregnancy 08/14/2017  . Rectal bleeding 04/23/2014  . Status post C-section 12/10/2013  . IUGR (intrauterine growth restriction) 12/09/2013  . DENTAL CARIES 12/22/2009  . HYPERTHYROIDISM 08/03/2008  . CONSTIPATION 07/29/2008  . ACNE, MILD 07/29/2008  . ANEMIA 06/30/2008  . Ulcerative colitis ([place]) 11/20/2005    Past Surgical History:  Procedure Laterality Date  . CESAREAN SECTION N/A 12/09/2013   Procedure: Primary Cesarean Section Delivery Baby Girl @ 1959, Apgars 8/8;  Surgeon: Logan Bores, MD;  Location: [facility];  Service: Obstetrics;  Laterality: N/A;    . CESAREAN SECTION N/A 08/14/2017   Procedure: CESAREAN SECTION;  Surgeon: Paula Compton, MD;  Location: [facility];  Service: Obstetrics;  Laterality: N/A;  . COLONOSCOPY  01/19/2011   Dr Penelope Coop. no active UC  . COLONOSCOPY W/ BIOPSIES  04/2014   Dr Deatra Ina. Left sided colitis from prox descending to rectum. iNo inflammation at and proximal to splenic flexure.   [name] DILATION AND EVACUATION  08/21/2012   Procedure: DILATATION AND EVACUATION;  Surgeon: Logan Bores, MD;  Location: [facility];  Service: Gynecology;  Laterality: N/A;  . TONSILLECTOMY      OB History    Gravida  5   Para  3   Term  3   Preterm      AB  2   Living  3     SAB  1   TAB  1   Ectopic      Multiple  0   Live Births  3            Home Medications    Prior to Admission medications   Medication Sig Start Date End Date Taking? Authorizing Provider  Adalimumab (HUMIRA PEN) 40 MG/0.4ML PNKT Inject 40 mg into the skin every 14 (fourteen) days. 04/05/18  Yes Nandigam, Venia Minks, MD  acetaminophen (TYLENOL) 325 MG tablet Take 650 mg by mouth every 6 (six) hours  as needed for moderate pain.     [provider]  butalbital-acetaminophen-caffeine Emelda Brothers, ESGIC) [phone] MG tablet Take 1-2 tablets by mouth every 6 (six) hours as needed for headache. 04/07/18 04/07/19  Raylene Everts, MD  ibuprofen (ADVIL,MOTRIN) 600 MG tablet Take 1 tablet (600 mg total) by mouth every 6 (six) hours as needed for moderate pain or cramping. 08/16/17   Banga, Bonnee Quin, DO  omeprazole (PRILOSEC) 40 MG capsule Take 1 capsule (40 mg total) by mouth daily. 04/07/18   Raylene Everts, MD  Prenatal Vit-Fe Fumarate-FA (PRENATAL VITAMIN) 27-0.8 MG TABS TAKE 1 TABLET BY MOUTH DAILY    [provider]  SUPREP BOWEL PREP KIT 17.5-3.13-1.6 GM/177ML SOLN Take 1 kit by mouth as directed. 03/21/18   Mauri Pole, MD    Family History Family History  Problem Relation Age of Onset  .  Miscarriages / Stillbirths Unknown   . Hypotension Neg Hx   . Alcohol abuse Neg Hx   . Arthritis Neg Hx   . Asthma Neg Hx   . Birth defects Neg Hx   . COPD Neg Hx   . Depression Neg Hx   . Diabetes Neg Hx   . Drug abuse Neg Hx   . Hearing loss Neg Hx   . Heart disease Neg Hx   . Hyperlipidemia Neg Hx   . Hypertension Neg Hx   . Kidney disease Neg Hx   . Learning disabilities Neg Hx   . Mental illness Neg Hx   . Mental retardation Neg Hx   . Colon cancer Neg Hx   . Colon polyps Neg Hx   . Esophageal cancer Neg Hx   . Stomach cancer Neg Hx   . Rectal cancer Neg Hx     Social History Social History   Tobacco Use  . Smoking status: Former Smoker    Years: 3.00  . Smokeless tobacco: Never Used  Substance Use Topics  . Alcohol use: No    Alcohol/week: 0.0 oz  . Drug use: No     Allergies   Latex and Remicade [infliximab]   Review of Systems Review of Systems  Constitutional: Negative for chills, fatigue and fever.  HENT: Negative for congestion and dental problem.   Eyes: Negative for pain and visual disturbance.  Respiratory: Negative for cough and shortness of breath.   Cardiovascular: Negative for chest pain and palpitations.  Gastrointestinal: Positive for abdominal pain. Negative for blood in stool, diarrhea, nausea and vomiting.  Genitourinary: Negative for dysuria and hematuria.  Musculoskeletal: Negative for arthralgias and back pain.  Skin: Negative for color change and rash.  Neurological: Positive for headaches. Negative for seizures and syncope.  All other systems reviewed and are negative.    Physical Exam Triage Vital Signs ED Triage Vitals  Enc Vitals Group     BP 04/07/18 1827 110/62     Pulse Rate 04/07/18 1827 74     Resp 04/07/18 1827 16     Temp 04/07/18 1827 98.1 F (36.7 C)     Temp Source 04/07/18 1827 Oral     SpO2 04/07/18 1827 100 %     Weight --      Height --      Head Circumference --      Peak Flow --      Pain Score  04/07/18 1828 8     Pain Loc --      Pain Edu? --      Excl. in  GC? --    No data found.  Updated Vital Signs BP 110/62 (BP Location: Left Arm)   Pulse 74   Temp 98.1 F (36.7 C) (Oral)   Resp 16   SpO2 100%   Visual Acuity Right Eye Distance:   Left Eye Distance:   Bilateral Distance:    Right Eye Near:   Left Eye Near:    Bilateral Near:     Physical Exam  Constitutional: She appears well-developed and well-nourished.  Non-toxic appearance. She does not appear ill. No distress.  HENT:  Head: Normocephalic and atraumatic.  Mouth/Throat: Oropharynx is clear and moist.  Eyes: Conjunctivae are normal.  Neck: Neck supple.  Cardiovascular: Normal rate and regular rhythm.  No murmur heard. Pulmonary/Chest: Effort normal and breath sounds normal. No respiratory distress.  Abdominal: Soft. Normal appearance and bowel sounds are normal. There is no tenderness.  Musculoskeletal: She exhibits no edema.  Neurological: She is alert.  Skin: Skin is warm and dry.  Psychiatric: She has a normal mood and affect.  Nursing note and vitals reviewed.    UC Treatments / Results  Labs (all labs ordered are listed, but only abnormal results are displayed) Labs Reviewed  POCT URINALYSIS DIP (DEVICE) - Abnormal; Notable for the following components:      Result Value   Ketones, ur TRACE (*)    All other components within normal limits  POCT PREGNANCY, URINE    EKG None  Radiology No results found.  Procedures Procedures (including critical care time)  Medications Ordered in UC Medications - No data to display  Initial Impression / Assessment and Plan / UC Course  I have reviewed the triage vital signs and the nursing notes.  Pertinent labs & imaging results that were available during my care of the patient were reviewed by me and considered in my medical decision making (see chart for details).     Abdominal pain that comes and goes, and is made better by eating as  often from acid reflux or stomach etiology.  I am going to give her a trial of omeprazole.  Her headaches are likely stress related.  No evidence of sinus headache.  No evidence of migraine.  I will give her a limited number of Fioricet.  She needs to follow-up with the PCP. Final Clinical Impressions(s) / UC Diagnoses   Final diagnoses:  Generalized abdominal pain  Nausea  Nonintractable headache, unspecified chronicity pattern, unspecified headache type     Discharge Instructions     Take omeprazole once a day. To take it on empty stomach. It will reduce stomach acid and help with your stomach pain. Take the headache pill as needed for headache. Take 1 or 2, and then lie down to rest. This can make you drowsy. Follow-up with your PCP   ED Prescriptions    Medication Sig Dispense Auth. Provider   butalbital-acetaminophen-caffeine (FIORICET, ESGIC) 50-325-40 MG tablet Take 1-2 tablets by mouth every 6 (six) hours as needed for headache. 20 tablet Raylene Everts, MD   omeprazole (PRILOSEC) 40 MG capsule Take 1 capsule (40 mg total) by mouth daily. 30 capsule Raylene Everts, MD     Controlled Substance Prescriptions [place] Controlled Substance Registry consulted? Not Applicable   Raylene Everts, MD 04/07/18 2330

## 2018-04-10 ENCOUNTER — Ambulatory Visit (AMBULATORY_SURGERY_CENTER): Payer: Medicaid Other | Admitting: Gastroenterology

## 2018-04-10 ENCOUNTER — Other Ambulatory Visit: Payer: Self-pay

## 2018-04-10 ENCOUNTER — Encounter: Payer: Self-pay | Admitting: Gastroenterology

## 2018-04-10 VITALS — BP 130/75 | HR 68 | Temp 98.4°F | Resp 10 | Ht 59.0 in | Wt 140.0 lb

## 2018-04-10 DIAGNOSIS — K529 Noninfective gastroenteritis and colitis, unspecified: Secondary | ICD-10-CM | POA: Diagnosis not present

## 2018-04-10 DIAGNOSIS — K5130 Ulcerative (chronic) rectosigmoiditis without complications: Secondary | ICD-10-CM

## 2018-04-10 MED ORDER — SODIUM CHLORIDE 0.9 % IV SOLN: 500.0000 mL | Freq: Once | INTRAVENOUS | Status: DC

## 2018-04-10 NOTE — Progress Notes (Signed)
Called to room to assist during endoscopic procedure.  Patient ID and intended procedure confirmed with present staff. Received instructions for my participation in the procedure from the performing physician.  

## 2018-04-10 NOTE — Progress Notes (Signed)
Pt reported brown liquid last result of prep.  I explained to [facility] Monday, CRNA.  Proceed with colonoscopy per [facility] Monday, CRNA.

## 2018-04-10 NOTE — Progress Notes (Signed)
Report to PACU, RN, vss, BBS= Clear.  

## 2018-04-10 NOTE — Op Note (Signed)
[place] Patient Name: Joyce Solomon Procedure Date: 04/10/2018 3:04 PM MRN: [id number] Endoscopist: Mauri Pole , MD Age: 32 Referring MD:  Date of Birth: [birth year] Gender: Female Account #: [id number] Procedure:                Colonoscopy Indications:              High risk colon cancer surveillance: Ulcerative                            left sided colitis Medicines:                Monitored Anesthesia Care Procedure:                Pre-Anesthesia Assessment:                           - Prior to the procedure, a History and Physical                            was performed, and patient medications and                            allergies were reviewed. The patient's tolerance of                            previous anesthesia was also reviewed. The risks                            and benefits of the procedure and the sedation                            options and risks were discussed with the patient.                            All questions were answered, and informed consent                            was obtained. Prior Anticoagulants: The patient has                            taken no previous anticoagulant or antiplatelet                            agents. ASA Grade Assessment: II - A patient with                            mild systemic disease. After reviewing the risks                            and benefits, the patient was deemed in                            satisfactory condition to undergo the procedure.  After obtaining informed consent, the colonoscope                            was passed under direct vision. Throughout the                            procedure, the patient's blood pressure, pulse, and                            oxygen saturations were monitored continuously. The                            Colonoscope was introduced through the anus and                            advanced to the the terminal ileum,  with                            identification of the appendiceal orifice and IC                            valve. The colonoscopy was performed without                            difficulty. The patient tolerated the procedure                            well. The quality of the bowel preparation was                            excellent. The terminal ileum, ileocecal valve,                            appendiceal orifice, and rectum were photographed. Scope In: 3:09:31 PM Scope Out: 3:25:20 PM Scope Withdrawal Time: 0 hours 9 minutes 57 seconds  Total Procedure Duration: 0 hours 15 minutes 49 seconds  Findings:                 The perianal and digital rectal examinations were                            normal.                           Inflammation characterized by altered vascularity                            was found in a continuous and circumferential                            pattern from the anus to the splenic flexure. The                            proximal transverse colon, the hepatic flexure, the  ascending colon and the cecum were spared. This was                            graded as Mayo Score 1 (mild, with erythema,                            decreased vascular pattern, mild friability), and                            when compared to previous examinations, the                            findings are improved. Biopsies were taken with a                            cold forceps for histology.                           The terminal ileum appeared normal. Biopsies were                            taken with a cold forceps for histology.                           Scattered small-mouthed diverticula were found in                            the sigmoid colon, transverse colon and ascending                            colon.                           Non-bleeding internal hemorrhoids were found during                            retroflexion. The hemorrhoids  were small. Complications:            No immediate complications. Estimated Blood Loss:     Estimated blood loss was minimal. Impression:               - Left-sided colitis. Inflammation was found from                            the anus to the splenic flexure. This was graded as                            Mayo Score 1 (mild disease), improved compared to                            previous examinations. Biopsied.                           - The examined portion of the ileum was normal.  Biopsied.                           - Mild diverticulosis in the sigmoid colon, in the                            transverse colon and in the ascending colon.                           - Non-bleeding internal hemorrhoids. Recommendation:           - Patient has a contact number available for                            emergencies. The signs and symptoms of potential                            delayed complications were discussed with the                            patient. Return to normal activities tomorrow.                            Written discharge instructions were provided to the                            patient.                           - Resume previous diet.                           - Continue present medications.                           - Await pathology results.                           - Repeat colonoscopy in 2 years for surveillance                            based on pathology results.                           - Return to GI clinic in 6 months. Mauri Pole, MD 04/10/2018 3:32:37 PM This report has been signed electronically.

## 2018-04-10 NOTE — Patient Instructions (Signed)
Read handouts provided on hemorrhoids anddiverticulosis. Repeat colonoscopy in 2 years. Return to GI clinic in 6 months    YOU HAD AN ENDOSCOPIC PROCEDURE TODAY AT [facility]:   Refer to the procedure report that was given to you for any specific questions about what was found during the examination.  If the procedure report does not answer your questions, please call your gastroenterologist to clarify.  If you requested that your care partner not be given the details of your procedure findings, then the procedure report has been included in a sealed envelope for you to review at your convenience later.  YOU SHOULD EXPECT: Some feelings of bloating in the abdomen. Passage of more gas than usual.  Walking can help get rid of the air that was put into your GI tract during the procedure and reduce the bloating. If you had a lower endoscopy (such as a colonoscopy or flexible sigmoidoscopy) you may notice spotting of blood in your stool or on the toilet paper. If you underwent a bowel prep for your procedure, you may not have a normal bowel movement for a few days.  Please Note:  You might notice some irritation and congestion in your nose or some drainage.  This is from the oxygen used during your procedure.  There is no need for concern and it should clear up in a day or so.  SYMPTOMS TO REPORT IMMEDIATELY:   Following lower endoscopy (colonoscopy or flexible sigmoidoscopy):  Excessive amounts of blood in the stool  Significant tenderness or worsening of abdominal pains  Swelling of the abdomen that is new, acute  Fever of 100F or higher    For urgent or emergent issues, a gastroenterologist can be reached at any hour by calling [phone].   DIET:  We do recommend a small meal at first, but then you may proceed to your regular diet.  Drink plenty of fluids but you should avoid alcoholic beverages for 24 hours.  ACTIVITY:  You should plan to take it easy for the rest  of today and you should NOT DRIVE or use heavy machinery until tomorrow (because of the sedation medicines used during the test).    FOLLOW UP: Our staff will call the number listed on your records the next business day following your procedure to check on you and address any questions or concerns that you may have regarding the information given to you following your procedure. If we do not reach you, we will leave a message.  However, if you are feeling well and you are not experiencing any problems, there is no need to return our call.  We will assume that you have returned to your regular daily activities without incident.  If any biopsies were taken you will be contacted by phone or by letter within the next 1-3 weeks.  Please call us at [phone] if you have not heard about the biopsies in 3 weeks.    SIGNATURES/CONFIDENTIALITY: You and/or your care partner have signed paperwork which will be entered into your electronic medical record.  These signatures attest to the fact that that the information above on your After Visit Summary has been reviewed and is understood.  Full responsibility of the confidentiality of this discharge information lies with you and/or your care-partner.

## 2018-04-11 ENCOUNTER — Telehealth: Payer: Self-pay | Admitting: *Deleted

## 2018-04-11 ENCOUNTER — Telehealth: Payer: Self-pay

## 2018-04-11 NOTE — Telephone Encounter (Signed)
  Follow up Call-  Call back number 04/10/2018 12/17/2015  Post procedure Call Back phone  # [phone] cell [phone]  Permission to leave phone message Yes Yes  Some recent data might be hidden     Patient questions:  Message left to call us if necessary.

## 2018-04-11 NOTE — Telephone Encounter (Signed)
  Follow up Call-  Call back number 04/10/2018 12/17/2015  Post procedure Call Back phone  # [phone] cell [phone]  Permission to leave phone message Yes Yes  Some recent data might be hidden     Patient questions:  Message left to call us if necessary.  Second call.

## 2018-04-11 NOTE — Telephone Encounter (Signed)
  Follow up Call-  Call back number 04/10/2018 12/17/2015  Post procedure Call Back phone  # [phone] cell [phone]  Permission to leave phone message Yes Yes  Some recent data might be hidden     Number is not working; only number listed

## 2018-04-24 ENCOUNTER — Encounter: Payer: Self-pay | Admitting: Gastroenterology

## 2018-07-29 ENCOUNTER — Telehealth: Payer: Self-pay | Admitting: Gastroenterology

## 2018-07-29 MED ORDER — POLYETHYLENE GLYCOL 3350 17 GM/SCOOP PO POWD: ORAL | 3 refills | Status: DC

## 2018-07-29 NOTE — Telephone Encounter (Signed)
Dr Silverio Decamp, Please advise

## 2018-07-29 NOTE — Telephone Encounter (Signed)
Please send Rx for Miralax 1 capful daily as needed X 30 days with 11 refills. Thanks

## 2018-07-29 NOTE — Telephone Encounter (Signed)
Left message for patient to take miralax daily as needed for constipation

## 2018-07-29 NOTE — Addendum Note (Signed)
Addended by: Oda Kilts on: 07/29/2018 04:53 PM   Modules accepted: Orders

## 2018-08-03 ENCOUNTER — Encounter (HOSPITAL_COMMUNITY): Payer: Self-pay | Admitting: Emergency Medicine

## 2018-08-03 ENCOUNTER — Ambulatory Visit (HOSPITAL_COMMUNITY)
Admission: EM | Admit: 2018-08-03 | Discharge: 2018-08-03 | Disposition: A | Discharge status: Home / Self Care | Payer: Medicaid Other | Attending: Internal Medicine | Admitting: Internal Medicine

## 2018-08-03 DIAGNOSIS — L0291 Cutaneous abscess, unspecified: Secondary | ICD-10-CM | POA: Diagnosis not present

## 2018-08-03 MED ORDER — CEPHALEXIN 500 MG PO CAPS: 500.0000 mg | ORAL_CAPSULE | Freq: Four times a day (QID) | ORAL | 0 refills | Status: DC

## 2018-08-03 NOTE — ED Provider Notes (Signed)
[place]    CSN: [id number] Arrival date & time: 08/03/18  1055     History   Chief Complaint Chief Complaint  Patient presents with  . Mass    HPI Joyce Solomon is a 32 y.o. female.   32 year old female with history of ulcerative colitis on Humira comes in for evaluation of mass to the abdomen.  States started out 2 weeks ago, and has slowly increased in size.  Now for the past few days, with pain as well.  Denies redness, warmth.  Denies fever, chills, night sweats.  States gives Humira injections to the abdomen, but has not used the location in a while.  Denies abdominal pain, nausea, vomiting.  Denies trouble moving bowels.  Last BM yesterday, still passing flatus.     Past Medical History:  Diagnosis Date  . Allergy    SEASONAL  . Anemia 2008   Iron deficiency.  s/p transfusions 2009, 2010. Inconsistent compliance with po Iron.   . Blood transfusion without reported diagnosis   . Chronic ulcerative colitis ([place]) 2007  . Headache(784.0)   . Non compliance w medication regimen 06/2015   stopped Remicade, may not have been taking Azulfidine.    Patient Active Problem List   Diagnosis Date Noted  . S/P repeat low transverse C-section 08/15/2017  . Term pregnancy 08/14/2017  . Rectal bleeding 04/23/2014  . Status post C-section 12/10/2013  . IUGR (intrauterine growth restriction) 12/09/2013  . DENTAL CARIES 12/22/2009  . HYPERTHYROIDISM 08/03/2008  . CONSTIPATION 07/29/2008  . ACNE, MILD 07/29/2008  . ANEMIA 06/30/2008  . Ulcerative colitis ([place]) 11/20/2005    Past Surgical History:  Procedure Laterality Date  . CESAREAN SECTION N/A 12/09/2013   Procedure: Primary Cesarean Section Delivery Baby Girl @ 1959, Apgars 8/8;  Surgeon: Logan Bores, MD;  Location: [facility];  Service: Obstetrics;  Laterality: N/A;  . CESAREAN SECTION N/A 08/14/2017   Procedure: CESAREAN SECTION;  Surgeon: Paula Compton, MD;  Location: [facility];   Service: Obstetrics;  Laterality: N/A;  . COLONOSCOPY  01/19/2011   Dr Penelope Coop. no active UC  . COLONOSCOPY W/ BIOPSIES  04/2014   Dr Deatra Ina. Left sided colitis from prox descending to rectum. iNo inflammation at and proximal to splenic flexure.   [name] DILATION AND EVACUATION  08/21/2012   Procedure: DILATATION AND EVACUATION;  Surgeon: Logan Bores, MD;  Location: [facility];  Service: Gynecology;  Laterality: N/A;  . TONSILLECTOMY      OB History    Gravida  5   Para  3   Term  3   Preterm      AB  2   Living  3     SAB  1   TAB  1   Ectopic      Multiple  0   Live Births  3            Home Medications    Prior to Admission medications   Medication Sig Start Date End Date Taking? Authorizing Provider  acetaminophen (TYLENOL) 325 MG tablet Take 650 mg by mouth every 6 (six) hours as needed for moderate pain.     [provider]  Adalimumab (HUMIRA PEN) 40 MG/0.4ML PNKT Inject 40 mg into the skin every 14 (fourteen) days. 04/05/18   Mauri Pole, MD  butalbital-acetaminophen-caffeine (FIORICET, ESGIC) 50-325-40 MG tablet Take 1-2 tablets by mouth every 6 (six) hours as needed for headache. Patient not taking: Reported on 08/03/2018 04/07/18  04/07/19  Raylene Everts, MD  cephALEXin (KEFLEX) 500 MG capsule Take 1 capsule (500 mg total) by mouth 4 (four) times daily. 08/03/18   Ok Edwards, PA-C  ibuprofen (ADVIL,MOTRIN) 600 MG tablet Take 1 tablet (600 mg total) by mouth every 6 (six) hours as needed for moderate pain or cramping. 08/16/17   Banga, Cecilia Worema, DO  omeprazole (PRILOSEC) 40 MG capsule Take 1 capsule (40 mg total) by mouth daily. Patient not taking: Reported on 04/10/2018 04/07/18   Raylene Everts, MD  polyethylene glycol powder [facility]) powder Take 1 capful daily as needed for constipation Patient not taking: Reported on 08/03/2018 07/29/18   Mauri Pole, MD  Prenatal Vit-Fe Fumarate-FA (PRENATAL VITAMIN) 27-0.8 MG TABS  TAKE 1 TABLET BY MOUTH DAILY    [provider]    Family History Family History  Problem Relation Age of Onset  . Miscarriages / Stillbirths Unknown   . Hypotension Neg Hx   . Alcohol abuse Neg Hx   . Arthritis Neg Hx   . Asthma Neg Hx   . Birth defects Neg Hx   . COPD Neg Hx   . Depression Neg Hx   . Diabetes Neg Hx   . Drug abuse Neg Hx   . Hearing loss Neg Hx   . Heart disease Neg Hx   . Hyperlipidemia Neg Hx   . Hypertension Neg Hx   . Kidney disease Neg Hx   . Learning disabilities Neg Hx   . Mental illness Neg Hx   . Mental retardation Neg Hx   . Colon cancer Neg Hx   . Colon polyps Neg Hx   . Esophageal cancer Neg Hx   . Stomach cancer Neg Hx   . Rectal cancer Neg Hx     Social History Social History   Tobacco Use  . Smoking status: Light Tobacco Smoker    Years: 3.00  . Smokeless tobacco: Never Used  . Tobacco comment: smokes a cig "every blue moon"  Substance Use Topics  . Alcohol use: Yes    Alcohol/week: 0.0 standard drinks    Comment: occassional  . Drug use: No     Allergies   Latex and Remicade [infliximab]   Review of Systems Review of Systems  Reason unable to perform ROS: See HPI as above.     Physical Exam Triage Vital Signs ED Triage Vitals [08/03/18 1149]  Enc Vitals Group     BP 106/74     Pulse Rate 73     Resp 16     Temp 97.9 F (36.6 C)     Temp Source Oral     SpO2 100 %     Weight      Height      Head Circumference      Peak Flow      Pain Score      Pain Loc      Pain Edu?      Excl. in [place]?    No data found.  Updated Vital Signs BP 106/74 (BP Location: Right Arm)   Pulse 73   Temp 97.9 F (36.6 C) (Oral)   Resp 16   LMP 08/03/2018   SpO2 100%   Physical Exam  Constitutional: She is oriented to person, place, and time. She appears well-developed and well-nourished. No distress.  HENT:  Head: Normocephalic and atraumatic.  Eyes: Pupils are equal, round, and reactive to light. Conjunctivae  are normal.  Cardiovascular: Normal rate,  regular rhythm and normal heart sounds. Exam reveals no gallop and no friction rub.  No murmur heard. Pulmonary/Chest: Effort normal and breath sounds normal. She has no wheezes. She has no rales.  Abdominal: Soft. Bowel sounds are normal. She exhibits no mass. There is no tenderness. There is no rigidity, no rebound, no guarding and no CVA tenderness.  Mass to the right lower quadrant abdominal wall. Superficial swelling about 1cm x 0.5cm. Round mass felt around swelling about 3cm x 3cm. No surrounding erythema, warmth. Superficial swelling with fluctuance felt. No bulging of the area with Valsalva maneuver  Neurological: She is alert and oriented to person, place, and time.  Skin: Skin is warm and dry. She is not diaphoretic.  Psychiatric: She has a normal mood and affect. Her behavior is normal. Judgment normal.   UC Treatments / Results  Labs (all labs ordered are listed, but only abnormal results are displayed) Labs Reviewed - No data to display  EKG None  Radiology No results found.  Procedures Procedures (including critical care time)  Medications Ordered in UC Medications - No data to display  Initial Impression / Assessment and Plan / UC Course  I have reviewed the triage vital signs and the nursing notes.  Pertinent labs & imaging results that were available during my care of the patient were reviewed by me and considered in my medical decision making (see chart for details).    Discussed possible abscess to the abdominal wall causing symptoms.  Discussed possible hernia, the low suspicion as no increased bulging with Valsalva maneuver.  Patient would like to defer I&D for now, start antibiotics and warm compress.  Will follow-up with PCP for further evaluation if symptoms not improving.  Return precautions given.  Patient expresses understanding and agrees to plan.  Final Clinical Impressions(s) / UC Diagnoses   Final  diagnoses:  Abscess    ED Prescriptions    Medication Sig Dispense Auth. Provider   cephALEXin (KEFLEX) 500 MG capsule Take 1 capsule (500 mg total) by mouth 4 (four) times daily. 28 capsule Tobin Chad, [place] 08/03/18 1654

## 2018-08-03 NOTE — Discharge Instructions (Signed)
Start Keflex as directed.  Warm compress to the area.  Avoid the area when doing your Humira injections.  Please follow-up with PCP for further evaluation and management if symptoms does not resolve.

## 2018-08-03 NOTE — ED Triage Notes (Signed)
Pt states for the last three weeks she noticed a bump on the skin of her lower stomach. Denies drainage. States she gives herself insulin shots in her abdomen but usually does it on the opposite side of where the mass is.

## 2018-08-13 ENCOUNTER — Telehealth: Payer: Self-pay | Admitting: Gastroenterology

## 2018-08-13 NOTE — Telephone Encounter (Signed)
Patient is advised. Appointment tomorrow 9:30 am with Dr Silverio Decamp. She will drink an extra glass of Miralax today.

## 2018-08-13 NOTE — Telephone Encounter (Signed)
Calls with complaints of blood with her stools. Stools are "peebles." States Miralax "don"t work." Doesn't remember exactly when she last had her Humira injection stating "maybe a couple of days ago." No transportation to be seen in the office. She will have to arrange this. How would you like to proceed?

## 2018-08-13 NOTE — Telephone Encounter (Signed)
Please schedule for office visit soon. Please advise patient to start taking Miralax 1 capful BID. Anusol suppository at bedtime X 7 days.

## 2018-08-14 ENCOUNTER — Other Ambulatory Visit (INDEPENDENT_AMBULATORY_CARE_PROVIDER_SITE_OTHER): Payer: Medicaid Other

## 2018-08-14 ENCOUNTER — Encounter: Payer: Self-pay | Admitting: Gastroenterology

## 2018-08-14 ENCOUNTER — Ambulatory Visit (INDEPENDENT_AMBULATORY_CARE_PROVIDER_SITE_OTHER): Payer: Medicaid Other | Admitting: Gastroenterology

## 2018-08-14 VITALS — BP 104/76 | HR 72 | Ht 59.0 in | Wt 139.0 lb

## 2018-08-14 DIAGNOSIS — K602 Anal fissure, unspecified: Secondary | ICD-10-CM

## 2018-08-14 DIAGNOSIS — L02211 Cutaneous abscess of abdominal wall: Secondary | ICD-10-CM

## 2018-08-14 DIAGNOSIS — K5909 Other constipation: Secondary | ICD-10-CM | POA: Diagnosis not present

## 2018-08-14 DIAGNOSIS — K625 Hemorrhage of anus and rectum: Secondary | ICD-10-CM

## 2018-08-14 LAB — CBC WITH DIFFERENTIAL/PLATELET
BASOS ABS: 0 10*3/uL (ref 0.0–0.1)
BASOS PCT: 0.4 % (ref 0.0–3.0)
EOS ABS: 0.3 10*3/uL (ref 0.0–0.7)
Eosinophils Relative: 4.1 % (ref 0.0–5.0)
HCT: 40.5 % (ref 36.0–46.0)
Hemoglobin: 13.7 g/dL (ref 12.0–15.0)
Lymphocytes Relative: 50.7 % — ABNORMAL HIGH (ref 12.0–46.0)
Lymphs Abs: 3.6 10*3/uL (ref 0.7–4.0)
MCHC: 33.9 g/dL (ref 30.0–36.0)
MCV: 93.2 fl (ref 78.0–100.0)
MONO ABS: 1.1 10*3/uL — AB (ref 0.1–1.0)
Monocytes Relative: 16.4 % — ABNORMAL HIGH (ref 3.0–12.0)
NEUTROS ABS: 2 10*3/uL (ref 1.4–7.7)
NEUTROS PCT: 28.4 % — AB (ref 43.0–77.0)
PLATELETS: 354 10*3/uL (ref 150.0–400.0)
RBC: 4.34 Mil/uL (ref 3.87–5.11)
RDW: 14 % (ref 11.5–15.5)
WBC: 7 10*3/uL (ref 4.0–10.5)

## 2018-08-14 LAB — HIGH SENSITIVITY CRP: CRP, High Sensitivity: 0.28 mg/L (ref 0.000–5.000)

## 2018-08-14 MED ORDER — LUBIPROSTONE 24 MCG PO CAPS: 24.0000 ug | ORAL_CAPSULE | Freq: Two times a day (BID) | ORAL | 3 refills | Status: DC

## 2018-08-14 MED ORDER — SULFAMETHOXAZOLE-TRIMETHOPRIM 800-160 MG PO TABS: 1.0000 | ORAL_TABLET | Freq: Two times a day (BID) | ORAL | 0 refills | Status: DC

## 2018-08-14 MED ORDER — DILTIAZEM GEL 2 %: CUTANEOUS | 1 refills | Status: DC

## 2018-08-14 NOTE — Progress Notes (Signed)
Joyce Solomon    [id number]    1986-10-29  Primary Care Physician:System, Provider Not In  Referring Physician: No referring provider defined for this encounter.  Chief complaint: Rectal bleeding, constipation  HPI:  32 year old female with history of ulcerative colitis diagnosed in June 2015.  Clinical and endoscopic remission on Humira here for follow-up visit with complaints of worsening constipation and small-volume rectal bleeding. She has been passing hard stool, pellets once every other day despite taking MiraLAX 1 capful daily.  She also has severe discomfort during defecation and noticed small-volume rectal bleeding after bowel movement.  Denies any blood mixed in stool or diarrhea.  No abdominal pain, nausea, vomiting or weight loss.   Relevant GI history: Colonoscopy June 2015 showed moderately severe left-sided colitis Allergic reaction to Remicade in 2016.  Elevated antibody level to infliximab 1622 and undetectable drug level Flexible sigmoidoscopy January 2017 showed severe colitis left colon with ulceration, biopsies showed moderately active colitis. Started on Humira March 2017, and clinical remission since then Colonoscopy Apr 10, 2018 negative for active colitis, chronic mucosal changes with altered vascularity and mild erythema.  Random biopsies negative for active inflammation.   Outpatient Encounter Medications as of 08/14/2018  Medication Sig  . Adalimumab (HUMIRA PEN) 40 MG/0.4ML PNKT Inject 40 mg into the skin every 14 (fourteen) days.  . polyethylene glycol powder (GLYCOLAX/MIRALAX) powder Take 1 capful daily as needed for constipation  . diltiazem 2 % GEL Use Pea sized amount per rectum three times a day for 8 weeks  . lubiprostone (AMITIZA) 24 MCG capsule Take 1 capsule (24 mcg total) by mouth 2 (two) times daily with a meal.  . sulfamethoxazole-trimethoprim (BACTRIM DS) 800-160 MG tablet Take 1 tablet by mouth 2 (two) times daily. For 7  days  . [DISCONTINUED] acetaminophen (TYLENOL) 325 MG tablet Take 650 mg by mouth every 6 (six) hours as needed for moderate pain.   . [DISCONTINUED] butalbital-acetaminophen-caffeine (FIORICET, ESGIC) 50-325-40 MG tablet Take 1-2 tablets by mouth every 6 (six) hours as needed for headache. (Patient not taking: Reported on 08/03/2018)  . [DISCONTINUED] cephALEXin (KEFLEX) 500 MG capsule Take 1 capsule (500 mg total) by mouth 4 (four) times daily.  . [DISCONTINUED] ibuprofen (ADVIL,MOTRIN) 600 MG tablet Take 1 tablet (600 mg total) by mouth every 6 (six) hours as needed for moderate pain or cramping.  . [DISCONTINUED] omeprazole (PRILOSEC) 40 MG capsule Take 1 capsule (40 mg total) by mouth daily. (Patient not taking: Reported on 04/10/2018)  . [DISCONTINUED] Prenatal Vit-Fe Fumarate-FA (PRENATAL VITAMIN) 27-0.8 MG TABS TAKE 1 TABLET BY MOUTH DAILY  . [DISCONTINUED] 0.9 %  sodium chloride infusion    No facility-administered encounter medications on file as of 08/14/2018.     Allergies as of 08/14/2018 - Review Complete 08/14/2018  Allergen Reaction Noted  . Latex Rash 05/20/2009  . Remicade [infliximab] Itching 08/07/2015    Past Medical History:  Diagnosis Date  . Allergy    SEASONAL  . Anemia 2008   Iron deficiency.  s/p transfusions 2009, 2010. Inconsistent compliance with po Iron.   . Blood transfusion without reported diagnosis   . Chronic ulcerative colitis ([place]) 2007  . Headache(784.0)   . Non compliance w medication regimen 06/2015   stopped Remicade, may not have been taking Azulfidine.    Past Surgical History:  Procedure Laterality Date  . CESAREAN SECTION N/A 12/09/2013   Procedure: Primary Cesarean Section Delivery Baby Girl @ 1959, Apgars  8/8;  Surgeon: Logan Bores, MD;  Location: [facility];  Service: Obstetrics;  Laterality: N/A;  . CESAREAN SECTION N/A 08/14/2017   Procedure: CESAREAN SECTION;  Surgeon: Paula Compton, MD;  Location: [facility];  Service:  Obstetrics;  Laterality: N/A;  . COLONOSCOPY  01/19/2011   Dr Penelope Coop. no active UC  . COLONOSCOPY W/ BIOPSIES  04/2014   Dr Deatra Ina. Left sided colitis from prox descending to rectum. iNo inflammation at and proximal to splenic flexure.   [name] DILATION AND EVACUATION  08/21/2012   Procedure: DILATATION AND EVACUATION;  Surgeon: Logan Bores, MD;  Location: [facility];  Service: Gynecology;  Laterality: N/A;  . TONSILLECTOMY      Family History  Problem Relation Age of Onset  . Miscarriages / Stillbirths Unknown   . Hypotension Neg Hx   . Alcohol abuse Neg Hx   . Arthritis Neg Hx   . Asthma Neg Hx   . Birth defects Neg Hx   . COPD Neg Hx   . Depression Neg Hx   . Diabetes Neg Hx   . Drug abuse Neg Hx   . Hearing loss Neg Hx   . Heart disease Neg Hx   . Hyperlipidemia Neg Hx   . Hypertension Neg Hx   . Kidney disease Neg Hx   . Learning disabilities Neg Hx   . Mental illness Neg Hx   . Mental retardation Neg Hx   . Colon cancer Neg Hx   . Colon polyps Neg Hx   . Esophageal cancer Neg Hx   . Stomach cancer Neg Hx   . Rectal cancer Neg Hx     Social History   Socioeconomic History  . Marital status: Single    Spouse name: Not on file  . Number of children: 2  . Years of education: Not on file  . Highest education level: Not on file  Occupational History  . Occupation: unemployed  Social Needs  . Financial resource strain: Not on file  . Food insecurity:    Worry: Not on file    Inability: Not on file  . Transportation needs:    Medical: Not on file    Non-medical: Not on file  Tobacco Use  . Smoking status: Light Tobacco Smoker    Years: 3.00  . Smokeless tobacco: Never Used  . Tobacco comment: smokes a cig "every blue moon"  Substance and Sexual Activity  . Alcohol use: Yes    Alcohol/week: 0.0 standard drinks    Comment: occassional  . Drug use: No  . Sexual activity: Yes    Birth control/protection: None, Condom  Lifestyle  . Physical activity:    Days  per week: Not on file    Minutes per session: Not on file  . Stress: Not on file  Relationships  . Social connections:    Talks on phone: Not on file    Gets together: Not on file    Attends religious service: Not on file    Active member of club or organization: Not on file    Attends meetings of clubs or organizations: Not on file    Relationship status: Not on file  . Intimate partner violence:    Fear of current or ex partner: Not on file    Emotionally abused: Not on file    Physically abused: Not on file    Forced sexual activity: Not on file  Other Topics Concern  . Not on file  Social History  Narrative  . Not on file      Review of systems: Review of Systems  Constitutional: Negative for fever and chills.  HENT: Negative.   Eyes: Negative for blurred vision.  Respiratory: Negative for cough, shortness of breath and wheezing.   Cardiovascular: Negative for chest pain and palpitations.  Gastrointestinal: as per HPI Genitourinary: Negative for dysuria, urgency, frequency and hematuria.  Musculoskeletal: Negative for myalgias, back pain and joint pain.  Skin: Negative for itching and rash.  Neurological: Negative for dizziness, tremors, focal weakness, seizures and loss of consciousness.  Endo/Heme/Allergies: Positive for seasonal allergies.  Psychiatric/Behavioral: Negative for depression, suicidal ideas and hallucinations.  All other systems reviewed and are negative.   Physical Exam: Vitals:   08/14/18 0935  BP: 104/76  Pulse: 72   Body mass index is 28.07 kg/m. Gen:      No acute distress HEENT:  EOMI, sclera anicteric Neck:     No masses; no thyromegaly Lungs:    Clear to auscultation bilaterally; normal respiratory effort CV:         Regular rate and rhythm; no murmurs Abd:      + bowel sounds; soft, superficial abdominal wall abscess with mild tenderness Ext:    No edema; adequate peripheral perfusion Skin:      Warm and dry; no rash Neuro: alert  and oriented x 3 Psych: normal mood and affect Rectal exam: Increased anal sphincter tone with tenderness, anal fissure+, no external hemorrhoids  Data Reviewed:  Reviewed labs, radiology imaging, old records and pertinent past GI work up   Assessment and Plan/Recommendations: 32 year old female with history of left-sided ulcerative colitis with clinical and endoscopic remission on Humira here with anal discomfort and bright red blood per rectum secondary to anal fissure  Start diltiazem 2% gel per rectum small pea-sized amount, 2-3 times daily for 6 to 8 weeks Worsening constipation with hard stool: Start Amitiza 24 mcg twice daily Increase dietary fiber and fluid intake  Abdominal wall abscess, status post Keflex with no improvement.  Concerning for possible MRSA Will treat with double strength Bactrim twice daily for 7 days Advised patient to do soaking bath with warm water Call if no improvement with the next few days  Ulcerative colitis clinically in remission Due for surveillance colonoscopy in 2025  Greater than 50% of the time used for counseling as well as treatment plan and follow-up. She had multiple questions which were answered to her satisfaction  K. Denzil Magnuson , MD [phone]    CC: No ref. provider found

## 2018-08-14 NOTE — Patient Instructions (Addendum)
If you are age 33 or older, your body mass index should be between 23-30. Your Body mass index is 28.07 kg/m. If this is out of the aforementioned range listed, please consider follow up with your Primary Care Provider.  If you are age 19 or younger, your body mass index should be between 19-25. Your Body mass index is 28.07 kg/m. If this is out of the aformentioned range listed, please consider follow up with your Primary Care Provider.   Your provider has requested that you go to the basement level for lab work before leaving today. Press "B" on the elevator. The lab is located at the first door on the left as you exit the elevator.  We have sent the following medications to your pharmacy for you to pick up at your convenience:  Diltizem 2%  gel per rectum three times daily for 8 weeks  Amitiza 24 mcg twice daily  We have sent Bactrim to your pharmacy   Constipation, Adult Constipation is when a person:  Poops (has a bowel movement) fewer times in a week than normal.  Has a hard time pooping.  Has poop that is dry, hard, or bigger than normal.  Follow these instructions at home: Eating and drinking   Eat foods that have a lot of fiber, such as: ? Fresh fruits and vegetables. ? Whole grains. ? Beans.  Eat less of foods that are high in fat, low in fiber, or overly processed, such as: ? [place] fries. ? Hamburgers. ? Cookies. ? Candy. ? Soda.  Drink enough fluid to keep your pee (urine) clear or pale yellow. General instructions  Exercise regularly or as told by your doctor.  Go to the restroom when you feel like you need to poop. Do not hold it in.  Take over-the-counter and prescription medicines only as told by your doctor. These include any fiber supplements.  Do pelvic floor retraining exercises, such as: ? Doing deep breathing while relaxing your lower belly (abdomen). ? Relaxing your pelvic floor while pooping.  Watch your condition for any  changes.  Keep all follow-up visits as told by your doctor. This is important. Contact a doctor if:  You have pain that gets worse.  You have a fever.  You have not pooped for 4 days.  You throw up (vomit).  You are not hungry.  You lose weight.  You are bleeding from the anus.  You have thin, pencil-like poop (stool). Get help right away if:  You have a fever, and your symptoms suddenly get worse.  You leak poop or have blood in your poop.  Your belly feels hard or bigger than normal (is bloated).  You have very bad belly pain.  You feel dizzy or you faint. This information is not intended to replace advice given to you by your health care provider. Make sure you discuss any questions you have with your health care provider. Document Released: 04/24/2008 Document Revised: 05/26/2016 Document Reviewed: 04/26/2016 Elsevier Interactive Patient Education  2018 [employer].

## 2018-09-05 ENCOUNTER — Ambulatory Visit: Payer: Medicaid Other | Admitting: Gastroenterology

## 2018-09-11 ENCOUNTER — Encounter: Payer: Self-pay | Admitting: Gastroenterology

## 2018-09-11 ENCOUNTER — Ambulatory Visit (INDEPENDENT_AMBULATORY_CARE_PROVIDER_SITE_OTHER): Payer: Medicaid Other | Admitting: Gastroenterology

## 2018-09-11 VITALS — BP 100/60 | HR 99 | Ht 59.0 in | Wt 142.5 lb

## 2018-09-11 DIAGNOSIS — K5900 Constipation, unspecified: Secondary | ICD-10-CM | POA: Diagnosis not present

## 2018-09-11 DIAGNOSIS — K625 Hemorrhage of anus and rectum: Secondary | ICD-10-CM

## 2018-09-11 DIAGNOSIS — K602 Anal fissure, unspecified: Secondary | ICD-10-CM | POA: Diagnosis not present

## 2018-09-11 MED ORDER — DILTIAZEM GEL 2 %: CUTANEOUS | 1 refills | Status: DC

## 2018-09-11 MED ORDER — LINACLOTIDE 145 MCG PO CAPS: 145.0000 ug | ORAL_CAPSULE | Freq: Every day | ORAL | 0 refills | Status: DC

## 2018-09-11 NOTE — Progress Notes (Signed)
09/11/2018 Joyce Solomon [id number] 1986-05-09   HISTORY OF PRESENT ILLNESS:  This is a 32 year old female with history of ulcerative colitis diagnosed in June 2015.  Clinical and endoscopic remission on Humira here for follow-up visit with complaints of worsening constipation and small-volume rectal bleeding.  Was seen by Dr. Silverio Decamp 1 month ago and diagnosed with anal fissure as a cause of her complaints of rectal pain and bleeding at that time.  Was prescribed diltiazem gel to use 2-3 times daily.  Was also given samples of amitiza 24 mcg twice daily to take.  She tells me that she was not able to afford the diltiazem gel at that time so has not been using it.  Says she is no longer having pain in her bottom, but is still having small amounts of bleeding with each bowel movement.  She says that the [place] did not seem to help at all.  Also MiraLAX has not helped at all.  Still describes stools as being hard, pellet-like and feels like she does not completely empty.  She complains of abdominal cramping and discomfort as well.  She thinks she is having ulcerative colitis flare.  Mentions prednisone.  She seems somewhat disinterested or distracted.  Also seems semi-noncompliant.  I asked her when her last dose of Humira was and she says that she is overdue and will take it when she gets home.  Also was treated for and a small abdominal wall abscess.  She says she took all of that medication and is feeling much better.   Relevant GI history: Colonoscopy June 2015 showed moderately severe left-sided colitis Allergic reaction to Remicade in 2016.  Elevated antibody level to infliximab 1622 and undetectable drug level Flexible sigmoidoscopy January 2017 showed severe colitis left colon with ulceration, biopsies showed moderately active colitis. Started on Humira March 2017, and clinical remission since then Colonoscopy Apr 10, 2018 negative for active colitis, chronic mucosal changes with  altered vascularity and mild erythema.  Random biopsies negative for active inflammation.    Past Medical History:  Diagnosis Date  . Allergy    SEASONAL  . Anemia 2008   Iron deficiency.  s/p transfusions 2009, 2010. Inconsistent compliance with po Iron.   . Blood transfusion without reported diagnosis   . Chronic ulcerative colitis ([place]) 2007  . Headache(784.0)   . Non compliance w medication regimen 06/2015   stopped Remicade, may not have been taking Azulfidine.   Past Surgical History:  Procedure Laterality Date  . CESAREAN SECTION N/A 12/09/2013   Procedure: Primary Cesarean Section Delivery Baby Girl @ 1959, Apgars 8/8;  Surgeon: Logan Bores, MD;  Location: [facility];  Service: Obstetrics;  Laterality: N/A;  . CESAREAN SECTION N/A 08/14/2017   Procedure: CESAREAN SECTION;  Surgeon: Paula Compton, MD;  Location: [facility];  Service: Obstetrics;  Laterality: N/A;  . COLONOSCOPY  01/19/2011   Dr Penelope Coop. no active UC  . COLONOSCOPY W/ BIOPSIES  04/2014   Dr Deatra Ina. Left sided colitis from prox descending to rectum. iNo inflammation at and proximal to splenic flexure.   [name] DILATION AND EVACUATION  08/21/2012   Procedure: DILATATION AND EVACUATION;  Surgeon: Logan Bores, MD;  Location: [facility];  Service: Gynecology;  Laterality: N/A;  . TONSILLECTOMY      reports that she has been smoking. She has smoked for the past 3.00 years. She has never used smokeless tobacco. She reports that she drinks alcohol. She reports that she does  not use drugs. family history includes Miscarriages / Stillbirths in her unknown relative. Allergies  Allergen Reactions  . Latex Rash  . Remicade [Infliximab] Itching      Outpatient Encounter Medications as of 09/11/2018  Medication Sig  . Adalimumab (HUMIRA PEN) 40 MG/0.4ML PNKT Inject 40 mg into the skin every 14 (fourteen) days.  . [DISCONTINUED] diltiazem 2 % GEL Use Pea sized amount per rectum three times a day for 8 weeks  .  [DISCONTINUED] lubiprostone (AMITIZA) 24 MCG capsule Take 1 capsule (24 mcg total) by mouth 2 (two) times daily with a meal.  . [DISCONTINUED] polyethylene glycol powder (GLYCOLAX/MIRALAX) powder Take 1 capful daily as needed for constipation  . [DISCONTINUED] sulfamethoxazole-trimethoprim (BACTRIM DS) 800-160 MG tablet Take 1 tablet by mouth 2 (two) times daily. For 7 days   No facility-administered encounter medications on file as of 09/11/2018.      REVIEW OF SYSTEMS  : All other systems reviewed and negative except where noted in the History of Present Illness.   PHYSICAL EXAM: BP 100/60   Pulse 99   Ht 4' 11"  (1.499 m)   Wt 142 lb 8 oz (64.6 kg)   BMI 28.78 kg/m  General: Well developed black female in no acute distress Head: Normocephalic and atraumatic Eyes:  Sclerae anicteric, conjunctiva pink. Ears: Normal auditory acuity Lungs: Clear throughout to auscultation; no increased WOB. Heart: Regular rate and rhythm; no M/R/G. Abdomen: Soft, non-distended.  BS present.  Non-tender.  Area on right sided abdominal wall is not red, inflamed, tender, or draining. Rectal:  No re-examined today. Musculoskeletal: Symmetrical with no gross deformities  Skin: No lesions on visible extremities Extremities: No edema  Neurological: Alert oriented x 4, grossly non-focal Psychological:  Alert and cooperative. Normal mood and affect  ASSESSMENT AND PLAN: 32 year old female with history of left-sided ulcerative colitis with clinical and endoscopic remission on Humira.  *Anal discomfort and bright red blood per rectum:  Had anal fissure on exam by Dr. Silverio Decamp last month.  Says that she is no longer having pain but still having bleeding.  Never got the diltiazem gel because she could not afford it at the time.  I encouraged her to try to get that and use a small pea-sized amount, 2-3 times daily for 6 to 8 weeks *Worsening constipation with hard stool:  Says that amitiza and Miralax did not  help.  Will try Linzess 145 mcg daily--samples given. *Ulcerative colitis, seems to be clinically in remission:  Due for surveillance colonoscopy in 2025.  CC:  No ref. provider found

## 2018-09-11 NOTE — Patient Instructions (Addendum)
We are giving you a rx to take to [facility] to get filled for the diltiazem 2% gel.  We are giving you Linzess samples to take one daily on an empty stomach for constipation. Try this for 10 days and then call us back between day 7-10  and ask for Patty, RN to update her on how your doing.   I appreciate the opportunity to care for you. Alonza Bogus, PA-C

## 2018-09-25 ENCOUNTER — Other Ambulatory Visit: Payer: Self-pay | Admitting: Gastroenterology

## 2018-09-30 NOTE — Progress Notes (Signed)
Reviewed and agree with documentation and assessment and plan. K. Veena Tenisha Fleece , MD   

## 2018-10-03 ENCOUNTER — Encounter (HOSPITAL_COMMUNITY): Payer: Self-pay

## 2018-10-25 ENCOUNTER — Telehealth: Payer: Self-pay

## 2018-10-25 NOTE — Telephone Encounter (Signed)
Left message. Scheduled her for the 6 month follow up on 12/17/18 At 10:00 am Card mailed also.

## 2018-11-18 ENCOUNTER — Emergency Department (HOSPITAL_COMMUNITY)
Admission: EM | Admit: 2018-11-18 | Discharge: 2018-11-18 | Discharge status: Against Medical Advice | Payer: Medicaid Other | Attending: Emergency Medicine | Admitting: Emergency Medicine

## 2018-11-18 ENCOUNTER — Other Ambulatory Visit: Payer: Self-pay

## 2018-11-18 ENCOUNTER — Encounter (HOSPITAL_COMMUNITY): Payer: Self-pay | Admitting: Emergency Medicine

## 2018-11-18 DIAGNOSIS — R220 Localized swelling, mass and lump, head: Secondary | ICD-10-CM | POA: Diagnosis not present

## 2018-11-18 DIAGNOSIS — Z5321 Procedure and treatment not carried out due to patient leaving prior to being seen by health care provider: Secondary | ICD-10-CM | POA: Diagnosis not present

## 2018-11-18 NOTE — ED Triage Notes (Signed)
Pt complaint of lump to right posterior neck/head for 2 months; also complaint of scalp itching.

## 2018-11-23 ENCOUNTER — Other Ambulatory Visit: Payer: Self-pay

## 2018-11-23 ENCOUNTER — Ambulatory Visit (HOSPITAL_COMMUNITY)
Admission: EM | Admit: 2018-11-23 | Discharge: 2018-11-23 | Disposition: A | Discharge status: Home / Self Care | Payer: Medicaid Other | Attending: Family Medicine | Admitting: Family Medicine

## 2018-11-23 ENCOUNTER — Encounter (HOSPITAL_COMMUNITY): Payer: Self-pay

## 2018-11-23 DIAGNOSIS — R591 Generalized enlarged lymph nodes: Secondary | ICD-10-CM | POA: Diagnosis not present

## 2018-11-23 DIAGNOSIS — L659 Nonscarring hair loss, unspecified: Secondary | ICD-10-CM

## 2018-11-23 NOTE — ED Triage Notes (Signed)
Pt presents to [facility] for bump behind rt ear x2 months , pt also complains of hair loss

## 2018-11-23 NOTE — ED Notes (Signed)
Pt discharged by provider.

## 2018-11-25 NOTE — ED Provider Notes (Signed)
[facility]   [id number] 11/23/18 Arrival Time: 6837  ASSESSMENT & PLAN:  1. Lymphadenopathy of head and neck   2. Hair loss    Sees dermatologist on 12/03/2018. Keep appointment. Reassured that her swollen lymph nodes are likely related to her scalp irration and hair loss. No signs of active infection. Will not initiate any treatment since she is about to see a dermatologist. Discussed.  May f/u here as needed. Reviewed expectations re: course of current medical issues. Questions answered. Outlined signs and symptoms indicating need for more acute intervention. Patient verbalized understanding. After Visit Summary given.   SUBJECTIVE:  ALISHIA LEBO is a 33 y.o. female who presents with worries over "feeling some bumps on my head behind my ears". Has noticed over the past two months. No worsening. Some tenderness to areas. No drainage or bleeding. Afebrile. Reports hair loss for many months and scalp dryness/irritation. Has appt with dermatology. No new hair products or exposures. No recent illnesses.  Associated pruritis? Yes; with scalp irritation Progression: overall stable Drainage? No  Known trigger? No  New soaps/lotions/topicals/detergents/environmental exposures? No Contacts with similar? No Recent travel? No  Other associated symptoms: as above Therapies tried thus far: none Arthralgia or myalgia? none No specific aggravating or alleviating factors reported.  ROS: As per HPI.  OBJECTIVE: Vitals:   11/23/18 1809 11/23/18 1811  BP: 119/64   Pulse: 80   Resp: 17   Temp: 98.1 F (36.7 C)   TempSrc: Oral   SpO2: 98% 98%    General appearance: alert; no distress Neck: does have bilateral pre-auricular and occipital LAD; no significant tenderness; all nodes <1cm Lungs: clear to auscultation bilaterally Heart: regular rate and rhythm Extremities: no edema Skin: warm and dry; skin of scalp very dry and irritated; patchy hair loss; scalp is  non-tender to touch; no areas of drainage or bleeding Psychological: alert and cooperative; normal mood and affect  Allergies  Allergen Reactions  . Latex Rash  . Remicade [Infliximab] Itching    Past Medical History:  Diagnosis Date  . Allergy    SEASONAL  . Iron Deficiency Anemia 2008   s/p transfusions 2009, 2010; inconsistent compliance with PO iron.   . Non compliance w medication regimen 06/2015   stopped Remicade, may not have been taking Azulfidine.  [name] Ulcerative Colitis 2007   on Humira   Social History   Socioeconomic History  . Marital status: Single    Spouse name: Not on file  . Number of children: 2  . Years of education: Not on file  . Highest education level: Not on file  Occupational History  . Occupation: unemployed  Social Needs  . Financial resource strain: Not on file  . Food insecurity:    Worry: Not on file    Inability: Not on file  . Transportation needs:    Medical: Not on file    Non-medical: Not on file  Tobacco Use  . Smoking status: Light Tobacco Smoker    Years: 3.00  . Smokeless tobacco: Never Used  . Tobacco comment: smokes a cig "every blue moon"  Substance and Sexual Activity  . Alcohol use: Yes    Alcohol/week: 0.0 standard drinks    Comment: occassional  . Drug use: No  . Sexual activity: Yes    Birth control/protection: None, Condom  Lifestyle  . Physical activity:    Days per week: Not on file    Minutes per session: Not on file  . Stress: Not on  file  Relationships  . Social connections:    Talks on phone: Not on file    Gets together: Not on file    Attends religious service: Not on file    Active member of club or organization: Not on file    Attends meetings of clubs or organizations: Not on file    Relationship status: Not on file  . Intimate partner violence:    Fear of current or ex partner: Not on file    Emotionally abused: Not on file    Physically abused: Not on file    Forced sexual activity: Not on  file  Other Topics Concern  . Not on file  Social History Narrative  . Not on file   Family History  Problem Relation Age of Onset  . Miscarriages / Stillbirths Other   . Hypotension Neg Hx   . Alcohol abuse Neg Hx   . Arthritis Neg Hx   . Asthma Neg Hx   . Birth defects Neg Hx   . COPD Neg Hx   . Depression Neg Hx   . Diabetes Neg Hx   . Drug abuse Neg Hx   . Hearing loss Neg Hx   . Heart disease Neg Hx   . Hyperlipidemia Neg Hx   . Hypertension Neg Hx   . Kidney disease Neg Hx   . Learning disabilities Neg Hx   . Mental illness Neg Hx   . Mental retardation Neg Hx   . Colon cancer Neg Hx   . Colon polyps Neg Hx   . Esophageal cancer Neg Hx   . Stomach cancer Neg Hx   . Rectal cancer Neg Hx    Past Surgical History:  Procedure Laterality Date  . CESAREAN SECTION N/A 12/09/2013   Procedure: Primary Cesarean Section Delivery Baby Girl @ 1960, Apgars 8/8;  Surgeon: Logan Bores, MD;  Location: [facility];  Service: Obstetrics;  Laterality: N/A;  . CESAREAN SECTION N/A 08/14/2017   Procedure: CESAREAN SECTION;  Surgeon: Paula Compton, MD;  Location: [facility];  Service: Obstetrics;  Laterality: N/A;  . COLONOSCOPY  01/19/2011   Dr Penelope Coop. no active UC  . COLONOSCOPY W/ BIOPSIES N/A 04/2014   Dr Deatra Ina: Left sided colitis from prox descending to rectum; no inflammation at or proximal to splenic flexure  . DILATION AND EVACUATION  08/21/2012   Procedure: DILATATION AND EVACUATION;  Surgeon: Logan Bores, MD;  Location: [facility];  Service: Gynecology  . Evonnie Dawes, MD 11/25/18 1359

## 2018-12-11 ENCOUNTER — Ambulatory Visit: Payer: Medicaid Other | Admitting: Physician Assistant

## 2018-12-11 ENCOUNTER — Telehealth: Payer: Self-pay | Admitting: *Deleted

## 2018-12-11 DIAGNOSIS — K51311 Ulcerative (chronic) rectosigmoiditis with rectal bleeding: Secondary | ICD-10-CM

## 2018-12-11 NOTE — Telephone Encounter (Signed)
Ok thanks 

## 2018-12-11 NOTE — Telephone Encounter (Signed)
Dr Silverio Decamp we received a fax from [facility]:  That states, "Joyce Solomon is in a flare, she is not having to run to the bathroom as much but she is still having blood in her stools. She has an upcoming appointment but I wanted you to be aware since she may have built up antibodies to the humira"     Her appointment is on the 28th

## 2018-12-11 NOTE — Telephone Encounter (Signed)
Noted  

## 2018-12-11 NOTE — Telephone Encounter (Signed)
Please check if she can be seen this week in the office. Check CBC, CMP, CRP. Canasa suppository daily X 2 weeks. Will check Humira drug level and drug trough, when is she due for next injection?

## 2018-12-11 NOTE — Telephone Encounter (Addendum)
Put patient on with Amy for 12/12/2018 at 2pm per Dr Silverio Decamp patient will have CBC  CMP CRP drawn before her appointment  Patient will need Humira drug level and drug trough     Patient aware   Dr Silverio Decamp I added this patient on with Amy for tomorrow

## 2018-12-12 ENCOUNTER — Ambulatory Visit: Payer: Medicaid Other | Admitting: Physician Assistant

## 2018-12-12 NOTE — Telephone Encounter (Signed)
Pt no showed for appt...Marland KitchenMarland Kitchen

## 2018-12-12 NOTE — Telephone Encounter (Signed)
ok 

## 2018-12-17 ENCOUNTER — Other Ambulatory Visit (INDEPENDENT_AMBULATORY_CARE_PROVIDER_SITE_OTHER): Payer: Medicaid Other

## 2018-12-17 ENCOUNTER — Ambulatory Visit (INDEPENDENT_AMBULATORY_CARE_PROVIDER_SITE_OTHER): Payer: Medicaid Other | Admitting: Gastroenterology

## 2018-12-17 ENCOUNTER — Other Ambulatory Visit: Payer: Medicaid Other

## 2018-12-17 ENCOUNTER — Encounter: Payer: Self-pay | Admitting: Gastroenterology

## 2018-12-17 VITALS — BP 100/70 | HR 64 | Ht 59.5 in | Wt 143.4 lb

## 2018-12-17 DIAGNOSIS — K625 Hemorrhage of anus and rectum: Secondary | ICD-10-CM | POA: Diagnosis not present

## 2018-12-17 DIAGNOSIS — R197 Diarrhea, unspecified: Secondary | ICD-10-CM | POA: Diagnosis not present

## 2018-12-17 DIAGNOSIS — K51311 Ulcerative (chronic) rectosigmoiditis with rectal bleeding: Secondary | ICD-10-CM | POA: Diagnosis not present

## 2018-12-17 LAB — COMPREHENSIVE METABOLIC PANEL
ALT: 8 U/L (ref 0–35)
AST: 9 U/L (ref 0–37)
Albumin: 4.1 g/dL (ref 3.5–5.2)
Alkaline Phosphatase: 64 U/L (ref 39–117)
BUN: 9 mg/dL (ref 6–23)
CHLORIDE: 105 meq/L (ref 96–112)
CO2: 25 mEq/L (ref 19–32)
Calcium: 8.9 mg/dL (ref 8.4–10.5)
Creatinine, Ser: 0.55 mg/dL (ref 0.40–1.20)
GFR: 153.94 mL/min (ref >60.00)
Glucose, Bld: 96 mg/dL (ref 70–99)
Potassium: 4 mEq/L (ref 3.5–5.1)
Sodium: 137 mEq/L (ref 135–145)
Total Bilirubin: 0.2 mg/dL (ref 0.2–1.2)
Total Protein: 7.1 g/dL (ref 6.0–8.3)

## 2018-12-17 LAB — CBC WITH DIFFERENTIAL/PLATELET
BASOS PCT: 0.5 % (ref 0.0–3.0)
Basophils Absolute: 0 10*3/uL (ref 0.0–0.1)
EOS ABS: 0.3 10*3/uL (ref 0.0–0.7)
Eosinophils Relative: 3.5 % (ref 0.0–5.0)
HCT: 42.5 % (ref 36.0–46.0)
Hemoglobin: 14.1 g/dL (ref 12.0–15.0)
Lymphocytes Relative: 49.3 % — ABNORMAL HIGH (ref 12.0–46.0)
Lymphs Abs: 4.3 10*3/uL — ABNORMAL HIGH (ref 0.7–4.0)
MCHC: 33.2 g/dL (ref 30.0–36.0)
MCV: 94.7 fl (ref 78.0–100.0)
Monocytes Absolute: 1.3 10*3/uL — ABNORMAL HIGH (ref 0.1–1.0)
Monocytes Relative: 14.6 % — ABNORMAL HIGH (ref 3.0–12.0)
Neutro Abs: 2.8 10*3/uL (ref 1.4–7.7)
Neutrophils Relative %: 32.1 % — ABNORMAL LOW (ref 43.0–77.0)
Platelets: 299 10*3/uL (ref 150.0–400.0)
RBC: 4.49 Mil/uL (ref 3.87–5.11)
RDW: 14 % (ref 11.5–15.5)
WBC: 8.6 10*3/uL (ref 4.0–10.5)

## 2018-12-17 LAB — C-REACTIVE PROTEIN: CRP: <1 mg/dL (ref 0.5–20.0)

## 2018-12-17 MED ORDER — PREDNISONE 20 MG PO TABS: 40.0000 mg | ORAL_TABLET | Freq: Every day | ORAL | 0 refills | Status: DC

## 2018-12-17 NOTE — Progress Notes (Signed)
Joyce Solomon    [id number]    [birth year]  Primary Care Physician:System, Provider Not In  Referring Physician: No referring provider defined for this encounter.  Chief complaint:  Diarrhea, rectal bleeding.  HPI: 33 year old female with history of ulcerative colitis, on Humira biweekly maintenance dose. She started noticing change in bowel habits with increased bowel frequency and diarrhea in the past few months.  She is no longer taking Linzess or any over-the-counter laxatives.  She is also having worsening rectal bleeding with almost every bowel movement.  Stool is watery to semi-formed with blood. Denies any fevers but has intermittent chills.  Feels tired all the time. She was treated with antibiotics for abdominal wall abscess September 2019. She was seen by Alonza Bogus September 11, 2018, was having abdominal cramping and discomfort but was passing hard stool and pellets at the time.  Was recommended to continue Linzess.  Relevant GI history: Colonoscopy June 2015 showed moderately severe left-sided colitis Allergic reaction to Remicade in 2016.  Elevated antibody level to infliximab 1622 and undetectable drug level Flexible sigmoidoscopy January 2017 showed severe colitis left colon with ulceration, biopsies showed moderately active colitis. Started on Humira March 2017, and clinical remission since then Colonoscopy Apr 10, 2018 negative for active colitis, chronic mucosal changes with altered vascularity and mild erythema.  Random biopsies negative for active inflammation.   Outpatient Encounter Medications as of 12/17/2018  Medication Sig  . HUMIRA PEN 40 MG/0.4ML PNKT INJECT 40 MG INTO THE SKIN EVERY 14 (FOURTEEN) DAYS.  [name] linaclotide (LINZESS) 145 MCG CAPS capsule Take 1 capsule (145 mcg total) by mouth daily before breakfast.  . [DISCONTINUED] diltiazem 2 % GEL Use Pea sized amount per rectum two- three times a day for 6- 8 weeks   No  facility-administered encounter medications on file as of 12/17/2018.     Allergies as of 12/17/2018 - Review Complete 12/17/2018  Allergen Reaction Noted  . Latex Rash 05/20/2009  . Remicade [infliximab] Itching 08/07/2015    Past Medical History:  Diagnosis Date  . Allergy    SEASONAL  . Iron Deficiency Anemia 2008   s/p transfusions 2009, 2010; inconsistent compliance with PO iron.   . Non compliance w medication regimen 06/2015   stopped Remicade, may not have been taking Azulfidine.  [name] Ulcerative Colitis 2007   on Humira    Past Surgical History:  Procedure Laterality Date  . CESAREAN SECTION N/A 12/09/2013   Procedure: Primary Cesarean Section Delivery Baby Girl @ 1959, Apgars 8/8;  Surgeon: Logan Bores, MD;  Location: [facility];  Service: Obstetrics;  Laterality: N/A;  . CESAREAN SECTION N/A 08/14/2017   Procedure: CESAREAN SECTION;  Surgeon: Paula Compton, MD;  Location: [facility];  Service: Obstetrics;  Laterality: N/A;  . COLONOSCOPY  01/19/2011   Dr Penelope Coop. no active UC  . COLONOSCOPY W/ BIOPSIES N/A 04/2014   Dr Deatra Ina: Left sided colitis from prox descending to rectum; no inflammation at or proximal to splenic flexure  . DILATION AND EVACUATION  08/21/2012   Procedure: DILATATION AND EVACUATION;  Surgeon: Logan Bores, MD;  Location: [facility];  Service: Gynecology  . TONSILLECTOMY      Family History  Problem Relation Age of Onset  . Miscarriages / Stillbirths Other   . Hypotension Neg Hx   . Alcohol abuse Neg Hx   . Arthritis Neg Hx   . Asthma Neg Hx   . Birth defects  Neg Hx   . COPD Neg Hx   . Depression Neg Hx   . Diabetes Neg Hx   . Drug abuse Neg Hx   . Hearing loss Neg Hx   . Heart disease Neg Hx   . Hyperlipidemia Neg Hx   . Hypertension Neg Hx   . Kidney disease Neg Hx   . Learning disabilities Neg Hx   . Mental illness Neg Hx   . Mental retardation Neg Hx   . Colon cancer Neg Hx   . Colon polyps Neg Hx   . Esophageal  cancer Neg Hx   . Stomach cancer Neg Hx   . Rectal cancer Neg Hx     Social History   Socioeconomic History  . Marital status: Single    Spouse name: Not on file  . Number of children: 2  . Years of education: Not on file  . Highest education level: Not on file  Occupational History  . Occupation: unemployed  Social Needs  . Financial resource strain: Not on file  . Food insecurity:    Worry: Not on file    Inability: Not on file  . Transportation needs:    Medical: Not on file    Non-medical: Not on file  Tobacco Use  . Smoking status: Light Tobacco Smoker    Years: 3.00  . Smokeless tobacco: Never Used  . Tobacco comment: smokes a cig "every blue moon"  Substance and Sexual Activity  . Alcohol use: Yes    Alcohol/week: 0.0 standard drinks    Comment: occassional  . Drug use: No  . Sexual activity: Yes    Birth control/protection: None, Condom  Lifestyle  . Physical activity:    Days per week: Not on file    Minutes per session: Not on file  . Stress: Not on file  Relationships  . Social connections:    Talks on phone: Not on file    Gets together: Not on file    Attends religious service: Not on file    Active member of club or organization: Not on file    Attends meetings of clubs or organizations: Not on file    Relationship status: Not on file  . Intimate partner violence:    Fear of current or ex partner: Not on file    Emotionally abused: Not on file    Physically abused: Not on file    Forced sexual activity: Not on file  Other Topics Concern  . Not on file  Social History Narrative  . Not on file      Review of systems: Review of Systems  Constitutional: Negative for fever and positive for chills and fatigue.  HENT: Negative.   Eyes: Negative for blurred vision.  Respiratory: Negative for cough, shortness of breath and wheezing.   Cardiovascular: Negative for chest pain and palpitations.  Gastrointestinal: as per HPI Genitourinary:  Negative for dysuria, urgency, frequency and hematuria.  Musculoskeletal: Negative for myalgias, back pain and joint pain.  Skin: Negative for itching and rash.  Neurological: Negative for dizziness, tremors, focal weakness, seizures and loss of consciousness.  Endo/Heme/Allergies: Negative for seasonal allergies.  Psychiatric/Behavioral: Negative for depression, suicidal ideas and hallucinations.  All other systems reviewed and are negative.   Physical Exam: Vitals:   12/17/18 1017  BP: 100/70  Pulse: 64   Body mass index is 28.47 kg/m. Gen:      No acute distress HEENT:  EOMI, sclera anicteric Neck:  No masses; no thyromegaly Lungs:    Clear to auscultation bilaterally; normal respiratory effort CV:         Regular rate and rhythm; no murmurs Abd:      + bowel sounds; soft, non-tender; no palpable masses, no distension Ext:    No edema; adequate peripheral perfusion Skin:      Warm and dry; no rash Neuro: alert and oriented x 3 Psych: normal mood and affect  Data Reviewed:  Reviewed labs, radiology imaging, old records and pertinent past GI work up   Assessment and Plan/Recommendations:  33 year old female with history of ulcerative colitis (left-sided) with complaints of diarrhea and rectal bleeding concerning for flare with uncontrolled disease ?  Noncompliance to Humira versus non-response Follow-up Humira drug level and antibody level Follow-up CBC, CMP and CRP We will check GI pathogen panel to exclude infectious etiology Prednisone 40 mg daily with 5 mg taper every week. May need to consider colonoscopy if continues to have persistent symptoms, based on lab and stool studies Start oral iron, ferrous sulfate 325 mg 3 times daily and multivitamin daily  Return in 2 weeks or sooner if needed    K. Denzil Magnuson , MD [phone]    CC: No ref. provider found

## 2018-12-17 NOTE — Patient Instructions (Signed)
Go to the basement for labs today  STOP Linzess  Take a multivitamin daily  Take Iron ferrous sulfate 336m three times a day  We will contact you when to start Prednisone    If you are age 7772or older, your body mass index should be between 23-30. Your Body mass index is 28.47 kg/m. If this is out of the aforementioned range listed, please consider follow up with your Primary Care Provider.  If you are age 7784or younger, your body mass index should be between 19-25. Your Body mass index is 28.47 kg/m. If this is out of the aformentioned range listed, please consider follow up with your Primary Care Provider.    I appreciate the  opportunity to care for you  Thank You   KHarl Bowie, MD

## 2018-12-18 ENCOUNTER — Other Ambulatory Visit: Payer: Medicaid Other

## 2018-12-18 ENCOUNTER — Telehealth: Payer: Self-pay | Admitting: Gastroenterology

## 2018-12-18 DIAGNOSIS — R197 Diarrhea, unspecified: Secondary | ICD-10-CM

## 2018-12-18 DIAGNOSIS — K625 Hemorrhage of anus and rectum: Secondary | ICD-10-CM

## 2018-12-18 DIAGNOSIS — K51311 Ulcerative (chronic) rectosigmoiditis with rectal bleeding: Secondary | ICD-10-CM

## 2018-12-18 NOTE — Telephone Encounter (Signed)
Couldn't get pharmacy to answer the phone so I faxed over my response By 5 mgs

## 2018-12-18 NOTE — Telephone Encounter (Signed)
[facility] pharmacist asked for clarifications for prednisone prescription.  Instructions were to " taper by 5's..."  She was unsure 5 mg or 5 t.

## 2018-12-20 LAB — GASTROINTESTINAL PATHOGEN PANEL PCR
C. difficile Tox A/B, PCR: NOT DETECTED
Campylobacter, PCR: NOT DETECTED
Cryptosporidium, PCR: NOT DETECTED
E coli (ETEC) LT/ST PCR: NOT DETECTED
E coli (STEC) stx1/stx2, PCR: NOT DETECTED
E coli 0157, PCR: NOT DETECTED
Giardia lamblia, PCR: NOT DETECTED
Norovirus, PCR: NOT DETECTED
Rotavirus A, PCR: NOT DETECTED
Salmonella, PCR: NOT DETECTED
Shigella, PCR: NOT DETECTED

## 2018-12-24 LAB — ADALIMUMAB LEVEL AND ANTIBODY
Adalimumab Drug Level: 5.2 ug/mL
Anti-Adalimumab Antibody: <25 ng/mL

## 2019-01-02 ENCOUNTER — Telehealth: Payer: Self-pay | Admitting: Gastroenterology

## 2019-01-02 MED ORDER — PREDNISONE 20 MG PO TABS: 40.0000 mg | ORAL_TABLET | Freq: Every day | ORAL | 0 refills | Status: DC

## 2019-01-02 NOTE — Telephone Encounter (Signed)
Spoke with patient and she has not even started her prednisone taper yet  Will start today  Sent in script to [facility]

## 2019-01-02 NOTE — Telephone Encounter (Signed)
Sent in rx for pt to complete taper

## 2019-01-06 ENCOUNTER — Ambulatory Visit (HOSPITAL_COMMUNITY)
Admission: EM | Admit: 2019-01-06 | Discharge: 2019-01-06 | Disposition: A | Discharge status: Home / Self Care | Payer: Medicaid Other | Attending: Internal Medicine | Admitting: Internal Medicine

## 2019-01-06 ENCOUNTER — Telehealth: Payer: Self-pay | Admitting: Gastroenterology

## 2019-01-06 ENCOUNTER — Encounter (HOSPITAL_COMMUNITY): Payer: Self-pay

## 2019-01-06 DIAGNOSIS — H01134 Eczematous dermatitis of left upper eyelid: Secondary | ICD-10-CM

## 2019-01-06 DIAGNOSIS — H01131 Eczematous dermatitis of right upper eyelid: Secondary | ICD-10-CM

## 2019-01-06 MED ORDER — ALCLOMETASONE DIPROPIONATE 0.05 % EX OINT: TOPICAL_OINTMENT | Freq: Two times a day (BID) | CUTANEOUS | 0 refills | Status: DC

## 2019-01-06 MED ORDER — PREDNISONE 20 MG PO TABS: 40.0000 mg | ORAL_TABLET | Freq: Every day | ORAL | 0 refills | Status: DC

## 2019-01-06 NOTE — Telephone Encounter (Signed)
Medication resent to pharmacy

## 2019-01-06 NOTE — ED Provider Notes (Signed)
[place]    CSN: [id number] Arrival date & time: 01/06/19  1346     History   Chief Complaint Chief Complaint  Patient presents with  . Eye Problem    HPI Joyce Solomon is a 33 y.o. female.   She presents today with redness, puffiness, itching/irritation of the bilateral upper eyelids today.  Started in the right lid yesterday and in the left lid today.  She was wearing eyelash extensions for the last couple of days, applied them on 2/14, but removed them last night.  No change in vision.  She did have a little bit of crustiness around her eyelids this morning, and has had some watering today.  Denies eye pain, eyelid pain, photophobia.  Right eye is a little bit injected. No runny/congested nose, no cough, no sore throat.  No headache.  Says she has had a little bit of sneezing. Worried about going to work and being sent home, [place].  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    HPI  Past Medical History:  Diagnosis Date  . Allergy    SEASONAL  . Iron Deficiency Anemia 2008   s/p transfusions 2009, 2010; inconsistent compliance with PO iron.   . Non compliance w medication regimen 06/2015   stopped Remicade, may not have been taking Azulfidine.  [name] Ulcerative Colitis 2007   on Humira    Patient Active Problem List   Diagnosis Date Noted  . Anal fissure 09/11/2018  . S/P repeat low transverse C-section 08/15/2017  . Term pregnancy 08/14/2017  . Rectal bleeding 04/23/2014  . Status post C-section 12/10/2013  . IUGR (intrauterine growth restriction) 12/09/2013  . DENTAL CARIES 12/22/2009  . HYPERTHYROIDISM 08/03/2008  . Constipation 07/29/2008  . ACNE, MILD 07/29/2008  . Anemia 06/30/2008  . Ulcerative colitis ([place]) 11/20/2005    Past Surgical History:  Procedure Laterality Date  . CESAREAN SECTION N/A 12/09/2013   Procedure: Primary Cesarean Section Delivery Baby Girl @ 1959, Apgars 8/8;  Surgeon: Logan Bores, MD;  Location: [facility];  Service: Obstetrics;  Laterality: N/A;  . CESAREAN SECTION N/A 08/14/2017   Procedure: CESAREAN SECTION;  Surgeon: Paula Compton, MD;  Location: [facility];  Service: Obstetrics;  Laterality: N/A;  . COLONOSCOPY  01/19/2011   Dr Penelope Coop. no active UC  . COLONOSCOPY W/ BIOPSIES N/A 04/2014   Dr Deatra Ina: Left sided colitis from prox descending to rectum; no inflammation at or proximal to splenic flexure  . DILATION AND EVACUATION  08/21/2012   Procedure: DILATATION AND EVACUATION;  Surgeon: Logan Bores, MD;  Location: [facility];  Service: Gynecology  . TONSILLECTOMY      OB History    Gravida  5   Para  3   Term  3   Preterm      AB  2   Living  3     SAB  1   TAB  1   Ectopic      Multiple  0   Live Births  3            Home Medications    Prior to Admission medications   Medication Sig Start Date End Date Taking? Authorizing Provider  alclomethasone (ACLOVATE) 0.05 % ointment Apply topically 2 (two) times daily. 01/06/19   Wynona Luna, MD  HUMIRA PEN 40 MG/0.4ML PNKT INJECT 40 MG INTO THE SKIN EVERY 14 (FOURTEEN) DAYS. 09/26/18   Harl Bowie  V, MD  linaclotide (LINZESS) 145 MCG CAPS capsule Take 1 capsule (145 mcg total) by mouth daily before breakfast. 09/11/18   Zehr, Laban Emperor, PA-C  predniSONE (DELTASONE) 20 MG tablet Take 2 tablets (40 mg total) by mouth daily with breakfast. Patient will do 40 mg daily for 1 week then taper by 5's weekly until comleted taper 01/06/19   Mauri Pole, MD    Family History Family History  Problem Relation Age of Onset  . Miscarriages / Stillbirths Other   . Hypotension Neg Hx   . Alcohol abuse Neg Hx   . Arthritis Neg Hx   . Asthma Neg Hx   . Birth defects Neg Hx   . COPD Neg Hx   . Depression Neg Hx   . Diabetes Neg Hx   . Drug abuse Neg Hx   . Hearing loss Neg Hx   . Heart disease Neg Hx   . Hyperlipidemia Neg Hx   . Hypertension Neg Hx   .  Kidney disease Neg Hx   . Learning disabilities Neg Hx   . Mental illness Neg Hx   . Mental retardation Neg Hx   . Colon cancer Neg Hx   . Colon polyps Neg Hx   . Esophageal cancer Neg Hx   . Stomach cancer Neg Hx   . Rectal cancer Neg Hx     Social History Social History   Tobacco Use  . Smoking status: Light Tobacco Smoker    Years: 3.00  . Smokeless tobacco: Never Used  . Tobacco comment: smokes a cig "every blue moon"  Substance Use Topics  . Alcohol use: Yes    Alcohol/week: 0.0 standard drinks    Comment: occassional  . Drug use: No     Allergies   Latex and Remicade [infliximab]   Review of Systems Review of Systems  All other systems reviewed and are negative.    Physical Exam Triage Vital Signs ED Triage Vitals  Enc Vitals Group     BP 01/06/19 1436 114/67     Pulse Rate 01/06/19 1436 78     Resp 01/06/19 1436 18     Temp 01/06/19 1436 98.3 F (36.8 C)     Temp Source 01/06/19 1436 Oral     SpO2 01/06/19 1436 99 %     Weight --      Height --      Pain Score 01/06/19 1437 0     Pain Loc --    Updated Vital Signs BP 114/67 (BP Location: Right Arm)   Pulse 78   Temp 98.3 F (36.8 C) (Oral)   Resp 18   LMP 01/02/2019   SpO2 99%  Physical Exam Vitals signs and nursing note reviewed.  Constitutional:      General: She is not in acute distress.    Comments: Alert, nicely groomed  HENT:     Head: Atraumatic.  Eyes:     Comments: Conjugate gaze, mild right conjunctival injection, not apparent on the left.  Both eyes are watery.  No purulent discharge appreciated. Bilateral upper lids are puffy, slightly red, with thickened margins.  Neck:     Musculoskeletal: Neck supple.  Cardiovascular:     Rate and Rhythm: Normal rate.  Pulmonary:     Effort: No respiratory distress.  Abdominal:     General: There is no distension.  Musculoskeletal: Normal range of motion.     Comments: No leg swelling  Skin:    General: Skin  is warm and dry.      Comments: No cyanosis  Neurological:     Mental Status: She is alert and oriented to person, place, and time.      Final Clinical Impressions(s) / UC Diagnoses   Final diagnoses:  Eczematous dermatitis of upper eyelids of both eyes     Discharge Instructions     No danger signs on exam.  Eyelid puffiness/irritation is likely secondary to irritation from the eyelash adhesive, and will gradually improve over the next several days.  May take a couple weeks to completely resolve.  Cool compresses to eyelids for 5-10 minutes several times daily should help decrease swelling/irritation.  Prescription for a mild steroid ointment, to apply in a thin layer to eyelids twice daily for the next week or two, was sent to the pharmacy.  The prescription for prednisone, sent by your GI doctor for ulcerative colitis, will also likely help with eyelid puffiness/irritation.  Recheck if vision loss or marked increase in eye pain occurs.   ED Prescriptions    Medication Sig Dispense Auth. Provider   alclomethasone (ACLOVATE) 0.05 % ointment Apply topically 2 (two) times daily. 30 g Wynona Luna, MD        Wynona Luna, MD 01/12/19 [phone]

## 2019-01-06 NOTE — ED Triage Notes (Signed)
Pt presents with swelling to both to both eyes after having lash extensions done on Friday. Pt has no complaints of irritation or pain, just swelling.

## 2019-01-06 NOTE — Discharge Instructions (Signed)
No danger signs on exam.  Eyelid puffiness/irritation is likely secondary to irritation from the eyelash adhesive, and will gradually improve over the next several days.  May take a couple weeks to completely resolve.  Cool compresses to eyelids for 5-10 minutes several times daily should help decrease swelling/irritation.  Prescription for a mild steroid ointment, to apply in a thin layer to eyelids twice daily for the next week or two, was sent to the pharmacy.  The prescription for prednisone, sent by your GI doctor for ulcerative colitis, will also likely help with eyelid puffiness/irritation.  Recheck if vision loss or marked increase in eye pain occurs.

## 2019-01-06 NOTE — Telephone Encounter (Signed)
Thomas about Prednisone prescription. I discussed this prescription on 1/29 with a pharmacist named Threasa Beards but prescription was never filled... Pharmacy called back stating that the prednisone cant be broken down in fourths so they would have to give the patient 5 mg tablets for her complete taper. I told them that was fine   Dr Darleen Crocker

## 2019-01-07 NOTE — Telephone Encounter (Signed)
Ok, thanks.

## 2019-01-10 ENCOUNTER — Ambulatory Visit: Payer: Medicaid Other | Admitting: Gastroenterology

## 2019-01-27 ENCOUNTER — Other Ambulatory Visit (INDEPENDENT_AMBULATORY_CARE_PROVIDER_SITE_OTHER): Payer: Medicaid Other

## 2019-01-27 ENCOUNTER — Ambulatory Visit (INDEPENDENT_AMBULATORY_CARE_PROVIDER_SITE_OTHER): Payer: Medicaid Other | Admitting: Gastroenterology

## 2019-01-27 ENCOUNTER — Encounter: Payer: Self-pay | Admitting: Gastroenterology

## 2019-01-27 VITALS — BP 120/88 | HR 66 | Ht 59.0 in | Wt 141.1 lb

## 2019-01-27 DIAGNOSIS — K5180 Other ulcerative colitis without complications: Secondary | ICD-10-CM

## 2019-01-27 DIAGNOSIS — K5909 Other constipation: Secondary | ICD-10-CM

## 2019-01-27 LAB — C-REACTIVE PROTEIN: CRP: <1 mg/dL (ref 0.5–20.0)

## 2019-01-27 MED ORDER — POLYETHYLENE GLYCOL 3350 17 GM/SCOOP PO POWD: ORAL | 3 refills | Status: DC

## 2019-01-27 NOTE — Patient Instructions (Signed)
Take Miralax daily  Go to the basement for labs today  Follow up in 3 months  If you are age 34 or older, your body mass index should be between 23-30. Your Body mass index is 28.5 kg/m. If this is out of the aforementioned range listed, please consider follow up with your Primary Care Provider.  If you are age 63 or younger, your body mass index should be between 19-25. Your Body mass index is 28.5 kg/m. If this is out of the aformentioned range listed, please consider follow up with your Primary Care Provider.    I appreciate the  opportunity to care for you  Thank You   Harl Bowie , MD

## 2019-01-27 NOTE — Progress Notes (Signed)
Joyce Solomon    [id number]    02/19/1986  Primary Care Physician:System, Provider Not In  Referring Physician: No referring provider defined for this encounter.  Chief complaint: Ulcerative colitis, constipation HPI:  33 year old female with history of ulcerative colitis on Humira maintenance dose here for follow-up visit  She was last seen December 17, 2018.  Was started on prednisone for possible ulcerative colitis flare flare given history of rectal bleeding, diarrhea and abdominal discomfort.  She did not tolerate prednisone and self discontinued after 2 weeks.  She has missed few doses of Humira few weeks ago, currently is compliant and patient denies missing any doses.  Drug trough 5.9 with undetectable antibody for Humira  Rectal bleeding resolved.  She is having 1 formed bowel movement almost daily.  Not taking any laxatives.  She continues to strain especially with hard stool intermittently.  Denies any nausea, vomiting, abdominal pain, melena or bright red blood per rectum.  No loss of appetite or weight loss.   Relevant GI history: Colonoscopy June 2015 showed moderately severe left-sided colitis Allergic reaction to Remicade in 2016. Elevated antibody level to infliximab 1622 and undetectable drug level Flexible sigmoidoscopy January 2017 showed severe colitis left colon with ulceration, biopsies showed moderately active colitis. Started on Humira March 2017, and clinical remission since then Colonoscopy Apr 10, 2018 negative for active colitis, chronic mucosal changes with altered vascularity and mild erythema. Random biopsies negative for active inflammation.   Outpatient Encounter Medications as of 01/27/2019  Medication Sig  . alclomethasone (ACLOVATE) 0.05 % ointment Apply topically 2 (two) times daily.  [name] HUMIRA PEN 40 MG/0.4ML PNKT INJECT 40 MG INTO THE SKIN EVERY 14 (FOURTEEN) DAYS.  . [DISCONTINUED] linaclotide (LINZESS) 145 MCG CAPS  capsule Take 1 capsule (145 mcg total) by mouth daily before breakfast.  . [DISCONTINUED] predniSONE (DELTASONE) 20 MG tablet Take 2 tablets (40 mg total) by mouth daily with breakfast. Patient will do 40 mg daily for 1 week then taper by 5's weekly until comleted taper   No facility-administered encounter medications on file as of 01/27/2019.     Allergies as of 01/27/2019 - Review Complete 01/27/2019  Allergen Reaction Noted  . Latex Rash 05/20/2009  . Remicade [infliximab] Itching 08/07/2015    Past Medical History:  Diagnosis Date  . Allergy    SEASONAL  . Iron Deficiency Anemia 2008   s/p transfusions 2009, 2010; inconsistent compliance with PO iron.   . Non compliance w medication regimen 06/2015   stopped Remicade, may not have been taking Azulfidine.  [name] Ulcerative Colitis 2007   on Humira    Past Surgical History:  Procedure Laterality Date  . CESAREAN SECTION N/A 12/09/2013   Procedure: Primary Cesarean Section Delivery Baby Girl @ 1959, Apgars 8/8;  Surgeon: Logan Bores, MD;  Location: [facility];  Service: Obstetrics;  Laterality: N/A;  . CESAREAN SECTION N/A 08/14/2017   Procedure: CESAREAN SECTION;  Surgeon: Paula Compton, MD;  Location: [facility];  Service: Obstetrics;  Laterality: N/A;  . COLONOSCOPY  01/19/2011   Dr Penelope Coop. no active UC  . COLONOSCOPY W/ BIOPSIES N/A 04/2014   Dr Deatra Ina: Left sided colitis from prox descending to rectum; no inflammation at or proximal to splenic flexure  . DILATION AND EVACUATION  08/21/2012   Procedure: DILATATION AND EVACUATION;  Surgeon: Logan Bores, MD;  Location: [facility];  Service: Gynecology  . TONSILLECTOMY  Family History  Problem Relation Age of Onset  . Miscarriages / Stillbirths Other   . Hypotension Neg Hx   . Alcohol abuse Neg Hx   . Arthritis Neg Hx   . Asthma Neg Hx   . Birth defects Neg Hx   . COPD Neg Hx   . Depression Neg Hx   . Diabetes Neg Hx   . Drug abuse Neg Hx   . Hearing  loss Neg Hx   . Heart disease Neg Hx   . Hyperlipidemia Neg Hx   . Hypertension Neg Hx   . Kidney disease Neg Hx   . Learning disabilities Neg Hx   . Mental illness Neg Hx   . Mental retardation Neg Hx   . Colon cancer Neg Hx   . Colon polyps Neg Hx   . Esophageal cancer Neg Hx   . Stomach cancer Neg Hx   . Rectal cancer Neg Hx     Social History   Socioeconomic History  . Marital status: Single    Spouse name: Not on file  . Number of children: 2  . Years of education: Not on file  . Highest education level: Not on file  Occupational History  . Occupation: unemployed  Social Needs  . Financial resource strain: Not on file  . Food insecurity:    Worry: Not on file    Inability: Not on file  . Transportation needs:    Medical: Not on file    Non-medical: Not on file  Tobacco Use  . Smoking status: Light Tobacco Smoker    Years: 3.00  . Smokeless tobacco: Never Used  . Tobacco comment: smokes a cig "every blue moon"  Substance and Sexual Activity  . Alcohol use: Yes    Alcohol/week: 0.0 standard drinks    Comment: occassional  . Drug use: No  . Sexual activity: Yes    Birth control/protection: None, Condom  Lifestyle  . Physical activity:    Days per week: Not on file    Minutes per session: Not on file  . Stress: Not on file  Relationships  . Social connections:    Talks on phone: Not on file    Gets together: Not on file    Attends religious service: Not on file    Active member of club or organization: Not on file    Attends meetings of clubs or organizations: Not on file    Relationship status: Not on file  . Intimate partner violence:    Fear of current or ex partner: Not on file    Emotionally abused: Not on file    Physically abused: Not on file    Forced sexual activity: Not on file  Other Topics Concern  . Not on file  Social History Narrative  . Not on file      Review of systems: Review of Systems  Constitutional: Negative for fever  and chills.  HENT: Negative.   Eyes: Negative for blurred vision.  Respiratory: Negative for cough, shortness of breath and wheezing.   Cardiovascular: Negative for chest pain and palpitations.  Gastrointestinal: as per HPI Genitourinary: Negative for dysuria, urgency, frequency and hematuria.  Musculoskeletal: Positive for myalgias, back pain and joint pain.  Skin: Negative for itching and rash.  Neurological: Negative for dizziness, tremors, focal weakness, seizures and loss of consciousness.  Endo/Heme/Allergies: Negative for seasonal allergies.  Psychiatric/Behavioral: Negative for depression, suicidal ideas and hallucinations.  All other systems reviewed and are negative.  Physical Exam: Vitals:   01/27/19 1509  BP: 120/88  Pulse: 66   Body mass index is 28.5 kg/m. Gen:      No acute distress HEENT:  EOMI, sclera anicteric Neck:     No masses; no thyromegaly Lungs:    Clear to auscultation bilaterally; normal respiratory effort CV:         Regular rate and rhythm; no murmurs Abd:      + bowel sounds; soft, non-tender; no palpable masses, no distension Ext:    No edema; adequate peripheral perfusion Skin:      Warm and dry; no rash Neuro: alert and oriented x 3 Psych: normal mood and affect  Data Reviewed:  Reviewed labs, radiology imaging, old records and pertinent past GI work up   Assessment and Plan/Recommendations:  33 year old female with history of ulcerative colitis, left-sided, recent flare of symptoms likely secondary to noncompliance to Humira. Advised patient to adhere to the dosing regimen Humira injection every 2 weeks Check CRP  Constipation : Increase dietary fiber and fluid intake Start MiraLAX 1 capful daily as needed to prevent constipation  Return in 2 to 3 months or sooner if needed  25 minutes was spent face-to-face with the patient. Greater than 50% of the time used for counseling as well as treatment plan and follow-up. She had multiple  questions which were answered to her satisfaction  K. Denzil Magnuson , MD [phone]    CC: No ref. provider found

## 2019-01-28 ENCOUNTER — Encounter: Payer: Self-pay | Admitting: Gastroenterology

## 2019-02-05 ENCOUNTER — Emergency Department (HOSPITAL_COMMUNITY)
Admission: EM | Admit: 2019-02-05 | Discharge: 2019-02-05 | Disposition: A | Discharge status: Home / Self Care | Payer: Medicaid Other | Attending: Emergency Medicine | Admitting: Emergency Medicine

## 2019-02-05 ENCOUNTER — Other Ambulatory Visit: Payer: Self-pay

## 2019-02-05 ENCOUNTER — Encounter (HOSPITAL_COMMUNITY): Payer: Self-pay | Admitting: Emergency Medicine

## 2019-02-05 DIAGNOSIS — Z720 Tobacco use: Secondary | ICD-10-CM | POA: Diagnosis not present

## 2019-02-05 DIAGNOSIS — Z91040 Latex allergy status: Secondary | ICD-10-CM | POA: Insufficient documentation

## 2019-02-05 DIAGNOSIS — Z79899 Other long term (current) drug therapy: Secondary | ICD-10-CM | POA: Insufficient documentation

## 2019-02-05 DIAGNOSIS — H10023 Other mucopurulent conjunctivitis, bilateral: Secondary | ICD-10-CM | POA: Insufficient documentation

## 2019-02-05 DIAGNOSIS — H1033 Unspecified acute conjunctivitis, bilateral: Secondary | ICD-10-CM

## 2019-02-05 DIAGNOSIS — H5789 Other specified disorders of eye and adnexa: Secondary | ICD-10-CM | POA: Diagnosis present

## 2019-02-05 MED ORDER — ERYTHROMYCIN 5 MG/GM OP OINT: TOPICAL_OINTMENT | OPHTHALMIC | 0 refills | Status: DC

## 2019-02-05 NOTE — ED Triage Notes (Signed)
Patient c/o itching to bilateral eyes with clear drainage since yesterday.

## 2019-02-05 NOTE — ED Provider Notes (Signed)
[facility] DEPT Provider Note   CSN: [id number] Arrival date & time: 02/05/19  2107    History   Chief Complaint Chief Complaint  Patient presents with  . Eye Drainage    HPI Joyce Solomon is a 33 y.o. female.     33 yo F with a chief complaint of bilateral itchy eyes and drainage.  Going on for the past few days.  She was allowed to go back to work until she saw Dr.  Denies cough congestion or fever denies vomiting or diarrhea.  The history is provided by the patient.  Illness  Severity:  Mild Onset quality:  Sudden Duration:  2 days Timing:  Constant Progression:  Worsening Chronicity:  New Associated symptoms: no chest pain, no congestion, no fever, no headaches, no myalgias, no nausea, no rhinorrhea, no shortness of breath, no vomiting and no wheezing     Past Medical History:  Diagnosis Date  . Allergy    SEASONAL  . Iron Deficiency Anemia 2008   s/p transfusions 2009, 2010; inconsistent compliance with PO iron.   . Non compliance w medication regimen 06/2015   stopped Remicade, may not have been taking Azulfidine.  [name] Ulcerative Colitis 2007   on Humira    Patient Active Problem List   Diagnosis Date Noted  . Anal fissure 09/11/2018  . S/P repeat low transverse C-section 08/15/2017  . Term pregnancy 08/14/2017  . Rectal bleeding 04/23/2014  . Status post C-section 12/10/2013  . IUGR (intrauterine growth restriction) 12/09/2013  . DENTAL CARIES 12/22/2009  . HYPERTHYROIDISM 08/03/2008  . Constipation 07/29/2008  . ACNE, MILD 07/29/2008  . Anemia 06/30/2008  . Ulcerative colitis ([place]) 11/20/2005    Past Surgical History:  Procedure Laterality Date  . CESAREAN SECTION N/A 12/09/2013   Procedure: Primary Cesarean Section Delivery Baby Girl @ 1959, Apgars 8/8;  Surgeon: Logan Bores, MD;  Location: [facility];  Service: Obstetrics;  Laterality: N/A;  . CESAREAN SECTION N/A 08/14/2017   Procedure: CESAREAN SECTION;   Surgeon: Paula Compton, MD;  Location: [facility];  Service: Obstetrics;  Laterality: N/A;  . COLONOSCOPY  01/19/2011   Dr Penelope Coop. no active UC  . COLONOSCOPY W/ BIOPSIES N/A 04/2014   Dr Deatra Ina: Left sided colitis from prox descending to rectum; no inflammation at or proximal to splenic flexure  . DILATION AND EVACUATION  08/21/2012   Procedure: DILATATION AND EVACUATION;  Surgeon: Logan Bores, MD;  Location: [facility];  Service: Gynecology  . TONSILLECTOMY       OB History    Gravida  5   Para  3   Term  3   Preterm      AB  2   Living  3     SAB  1   TAB  1   Ectopic      Multiple  0   Live Births  3            Home Medications    Prior to Admission medications   Medication Sig Start Date End Date Taking? Authorizing Provider  alclomethasone (ACLOVATE) 0.05 % ointment Apply topically 2 (two) times daily. 01/06/19   Wynona Luna, MD  erythromycin ophthalmic ointment Place a 1/2 inch ribbon of ointment into the lower eyelid four times a day for the next 7 days. 02/05/19   Deno Etienne, DO  HUMIRA PEN 40 MG/0.4ML PNKT INJECT 40 MG INTO THE SKIN EVERY 14 (FOURTEEN) DAYS. 09/26/18  Mauri Pole, MD  polyethylene glycol powder (GLYCOLAX/MIRALAX) powder Take 1 capful daily 01/27/19   Mauri Pole, MD    Family History Family History  Problem Relation Age of Onset  . Miscarriages / Stillbirths Other   . Hypotension Neg Hx   . Alcohol abuse Neg Hx   . Arthritis Neg Hx   . Asthma Neg Hx   . Birth defects Neg Hx   . COPD Neg Hx   . Depression Neg Hx   . Diabetes Neg Hx   . Drug abuse Neg Hx   . Hearing loss Neg Hx   . Heart disease Neg Hx   . Hyperlipidemia Neg Hx   . Hypertension Neg Hx   . Kidney disease Neg Hx   . Learning disabilities Neg Hx   . Mental illness Neg Hx   . Mental retardation Neg Hx   . Colon cancer Neg Hx   . Colon polyps Neg Hx   . Esophageal cancer Neg Hx   . Stomach cancer Neg Hx   . Rectal cancer  Neg Hx     Social History Social History   Tobacco Use  . Smoking status: Light Tobacco Smoker    Years: 3.00  . Smokeless tobacco: Never Used  . Tobacco comment: smokes a cig "every blue moon"  Substance Use Topics  . Alcohol use: Yes    Alcohol/week: 0.0 standard drinks    Comment: occassional  . Drug use: No     Allergies   Latex and Remicade [infliximab]   Review of Systems Review of Systems  Constitutional: Negative for chills and fever.  HENT: Negative for congestion and rhinorrhea.   Eyes: Positive for discharge, redness and itching. Negative for visual disturbance.  Respiratory: Negative for shortness of breath and wheezing.   Cardiovascular: Negative for chest pain and palpitations.  Gastrointestinal: Negative for nausea and vomiting.  Genitourinary: Negative for dysuria and urgency.  Musculoskeletal: Negative for arthralgias and myalgias.  Skin: Negative for pallor and wound.  Neurological: Negative for dizziness and headaches.     Physical Exam Updated Vital Signs BP 102/67 (BP Location: Right Arm)   Pulse (!) 18   Temp 98.4 F (36.9 C) (Oral)   Resp 18   LMP 01/27/2019   SpO2 100%   Physical Exam Vitals signs and nursing note reviewed.  Constitutional:      General: She is not in acute distress.    Appearance: She is well-developed. She is not diaphoretic.  HENT:     Head: Normocephalic and atraumatic.     Comments: Swollen turbinates posterior nasal drip.  Bilateral conjunctival injection with whitish exudates. Eyes:     Pupils: Pupils are equal, round, and reactive to light.  Neck:     Musculoskeletal: Normal range of motion and neck supple.  Cardiovascular:     Rate and Rhythm: Normal rate and regular rhythm.     Heart sounds: No murmur. No friction rub. No gallop.   Pulmonary:     Effort: Pulmonary effort is normal.     Breath sounds: No wheezing or rales.  Abdominal:     General: There is no distension.     Palpations: Abdomen is  soft.     Tenderness: There is no abdominal tenderness.  Musculoskeletal:        General: No tenderness.  Skin:    General: Skin is warm and dry.  Neurological:     Mental Status: She is alert and oriented to person, place,  and time.  Psychiatric:        Behavior: Behavior normal.      ED Treatments / Results  Labs (all labs ordered are listed, but only abnormal results are displayed) Labs Reviewed - No data to display  EKG None  Radiology No results found.  Procedures Procedures (including critical care time)  Medications Ordered in ED Medications - No data to display   Initial Impression / Assessment and Plan / ED Course  I have reviewed the triage vital signs and the nursing notes.  Pertinent labs & imaging results that were available during my care of the patient were reviewed by me and considered in my medical decision making (see chart for details).        33 yo F with a chief complaints of bilateral eye irritation and drainage.  She does have some purulent appearing discharge.  Will start on erythromycin ophthalmic ointment.  No contact lens use.  Discharge home.  9:45 PM:  I have discussed the diagnosis/risks/treatment options with the patient and family and believe the pt to be eligible for discharge home to follow-up with PCP. We also discussed returning to the ED immediately if new or worsening sx occur. We discussed the sx which are most concerning (e.g., sudden worsening pain, fever, inability to tolerate by mouth) that necessitate immediate return. Medications administered to the patient during their visit and any new prescriptions provided to the patient are listed below.  Medications given during this visit Medications - No data to display   The patient appears reasonably screen and/or stabilized for discharge and I doubt any other medical condition or other [facility] requiring further screening, evaluation, or treatment in the ED at this time prior to  discharge.    Final Clinical Impressions(s) / ED Diagnoses   Final diagnoses:  Acute bacterial conjunctivitis of both eyes    ED Discharge Orders         Ordered    erythromycin ophthalmic ointment     02/05/19 2143           Deno Etienne, DO 02/05/19 2146

## 2019-02-05 NOTE — Discharge Instructions (Signed)
Do not rub your eyes.  Use warm compresses instead.  Follow up with your family doc.

## 2019-03-31 ENCOUNTER — Other Ambulatory Visit: Payer: Self-pay | Admitting: Gastroenterology

## 2019-04-14 ENCOUNTER — Other Ambulatory Visit: Payer: Self-pay

## 2019-04-14 ENCOUNTER — Encounter (HOSPITAL_COMMUNITY): Payer: Self-pay | Admitting: Emergency Medicine

## 2019-04-14 ENCOUNTER — Emergency Department (HOSPITAL_COMMUNITY)
Admission: EM | Admit: 2019-04-14 | Discharge: 2019-04-14 | Disposition: A | Discharge status: Home / Self Care | Payer: Medicaid Other | Attending: Emergency Medicine | Admitting: Emergency Medicine

## 2019-04-14 DIAGNOSIS — R0602 Shortness of breath: Secondary | ICD-10-CM | POA: Diagnosis present

## 2019-04-14 DIAGNOSIS — Z79899 Other long term (current) drug therapy: Secondary | ICD-10-CM | POA: Insufficient documentation

## 2019-04-14 DIAGNOSIS — T50901A Poisoning by unspecified drugs, medicaments and biological substances, accidental (unintentional), initial encounter: Secondary | ICD-10-CM

## 2019-04-14 DIAGNOSIS — R002 Palpitations: Secondary | ICD-10-CM | POA: Diagnosis not present

## 2019-04-14 DIAGNOSIS — T450X1A Poisoning by antiallergic and antiemetic drugs, accidental (unintentional), initial encounter: Secondary | ICD-10-CM | POA: Insufficient documentation

## 2019-04-14 DIAGNOSIS — Z91040 Latex allergy status: Secondary | ICD-10-CM | POA: Diagnosis not present

## 2019-04-14 LAB — CBC WITH DIFFERENTIAL/PLATELET
Abs Immature Granulocytes: 0.03 10*3/uL (ref 0.00–0.07)
Basophils Absolute: 0 10*3/uL (ref 0.0–0.1)
Basophils Relative: 0 %
Eosinophils Absolute: 0.4 10*3/uL (ref 0.0–0.5)
Eosinophils Relative: 4 %
HCT: 41.9 % (ref 36.0–46.0)
Hemoglobin: 13.5 g/dL (ref 12.0–15.0)
Immature Granulocytes: 0 %
Lymphocytes Relative: 34 %
Lymphs Abs: 3.5 10*3/uL (ref 0.7–4.0)
MCH: 31.3 pg (ref 26.0–34.0)
MCHC: 32.2 g/dL (ref 30.0–36.0)
MCV: 97.2 fL (ref 80.0–100.0)
Monocytes Absolute: 1.3 10*3/uL — ABNORMAL HIGH (ref 0.1–1.0)
Monocytes Relative: 12 %
Neutro Abs: 5.1 10*3/uL (ref 1.7–7.7)
Neutrophils Relative %: 50 %
Platelets: 328 10*3/uL (ref 150–400)
RBC: 4.31 MIL/uL (ref 3.87–5.11)
RDW: 13.7 % (ref 11.5–15.5)
WBC: 10.3 10*3/uL (ref 4.0–10.5)
nRBC: 0 % (ref 0.0–0.2)

## 2019-04-14 LAB — BASIC METABOLIC PANEL
Anion gap: 12 (ref 5–15)
BUN: 7 mg/dL (ref 6–20)
CO2: 21 mmol/L — ABNORMAL LOW (ref 22–32)
Calcium: 9.2 mg/dL (ref 8.9–10.3)
Chloride: 106 mmol/L (ref 98–111)
Creatinine, Ser: 0.58 mg/dL (ref 0.44–1.00)
GFR calc Af Amer: >60 mL/min (ref >60)
GFR calc non Af Amer: >60 mL/min (ref >60)
Glucose, Bld: 94 mg/dL (ref 70–99)
Potassium: 3.8 mmol/L (ref 3.5–5.1)
Sodium: 139 mmol/L (ref 135–145)

## 2019-04-14 LAB — ACETAMINOPHEN LEVEL: Acetaminophen (Tylenol), Serum: <10 ug/mL — ABNORMAL LOW (ref 10–30)

## 2019-04-14 LAB — I-STAT BETA HCG BLOOD, ED (MC, WL, AP ONLY): I-stat hCG, quantitative: 8.4 m[IU]/mL — ABNORMAL HIGH (ref <5)

## 2019-04-14 LAB — SALICYLATE LEVEL: Salicylate Lvl: <7 mg/dL (ref 2.8–30.0)

## 2019-04-14 NOTE — ED Triage Notes (Signed)
Pt in with c/o sob and "trouble taking deep breaths" after taking too much ZQuil after work this am at 0900. States she took 1.5 medicine cups worth. sats 99% in triage

## 2019-04-14 NOTE — ED Notes (Signed)
Pts HR increased to 145 bpm. Printed new EKG and MD notified.

## 2019-04-14 NOTE — Discharge Instructions (Signed)
You were seen in the emergency department for concerns of overdose on a sleep medication.  You were observed here for 4 hours and showed no signs of any worsening symptoms.  It will be important for you to take the medication only as prescribed.  Please rest and drink plenty of fluids.  Return if any concerns.

## 2019-04-14 NOTE — ED Notes (Signed)
Patient verbalizes understanding of discharge instructions. Opportunity for questioning and answers were provided. Armband removed by staff, pt discharged from ED.  

## 2019-04-14 NOTE — ED Provider Notes (Signed)
[facility] EMERGENCY DEPARTMENT Provider Note   CSN: [id number] Arrival date & time: 04/14/19  1103    History   Chief Complaint Chief Complaint  Patient presents with  . ZQuil overdose    HPI Joyce Solomon is a 33 y.o. female.  She is presenting to the emergency department complaining of feeling short of breath and difficulty taking a deep breath that started about 20 minutes after taking ZzzQuil to help her sleep.  She said she took 1-1/2 capfuls in order to fall asleep faster.  She is use this medication before but never taken this much.  She works the night shift and was having difficulty getting to sleep.  She denies this was an attempt to hurt herself.  There was no vomiting.  She said she took this around 8 or 9 AM.  She denies any alcohol or other ingestions.  No SI or HI.  No prior psychiatric illness     The history is provided by the patient.  Drug Overdose  This is a new problem. The current episode started 3 to 5 hours ago. The problem has not changed since onset.Associated symptoms include chest pain and shortness of breath. Pertinent negatives include no abdominal pain and no headaches. Nothing aggravates the symptoms. Nothing relieves the symptoms. She has tried nothing for the symptoms. The treatment provided no relief.    Past Medical History:  Diagnosis Date  . Allergy    SEASONAL  . Iron Deficiency Anemia 2008   s/p transfusions 2009, 2010; inconsistent compliance with PO iron.   . Non compliance w medication regimen 06/2015   stopped Remicade, may not have been taking Azulfidine.  [name] Ulcerative Colitis 2007   on Humira    Patient Active Problem List   Diagnosis Date Noted  . Anal fissure 09/11/2018  . S/P repeat low transverse C-section 08/15/2017  . Term pregnancy 08/14/2017  . Rectal bleeding 04/23/2014  . Status post C-section 12/10/2013  . IUGR (intrauterine growth restriction) 12/09/2013  . DENTAL CARIES 12/22/2009  .  HYPERTHYROIDISM 08/03/2008  . Constipation 07/29/2008  . ACNE, MILD 07/29/2008  . Anemia 06/30/2008  . Ulcerative colitis ([place]) 11/20/2005    Past Surgical History:  Procedure Laterality Date  . CESAREAN SECTION N/A 12/09/2013   Procedure: Primary Cesarean Section Delivery Baby Girl @ 1959, Apgars 8/8;  Surgeon: Logan Bores, MD;  Location: [facility];  Service: Obstetrics;  Laterality: N/A;  . CESAREAN SECTION N/A 08/14/2017   Procedure: CESAREAN SECTION;  Surgeon: Paula Compton, MD;  Location: [facility];  Service: Obstetrics;  Laterality: N/A;  . COLONOSCOPY  01/19/2011   Dr Penelope Coop. no active UC  . COLONOSCOPY W/ BIOPSIES N/A 04/2014   Dr Deatra Ina: Left sided colitis from prox descending to rectum; no inflammation at or proximal to splenic flexure  . DILATION AND EVACUATION  08/21/2012   Procedure: DILATATION AND EVACUATION;  Surgeon: Logan Bores, MD;  Location: [facility];  Service: Gynecology  . TONSILLECTOMY       OB History    Gravida  5   Para  3   Term  3   Preterm      AB  2   Living  3     SAB  1   TAB  1   Ectopic      Multiple  0   Live Births  3            Home Medications    Prior  to Admission medications   Medication Sig Start Date End Date Taking? Authorizing Provider  alclomethasone (ACLOVATE) 0.05 % ointment Apply topically 2 (two) times daily. 01/06/19   Wynona Luna, MD  erythromycin ophthalmic ointment Place a 1/2 inch ribbon of ointment into the lower eyelid four times a day for the next 7 days. 02/05/19   Deno Etienne, DO  HUMIRA PEN 40 MG/0.4ML PNKT INJECT 40 MG INTO THE SKIN EVERY 14 (FOURTEEN) DAYS. 03/31/19   Mauri Pole, MD  polyethylene glycol powder (GLYCOLAX/MIRALAX) powder Take 1 capful daily 01/27/19   Mauri Pole, MD    Family History Family History  Problem Relation Age of Onset  . Miscarriages / Stillbirths Other   . Hypotension Neg Hx   . Alcohol abuse Neg Hx   . Arthritis Neg Hx   .  Asthma Neg Hx   . Birth defects Neg Hx   . COPD Neg Hx   . Depression Neg Hx   . Diabetes Neg Hx   . Drug abuse Neg Hx   . Hearing loss Neg Hx   . Heart disease Neg Hx   . Hyperlipidemia Neg Hx   . Hypertension Neg Hx   . Kidney disease Neg Hx   . Learning disabilities Neg Hx   . Mental illness Neg Hx   . Mental retardation Neg Hx   . Colon cancer Neg Hx   . Colon polyps Neg Hx   . Esophageal cancer Neg Hx   . Stomach cancer Neg Hx   . Rectal cancer Neg Hx     Social History Social History   Tobacco Use  . Smoking status: Light Tobacco Smoker    Years: 3.00  . Smokeless tobacco: Never Used  . Tobacco comment: smokes a cig "every blue moon"  Substance Use Topics  . Alcohol use: Yes    Alcohol/week: 0.0 standard drinks    Comment: occassional  . Drug use: No     Allergies   Latex and Remicade [infliximab]   Review of Systems Review of Systems  Constitutional: Negative for fever.  HENT: Negative for sore throat.   Eyes: Negative for visual disturbance.  Respiratory: Positive for shortness of breath.   Cardiovascular: Positive for chest pain.  Gastrointestinal: Negative for abdominal pain.  Genitourinary: Negative for dysuria.  Musculoskeletal: Negative for neck pain.  Skin: Negative for rash.  Neurological: Negative for headaches.     Physical Exam Updated Vital Signs BP 123/69 (BP Location: Right Arm)   Pulse 87   Temp 97.9 F (36.6 C) (Oral)   Resp 11   Wt 64 kg   SpO2 100%   BMI 28.50 kg/m   Physical Exam Vitals signs and nursing note reviewed.  Constitutional:      General: She is not in acute distress.    Appearance: She is well-developed.  HENT:     Head: Normocephalic and atraumatic.  Eyes:     Conjunctiva/sclera: Conjunctivae normal.  Neck:     Musculoskeletal: Neck supple.  Cardiovascular:     Rate and Rhythm: Normal rate and regular rhythm.     Pulses: Normal pulses.     Heart sounds: No murmur.  Pulmonary:     Effort:  Pulmonary effort is normal. No respiratory distress.     Breath sounds: Normal breath sounds.  Abdominal:     Palpations: Abdomen is soft.     Tenderness: There is no abdominal tenderness.  Musculoskeletal: Normal range of motion.  Right lower leg: No edema.     Left lower leg: No edema.  Skin:    General: Skin is warm and dry.     Capillary Refill: Capillary refill takes less than 2 seconds.  Neurological:     General: No focal deficit present.     Mental Status: She is alert and oriented to person, place, and time.  Psychiatric:        Attention and Perception: Attention normal.        Mood and Affect: Mood normal.        Speech: Speech normal.        Behavior: Behavior normal. Behavior is cooperative.        Thought Content: Thought content is not delusional. Thought content does not include homicidal or suicidal ideation. Thought content does not include homicidal or suicidal plan.      ED Treatments / Results  Labs (all labs ordered are listed, but only abnormal results are displayed) Labs Reviewed  ACETAMINOPHEN LEVEL - Abnormal; Notable for the following components:      Result Value   Acetaminophen (Tylenol), Serum <10 (*)    All other components within normal limits  BASIC METABOLIC PANEL - Abnormal; Notable for the following components:   CO2 21 (*)    All other components within normal limits  CBC WITH DIFFERENTIAL/PLATELET - Abnormal; Notable for the following components:   Monocytes Absolute 1.3 (*)    All other components within normal limits  I-STAT BETA HCG BLOOD, ED ([facility], [facility], [facility] ONLY) - Abnormal; Notable for the following components:   I-stat hCG, quantitative 8.4 (*)    All other components within normal limits  SALICYLATE LEVEL    EKG EKG Interpretation  Date/Time:  Monday Apr 14 2019 12:15:32 EDT Ventricular Rate:  145 PR Interval:    QRS Duration: 71 QT Interval:  291 QTC Calculation: 452 R Axis:   55 Text Interpretation:  Sinus tachycardia  Anterior infarct, old Repol abnrm suggests ischemia, inferior leads Baseline wander in lead(s) II III aVL aVF increased rate from prior  Confirmed by Aletta Edouard [phone]) on 04/14/2019 12:17:32 PM Also confirmed by Aletta Edouard [phone]), editor Philomena Doheny [phone])  on 04/15/2019 7:29:01 AM   Radiology No results found.  Procedures Procedures (including critical care time)  Medications Ordered in ED Medications - No data to display   Initial Impression / Assessment and Plan / ED Course  I have reviewed the triage vital signs and the nursing notes.  Pertinent labs & imaging results that were available during my care of the patient were reviewed by me and considered in my medical decision making (see chart for details).  Clinical Course as of Apr 14 1136  Mon Apr 13, 6020  1141 33 year old female here complaining of shortness of breath and chest discomfort in the setting of taking a double dose of a sleep medication.  She denies it was an attempt to hurt herself.  Differential diagnosis includes anticholinergic overdose, medication side effect, arrhythmia.  She satting 100% and is not tachycardic.  She has a clear sensorium.  Doubt significant anticholinergic toxicity.  Will cardiac monitor and check aspirin and Tylenol along with electrolytes.   [MB]  0768 Reviewed ZzzQuil product.  Each 30 mL medicine Contains 50 mg of diphenhydramine.  If she was correct and took one and one half capful she took 75 mg of diphenhydramine.   [MB]  1216 Patient just became tachycardic.  Heart rate around 140.  Repeat EKG  done.   [MB]    Clinical Course User Index [MB] Hayden Rasmussen, MD   [name] was evaluated in Emergency Department on 04/14/2019 for the symptoms described in the history of present illness. She was evaluated in the context of the global COVID-19 pandemic, which necessitated consideration that the patient might be at risk for infection with the SARS-CoV-2 virus that  causes COVID-19. Institutional protocols and algorithms that pertain to the evaluation of patients at risk for COVID-19 are in a state of rapid change based on information released by regulatory bodies including the CDC and federal and state organizations. These policies and algorithms were followed during the patient's care in the ED.     Final Clinical Impressions(s) / ED Diagnoses   Final diagnoses:  Accidental drug overdose, initial encounter  Palpitations    ED Discharge Orders    None       Hayden Rasmussen, MD 04/15/19 1137

## 2019-07-27 ENCOUNTER — Other Ambulatory Visit: Payer: Self-pay

## 2019-07-27 ENCOUNTER — Encounter (HOSPITAL_COMMUNITY): Payer: Self-pay

## 2019-07-27 ENCOUNTER — Emergency Department (HOSPITAL_COMMUNITY)
Admission: EM | Admit: 2019-07-27 | Discharge: 2019-07-27 | Disposition: A | Discharge status: Home / Self Care | Payer: Medicaid Other | Attending: Emergency Medicine | Admitting: Emergency Medicine

## 2019-07-27 DIAGNOSIS — R51 Headache: Secondary | ICD-10-CM | POA: Insufficient documentation

## 2019-07-27 DIAGNOSIS — R519 Headache, unspecified: Secondary | ICD-10-CM

## 2019-07-27 DIAGNOSIS — Z79899 Other long term (current) drug therapy: Secondary | ICD-10-CM | POA: Insufficient documentation

## 2019-07-27 DIAGNOSIS — R109 Unspecified abdominal pain: Secondary | ICD-10-CM

## 2019-07-27 DIAGNOSIS — Z3491 Encounter for supervision of normal pregnancy, unspecified, first trimester: Secondary | ICD-10-CM

## 2019-07-27 DIAGNOSIS — O9989 Other specified diseases and conditions complicating pregnancy, childbirth and the puerperium: Secondary | ICD-10-CM | POA: Insufficient documentation

## 2019-07-27 DIAGNOSIS — R1084 Generalized abdominal pain: Secondary | ICD-10-CM | POA: Diagnosis not present

## 2019-07-27 DIAGNOSIS — Z3A00 Weeks of gestation of pregnancy not specified: Secondary | ICD-10-CM | POA: Diagnosis not present

## 2019-07-27 LAB — URINALYSIS, ROUTINE W REFLEX MICROSCOPIC
Bilirubin Urine: NEGATIVE
Glucose, UA: NEGATIVE mg/dL
Hgb urine dipstick: NEGATIVE
Ketones, ur: NEGATIVE mg/dL
Leukocytes,Ua: NEGATIVE
Nitrite: NEGATIVE
Protein, ur: NEGATIVE mg/dL
Specific Gravity, Urine: 1.004 — ABNORMAL LOW (ref 1.005–1.030)
pH: 7 (ref 5.0–8.0)

## 2019-07-27 LAB — CBC WITH DIFFERENTIAL/PLATELET
Abs Immature Granulocytes: 0.02 10*3/uL (ref 0.00–0.07)
Basophils Absolute: 0 10*3/uL (ref 0.0–0.1)
Basophils Relative: 1 %
Eosinophils Absolute: 0.3 10*3/uL (ref 0.0–0.5)
Eosinophils Relative: 3 %
HCT: 42.9 % (ref 36.0–46.0)
Hemoglobin: 13.9 g/dL (ref 12.0–15.0)
Immature Granulocytes: 0 %
Lymphocytes Relative: 38 %
Lymphs Abs: 3.2 10*3/uL (ref 0.7–4.0)
MCH: 31.8 pg (ref 26.0–34.0)
MCHC: 32.4 g/dL (ref 30.0–36.0)
MCV: 98.2 fL (ref 80.0–100.0)
Monocytes Absolute: 0.9 10*3/uL (ref 0.1–1.0)
Monocytes Relative: 10 %
Neutro Abs: 4.2 10*3/uL (ref 1.7–7.7)
Neutrophils Relative %: 48 %
Platelets: 291 10*3/uL (ref 150–400)
RBC: 4.37 MIL/uL (ref 3.87–5.11)
RDW: 13.1 % (ref 11.5–15.5)
WBC: 8.6 10*3/uL (ref 4.0–10.5)
nRBC: 0 % (ref 0.0–0.2)

## 2019-07-27 LAB — COMPREHENSIVE METABOLIC PANEL
ALT: 13 U/L (ref 0–44)
AST: 15 U/L (ref 15–41)
Albumin: 3.9 g/dL (ref 3.5–5.0)
Alkaline Phosphatase: 46 U/L (ref 38–126)
Anion gap: 4 — ABNORMAL LOW (ref 5–15)
BUN: 7 mg/dL (ref 6–20)
CO2: 21 mmol/L — ABNORMAL LOW (ref 22–32)
Calcium: 8.3 mg/dL — ABNORMAL LOW (ref 8.9–10.3)
Chloride: 110 mmol/L (ref 98–111)
Creatinine, Ser: 0.55 mg/dL (ref 0.44–1.00)
GFR calc Af Amer: >60 mL/min (ref >60)
GFR calc non Af Amer: >60 mL/min (ref >60)
Glucose, Bld: 87 mg/dL (ref 70–99)
Potassium: 3.7 mmol/L (ref 3.5–5.1)
Sodium: 135 mmol/L (ref 135–145)
Total Bilirubin: 0.4 mg/dL (ref 0.3–1.2)
Total Protein: 7.4 g/dL (ref 6.5–8.1)

## 2019-07-27 LAB — I-STAT BETA HCG BLOOD, ED (MC, WL, AP ONLY): I-stat hCG, quantitative: 203.5 m[IU]/mL — ABNORMAL HIGH (ref <5)

## 2019-07-27 MED ORDER — METOCLOPRAMIDE HCL 5 MG/ML IJ SOLN
10.0000 mg | Freq: Once | INTRAMUSCULAR | Status: AC
  Administered 2019-07-27: 08:00:00 10 mg via INTRAVENOUS
  Filled 2019-07-27: qty 2

## 2019-07-27 MED ORDER — METOCLOPRAMIDE HCL 10 MG PO TABS: 10.0000 mg | ORAL_TABLET | Freq: Four times a day (QID) | ORAL | 0 refills | Status: DC | PRN

## 2019-07-27 NOTE — ED Provider Notes (Signed)
[facility] DEPT Provider Note   CSN: [id number] Arrival date & time: 07/27/19  8270     History   Chief Complaint Chief Complaint  Patient presents with  . Headache  . Abdominal Cramping    Pregnant    HPI Joyce Solomon is a 33 y.o. female.     Patient with history of ulcerative colitis, states that she just found out that she was pregnant, last menstrual period August 11 --presents with complaint of generalized abdominal cramping and intermittent headaches.  Headaches are generalized.  She denies photo sensitivity or sound sensitivity.  No nausea, vomiting.  She denies any vaginal bleeding or discharge.  No dysuria, increased frequency urgency.  She took a Goody powder for her headache, otherwise no treatment.  States she went to the early pregnancy clinic because "my body was acting weird".      Past Medical History:  Diagnosis Date  . Allergy    SEASONAL  . Iron Deficiency Anemia 2008   s/p transfusions 2009, 2010; inconsistent compliance with PO iron.   . Non compliance w medication regimen 06/2015   stopped Remicade, may not have been taking Azulfidine.  [name] Ulcerative Colitis 2007   on Humira    Patient Active Problem List   Diagnosis Date Noted  . Anal fissure 09/11/2018  . S/P repeat low transverse C-section 08/15/2017  . Term pregnancy 08/14/2017  . Rectal bleeding 04/23/2014  . Status post C-section 12/10/2013  . IUGR (intrauterine growth restriction) 12/09/2013  . DENTAL CARIES 12/22/2009  . HYPERTHYROIDISM 08/03/2008  . Constipation 07/29/2008  . ACNE, MILD 07/29/2008  . Anemia 06/30/2008  . Ulcerative colitis ([place]) 11/20/2005    Past Surgical History:  Procedure Laterality Date  . CESAREAN SECTION N/A 12/09/2013   Procedure: Primary Cesarean Section Delivery Baby Girl @ 1959, Apgars 8/8;  Surgeon: Logan Bores, MD;  Location: [facility];  Service: Obstetrics;  Laterality: N/A;  . CESAREAN SECTION N/A 08/14/2017   Procedure: CESAREAN SECTION;  Surgeon: Paula Compton, MD;  Location: [facility];  Service: Obstetrics;  Laterality: N/A;  . COLONOSCOPY  01/19/2011   Dr Penelope Coop. no active UC  . COLONOSCOPY W/ BIOPSIES N/A 04/2014   Dr Deatra Ina: Left sided colitis from prox descending to rectum; no inflammation at or proximal to splenic flexure  . DILATION AND EVACUATION  08/21/2012   Procedure: DILATATION AND EVACUATION;  Surgeon: Logan Bores, MD;  Location: [facility];  Service: Gynecology  . TONSILLECTOMY       OB History    Gravida  5   Para  3   Term  3   Preterm      AB  2   Living  3     SAB  1   TAB  1   Ectopic      Multiple  0   Live Births  3            Home Medications    Prior to Admission medications   Medication Sig Start Date End Date Taking? Authorizing Provider  acetaminophen (TYLENOL) 500 MG tablet Take 1,000 mg by mouth every 6 (six) hours as needed for mild pain or headache.    [provider]  alclomethasone (ACLOVATE) 0.05 % ointment Apply topically 2 (two) times daily. 01/06/19   Wynona Luna, MD  erythromycin ophthalmic ointment Place a 1/2 inch ribbon of ointment into the lower eyelid four times a day for the next 7 days. Patient not  taking: Reported on 04/14/2019 02/05/19   Deno Etienne, DO  HUMIRA PEN 40 MG/0.4ML PNKT INJECT 40 MG INTO THE SKIN EVERY 14 (FOURTEEN) DAYS. Patient taking differently: Inject 40 mg into the skin every 14 (fourteen) days.  03/31/19   Mauri Pole, MD  ketoconazole (NIZORAL) 2 % shampoo Apply 1 application topically once a week. 11/26/18   [provider]  polyethylene glycol powder (GLYCOLAX/MIRALAX) powder Take 1 capful daily Patient not taking: Reported on 04/14/2019 01/27/19   Mauri Pole, MD    Family History Family History  Problem Relation Age of Onset  . Miscarriages / Stillbirths Other   . Hypotension Neg Hx   . Alcohol abuse Neg Hx   . Arthritis Neg Hx   . Asthma Neg  Hx   . Birth defects Neg Hx   . COPD Neg Hx   . Depression Neg Hx   . Diabetes Neg Hx   . Drug abuse Neg Hx   . Hearing loss Neg Hx   . Heart disease Neg Hx   . Hyperlipidemia Neg Hx   . Hypertension Neg Hx   . Kidney disease Neg Hx   . Learning disabilities Neg Hx   . Mental illness Neg Hx   . Mental retardation Neg Hx   . Colon cancer Neg Hx   . Colon polyps Neg Hx   . Esophageal cancer Neg Hx   . Stomach cancer Neg Hx   . Rectal cancer Neg Hx     Social History Social History   Tobacco Use  . Smoking status: Light Tobacco Smoker    Years: 3.00  . Smokeless tobacco: Never Used  . Tobacco comment: smokes a cig "every blue moon"  Substance Use Topics  . Alcohol use: Yes    Alcohol/week: 0.0 standard drinks    Comment: occassional  . Drug use: No     Allergies   Latex and Remicade [infliximab]   Review of Systems Review of Systems  Constitutional: Negative for fever.  HENT: Negative for rhinorrhea and sore throat.   Eyes: Negative for redness and visual disturbance.  Respiratory: Negative for cough.   Cardiovascular: Negative for chest pain.  Gastrointestinal: Positive for abdominal pain and diarrhea. Negative for nausea and vomiting.  Genitourinary: Negative for dysuria.  Musculoskeletal: Negative for myalgias.  Skin: Negative for rash.  Neurological: Positive for headaches.     Physical Exam Updated Vital Signs BP 125/80 (BP Location: Left Arm)   Pulse 95   Temp 98.5 F (36.9 C) (Oral)   Resp 16   SpO2 99%   Physical Exam Vitals signs and nursing note reviewed.  Constitutional:      Appearance: She is well-developed.  HENT:     Head: Normocephalic and atraumatic.  Eyes:     General:        Right eye: No discharge.        Left eye: No discharge.     Conjunctiva/sclera: Conjunctivae normal.  Neck:     Musculoskeletal: Normal range of motion and neck supple.  Cardiovascular:     Rate and Rhythm: Normal rate and regular rhythm.     Heart  sounds: Normal heart sounds.  Pulmonary:     Effort: Pulmonary effort is normal.     Breath sounds: Normal breath sounds.  Abdominal:     General: There is distension.     Palpations: Abdomen is soft.     Tenderness: There is no abdominal tenderness. There is no guarding.  Comments: No tenderness at time of exam.   Skin:    General: Skin is warm and dry.  Neurological:     Mental Status: She is alert.      ED Treatments / Results  Labs (all labs ordered are listed, but only abnormal results are displayed) Labs Reviewed  URINALYSIS, ROUTINE W REFLEX MICROSCOPIC - Abnormal; Notable for the following components:      Result Value   Color, Urine STRAW (*)    Specific Gravity, Urine 1.004 (*)    All other components within normal limits  COMPREHENSIVE METABOLIC PANEL - Abnormal; Notable for the following components:   CO2 21 (*)    Calcium 8.3 (*)    Anion gap 4 (*)    All other components within normal limits  I-STAT BETA HCG BLOOD, ED ([facility], [facility], [facility] ONLY) - Abnormal; Notable for the following components:   I-stat hCG, quantitative 203.5 (*)    All other components within normal limits  CBC WITH DIFFERENTIAL/PLATELET    EKG None  Radiology No results found.  Procedures Procedures (including critical care time)  Medications Ordered in ED Medications  metoCLOPramide (REGLAN) injection 10 mg (has no administration in time range)     Initial Impression / Assessment and Plan / ED Course  I have reviewed the triage vital signs and the nursing notes.  Pertinent labs & imaging results that were available during my care of the patient were reviewed by me and considered in my medical decision making (see chart for details).        Patient seen and examined.  Patient looks well.  Will check labs, give fluids, check urine.  Blood pressure 125/80.  Per dates, patient is in first trimester of pregnancy.  No vaginal complaints.  Pain is generalized and not isolated to the  lower abdomen, doubt ectopic.   Vital signs reviewed and are as follows: BP 125/80 (BP Location: Left Arm)   Pulse 95   Temp 98.5 F (36.9 C) (Oral)   Resp 16   SpO2 99%   8:56 AM patient reexamined.  She states that she is feeling better after treatment for headache.  No vomiting.  Confirmed no abdominal pain or vaginal bleeding.  On reexam, abdomen soft and nontender.  At this point, will discharge patient home.  Encouraged her to rest.  Encourage prenatal vitamins.  Encouraged good oral intake.  Reglan written in case she has nausea.  Encouraged return to the emergency department with worsening pain especially in the lower abdomen, vaginal bleeding, persistent vomiting, fever, or other concerns.  Final Clinical Impressions(s) / ED Diagnoses   Final diagnoses:  First trimester pregnancy  Abdominal cramps  Acute nonintractable headache, unspecified headache type   Patient with headache and generalized abdominal cramps and first trimester pregnancy.  Beta i-STAT hCG today was in the 200s.  Based on the dates, she should only be a few weeks along.  She denies any vaginal bleeding or discharge today.  Abdominal pain, not present at time of exam.  Abdominal exam shows a soft, nontender abdomen with no rebound or guarding.  No changes during ED stay.  Symptoms controlled with Reglan and patient is feeling better.  Comfortable discharged home at this time with appropriate follow-up.  ED Discharge Orders         Ordered    metoCLOPramide (REGLAN) 10 MG tablet  Every 6 hours PRN     07/27/19 6433  Carlisle Cater, PA-C 07/27/19 6922    Daleen Bo, MD 07/29/19 [phone]

## 2019-07-27 NOTE — Discharge Instructions (Addendum)
Please read and follow all provided instructions.  Your diagnoses today include:  1. First trimester pregnancy   2. Abdominal cramps   3. Acute nonintractable headache, unspecified headache type     Tests performed today include:  Blood counts and electrolytes  Blood tests to check liver and kidney function  Blood test for pregnancy - positive, suggests early pregnancy  Urine test to look for infection  Vital signs. See below for your results today.   Medications prescribed:   Reglan - medication for nausea  Take any prescribed medications only as directed.  Home care instructions:   Follow any educational materials contained in this packet.  Follow-up instructions: Please follow-up with your primary care provider in the next 3 days for further evaluation of your symptoms if not improving.    Return instructions:  SEEK IMMEDIATE MEDICAL ATTENTION IF:  The pain does not go away or becomes severe   A temperature above 101F develops   Repeated vomiting occurs (multiple episodes)   The pain becomes localized to portions of the abdomen. The right side could possibly be appendicitis. In an adult, the left lower portion of the abdomen could be colitis or diverticulitis.   Blood is being passed in stools or vomit (bright red or black tarry stools)   You develop chest pain, difficulty breathing, dizziness or fainting, or become confused, poorly responsive, or inconsolable (young children)  If you have any other emergent concerns regarding your health  Additional Information: Abdominal (belly) pain can be caused by many things. Your caregiver performed an examination and possibly ordered blood/urine tests and imaging (CT scan, x-rays, ultrasound). Many cases can be observed and treated at home after initial evaluation in the emergency department. Even though you are being discharged home, abdominal pain can be unpredictable. Therefore, you need a repeated exam if your pain  does not resolve, returns, or worsens. Most patients with abdominal pain don't have to be admitted to the hospital or have surgery, but serious problems like appendicitis and gallbladder attacks can start out as nonspecific pain. Many abdominal conditions cannot be diagnosed in one visit, so follow-up evaluations are very important.  Your vital signs today were: BP 125/80 (BP Location: Left Arm)    Pulse 95    Temp 98.5 F (36.9 C) (Oral)    Resp 16    SpO2 99%  If your blood pressure (bp) was elevated above 135/85 this visit, please have this repeated by your doctor within one month. --------------

## 2019-07-27 NOTE — ED Triage Notes (Signed)
Pt reports a headache and abdominal cramping since yesterday. She reports that she is pregnant, but is unsure how far along. Denies vaginal bleeding or difficulty urinating. Endorses some diarrhea.

## 2019-08-30 ENCOUNTER — Inpatient Hospital Stay (HOSPITAL_COMMUNITY)
Admission: AD | Admit: 2019-08-30 | Discharge: 2019-08-31 | Disposition: A | Discharge status: Home / Self Care | Payer: Medicaid Other | Attending: Obstetrics & Gynecology | Admitting: Obstetrics & Gynecology

## 2019-08-30 ENCOUNTER — Encounter (HOSPITAL_COMMUNITY): Payer: Self-pay

## 2019-08-30 ENCOUNTER — Other Ambulatory Visit: Payer: Self-pay

## 2019-08-30 DIAGNOSIS — Z888 Allergy status to other drugs, medicaments and biological substances status: Secondary | ICD-10-CM | POA: Diagnosis not present

## 2019-08-30 DIAGNOSIS — O26891 Other specified pregnancy related conditions, first trimester: Secondary | ICD-10-CM | POA: Diagnosis not present

## 2019-08-30 DIAGNOSIS — R110 Nausea: Secondary | ICD-10-CM | POA: Insufficient documentation

## 2019-08-30 DIAGNOSIS — O219 Vomiting of pregnancy, unspecified: Secondary | ICD-10-CM

## 2019-08-30 DIAGNOSIS — Z720 Tobacco use: Secondary | ICD-10-CM | POA: Insufficient documentation

## 2019-08-30 DIAGNOSIS — Z3A08 8 weeks gestation of pregnancy: Secondary | ICD-10-CM

## 2019-08-30 DIAGNOSIS — Z91040 Latex allergy status: Secondary | ICD-10-CM | POA: Insufficient documentation

## 2019-08-30 DIAGNOSIS — R519 Headache, unspecified: Secondary | ICD-10-CM

## 2019-08-30 LAB — URINALYSIS, ROUTINE W REFLEX MICROSCOPIC
Bilirubin Urine: NEGATIVE
Glucose, UA: NEGATIVE mg/dL
Hgb urine dipstick: NEGATIVE
Ketones, ur: NEGATIVE mg/dL
Leukocytes,Ua: NEGATIVE
Nitrite: NEGATIVE
Protein, ur: NEGATIVE mg/dL
Specific Gravity, Urine: 1.009 (ref 1.005–1.030)
pH: 8 (ref 5.0–8.0)

## 2019-08-30 MED ORDER — PROMETHAZINE HCL 12.5 MG PO TABS: 12.5000 mg | ORAL_TABLET | Freq: Four times a day (QID) | ORAL | 0 refills | Status: DC | PRN

## 2019-08-30 MED ORDER — ONDANSETRON 8 MG PO TBDP: 8.0000 mg | ORAL_TABLET | Freq: Three times a day (TID) | ORAL | 0 refills | Status: DC | PRN

## 2019-08-30 MED ORDER — DOXYLAMINE-PYRIDOXINE 10-10 MG PO TBEC: 1.0000 | DELAYED_RELEASE_TABLET | Freq: Three times a day (TID) | ORAL | 1 refills | Status: DC

## 2019-08-30 MED ORDER — BUTALBITAL-APAP-CAFFEINE 50-325-40 MG PO TABS
2.0000 | ORAL_TABLET | Freq: Once | ORAL | Status: AC
  Administered 2019-08-30: 2 via ORAL
  Filled 2019-08-30: qty 2

## 2019-08-30 NOTE — MAU Note (Signed)
Pt here for HA and nausea that started last night while she was at work. Reports she has not had vomiting. Took Tylenol PM last night-did not help. Nothing today. Rates HA 7/10

## 2019-08-30 NOTE — MAU Provider Note (Signed)
Patient Joyce Solomon is a 33 y.o. [phone] At 6w5dhere with complaints of headache and nausea. She is not vomiting. She denies VB or abdominal pain.   Her nausea is on-going but her headache started last night. She denies COVID-19 symptoms.   She works at [employer]; feels that her work is contributing to her nausea and headache.  History     CSN: [id number] Arrival date and time: 08/30/19 2131   None     Chief Complaint  Patient presents with  . Headache  . Nausea   Headache  This is a new problem. The current episode started yesterday. The problem has been unchanged. The quality of the pain is described as aching. The pain is at a severity of 8/10. Nothing aggravates the symptoms. She has tried acetaminophen for the symptoms.   She reports that she has lost her appetite due to nausea.  OB History    Gravida  6   Para  3   Term  3   Preterm      AB  2   Living  3     SAB  1   TAB  1   Ectopic      Multiple  0   Live Births  3           Past Medical History:  Diagnosis Date  . Allergy    SEASONAL  . Iron Deficiency Anemia 2008   s/p transfusions 2009, 2010; inconsistent compliance with PO iron.   . Non compliance w medication regimen 06/2015   stopped Remicade, may not have been taking Azulfidine.  .Marland KitchenUlcerative Colitis 2007   on Humira    Past Surgical History:  Procedure Laterality Date  . CESAREAN SECTION N/A 12/09/2013   Procedure: Primary Cesarean Section Delivery Baby Girl @ 1959, Apgars 8/8;  Surgeon: KLogan Bores MD;  [facility];  Service: Obstetrics;  Laterality: N/A;  . CESAREAN SECTION N/A 08/14/2017   Procedure: CESAREAN SECTION;  Surgeon: RPaula Compton MD;  [facility]  Service: Obstetrics;  Laterality: N/A;  . COLONOSCOPY  01/19/2011   Dr GPenelope Coop no active UC  . COLONOSCOPY W/ BIOPSIES N/A 04/2014   Dr KDeatra Ina Left sided colitis from prox descending to rectum; no inflammation at or  proximal to splenic flexure  . DILATION AND EVACUATION  08/21/2012   Procedure: DILATATION AND EVACUATION;  Surgeon: KLogan Bores MD;  [facility];  Service: Gynecology  . TONSILLECTOMY      Family History  Problem Relation Age of Onset  . Miscarriages / Stillbirths Other   . Hypotension Neg Hx   . Alcohol abuse Neg Hx   . Arthritis Neg Hx   . Asthma Neg Hx   . Birth defects Neg Hx   . COPD Neg Hx   . Depression Neg Hx   . Diabetes Neg Hx   . Drug abuse Neg Hx   . Hearing loss Neg Hx   . Heart disease Neg Hx   . Hyperlipidemia Neg Hx   . Hypertension Neg Hx   . Kidney disease Neg Hx   . Learning disabilities Neg Hx   . Mental illness Neg Hx   . Mental retardation Neg Hx   . Colon cancer Neg Hx   . Colon polyps Neg Hx   . Esophageal cancer Neg Hx   . Stomach cancer Neg Hx   . Rectal cancer Neg Hx     Social  History   Tobacco Use  . Smoking status: Light Tobacco Smoker    Years: 3.00  . Smokeless tobacco: Never Used  . Tobacco comment: smokes a cig "every blue moon"  Substance Use Topics  . Alcohol use: Yes    Alcohol/week: 0.0 standard drinks    Comment: occassional  . Drug use: No    Allergies:  Allergies  Allergen Reactions  . Latex Rash  . Remicade [Infliximab] Itching    Medications Prior to Admission  Medication Sig Dispense Refill Last Dose  . diphenhydramine-acetaminophen (TYLENOL PM) 25-500 MG TABS tablet Take 1 tablet by mouth at bedtime as needed.   08/29/2019 at Unknown time  . metoCLOPramide (REGLAN) 10 MG tablet Take 1 tablet (10 mg total) by mouth every 6 (six) hours as needed for nausea or vomiting. 10 tablet 0     Review of Systems  Constitutional: Negative.   HENT: Negative.   Cardiovascular: Negative.   Gastrointestinal: Negative.   Neurological: Positive for headaches.  Hematological: Negative.   Psychiatric/Behavioral: Negative.    Physical Exam   Blood pressure 113/68, pulse 84, temperature 98.3 F (36.8 C),  temperature source Oral, resp. rate 18, height 4' 11"  (1.499 m), weight 65.4 kg, last menstrual period 06/30/2019, SpO2 100 %.  Physical Exam  Constitutional: She appears well-developed.  HENT:  Head: Normocephalic.  Neck: Normal range of motion.  Respiratory: Effort normal.  GI: Soft.  Musculoskeletal: Normal range of motion.  Neurological: She is alert.  Skin: Skin is warm and dry.    MAU Course  Procedures  MDM -patient had Fioricet in MAU; now rates HA a 0/10.   -UA is clear, no signs of dehydration.  -She reports significant nausea throughout the day; loss of appetite.  Assessment and Plan   1. Acute nonintractable headache, unspecified headache type    2. Given RX for phenergan and diclegis, try Zofran if begins having a vomiting.   3. Try tylenol for headache; eat and drink in small quantities.   4. Pregnancy verification letter and light duty work note given.  5. Keep NOB appt at the end of October; return to MAU if cannot keep down liquids or develop other concerning early pregnancy warning signs.   Mervyn Skeeters Kooistra 08/30/2019, 11:37 PM

## 2019-08-31 NOTE — Discharge Instructions (Signed)
-don't take ibuprofen in 1st trimester -try tylenol; eat and drink as much as possible in small amounts.  -take phenergan at before sleep -try dissolving odansetron if you start vomiting, otherwise take the doxylamine-pyridoxine three times a day.   General Headache Without Cause A headache is pain or discomfort felt around the head or neck area. The specific cause of a headache may not be found. There are many causes and types of headaches. A few common ones are:  Tension headaches.  Migraine headaches.  Cluster headaches.  Chronic daily headaches. Follow these instructions at home: Watch your condition for any changes. Let your health care provider know about them. Take these steps to help with your condition: Managing pain      Take over-the-counter and prescription medicines only as told by your health care provider.  Lie down in a dark, quiet room when you have a headache.  If directed, put ice on your head and neck area: ? Put ice in a plastic bag. ? Place a towel between your skin and the bag. ? Leave the ice on for 20 minutes, 2-3 times per day.  If directed, apply heat to the affected area. Use the heat source that your health care provider recommends, such as a moist heat pack or a heating pad. ? Place a towel between your skin and the heat source. ? Leave the heat on for 20-30 minutes. ? Remove the heat if your skin turns bright red. This is especially important if you are unable to feel pain, heat, or cold. You may have a greater risk of getting burned.  Keep lights dim if bright lights bother you or make your headaches worse. Eating and drinking  Eat meals on a regular schedule.  If you drink alcohol: ? Limit how much you use to:  0-1 drink a day for women.  0-2 drinks a day for men. ? Be aware of how much alcohol is in your drink. In the [place] one drink equals one 12 oz bottle of beer (355 mL), one 5 oz glass of wine (148 mL), or one 1 oz glass of hard  liquor (44 mL).  Stop drinking caffeine, or decrease the amount of caffeine you drink. General instructions   Keep a headache journal to help find out what may trigger your headaches. For example, write down: ? What you eat and drink. ? How much sleep you get. ? Any change to your diet or medicines.  Try massage or other relaxation techniques.  Limit stress.  Sit up straight, and do not tense your muscles.  Do not use any products that contain nicotine or tobacco, such as cigarettes, e-cigarettes, and chewing tobacco. If you need help quitting, ask your health care provider.  Exercise regularly as told by your health care provider.  Sleep on a regular schedule. Get 7-9 hours of sleep each night, or the amount recommended by your health care provider.  Keep all follow-up visits as told by your health care provider. This is important. Contact a health care provider if:  Your symptoms are not helped by medicine.  You have a headache that is different from the usual headache.  You have nausea or you vomit.  You have a fever. Get help right away if:  Your headache becomes severe quickly.  Your headache gets worse after moderate to intense physical activity.  You have repeated vomiting.  You have a stiff neck.  You have a loss of vision.  You have problems with  speech.  You have pain in the eye or ear.  You have muscular weakness or loss of muscle control.  You lose your balance or have trouble walking.  You feel faint or pass out.  You have confusion.  You have a seizure. Summary  A headache is pain or discomfort felt around the head or neck area.  There are many causes and types of headaches. In some cases, the cause may not be found.  Keep a headache journal to help find out what may trigger your headaches. Watch your condition for any changes. Let your health care provider know about them.  Contact a health care provider if you have a headache that is  different from the usual headache, or if your symptoms are not helped by medicine.  Get help right away if your headache becomes severe, you vomit, you have a loss of vision, you lose your balance, or you have a seizure. This information is not intended to replace advice given to you by your health care provider. Make sure you discuss any questions you have with your health care provider. Document Released: 11/06/2005 Document Revised: 05/27/2018 Document Reviewed: 05/27/2018 Elsevier Patient Education  2020 [employer].

## 2019-09-02 ENCOUNTER — Telehealth: Payer: Self-pay | Admitting: Gastroenterology

## 2019-09-02 NOTE — Telephone Encounter (Signed)
Pt states she is about a couple of months pregnant. Reports she has been having abdominal cramping, some blood in her stool, and urgency. She has been taking her Humira. Pt scheduled to see Dr. Silverio Decamp 09/08/19@4 :05pm. Pt aware of appt.

## 2019-09-02 NOTE — Telephone Encounter (Signed)
Pt states that since she found out that she is pregnant and she has been experiencing UC flare ups since then, she wants to know what medication she can take.

## 2019-09-08 ENCOUNTER — Other Ambulatory Visit: Payer: Self-pay

## 2019-09-08 ENCOUNTER — Other Ambulatory Visit (INDEPENDENT_AMBULATORY_CARE_PROVIDER_SITE_OTHER): Payer: Medicaid Other

## 2019-09-08 ENCOUNTER — Encounter: Payer: Self-pay | Admitting: Gastroenterology

## 2019-09-08 ENCOUNTER — Ambulatory Visit: Payer: Medicaid Other | Admitting: Gastroenterology

## 2019-09-08 VITALS — BP 114/60 | HR 92 | Temp 98.4°F | Ht 59.0 in | Wt 144.4 lb

## 2019-09-08 DIAGNOSIS — K5909 Other constipation: Secondary | ICD-10-CM | POA: Diagnosis not present

## 2019-09-08 DIAGNOSIS — K51818 Other ulcerative colitis with other complication: Secondary | ICD-10-CM | POA: Diagnosis not present

## 2019-09-08 DIAGNOSIS — K5904 Chronic idiopathic constipation: Secondary | ICD-10-CM | POA: Diagnosis not present

## 2019-09-08 DIAGNOSIS — K602 Anal fissure, unspecified: Secondary | ICD-10-CM

## 2019-09-08 DIAGNOSIS — K51311 Ulcerative (chronic) rectosigmoiditis with rectal bleeding: Secondary | ICD-10-CM

## 2019-09-08 DIAGNOSIS — K625 Hemorrhage of anus and rectum: Secondary | ICD-10-CM | POA: Diagnosis not present

## 2019-09-08 LAB — CBC WITH DIFFERENTIAL/PLATELET
Basophils Absolute: 0 10*3/uL (ref 0.0–0.1)
Basophils Relative: 0.3 % (ref 0.0–3.0)
Eosinophils Absolute: 0.3 10*3/uL (ref 0.0–0.7)
Eosinophils Relative: 2 % (ref 0.0–5.0)
HCT: 38.2 % (ref 36.0–46.0)
Hemoglobin: 12.7 g/dL (ref 12.0–15.0)
Lymphocytes Relative: 28.2 % (ref 12.0–46.0)
Lymphs Abs: 3.7 10*3/uL (ref 0.7–4.0)
MCHC: 33.3 g/dL (ref 30.0–36.0)
MCV: 94.6 fl (ref 78.0–100.0)
Monocytes Absolute: 1.4 10*3/uL — ABNORMAL HIGH (ref 0.1–1.0)
Monocytes Relative: 10.6 % (ref 3.0–12.0)
Neutro Abs: 7.6 10*3/uL (ref 1.4–7.7)
Neutrophils Relative %: 58.9 % (ref 43.0–77.0)
Platelets: 317 10*3/uL (ref 150.0–400.0)
RBC: 4.04 Mil/uL (ref 3.87–5.11)
RDW: 13.3 % (ref 11.5–15.5)
WBC: 13 10*3/uL — ABNORMAL HIGH (ref 4.0–10.5)

## 2019-09-08 LAB — IBC + FERRITIN
Ferritin: 17.8 ng/mL (ref 10.0–291.0)
Iron: 81 ug/dL (ref 42–145)
Saturation Ratios: 23.3 % (ref 20.0–50.0)
Transferrin: 248 mg/dL (ref 212.0–360.0)

## 2019-09-08 LAB — FOLATE: Folate: 16.9 ng/mL (ref >5.9)

## 2019-09-08 LAB — COMPREHENSIVE METABOLIC PANEL
ALT: 8 U/L (ref 0–35)
AST: 9 U/L (ref 0–37)
Albumin: 3.9 g/dL (ref 3.5–5.2)
Alkaline Phosphatase: 48 U/L (ref 39–117)
BUN: 8 mg/dL (ref 6–23)
CO2: 21 mEq/L (ref 19–32)
Calcium: 8.7 mg/dL (ref 8.4–10.5)
Chloride: 105 mEq/L (ref 96–112)
Creatinine, Ser: 0.43 mg/dL (ref 0.40–1.20)
GFR: 203.62 mL/min (ref >60.00)
Glucose, Bld: 92 mg/dL (ref 70–99)
Potassium: 3.9 mEq/L (ref 3.5–5.1)
Sodium: 134 mEq/L — ABNORMAL LOW (ref 135–145)
Total Bilirubin: 0.2 mg/dL (ref 0.2–1.2)
Total Protein: 6.9 g/dL (ref 6.0–8.3)

## 2019-09-08 LAB — HIGH SENSITIVITY CRP: CRP, High Sensitivity: 2.62 mg/L (ref 0.000–5.000)

## 2019-09-08 LAB — VITAMIN B12: Vitamin B-12: 431 pg/mL (ref 211–911)

## 2019-09-08 LAB — SEDIMENTATION RATE: Sed Rate: 43 mm/hr — ABNORMAL HIGH (ref 0–20)

## 2019-09-08 NOTE — Patient Instructions (Addendum)
Anal Fissure, Adult  An anal fissure is a small tear or crack in the tissue around the opening of the butt (anus). Bleeding from the tear or crack usually stops on its own within a few minutes. The bleeding may happen every time you poop (have a bowel movement) until the tear or crack heals. What are the causes? This condition is usually caused by passing a large or hard poop (stool). Other causes include:  Trouble pooping (constipation).  Passing watery poop (diarrhea).  Inflammatory bowel disease (Crohn's disease or ulcerative colitis).  Childbirth.  Infections.  Anal sex. What are the signs or symptoms? Symptoms of this condition include:  Bleeding from the butt.  Small amounts of blood on your poop. The blood coats the outside of the poop. It is not mixed with the poop.  Small amounts of blood on the toilet paper or in the toilet after you poop.  Pain when passing poop.  Itching or irritation around the opening of the butt. How is this diagnosed? This condition may be diagnosed based on a physical exam. Your doctor may:  Check your butt. A tear can often be seen by checking the area with care.  Check your butt using a short tube (anoscope). The light in the tube will show any problems in your butt. How is this treated? Treatment for this condition may include:  Treating problems that make it hard for you to pass poop. You may be told to: ? Eat more fiber. ? Drink more fluid. ? Take fiber supplements. ? Take medicines that make poop soft.  Taking sitz baths. This may help to heal the tear.  Using creams and ointments. If your condition gets worse, other treatments may be needed such as:  A shot near the tear or crack (botulinum injection).  Surgery to repair the tear or crack. Follow these instructions at home: Eating and drinking   Avoid bananas and dairy products. These foods can make it hard to poop.  Drink enough fluid to keep your pee (urine) pale  yellow.  Eat foods that have a lot of fiber in them, such as: ? Beans. ? Whole grains. ? Fresh fruits. ? Fresh vegetables. General instructions   Take over-the-counter and prescription medicines only as told by your doctor.  Use creams or ointments only as told by your doctor.  Keep the butt area as clean and dry as you can.  Take a warm water bath (sitz bath) as told by your doctor. Do not use soap.  Keep all follow-up visits as told by your doctor. This is important. Contact a doctor if:  You have more bleeding.  You have a fever.  You have watery poop that is mixed with blood.  You have pain.  Your problem gets worse, not better. Summary  An anal fissure is a small tear or crack in the skin around the opening of the butt (anus).  This condition is usually caused by passing a large or hard poop (stool).  Treatment includes treating the problems that make it hard for you to pass poop.  Follow your doctor's instructions about caring for your condition at home.  Keep all follow-up visits as told by your doctor. This is important. This information is not intended to replace advice given to you by your health care provider. Make sure you discuss any questions you have with your health care provider. Document Released: 07/05/2011 Document Revised: 04/18/2018 Document Reviewed: 04/18/2018 Elsevier Patient Education  2020 [employer].  Go to the basement for labs today  Take Benefiber 1 tablespoon three times a day  Take Mirlax daily 1 capful  Use OTC Recticare as needed   Follow up in 2 months   I appreciate the  opportunity to care for you  Thank You   Harl Bowie , MD

## 2019-09-08 NOTE — Progress Notes (Signed)
Joyce Solomon    [id number]    Mar 14, 1986  Primary Care Physician:Pa, [facility]  Referring Physician: Pa, [facility] [address] [place],   [address]   Chief complaint: Rectal bleeding, UC flare  HPI: 33 year old female with history of ulcerative colitis here for follow-up visit.  She was last seen in March 2020. She is currently [redacted] weeks pregnant, self discontinued Humira in the past few months. Unclear when the last dose of Humira was, patient thinks it was about 8 to 10 weeks ago.  She has been having intermittent bright red blood per rectum with lower abdominal discomfort.  She is also having worsening constipation, is passing pebbles with difficulty evacuating.  Denies any dysphagia, odynophagia, decreased appetite or weight loss.  Relevant GI history: Colonoscopy June 2015 showed moderately severe left-sided colitis  Allergic reaction to Remicade in 2016. Elevated antibody level to infliximab 1622 and undetectable drug level  Flexible sigmoidoscopy January 2017 showed severe colitis left colon with ulceration, biopsies showed moderately active colitis.  Started on Humira March 2017, and clinical remission since then  Colonoscopy Apr 10, 2018 negative for active colitis, chronic mucosal changes with altered vascularity and mild erythema. Random biopsies negative for active inflammation.  Drug trough 5.9 with undetectable antibody for Humira   Outpatient Encounter Medications as of 09/08/2019  Medication Sig  . ondansetron (ZOFRAN ODT) 8 MG disintegrating tablet Take 1 tablet (8 mg total) by mouth every 8 (eight) hours as needed for nausea or vomiting.  . promethazine (PHENERGAN) 12.5 MG tablet Take 1 tablet (12.5 mg total) by mouth every 6 (six) hours as needed for nausea or vomiting. Take before sleeping.  . [DISCONTINUED] Doxylamine-Pyridoxine 10-10 MG TBEC Take 1 tablet by mouth 3 (three) times daily.  . [DISCONTINUED] HUMIRA  PEN 40 MG/0.4ML PNKT INJECT 40 MG INTO THE SKIN EVERY 14 (FOURTEEN) DAYS. (Patient not taking: No sig reported)   No facility-administered encounter medications on file as of 09/08/2019.     Allergies as of 09/08/2019 - Review Complete 08/30/2019  Allergen Reaction Noted  . Latex Rash 05/20/2009  . Remicade [infliximab] Itching 08/07/2015    Past Medical History:  Diagnosis Date  . Allergy    SEASONAL  . Iron Deficiency Anemia 2008   s/p transfusions 2009, 2010; inconsistent compliance with PO iron.   . Non compliance w medication regimen 06/2015   stopped Remicade, may not have been taking Azulfidine.  [name] Ulcerative Colitis 2007   on Humira    Past Surgical History:  Procedure Laterality Date  . CESAREAN SECTION N/A 12/09/2013   Procedure: Primary Cesarean Section Delivery Baby Girl @ 1959, Apgars 8/8;  Surgeon: Logan Bores, MD;  Location: [facility];  Service: Obstetrics;  Laterality: N/A;  . CESAREAN SECTION N/A 08/14/2017   Procedure: CESAREAN SECTION;  Surgeon: Paula Compton, MD;  Location: [facility];  Service: Obstetrics;  Laterality: N/A;  . COLONOSCOPY  01/19/2011   Dr Penelope Coop. no active UC  . COLONOSCOPY W/ BIOPSIES N/A 04/2014   Dr Deatra Ina: Left sided colitis from prox descending to rectum; no inflammation at or proximal to splenic flexure  . DILATION AND EVACUATION  08/21/2012   Procedure: DILATATION AND EVACUATION;  Surgeon: Logan Bores, MD;  Location: [facility];  Service: Gynecology  . TONSILLECTOMY      Family History  Problem Relation Age of Onset  . Miscarriages / Stillbirths Other   . Hypotension Neg Hx   .  Alcohol abuse Neg Hx   . Arthritis Neg Hx   . Asthma Neg Hx   . Birth defects Neg Hx   . COPD Neg Hx   . Depression Neg Hx   . Diabetes Neg Hx   . Drug abuse Neg Hx   . Hearing loss Neg Hx   . Heart disease Neg Hx   . Hyperlipidemia Neg Hx   . Hypertension Neg Hx   . Kidney disease Neg Hx   . Learning disabilities Neg Hx   .  Mental illness Neg Hx   . Mental retardation Neg Hx   . Colon cancer Neg Hx   . Colon polyps Neg Hx   . Esophageal cancer Neg Hx   . Stomach cancer Neg Hx   . Rectal cancer Neg Hx     Social History   Socioeconomic History  . Marital status: Single    Spouse name: Not on file  . Number of children: 2  . Years of education: Not on file  . Highest education level: Not on file  Occupational History  . Occupation: unemployed  Social Needs  . Financial resource strain: Not on file  . Food insecurity    Worry: Not on file    Inability: Not on file  . Transportation needs    Medical: Not on file    Non-medical: Not on file  Tobacco Use  . Smoking status: Light Tobacco Smoker    Years: 3.00  . Smokeless tobacco: Never Used  . Tobacco comment: smokes a cig "every blue moon"  Substance and Sexual Activity  . Alcohol use: Yes    Alcohol/week: 0.0 standard drinks    Comment: occassional  . Drug use: No  . Sexual activity: Yes    Birth control/protection: None, Condom  Lifestyle  . Physical activity    Days per week: Not on file    Minutes per session: Not on file  . Stress: Not on file  Relationships  . Social Herbalist on phone: Not on file    Gets together: Not on file    Attends religious service: Not on file    Active member of club or organization: Not on file    Attends meetings of clubs or organizations: Not on file    Relationship status: Not on file  . Intimate partner violence    Fear of current or ex partner: Not on file    Emotionally abused: Not on file    Physically abused: Not on file    Forced sexual activity: Not on file  Other Topics Concern  . Not on file  Social History Narrative  . Not on file      Review of systems: Review of Systems  Constitutional: Negative for fever and chills.  HENT: Negative.   Eyes: Negative for blurred vision.  Respiratory: Negative for cough, shortness of breath and wheezing.   Cardiovascular:  Negative for chest pain and palpitations.  Gastrointestinal: as per HPI Genitourinary: Negative for dysuria, urgency, frequency and hematuria.  Musculoskeletal: Negative for myalgias, back pain and joint pain.  Skin: Negative for itching and rash.  Neurological: Negative for dizziness, tremors, focal weakness, seizures and loss of consciousness.  Endo/Heme/Allergies: Positive for seasonal allergies.  Psychiatric/Behavioral: Negative for depression, suicidal ideas and hallucinations.  All other systems reviewed and are negative.   Physical Exam: Vitals:   09/08/19 1519  BP: 114/60  Pulse: 92  Temp: 98.4 F (36.9 C)   Body mass  index is 29.16 kg/m. Gen:      No acute distress HEENT:  EOMI, sclera anicteric Neck:     No masses; no thyromegaly Lungs:    Clear to auscultation bilaterally; normal respiratory effort CV:         Regular rate and rhythm; no murmurs Abd:      + bowel sounds; soft, non-tender; no palpable masses, no distension Ext:    No edema; adequate peripheral perfusion Skin:      Warm and dry; no rash Neuro: alert and oriented x 3 Psych: normal mood and affect Rectal exam: increased anal sphincter tone, + anal fissure   Data Reviewed:  Reviewed labs, radiology imaging, old records and pertinent past GI work up   Assessment and Plan/Recommendations:  33 year old female with history of left-sided ulcerative colitis, was in clinical remission on Humira but self discontinued since she knew about her pregnancy, is currently in her first trimester  Anal fissure on exam Given she is pregnant, will avoid rectal nitroglycerin or diltiazem Benefiber 1 tablespoon 3 times daily with meals  Start MiraLAX 1 capful daily to improve constipation Increase dietary fiber and fluid intake  Check CBC, CMP, CRP, ESR, B12, folate and iron panel If evidence of active inflammation, will consider restarting Humira or Cimzia to prevent exacerbation of ulcerative colitis Prior to  initiating Biologics, will request clearance from her OB to reassure patient  25 minutes was spent face-to-face with the patient. Greater than 50% of the time used for counseling as well as treatment plan and follow-up. She had multiple questions which were answered to her satisfaction  K. Denzil Magnuson , MD    CC: Pa, [facility]

## 2019-09-09 ENCOUNTER — Ambulatory Visit (INDEPENDENT_AMBULATORY_CARE_PROVIDER_SITE_OTHER): Payer: Medicaid Other

## 2019-09-09 ENCOUNTER — Other Ambulatory Visit: Payer: Self-pay

## 2019-09-09 ENCOUNTER — Encounter: Payer: Self-pay | Admitting: Gastroenterology

## 2019-09-09 DIAGNOSIS — O0990 Supervision of high risk pregnancy, unspecified, unspecified trimester: Secondary | ICD-10-CM | POA: Insufficient documentation

## 2019-09-09 MED ORDER — BLOOD PRESSURE KIT DEVI: 1.0000 | 0 refills | Status: DC | PRN

## 2019-09-09 NOTE — Progress Notes (Signed)
Attestation of Attending Supervision of Advanced Practitioner (CNM/NP): Evaluation and management procedures were performed by the Advanced Practitioner under my supervision and collaboration.  I have reviewed the Advanced Practitioner's note and chart, and I agree with the management and plan.  Joyce Solomon 09/09/2019 3:48 PM

## 2019-09-09 NOTE — Progress Notes (Signed)
  Virtual Visit via Telephone Note  I connected with Joyce Solomon on 09/09/19 at  1:15 PM EDT by telephone and verified that I am speaking with the correct person using two identifiers.  Location: Patient: Joyce Solomon Provider: Hollice Gong, CMA   I discussed the limitations, risks, security and privacy concerns of performing an evaluation and management service by telephone and the availability of in person appointments. I also discussed with the patient that there may be a patient responsible charge related to this service. The patient expressed understanding and agreed to proceed.   History of Present Illness: PRENATAL INTAKE SUMMARY  Joyce Solomon presents today New OB Nurse Interview.  OB History    Gravida  6   Para  3   Term  3   Preterm      AB  2   Living  3     SAB  1   TAB  1   Ectopic      Multiple  0   Live Births  3          I have reviewed the patient's medical, obstetrical, social, and family histories, medications, and available lab results.  SUBJECTIVE She has no unusual complaints.    Observations/Objective: Initial nurse interview for history/labs (New OB)  EDD: 04/05/2020 GA: 29w1dG6/P3 FHT: non face to face interview  GENERAL APPEARANCE: alert oriented to person, non face to face interview  Assessment and Plan: High Risk Pregnancy  [facility] BP cuff explained and ordered today Download Babyscripts, explained in detail Sign up for Mychart  Follow Up Instructions:   I discussed the assessment and treatment plan with the patient. The patient was provided an opportunity to ask questions and all were answered. The patient agreed with the plan and demonstrated an understanding of the instructions.   The patient was advised to call back or seek an in-person evaluation if the symptoms worsen or if the condition fails to improve as anticipated.  I provided  25 minutes of non-face-to-face time during this encounter.   TMaryruth Eve CMA

## 2019-09-09 NOTE — Assessment & Plan Note (Signed)
Taking Humira pen 40 mg q 14 days

## 2019-09-10 LAB — QUANTIFERON-TB GOLD PLUS
Mitogen-NIL: >10 IU/mL
NIL: 0.04 IU/mL
QuantiFERON-TB Gold Plus: NEGATIVE
TB1-NIL: 0 IU/mL
TB2-NIL: <0 IU/mL

## 2019-09-17 ENCOUNTER — Other Ambulatory Visit: Payer: Self-pay

## 2019-09-17 ENCOUNTER — Encounter: Payer: Self-pay | Admitting: Family Medicine

## 2019-09-17 ENCOUNTER — Other Ambulatory Visit (HOSPITAL_COMMUNITY)
Admission: RE | Admit: 2019-09-17 | Discharge: 2019-09-17 | Disposition: A | Discharge status: Home / Self Care | Payer: Medicaid Other | Source: Ambulatory Visit | Attending: Family Medicine | Admitting: Family Medicine

## 2019-09-17 ENCOUNTER — Ambulatory Visit (INDEPENDENT_AMBULATORY_CARE_PROVIDER_SITE_OTHER): Payer: Medicaid Other | Admitting: Family Medicine

## 2019-09-17 VITALS — BP 111/72 | HR 103 | Wt 143.0 lb

## 2019-09-17 DIAGNOSIS — Z3481 Encounter for supervision of other normal pregnancy, first trimester: Secondary | ICD-10-CM

## 2019-09-17 DIAGNOSIS — O0991 Supervision of high risk pregnancy, unspecified, first trimester: Secondary | ICD-10-CM

## 2019-09-17 DIAGNOSIS — E0590 Thyrotoxicosis, unspecified without thyrotoxic crisis or storm: Secondary | ICD-10-CM

## 2019-09-17 DIAGNOSIS — O0990 Supervision of high risk pregnancy, unspecified, unspecified trimester: Secondary | ICD-10-CM | POA: Diagnosis present

## 2019-09-17 DIAGNOSIS — K51311 Ulcerative (chronic) rectosigmoiditis with rectal bleeding: Secondary | ICD-10-CM

## 2019-09-17 DIAGNOSIS — Z98891 History of uterine scar from previous surgery: Secondary | ICD-10-CM

## 2019-09-17 NOTE — Patient Instructions (Signed)

## 2019-09-17 NOTE — Progress Notes (Signed)
Subjective:   Joyce Solomon is a 33 y.o. [id number] at 82w2dby LMP being seen today for her first obstetrical visit.  Her obstetrical history is significant for smoker and Ulcerative colitis, h/o hyperthyroid, previous C-section x 2. Patient does intend to breast feed. Pregnancy history fully reviewed.  Patient reports nausea and vomiting.  HISTORY: OB History  Gravida Para Term Preterm AB Living  _0 0 2 3  SAB TAB Ectopic Multiple Live Births  1 1 0 0 3    # Outcome Date GA Lbr Len/2nd Weight Sex Delivery Anes PTL Lv  6 Current           5 Term 08/14/17 371w6d8 lb 2.9 oz (3.71 kg) M CS-LVertical EPI  LIV     Name: Peery,BOY Laruen     Apgar1: 8  Apgar5: 9  4 Term 12/09/13 3828w6d lb 14.5 oz (2.68 kg) F CS-LTranv EPI  LIV     Name: GALMARGOT, ORIORDANFFANY     Apgar1: 8  Apgar5: 8  3 Term 10/21/11 38w34w0d42 / 00:33 5 lb 7.8 oz (2.489 kg) F Vag-Spont EPI  LIV     Birth Comments: no dysmorphic features; apnea secondary to tight nuchal cord and acute vascular depletion; IUGR not evident clinically; see consult note.     Name: GALLMAAT, KAFER Apgar1: 6  Apgar5: 8  2 TAB           1 SAB            Last pap smear was  2009 and was normal Past Medical History:  Diagnosis Date  . Allergy    SEASONAL  . Iron Deficiency Anemia 2008   s/p transfusions 2009, 2010; inconsistent compliance with PO iron.   . Non compliance w medication regimen 06/2015   stopped Remicade, may not have been taking Azulfidine.  . UlMarland Kitchenerative Colitis 2007   on Humira   Past Surgical History:  Procedure Laterality Date  . CESAREAN SECTION N/A 12/09/2013   Procedure: Primary Cesarean Section Delivery Baby Girl @ 1959, Apgars 8/8;  Surgeon: KathLogan Bores;  [facility];  Service: Obstetrics;  Laterality: N/A;  . CESAREAN SECTION N/A 08/14/2017   Procedure: CESAREAN SECTION;  Surgeon: RichPaula Compton;  [facility];  Laterality:  N/A;  . COLONOSCOPY  01/19/2011   Dr GanePenelope Coop active UC  . COLONOSCOPY W/ BIOPSIES N/A 04/2014   Dr KaplDeatra Inaft sided colitis from prox descending to rectum; no inflammation at or proximal to splenic flexure  . DILATION AND EVACUATION  08/21/2012   Procedure: DILATATION AND EVACUATION;  Surgeon: KathLogan Bores;  [facility];  Service: Gynecology  . TONSILLECTOMY     Family History  Problem Relation Age of Onset  . Miscarriages / Stillbirths Other   . Diabetes Sister   . Hypotension Neg Hx   . Alcohol abuse Neg Hx   . Arthritis Neg Hx   . Asthma Neg Hx   . Birth defects Neg Hx   . COPD Neg Hx   . Depression Neg Hx   . Drug abuse Neg Hx   . Hearing loss Neg Hx   . Heart disease Neg Hx   . Hyperlipidemia Neg Hx   . Hypertension Neg Hx   . Kidney disease Neg Hx   . Learning disabilities Neg Hx   . Mental illness Neg Hx   .  Mental retardation Neg Hx   . Colon cancer Neg Hx   . Colon polyps Neg Hx   . Esophageal cancer Neg Hx   . Stomach cancer Neg Hx   . Rectal cancer Neg Hx    Social History   Tobacco Use  . Smoking status: Current Some Day Smoker    Years: 3.00  . Smokeless tobacco: Never Used  . Tobacco comment: smokes a cig "every blue moon"  Substance Use Topics  . Alcohol use: Not Currently    Alcohol/week: 0.0 standard drinks    Comment: occassional  . Drug use: No   Allergies  Allergen Reactions  . Latex Rash  . Remicade [Infliximab] Itching   Current Outpatient Medications on File Prior to Visit  Medication Sig Dispense Refill  . Prenatal Vit w/Fe-Methylfol-FA (PNV PO) Take by mouth.    . [DISCONTINUED] HUMIRA PEN 40 MG/0.4ML PNKT INJECT 40 MG INTO THE SKIN EVERY 14 (FOURTEEN) DAYS. 2 each 6  . Blood Pressure Monitoring (BLOOD PRESSURE KIT) DEVI 1 Device by Does not apply route as needed. 1 Device 0  . ondansetron (ZOFRAN ODT) 8 MG disintegrating tablet Take 1 tablet (8 mg total) by mouth every 8 (eight) hours as needed for nausea or  vomiting. (Patient not taking: Reported on 09/09/2019) 60 tablet 0  . promethazine (PHENERGAN) 12.5 MG tablet Take 1 tablet (12.5 mg total) by mouth every 6 (six) hours as needed for nausea or vomiting. Take before sleeping. (Patient not taking: Reported on 09/09/2019) 30 tablet 0   No current facility-administered medications on file prior to visit.      Exam   Vitals:   09/17/19 1434  BP: 111/72  Pulse: (!) 103  Weight: 143 lb (64.9 kg)   Fetal Heart Rate (bpm): + ON US  Uterus:  Fundal Height: 10 cm  Pelvic Exam: Perineum: no hemorrhoids, normal perineum   Vulva: normal external genitalia, no lesions   Vagina:  normal mucosa, normal discharge   Cervix: no lesions and normal, pap smear done.    Adnexa: normal adnexa and no mass, fullness, tenderness   Bony Pelvis: average  System: General: well-developed, well-nourished female in no acute distress   Breast:  normal appearance, no masses or tenderness   Skin: normal coloration and turgor, no rashes   Neurologic: oriented, normal, negative, normal mood   Extremities: normal strength, tone, and muscle mass, ROM of all joints is normal   HEENT PERRLA, extraocular movement intact and sclera clear, anicteric   Mouth/Teeth mucous membranes moist, pharynx normal without lesions and dental hygiene good   Neck supple and no masses   Cardiovascular: regular rate and rhythm   Respiratory:  no respiratory distress, normal breath sounds   Abdomen: soft, non-tender; bowel sounds normal; no masses,  no organomegaly     Assessment:   Pregnancy: [id number] Patient Active Problem List   Diagnosis Date Noted  . Supervision of high risk pregnancy, antepartum 09/09/2019  . Anal fissure 09/11/2018  . S/P repeat low transverse C-section 08/15/2017  . Rectal bleeding 04/23/2014  . Status post C-section 12/10/2013  . IUGR (intrauterine growth restriction) 12/09/2013  . DENTAL CARIES 12/22/2009  . Thyrotoxicosis 08/03/2008  . Constipation  07/29/2008  . ACNE, MILD 07/29/2008  . Anemia 06/30/2008  . Ulcerative colitis (HCC) 11/20/2005     Plan:  1. S/P repeat low transverse C-section X 2, failed VBAC x 1 baby was OP, desires RCS and BTL  2. Supervision of high risk pregnancy, antepartum New   OB labs Genetics - Obstetric Panel, Including HIV - Genetic Screening - Culture, OB Urine - Babyscripts Schedule Optimization - Hemoglobin A1c - Cytology - PAP( [facility])  3. Thyrotoxicosis without thyroid storm, unspecified thyrotoxicosis type Check TSH--if normal remove from problem list - TSH  4. Ulcerative rectosigmoiditis with rectal bleeding (HCC) On Humira, D/C'd--will communicate with GI, ok for biologics in 1st and 2nd trimester, generally though should probably enrolled in studies, have discussed with pharmacy and put a note into MFM to get formal recs--would consider resuming given on-going symptoms of rectal bleeding, diarrhea, pain.   Initial labs drawn. Continue prenatal vitamins. Genetic Screening discussed, NIPS: ordered. Ultrasound discussed; fetal anatomic survey: requested.  Routine obstetric precautions reviewed. Return in about 4 weeks (around 10/15/2019) for virtual.     

## 2019-09-18 LAB — HEMOGLOBIN A1C
Est. average glucose Bld gHb Est-mCnc: 105 mg/dL
Hgb A1c MFr Bld: 5.3 % (ref 4.8–5.6)

## 2019-09-18 LAB — OBSTETRIC PANEL, INCLUDING HIV
Antibody Screen: NEGATIVE
Basophils Absolute: 0 10*3/uL (ref 0.0–0.2)
Basos: 0 %
EOS (ABSOLUTE): 0.2 10*3/uL (ref 0.0–0.4)
Eos: 3 %
HIV Screen 4th Generation wRfx: NONREACTIVE
Hematocrit: 37.9 % (ref 34.0–46.6)
Hemoglobin: 13 g/dL (ref 11.1–15.9)
Hepatitis B Surface Ag: NEGATIVE
Immature Grans (Abs): 0 10*3/uL (ref 0.0–0.1)
Immature Granulocytes: 0 %
Lymphocytes Absolute: 2.3 10*3/uL (ref 0.7–3.1)
Lymphs: 24 %
MCH: 31.3 pg (ref 26.6–33.0)
MCHC: 34.3 g/dL (ref 31.5–35.7)
MCV: 91 fL (ref 79–97)
Monocytes Absolute: 0.8 10*3/uL (ref 0.1–0.9)
Monocytes: 9 %
Neutrophils Absolute: 6 10*3/uL (ref 1.4–7.0)
Neutrophils: 64 %
Platelets: 329 10*3/uL (ref 150–450)
RBC: 4.15 x10E6/uL (ref 3.77–5.28)
RDW: 12.8 % (ref 11.7–15.4)
RPR Ser Ql: NONREACTIVE
Rh Factor: POSITIVE
Rubella Antibodies, IGG: 1.54 index (ref >0.99)
WBC: 9.4 10*3/uL (ref 3.4–10.8)

## 2019-09-18 LAB — TSH: TSH: <0.005 u[IU]/mL — ABNORMAL LOW (ref 0.450–4.500)

## 2019-09-21 LAB — URINE CULTURE, OB REFLEX

## 2019-09-21 LAB — CULTURE, OB URINE

## 2019-09-22 ENCOUNTER — Telehealth: Payer: Self-pay

## 2019-09-22 ENCOUNTER — Other Ambulatory Visit: Payer: Self-pay

## 2019-09-22 DIAGNOSIS — O0990 Supervision of high risk pregnancy, unspecified, unspecified trimester: Secondary | ICD-10-CM

## 2019-09-22 DIAGNOSIS — E0590 Thyrotoxicosis, unspecified without thyrotoxic crisis or storm: Secondary | ICD-10-CM

## 2019-09-22 NOTE — Progress Notes (Signed)
Free T3 & T4 ordered per Dr. Kennon Rounds.

## 2019-09-22 NOTE — Telephone Encounter (Signed)
Patient notified we need additional labs.  Lab Only appt made for 09/24/19 @1pm .   Free T3 & T4

## 2019-09-24 ENCOUNTER — Other Ambulatory Visit: Payer: Medicaid Other

## 2019-09-24 ENCOUNTER — Other Ambulatory Visit: Payer: Self-pay

## 2019-09-24 DIAGNOSIS — E0590 Thyrotoxicosis, unspecified without thyrotoxic crisis or storm: Secondary | ICD-10-CM

## 2019-09-24 LAB — CYTOLOGY - PAP
Chlamydia: NEGATIVE
Comment: NEGATIVE
Comment: NEGATIVE
Comment: NORMAL
Diagnosis: NEGATIVE
High risk HPV: NEGATIVE
Neisseria Gonorrhea: NEGATIVE

## 2019-09-25 LAB — T4, FREE: Free T4: 1.5 ng/dL (ref 0.82–1.77)

## 2019-09-25 LAB — T3, FREE: T3, Free: 4 pg/mL (ref 2.0–4.4)

## 2019-09-26 ENCOUNTER — Encounter: Payer: Self-pay | Admitting: Family Medicine

## 2019-09-29 ENCOUNTER — Encounter: Payer: Self-pay | Admitting: Family Medicine

## 2019-09-30 ENCOUNTER — Encounter: Payer: Self-pay | Admitting: Family Medicine

## 2019-10-03 ENCOUNTER — Telehealth: Payer: Self-pay

## 2019-10-03 NOTE — Telephone Encounter (Signed)
Return call to pt regarding her message left on triage vm Pt not ava left detailed message for pt to return call to office.

## 2019-10-15 ENCOUNTER — Telehealth (INDEPENDENT_AMBULATORY_CARE_PROVIDER_SITE_OTHER): Payer: Medicaid Other | Admitting: Nurse Practitioner

## 2019-10-15 ENCOUNTER — Encounter: Payer: Self-pay | Admitting: Nurse Practitioner

## 2019-10-15 DIAGNOSIS — O0992 Supervision of high risk pregnancy, unspecified, second trimester: Secondary | ICD-10-CM

## 2019-10-15 DIAGNOSIS — O0990 Supervision of high risk pregnancy, unspecified, unspecified trimester: Secondary | ICD-10-CM

## 2019-10-15 DIAGNOSIS — K51311 Ulcerative (chronic) rectosigmoiditis with rectal bleeding: Secondary | ICD-10-CM

## 2019-10-15 DIAGNOSIS — Z3A15 15 weeks gestation of pregnancy: Secondary | ICD-10-CM

## 2019-10-15 NOTE — Progress Notes (Signed)
I connected with patient @ on 10/15/19 at  2:15 PM EST by: MyChart and verified that I am speaking with the correct person using two identifiers.  Patient is located at home setting but not her home and provider is located at [place] office.     The purpose of this virtual visit is to provide medical care while limiting exposure to the novel coronavirus. I discussed the limitations, risks, security and privacy concerns of performing an evaluation and management service by Earlie Server, NP and the availability of in person appointments. I also discussed with the patient that there may be a patient responsible charge related to this service. By engaging in this virtual visit, you consent to the provision of healthcare.  Additionally, you authorize for your insurance to be billed for the services provided during this visit.  The patient expressed understanding and agreed to proceed.  The following staff members participated in the virtual visit:  Izell Blyn, CMA and Earlie Server, NP    PRENATAL VISIT NOTE  Subjective:  Joyce Solomon is a 33 y.o. [id number] at [redacted]w[redacted]d for phone visit for ongoing prenatal care.  She is currently monitored for the following issues for this high-risk pregnancy and has Thyrotoxicosis; Anemia; DENTAL CARIES; Ulcerative colitis ([place]; Constipation; ACNE, MILD; Prior pregnancy complicated by SGA (small for gestational age), antepartum; History of cesarean delivery affecting pregnancy; Rectal bleeding; Anal fissure; and Supervision of high risk pregnancy, antepartum on their problem list.  Patient reports vomiting that was worse last week but seems to have gotten much better this week..  Contractions: Not present. Vag. Bleeding: None.  Movement: Absent. Denies leaking of fluid.   The following portions of the patient's history were reviewed and updated as appropriate: allergies, current medications, past family history, past medical history, past social history, past  surgical history and problem list.   Objective:  There were no vitals filed for this visit. Self-Obtained - Is not at the same location as her BP cuff and cannot take BP today.  In fact has not gotten BP cuff to work at all.  Fetal Status:     Movement: Absent     Assessment and Plan:  Pregnancy: [id number] 118w2d. Supervision of high risk pregnancy, antepartum Reviewed New OB note.  Asked client if she has contacted her MD about continuing HuLouviersor second trimester.  Advised she will need to stop it again at 28 weeks but to check with her doctor as the benefits to her might be helpful until 28 weeks. Panorama was not able to be processed and needs to be drawn again.  Will arrange lab appointment next week for Panorama.  And client can bring BP cuff to be checked.  2. Ulcerative rectosigmoiditis with rectal bleeding (HCS[place]emSee above  Preterm labor symptoms and general obstetric precautions including but not limited to vaginal bleeding, contractions, leaking of fluid and fetal movement were reviewed in detail with the patient.  Return in about 1 week (around 10/22/2019) for Needs lab visit next week for Panorama and will bring BP cuff AND virtual HOB in 4 weeks.  Future Appointments  Date Time Provider De[place]12/12/2018  2:15 PM CW[place]ark[facility] PointAB [facility]  11/12/2019  2:00 PM HaShelly BombardMD CW[facility] Vistaone     Time spent on virtual visit: 8 minutes  TEEarlie ServerRN, MSN, NP-BC Nurse Practitioner, FaP[facility] Clearfieldor WoDean Foods CompanyCoKnoxvilleroup 10/15/2019 2:45 PM

## 2019-10-22 ENCOUNTER — Other Ambulatory Visit: Payer: Medicaid Other

## 2019-10-22 ENCOUNTER — Other Ambulatory Visit: Payer: Self-pay

## 2019-10-22 DIAGNOSIS — O0990 Supervision of high risk pregnancy, unspecified, unspecified trimester: Secondary | ICD-10-CM

## 2019-10-29 ENCOUNTER — Encounter: Payer: Self-pay | Admitting: Obstetrics and Gynecology

## 2019-11-12 ENCOUNTER — Telehealth: Payer: Medicaid Other | Admitting: Obstetrics

## 2019-11-12 ENCOUNTER — Ambulatory Visit (HOSPITAL_COMMUNITY): Admission: RE | Admit: 2019-11-12 | Payer: Medicaid Other | Source: Ambulatory Visit

## 2019-11-12 ENCOUNTER — Ambulatory Visit (HOSPITAL_COMMUNITY): Payer: Medicaid Other

## 2019-11-12 ENCOUNTER — Telehealth: Payer: Self-pay | Admitting: *Deleted

## 2019-11-12 NOTE — Telephone Encounter (Signed)
Error /not called

## 2019-11-19 NOTE — Progress Notes (Deleted)
I connected with [name] on 11/19/19 at  8:15 AM EST by: ***MyChart and verified that I am speaking with the correct person using two identifiers.  Patient is located at ***home and provider is located at [facility].     The purpose of this virtual visit is to provide medical care while limiting exposure to the novel coronavirus. I discussed the limitations, risks, security and privacy concerns of performing an evaluation and management service by ***MyChart and the availability of in person appointments. I also discussed with the patient that there may be a patient responsible charge related to this service. By engaging in this virtual visit, you consent to the provision of healthcare.  Additionally, you authorize for your insurance to be billed for the services provided during this visit.  The patient expressed understanding and agreed to proceed.  The following staff members participated in the virtual visit:  Vernice Jefferson, NP    PRENATAL VISIT NOTE  Subjective:  PRESTON WEILL is a 33 y.o. N5A2130 at [redacted]w[redacted]d for phone visit for ongoing prenatal care.  She is currently monitored for the following issues for this high-risk pregnancy and has Thyrotoxicosis; Anemia; DENTAL CARIES; Ulcerative colitis ([place]; Constipation; ACNE, MILD; Prior pregnancy complicated by SGA (small for gestational age), antepartum; History of cesarean delivery affecting pregnancy; Rectal bleeding; Anal fissure; and Supervision of high risk pregnancy, antepartum on their problem list.  Patient reports {[id number]}.   .  .   . Denies leaking of fluid.   The following portions of the patient's history were reviewed and updated as appropriate: allergies, current medications, past family history, past medical history, past social history, past surgical history and problem list.   Objective:  There were no vitals filed for this visit. Self-Obtained  Fetal Status:           Assessment and Plan:  Pregnancy:  [id number] 266w3d1. Supervision of high risk pregnancy, antepartum *** - needs AFP, will schedule for lab visit in 1-2 weeks - HOB with MD only next visit - will schedule for anatomy scan today ASAP  2. History of cesarean delivery affecting pregnancy - desires RCS and BTL  3. Prior pregnancy complicated by SGA (small for gestational age), antepartum - will schedule anatomy scan today  4. Ulcerative rectosigmoiditis with rectal bleeding (HCC) *** - pt confirms she contacted GI MD responsible for managing her UC and she is planning to continue with Humera until 28 weeks - pt reports continued rectal bleeding, diarrhea, pain  5. Anemia, unspecified type -hgb 13 on NOB labs  6. Thyrotoxicosis without thyroid storm, unspecified thyrotoxicosis type *** - abnormal TSH with normal free T3/T4 first trimester - check TSH/Free T3/T4 next visit  Preterm labor symptoms and general obstetric precautions including but not limited to vaginal bleeding, contractions, leaking of fluid and fetal movement were reviewed in detail with the patient.  No follow-ups on file.  Future Appointments  Date Time Provider DeIndian River12/31/2020  8:15 AM Ladarian Bonczek, NiGerrie NordmannNP [facility] None    Time spent on virtual visit: *** minutes  NiClarisa FlingNP

## 2019-11-20 ENCOUNTER — Telehealth: Payer: Medicaid Other | Admitting: Women's Health

## 2019-11-26 ENCOUNTER — Encounter: Payer: Self-pay | Admitting: Certified Nurse Midwife

## 2019-11-26 ENCOUNTER — Telehealth (INDEPENDENT_AMBULATORY_CARE_PROVIDER_SITE_OTHER): Payer: Medicaid Other | Admitting: Certified Nurse Midwife

## 2019-11-26 DIAGNOSIS — O0992 Supervision of high risk pregnancy, unspecified, second trimester: Secondary | ICD-10-CM

## 2019-11-26 DIAGNOSIS — O34219 Maternal care for unspecified type scar from previous cesarean delivery: Secondary | ICD-10-CM

## 2019-11-26 DIAGNOSIS — Z3A21 21 weeks gestation of pregnancy: Secondary | ICD-10-CM

## 2019-11-26 DIAGNOSIS — O0990 Supervision of high risk pregnancy, unspecified, unspecified trimester: Secondary | ICD-10-CM

## 2019-11-26 NOTE — Progress Notes (Signed)
   TELEHEALTH OBSTETRICS PRENATAL VIRTUAL VIDEO VISIT ENCOUNTER NOTE  Provider location: Center for [employer] [facility] at [place]   I connected with Joyce Solomon on 11/26/19 at  3:00 PM EST by MyChart Video Encounter at home and verified that I am speaking with the correct person using two identifiers.   I discussed the limitations, risks, security and privacy concerns of performing an evaluation and management service virtually and the availability of in person appointments. I also discussed with the patient that there may be a patient responsible charge related to this service. The patient expressed understanding and agreed to proceed. Subjective:  Joyce Solomon is a 34 y.o. [phone] at 60w2dbeing seen today for ongoing prenatal care.  She is currently monitored for the following issues for this high-risk pregnancy and has Thyrotoxicosis; Anemia; DENTAL CARIES; Ulcerative colitis ([place]; Constipation; ACNE, MILD; Prior pregnancy complicated by SGA (small for gestational age), antepartum; History of cesarean delivery affecting pregnancy; Rectal bleeding; Anal fissure; and Supervision of high risk pregnancy, antepartum on their problem list.  Patient reports no complaints.  Contractions: Not present. Vag. Bleeding: None.  Movement: Present. Denies any leaking of fluid.   The following portions of the patient's history were reviewed and updated as appropriate: allergies, current medications, past family history, past medical history, past social history, past surgical history and problem list.   Objective:  There were no vitals filed for this visit.  Fetal Status:     Movement: Present     General:  Alert, oriented and cooperative. Patient is in no acute distress.  Respiratory: Normal respiratory effort, no problems with respiration noted  Mental Status: Normal mood and affect. Normal behavior. Normal judgment and thought content.  Rest of physical exam deferred due to type of  encounter  Imaging: No results found.  Assessment and Plan:  Pregnancy: [id number] [redacted]w[redacted]d. History of cesarean delivery affecting pregnancy - Hx of x2 C/S, plans repeat  - Unsure about BTL at this time d/t risks - educated and discussed risk as it relates to different methods of tubals  - Encouraged to discussed methods and options with MD at next appointment - patient verbalizes understanding  - Encouraged patient to take BP weekly and enter them into babyscipts   2. Supervision of high risk pregnancy, antepartum - Patient doing well, no complaints  - Currently still taking Humera for UC - discussed importance of dc by 28 weeks  - Reviewed panorama results with patient that were drawn on 12/2 - Anticipatory guidance on upcoming appointments    Preterm labor symptoms and general obstetric precautions including but not limited to vaginal bleeding, contractions, leaking of fluid and fetal movement were reviewed in detail with the patient. I discussed the assessment and treatment plan with the patient. The patient was provided an opportunity to ask questions and all were answered. The patient agreed with the plan and demonstrated an understanding of the instructions. The patient was advised to call back or seek an in-person office evaluation/[facility] any urgent or concerning symptoms. Please refer to After Visit Summary for other counseling recommendations.   I provided 9 minutes of face-to-face time during this encounter.  Return in about 4 weeks (around 12/24/2019) for ROB- with MD to discuss repeat C/S and BTL.   VeLajean ManesCNInterioror WoDean Foods CompanyCoTaylorsville

## 2019-12-23 ENCOUNTER — Encounter: Payer: Self-pay | Admitting: Obstetrics and Gynecology

## 2019-12-23 ENCOUNTER — Other Ambulatory Visit: Payer: Self-pay

## 2019-12-23 ENCOUNTER — Ambulatory Visit (INDEPENDENT_AMBULATORY_CARE_PROVIDER_SITE_OTHER): Payer: Medicaid Other | Admitting: Obstetrics and Gynecology

## 2019-12-23 VITALS — BP 108/72 | HR 94 | Wt 150.0 lb

## 2019-12-23 DIAGNOSIS — E0590 Thyrotoxicosis, unspecified without thyrotoxic crisis or storm: Secondary | ICD-10-CM

## 2019-12-23 DIAGNOSIS — O09299 Supervision of pregnancy with other poor reproductive or obstetric history, unspecified trimester: Secondary | ICD-10-CM

## 2019-12-23 DIAGNOSIS — Z3A25 25 weeks gestation of pregnancy: Secondary | ICD-10-CM

## 2019-12-23 DIAGNOSIS — O34219 Maternal care for unspecified type scar from previous cesarean delivery: Secondary | ICD-10-CM

## 2019-12-23 DIAGNOSIS — O09292 Supervision of pregnancy with other poor reproductive or obstetric history, second trimester: Secondary | ICD-10-CM

## 2019-12-23 DIAGNOSIS — O0990 Supervision of high risk pregnancy, unspecified, unspecified trimester: Secondary | ICD-10-CM

## 2019-12-23 DIAGNOSIS — O0992 Supervision of high risk pregnancy, unspecified, second trimester: Secondary | ICD-10-CM

## 2019-12-23 NOTE — Patient Instructions (Signed)

## 2019-12-23 NOTE — Progress Notes (Signed)
Subjective:  Joyce Solomon is a 34 y.o. [phone] at 27w1dbeing seen today for ongoing prenatal care.  She is currently monitored for the following issues for this high-risk pregnancy and has Thyrotoxicosis; Anemia; DENTAL CARIES; Ulcerative colitis ([place]; Constipation; ACNE, MILD; Prior pregnancy complicated by SGA (small for gestational age), antepartum; History of cesarean delivery affecting pregnancy; Rectal bleeding; Anal fissure; and Supervision of high risk pregnancy, antepartum on their problem list.  Patient reports no complaints.  Contractions: Not present. Vag. Bleeding: None.  Movement: Present. Denies leaking of fluid.   The following portions of the patient's history were reviewed and updated as appropriate: allergies, current medications, past family history, past medical history, past social history, past surgical history and problem list. Problem list updated.  Objective:   Vitals:   12/23/19 1453  BP: 108/72  Pulse: 94  Weight: 150 lb (68 kg)    Fetal Status: Fetal Heart Rate (bpm): 145   Movement: Present     General:  Alert, oriented and cooperative. Patient is in no acute distress.  Skin: Skin is warm and dry. No rash noted.   Cardiovascular: Normal heart rate noted  Respiratory: Normal respiratory effort, no problems with respiration noted  Abdomen: Soft, gravid, appropriate for gestational age. Pain/Pressure: Absent     Pelvic:  Cervical exam deferred        Extremities: Normal range of motion.  Edema: None  Mental Status: Normal mood and affect. Normal behavior. Normal judgment and thought content.   Urinalysis:      Assessment and Plan:  Pregnancy: [id number] 282w1d1. Supervision of high risk pregnancy, antepartum Stable Glucola next visit Reschedule anatomy scan Undecided about BTL at this time Contraception information provided to pt Revisit at next appt  2. History of cesarean delivery affecting pregnancy For repeat at 39 weeks  3. Prior  pregnancy complicated by SGA (small for gestational age), antepartum U/S ordered  4. Thyrotoxicosis without thyroid storm, unspecified thyrotoxicosis type Will check thyroid function with 28 week labs  Preterm labor symptoms and general obstetric precautions including but not limited to vaginal bleeding, contractions, leaking of fluid and fetal movement were reviewed in detail with the patient. Please refer to After Visit Summary for other counseling recommendations.  Return in about 4 weeks (around 01/20/2020) for OB visit, face to face, MD provider, fasting appt for Glucola.   ErChancy MilroyMD

## 2019-12-29 ENCOUNTER — Ambulatory Visit (HOSPITAL_COMMUNITY)
Admission: RE | Admit: 2019-12-29 | Payer: Medicaid Other | Source: Ambulatory Visit | Attending: Nurse Practitioner | Admitting: Nurse Practitioner

## 2019-12-29 ENCOUNTER — Ambulatory Visit (HOSPITAL_COMMUNITY): Payer: Medicaid Other | Attending: Obstetrics and Gynecology

## 2020-01-20 ENCOUNTER — Other Ambulatory Visit: Payer: Self-pay

## 2020-01-20 ENCOUNTER — Ambulatory Visit (INDEPENDENT_AMBULATORY_CARE_PROVIDER_SITE_OTHER): Payer: Medicaid Other | Admitting: Obstetrics & Gynecology

## 2020-01-20 ENCOUNTER — Other Ambulatory Visit: Payer: Medicaid Other

## 2020-01-20 VITALS — BP 107/69 | HR 100 | Wt 151.8 lb

## 2020-01-20 DIAGNOSIS — O09293 Supervision of pregnancy with other poor reproductive or obstetric history, third trimester: Secondary | ICD-10-CM

## 2020-01-20 DIAGNOSIS — O09299 Supervision of pregnancy with other poor reproductive or obstetric history, unspecified trimester: Secondary | ICD-10-CM

## 2020-01-20 DIAGNOSIS — Z3A29 29 weeks gestation of pregnancy: Secondary | ICD-10-CM

## 2020-01-20 DIAGNOSIS — O0990 Supervision of high risk pregnancy, unspecified, unspecified trimester: Secondary | ICD-10-CM

## 2020-01-20 DIAGNOSIS — O0993 Supervision of high risk pregnancy, unspecified, third trimester: Secondary | ICD-10-CM

## 2020-01-20 DIAGNOSIS — O34219 Maternal care for unspecified type scar from previous cesarean delivery: Secondary | ICD-10-CM

## 2020-01-20 NOTE — Patient Instructions (Signed)
Postpartum Tubal Ligation Postpartum tubal ligation (PPTL) is a procedure to close the fallopian tubes. This is done so that you cannot get pregnant. When the fallopian tubes are closed, the eggs that the ovaries release cannot enter the uterus, and sperm cannot reach the eggs. PPTL is done right after childbirth or 1-2 days after childbirth, before the uterus returns to its normal location. If you have a cesarean section, it can be performed at the same time as the procedure. Having this done after childbirth does not make your stay in the hospital longer. PPTL is sometimes called "getting your tubes tied." You should not have this procedure if you want to get pregnant again or if you are unsure about having more children. Tell a health care provider about:  Any allergies you have.  All medicines you are taking, including vitamins, herbs, eye drops, creams, and over-the-counter medicines.  Any problems you or family members have had with anesthetic medicines.  Any blood disorders you have.  Any surgeries you have had.  Any medical conditions you have or have had.  Any past pregnancies. What are the risks? Generally, this is a safe procedure. However, problems may occur, including:  Infection.  Bleeding.  Injury to other organs in the abdomen.  Side effects from anesthetic medicines.  Failure of the procedure. If this happens, you could get pregnant.  Having a fertilized egg attach outside the uterus (ectopic pregnancy). What happens before the procedure?  Ask your health care provider about: ? How much pain you can expect to have. ? What medicines you will be given for pain, especially if you are planning to breastfeed. What happens during the procedure? If you had a vaginal delivery:  You will be given one or more of the following: ? A medicine to help you relax (sedative). ? A medicine to numb the area (local anesthetic). ? A medicine to make you fall asleep (general  anesthetic). ? A medicine that is injected into an area of your body to numb everything below the injection site (regional anesthetic).  If you have been given a general anesthetic, a tube will be put down your throat to help you breathe.  An IV will be inserted into one of your veins.  Your bladder may be emptied with a small tube (catheter).  An incision will be made just below your belly button.  Your fallopian tubes will be located and brought up through the incision.  Your fallopian tubes will be tied off, burned (cauterized), or blocked with a clip, ring, or clamp. A small part in the center of each fallopian tube may be removed.  The incision will be closed with stitches (sutures).  A bandage (dressing) will be placed over the incision. If you had a cesarean delivery:  Tubal ligation will be done through the incision that was used for the cesarean delivery of your baby.  The incision will be closed with sutures.  A dressing will be placed over the incision. The procedure may vary among health care providers and hospitals. What happens after the procedure?  Your blood pressure, heart rate, breathing rate, and blood oxygen level will be monitored until you leave the hospital.  You will be given pain medicine as needed.  Do not drive for 24 hours if you were given a sedative during your procedure. Summary  Postpartum tubal ligation is a procedure that closes the fallopian tubes so you cannot get pregnant anymore.  This procedure is done while you are still  in the hospital after childbirth. If you have a cesarean section, it can be performed at the same time.  Having this done after childbirth does not make your stay in the hospital longer.  Postpartum tubal ligation is considered permanent. You should not have this procedure if you want to get pregnant again or if you are unsure about having more children.  Talk to your health care provider to see if this procedure is  right for you. This information is not intended to replace advice given to you by your health care provider. Make sure you discuss any questions you have with your health care provider. Document Revised: 04/21/2019 Document Reviewed: 09/26/2018 Elsevier Patient Education  2020 [employer]. Contraception Choices Contraception, also called birth control, refers to methods or devices that prevent pregnancy. Hormonal methods Contraceptive implant  A contraceptive implant is a thin, plastic tube that contains a hormone. It is inserted into the upper part of the arm. It can remain in place for up to 3 years. Progestin-only injections Progestin-only injections are injections of progestin, a synthetic form of the hormone progesterone. They are given every 3 months by a health care provider. Birth control pills  Birth control pills are pills that contain hormones that prevent pregnancy. They must be taken once a day, preferably at the same time each day. Birth control patch  The birth control patch contains hormones that prevent pregnancy. It is placed on the skin and must be changed once a week for three weeks and removed on the fourth week. A prescription is needed to use this method of contraception. Vaginal ring  A vaginal ring contains hormones that prevent pregnancy. It is placed in the vagina for three weeks and removed on the fourth week. After that, the process is repeated with a new ring. A prescription is needed to use this method of contraception. Emergency contraceptive Emergency contraceptives prevent pregnancy after unprotected sex. They come in pill form and can be taken up to 5 days after sex. They work best the sooner they are taken after having sex. Most emergency contraceptives are available without a prescription. This method should not be used as your only form of birth control. Barrier methods Female condom  A female condom is a thin sheath that is worn over the penis during  sex. Condoms keep sperm from going inside a woman's body. They can be used with a spermicide to increase their effectiveness. They should be disposed after a single use. Female condom  A female condom is a soft, loose-fitting sheath that is put into the vagina before sex. The condom keeps sperm from going inside a woman's body. They should be disposed after a single use. Diaphragm  A diaphragm is a soft, dome-shaped barrier. It is inserted into the vagina before sex, along with a spermicide. The diaphragm blocks sperm from entering the uterus, and the spermicide kills sperm. A diaphragm should be left in the vagina for 6-8 hours after sex and removed within 24 hours. A diaphragm is prescribed and fitted by a health care provider. A diaphragm should be replaced every 1-2 years, after giving birth, after gaining more than 15 lb (6.8 kg), and after pelvic surgery. Cervical cap  A cervical cap is a round, soft latex or plastic cup that fits over the cervix. It is inserted into the vagina before sex, along with spermicide. It blocks sperm from entering the uterus. The cap should be left in place for 6-8 hours after sex and removed within  48 hours. A cervical cap must be prescribed and fitted by a health care provider. It should be replaced every 2 years. Sponge  A sponge is a soft, circular piece of polyurethane foam with spermicide on it. The sponge helps block sperm from entering the uterus, and the spermicide kills sperm. To use it, you make it wet and then insert it into the vagina. It should be inserted before sex, left in for at least 6 hours after sex, and removed and thrown away within 30 hours. Spermicides Spermicides are chemicals that kill or block sperm from entering the cervix and uterus. They can come as a cream, jelly, suppository, foam, or tablet. A spermicide should be inserted into the vagina with an applicator at least 14-48 minutes before sex to allow time for it to work. The process  must be repeated every time you have sex. Spermicides do not require a prescription. Intrauterine contraception Intrauterine device (IUD) An IUD is a T-shaped device that is put in a woman's uterus. There are two types:  Hormone IUD.This type contains progestin, a synthetic form of the hormone progesterone. This type can stay in place for 3-5 years.  Copper IUD.This type is wrapped in copper wire. It can stay in place for 10 years.  Permanent methods of contraception Female tubal ligation In this method, a woman's fallopian tubes are sealed, tied, or blocked during surgery to prevent eggs from traveling to the uterus. Hysteroscopic sterilization In this method, a small, flexible insert is placed into each fallopian tube. The inserts cause scar tissue to form in the fallopian tubes and block them, so sperm cannot reach an egg. The procedure takes about 3 months to be effective. Another form of birth control must be used during those 3 months. Female sterilization This is a procedure to tie off the tubes that carry sperm (vasectomy). After the procedure, the man can still ejaculate fluid (semen). Natural planning methods Natural family planning In this method, a couple does not have sex on days when the woman could become pregnant. Calendar method This means keeping track of the length of each menstrual cycle, identifying the days when pregnancy can happen, and not having sex on those days. Ovulation method In this method, a couple avoids sex during ovulation. Symptothermal method This method involves not having sex during ovulation. The woman typically checks for ovulation by watching changes in her temperature and in the consistency of cervical mucus. Post-ovulation method In this method, a couple waits to have sex until after ovulation. Summary  Contraception, also called birth control, means methods or devices that prevent pregnancy.  Hormonal methods of contraception include implants,  injections, pills, patches, vaginal rings, and emergency contraceptives.  Barrier methods of contraception can include female condoms, female condoms, diaphragms, cervical caps, sponges, and spermicides.  There are two types of IUDs (intrauterine devices). An IUD can be put in a woman's uterus to prevent pregnancy for 3-5 years.  Permanent sterilization can be done through a procedure for males, females, or both.  Natural family planning methods involve not having sex on days when the woman could become pregnant. This information is not intended to replace advice given to you by your health care provider. Make sure you discuss any questions you have with your health care provider. Document Revised: 11/08/2017 Document Reviewed: 12/09/2016 Elsevier Patient Education  2020 [employer].

## 2020-01-20 NOTE — Progress Notes (Signed)
Patient reports fetal movement, denies pain.

## 2020-01-20 NOTE — Progress Notes (Signed)
   PRENATAL VISIT NOTE  Subjective:  Joyce Solomon is a 34 y.o. [phone] at 17w1dbeing seen today for ongoing prenatal care.  She is currently monitored for the following issues for this high-risk pregnancy and has Thyrotoxicosis; Anemia; DENTAL CARIES; Ulcerative colitis ([place]; Constipation; ACNE, MILD; Prior pregnancy complicated by SGA (small for gestational age), antepartum; History of cesarean delivery affecting pregnancy; Rectal bleeding; Anal fissure; and Supervision of high risk pregnancy, antepartum on their problem list.  Patient reports no complaints.  Contractions: Not present. Vag. Bleeding: None.  Movement: Present. Denies leaking of fluid.   The following portions of the patient's history were reviewed and updated as appropriate: allergies, current medications, past family history, past medical history, past social history, past surgical history and problem list.   Objective:   Vitals:   01/20/20 0939  BP: 107/69  Pulse: 100  Weight: 151 lb 12.8 oz (68.9 kg)    Fetal Status: Fetal Heart Rate (bpm): 141   Movement: Present     General:  Alert, oriented and cooperative. Patient is in no acute distress.  Skin: Skin is warm and dry. No rash noted.   Cardiovascular: Normal heart rate noted  Respiratory: Normal respiratory effort, no problems with respiration noted  Abdomen: Soft, gravid, appropriate for gestational age.  Pain/Pressure: Absent     Pelvic: Cervical exam deferred        Extremities: Normal range of motion.  Edema: None  Mental Status: Normal mood and affect. Normal behavior. Normal judgment and thought content.   Assessment and Plan:  Pregnancy: [id number] 273w1d. Supervision of high risk pregnancy, antepartum Routine labs - Glucose Tolerance, 2 Hours w/1 Hour - CBC - HIV antibody (with reflex) - RPR - US[place]FM OB COMP + 14 WK; Future  2. History of cesarean delivery affecting pregnancy Has not had US[place]oreahis pregnancy  3. Prior pregnancy  complicated by SGA (small for gestational age), antepartum AGA last pregnancy  Preterm labor symptoms and general obstetric precautions including but not limited to vaginal bleeding, contractions, leaking of fluid and fetal movement were reviewed in detail with the patient. Please refer to After Visit Summary for other counseling recommendations.   Return in about 2 weeks (around 02/03/2020) for virtual.  Future Appointments  Date Time Provider DeStella3/10/2020  1:15 PM [facility]FC-US  01/30/2020  1:15 PM WH-MFC [place] WH-MFCUS MFC-US  02/03/2020  2:00 PM HaShelly BombardMD [facility] None    JaEmeterio ReeveMD

## 2020-01-21 LAB — GLUCOSE TOLERANCE, 2 HOURS W/ 1HR
Glucose, 1 hour: 133 mg/dL (ref 65–179)
Glucose, 2 hour: 88 mg/dL (ref 65–152)
Glucose, Fasting: 80 mg/dL (ref 65–91)

## 2020-01-21 LAB — CBC
Hematocrit: 32.8 % — ABNORMAL LOW (ref 34.0–46.6)
Hemoglobin: 11.4 g/dL (ref 11.1–15.9)
MCH: 31.4 pg (ref 26.6–33.0)
MCHC: 34.8 g/dL (ref 31.5–35.7)
MCV: 90 fL (ref 79–97)
Platelets: 262 10*3/uL (ref 150–450)
RBC: 3.63 x10E6/uL — ABNORMAL LOW (ref 3.77–5.28)
RDW: 12.8 % (ref 11.7–15.4)
WBC: 10.7 10*3/uL (ref 3.4–10.8)

## 2020-01-21 LAB — HIV ANTIBODY (ROUTINE TESTING W REFLEX): HIV Screen 4th Generation wRfx: NONREACTIVE

## 2020-01-21 LAB — RPR: RPR Ser Ql: NONREACTIVE

## 2020-01-30 ENCOUNTER — Ambulatory Visit (HOSPITAL_COMMUNITY)
Admission: RE | Admit: 2020-01-30 | Payer: Medicaid Other | Source: Ambulatory Visit | Attending: Nurse Practitioner | Admitting: Nurse Practitioner

## 2020-01-30 ENCOUNTER — Ambulatory Visit (HOSPITAL_COMMUNITY): Payer: Medicaid Other

## 2020-02-03 ENCOUNTER — Encounter: Payer: Self-pay | Admitting: Obstetrics

## 2020-02-03 ENCOUNTER — Telehealth (INDEPENDENT_AMBULATORY_CARE_PROVIDER_SITE_OTHER): Payer: Medicaid Other | Admitting: Obstetrics

## 2020-02-03 DIAGNOSIS — Z3A31 31 weeks gestation of pregnancy: Secondary | ICD-10-CM

## 2020-02-03 DIAGNOSIS — O09299 Supervision of pregnancy with other poor reproductive or obstetric history, unspecified trimester: Secondary | ICD-10-CM

## 2020-02-03 DIAGNOSIS — O34219 Maternal care for unspecified type scar from previous cesarean delivery: Secondary | ICD-10-CM

## 2020-02-03 DIAGNOSIS — O09293 Supervision of pregnancy with other poor reproductive or obstetric history, third trimester: Secondary | ICD-10-CM

## 2020-02-03 DIAGNOSIS — O0990 Supervision of high risk pregnancy, unspecified, unspecified trimester: Secondary | ICD-10-CM

## 2020-02-03 NOTE — Progress Notes (Signed)
S/w pt for virtual visit, pt reports fetal movement, denies pain, pt states that her BP cuff is not working, advised pt to bring it into the office at next visit.

## 2020-02-03 NOTE — Progress Notes (Signed)
   [place] VIRTUAL VIDEO VISIT ENCOUNTER NOTE  Provider location: [facility] for [employer] [facility] at [place]   I connected with Joyce Solomon on 02/03/20 at  2:00 PM EDT by OB MyChart Video Encounter at home and verified that I am speaking with the correct person using two identifiers.   I discussed the limitations, risks, security and privacy concerns of performing an evaluation and management service virtually and the availability of in person appointments. I also discussed with the patient that there may be a patient responsible charge related to this service. The patient expressed understanding and agreed to proceed. Subjective:  Joyce Solomon is a 34 y.o. [phone] at 70w1dbeing seen today for ongoing prenatal care.  She is currently monitored for the following issues for this high-risk pregnancy and has Thyrotoxicosis; Anemia; DENTAL CARIES; Ulcerative colitis ([place]; Constipation; ACNE, MILD; Prior pregnancy complicated by SGA (small for gestational age), antepartum; History of cesarean delivery affecting pregnancy; Rectal bleeding; Anal fissure; and Supervision of high risk pregnancy, antepartum on their problem list.  Patient reports no complaints.  Contractions: Not present. Vag. Bleeding: None.  Movement: Present. Denies any leaking of fluid.   The following portions of the patient's history were reviewed and updated as appropriate: allergies, current medications, past family history, past medical history, past social history, past surgical history and problem list.   Objective:  There were no vitals filed for this visit.  Fetal Status:     Movement: Present     General:  Alert, oriented and cooperative. Patient is in no acute distress.  Respiratory: Normal respiratory effort, no problems with respiration noted  Mental Status: Normal mood and affect. Normal behavior. Normal judgment and thought content.  Rest of physical exam deferred due to type of  encounter  Imaging: No results found.  Assessment and Plan:  Pregnancy: [id number] 3108w1d. Supervision of high risk pregnancy, antepartum  2. History of cesarean delivery affecting pregnancy - for a repeat C/S at 39 weeks.  Does not want tubal  3. Prior pregnancy complicated by SGA (small for gestational age), antepartum - followed by MFM  Preterm labor symptoms and general obstetric precautions including but not limited to vaginal bleeding, contractions, leaking of fluid and fetal movement were reviewed in detail with the patient. I discussed the assessment and treatment plan with the patient. The patient was provided an opportunity to ask questions and all were answered. The patient agreed with the plan and demonstrated an understanding of the instructions. The patient was advised to call back or seek an in-person office evaluation/[facility] any urgent or concerning symptoms. Please refer to After Visit Summary for other counseling recommendations.   I provided 10 minutes of face-to-face time during this encounter.  Return for MyChart.  Future Appointments  Date Time Provider DeAmalga3/16/2021  2:00 PM HaShelly BombardMD CWPlantationone  02/11/2020 11:15 AM WHVill[facility] GeorgeURSE WHM[place] GreenFC-US  02/11/2020 11:15 AM WHLlanoSKorea WH-MFCUS MFC-US    ChBaltazar NajjarMDBucyrusor WoMi[facility] Center[facility]ang[facility]oup 02/03/2020

## 2020-02-11 ENCOUNTER — Other Ambulatory Visit: Payer: Self-pay

## 2020-02-11 ENCOUNTER — Ambulatory Visit (HOSPITAL_COMMUNITY)
Admission: RE | Admit: 2020-02-11 | Discharge: 2020-02-11 | Disposition: A | Discharge status: Home / Self Care | Payer: Medicaid Other | Source: Ambulatory Visit | Attending: Nurse Practitioner | Admitting: Nurse Practitioner

## 2020-02-11 ENCOUNTER — Encounter (HOSPITAL_COMMUNITY): Payer: Self-pay

## 2020-02-11 ENCOUNTER — Ambulatory Visit (HOSPITAL_COMMUNITY): Payer: Medicaid Other | Admitting: *Deleted

## 2020-02-11 ENCOUNTER — Other Ambulatory Visit (HOSPITAL_COMMUNITY): Payer: Self-pay | Admitting: *Deleted

## 2020-02-11 DIAGNOSIS — O0990 Supervision of high risk pregnancy, unspecified, unspecified trimester: Secondary | ICD-10-CM | POA: Diagnosis present

## 2020-02-11 DIAGNOSIS — K51311 Ulcerative (chronic) rectosigmoiditis with rectal bleeding: Secondary | ICD-10-CM | POA: Insufficient documentation

## 2020-02-11 DIAGNOSIS — O0930 Supervision of pregnancy with insufficient antenatal care, unspecified trimester: Secondary | ICD-10-CM

## 2020-02-11 DIAGNOSIS — O99613 Diseases of the digestive system complicating pregnancy, third trimester: Secondary | ICD-10-CM

## 2020-02-11 DIAGNOSIS — O09293 Supervision of pregnancy with other poor reproductive or obstetric history, third trimester: Secondary | ICD-10-CM

## 2020-02-11 DIAGNOSIS — O0933 Supervision of pregnancy with insufficient antenatal care, third trimester: Secondary | ICD-10-CM

## 2020-02-11 DIAGNOSIS — Z3A32 32 weeks gestation of pregnancy: Secondary | ICD-10-CM

## 2020-02-11 DIAGNOSIS — O34219 Maternal care for unspecified type scar from previous cesarean delivery: Secondary | ICD-10-CM | POA: Insufficient documentation

## 2020-02-11 DIAGNOSIS — O09299 Supervision of pregnancy with other poor reproductive or obstetric history, unspecified trimester: Secondary | ICD-10-CM

## 2020-02-11 DIAGNOSIS — Z363 Encounter for antenatal screening for malformations: Secondary | ICD-10-CM | POA: Diagnosis not present

## 2020-02-14 ENCOUNTER — Inpatient Hospital Stay (HOSPITAL_COMMUNITY)
Admission: AD | Admit: 2020-02-14 | Discharge: 2020-02-14 | Disposition: A | Discharge status: Home / Self Care | Payer: Medicaid Other | Attending: Obstetrics & Gynecology | Admitting: Obstetrics & Gynecology

## 2020-02-14 ENCOUNTER — Other Ambulatory Visit: Payer: Self-pay

## 2020-02-14 ENCOUNTER — Encounter (HOSPITAL_COMMUNITY): Payer: Self-pay | Admitting: Obstetrics & Gynecology

## 2020-02-14 DIAGNOSIS — M79652 Pain in left thigh: Secondary | ICD-10-CM | POA: Insufficient documentation

## 2020-02-14 DIAGNOSIS — Y939 Activity, unspecified: Secondary | ICD-10-CM | POA: Insufficient documentation

## 2020-02-14 DIAGNOSIS — D509 Iron deficiency anemia, unspecified: Secondary | ICD-10-CM | POA: Insufficient documentation

## 2020-02-14 DIAGNOSIS — Y929 Unspecified place or not applicable: Secondary | ICD-10-CM | POA: Diagnosis not present

## 2020-02-14 DIAGNOSIS — M25551 Pain in right hip: Secondary | ICD-10-CM | POA: Diagnosis not present

## 2020-02-14 DIAGNOSIS — O9A213 Injury, poisoning and certain other consequences of external causes complicating pregnancy, third trimester: Secondary | ICD-10-CM | POA: Diagnosis not present

## 2020-02-14 DIAGNOSIS — W109XXA Fall (on) (from) unspecified stairs and steps, initial encounter: Secondary | ICD-10-CM | POA: Diagnosis not present

## 2020-02-14 DIAGNOSIS — Z3A32 32 weeks gestation of pregnancy: Secondary | ICD-10-CM | POA: Diagnosis not present

## 2020-02-14 DIAGNOSIS — M25552 Pain in left hip: Secondary | ICD-10-CM | POA: Diagnosis not present

## 2020-02-14 DIAGNOSIS — Z79899 Other long term (current) drug therapy: Secondary | ICD-10-CM | POA: Insufficient documentation

## 2020-02-14 DIAGNOSIS — O26893 Other specified pregnancy related conditions, third trimester: Secondary | ICD-10-CM | POA: Insufficient documentation

## 2020-02-14 DIAGNOSIS — Z87891 Personal history of nicotine dependence: Secondary | ICD-10-CM | POA: Diagnosis not present

## 2020-02-14 DIAGNOSIS — Y999 Unspecified external cause status: Secondary | ICD-10-CM | POA: Insufficient documentation

## 2020-02-14 DIAGNOSIS — O99013 Anemia complicating pregnancy, third trimester: Secondary | ICD-10-CM | POA: Diagnosis not present

## 2020-02-14 DIAGNOSIS — W19XXXA Unspecified fall, initial encounter: Secondary | ICD-10-CM

## 2020-02-14 DIAGNOSIS — M79651 Pain in right thigh: Secondary | ICD-10-CM | POA: Diagnosis not present

## 2020-02-14 HISTORY — DX: Headache, unspecified: R51.9

## 2020-02-14 MED ORDER — ACETAMINOPHEN 500 MG PO TABS
1000.0000 mg | ORAL_TABLET | Freq: Once | ORAL | Status: AC
  Administered 2020-02-14: 1000 mg via ORAL
  Filled 2020-02-14: qty 2

## 2020-02-14 NOTE — MAU Note (Signed)
Pt states she slipped down 3 stairs; impact on her back.  Pt states she feels pain bilat hips & interior thighs with movement; rates as 7/10 pain scale. Pt also states she feels abdominal tightness since falling.  Denies VB or LOF.  States +FM.  Pt also c/o h/a left temporal, 4/10 on pain scale; has not taken meds.  Denies PreE symptoms.

## 2020-02-14 NOTE — MAU Provider Note (Signed)
History     CSN: [id number]  Arrival date and time: 02/14/20 1407   First Provider Initiated Contact with Patient 02/14/20 1454      Chief Complaint  Patient presents with  . Fall   HPI   [name] is a 34 y.o. female [phone] @ 36w5dhere in MAU with fall that occurred 1 hour ago at home. She slipped down the steps and landed on her butt. She did not hit her abdomen.  She has no pain, no bleeding. + fetal movement.   OB History    Gravida  6   Para  3   Term  3   Preterm      AB  2   Living  3     SAB  1   TAB  1   Ectopic      Multiple  0   Live Births  3           Past Medical History:  Diagnosis Date  . Allergy    SEASONAL  . Blood transfusion without reported diagnosis   . Headache   . Iron Deficiency Anemia 2008   s/p transfusions 2009, 2010; inconsistent compliance with PO iron.   . Non compliance w medication regimen 06/2015   stopped Remicade, may not have been taking Azulfidine.  .Marland KitchenUlcerative Colitis 2007   on Humira    Past Surgical History:  Procedure Laterality Date  . CESAREAN SECTION N/A 12/09/2013   Procedure: Primary Cesarean Section Delivery Baby Girl @ 1959, Apgars 8/8;  Surgeon: KLogan Bores MD;  [place];  Service: Obstetrics;  Laterality: N/A;  . CESAREAN SECTION N/A 08/14/2017   Procedure: CESAREAN SECTION;  Surgeon: RPaula Compton MD;  [place]  Service: Obstetrics;  Laterality: N/A;  . COLONOSCOPY  01/19/2011   Dr GPenelope Coop no active UC  . COLONOSCOPY W/ BIOPSIES N/A 04/2014   Dr KDeatra Ina Left sided colitis from prox descending to rectum; no inflammation at or proximal to splenic flexure  . DILATION AND EVACUATION  08/21/2012   Procedure: DILATATION AND EVACUATION;  Surgeon: KLogan Bores MD;  [place];  Service: Gynecology  . TONSILLECTOMY      Family History  Problem Relation Age of Onset  . Miscarriages / Stillbirths Other   . Diabetes Sister   .  Hypotension Neg Hx   . Alcohol abuse Neg Hx   . Arthritis Neg Hx   . Asthma Neg Hx   . Birth defects Neg Hx   . COPD Neg Hx   . Depression Neg Hx   . Drug abuse Neg Hx   . Hearing loss Neg Hx   . Heart disease Neg Hx   . Hyperlipidemia Neg Hx   . Hypertension Neg Hx   . Kidney disease Neg Hx   . Learning disabilities Neg Hx   . Mental illness Neg Hx   . Mental retardation Neg Hx   . Colon cancer Neg Hx   . Colon polyps Neg Hx   . Esophageal cancer Neg Hx   . Stomach cancer Neg Hx   . Rectal cancer Neg Hx     Social History   Tobacco Use  . Smoking status: Former Smoker    Years: 3.00    Quit date: 12/13/2018    Years since quitting: 1.1  . Smokeless tobacco: Never Used  . Tobacco comment: smokes a cig "every blue moon"  Substance Use Topics  . Alcohol use:  Not Currently    Alcohol/week: 0.0 standard drinks    Comment: occassional  . Drug use: No    Allergies:  Allergies  Allergen Reactions  . Latex Rash  . Remicade [Infliximab] Itching    Medications Prior to Admission  Medication Sig Dispense Refill Last Dose  . Blood Pressure Monitoring (BLOOD PRESSURE KIT) DEVI 1 Device by Does not apply route as needed. 1 Device 0   . ondansetron (ZOFRAN ODT) 8 MG disintegrating tablet Take 1 tablet (8 mg total) by mouth every 8 (eight) hours as needed for nausea or vomiting. (Patient not taking: Reported on 09/09/2019) 60 tablet 0   . Prenatal Vit w/Fe-Methylfol-FA (PNV PO) Take by mouth.     . promethazine (PHENERGAN) 12.5 MG tablet Take 1 tablet (12.5 mg total) by mouth every 6 (six) hours as needed for nausea or vomiting. Take before sleeping. (Patient not taking: Reported on 09/09/2019) 30 tablet 0    No results found for this or any previous visit (from the past 48 hour(s)).  Review of Systems  Gastrointestinal: Negative for abdominal pain.  Genitourinary: Negative for vaginal bleeding and vaginal discharge.  Musculoskeletal: Negative for back pain.   Physical Exam    Blood pressure 118/62, pulse 97, temperature 98.1 F (36.7 C), temperature source Oral, resp. rate 16, height 4' 11"  (1.499 m), weight 69.5 kg, last menstrual period 06/30/2019, SpO2 100 %.  Physical Exam  Constitutional: She is oriented to person, place, and time. She appears well-developed and well-nourished. No distress.  HENT:  Head: Normocephalic.  Eyes: Pupils are equal, round, and reactive to light.  GI: Soft. She exhibits no distension. There is no abdominal tenderness. There is no rebound and no guarding.  Musculoskeletal:        General: Normal range of motion.  Neurological: She is alert and oriented to person, place, and time.  Skin: Skin is warm. She is not diaphoretic.  Psychiatric: Her behavior is normal.   Fetal Tracing: Baseline: 125 bpm Variability: Moderate  Accelerations: 15x15 Decelerations: None Toco: None  MAU Course  Procedures  None  MDM  4 hours of monitoring complete, No pain at the time of DC.   Assessment and Plan   A:  1. Fall, initial encounter   2. [redacted] weeks gestation of pregnancy     P:  Discharge home in stable condition  4 hours of monitoring complete Return to MAU if symptoms worsen  Follow up with OB as scheduled.   Lezlie Lye, NP 02/14/2020 7:57 PM

## 2020-02-14 NOTE — Discharge Instructions (Signed)

## 2020-02-17 ENCOUNTER — Telehealth (INDEPENDENT_AMBULATORY_CARE_PROVIDER_SITE_OTHER): Payer: Medicaid Other | Admitting: Obstetrics

## 2020-02-17 ENCOUNTER — Encounter: Payer: Self-pay | Admitting: Obstetrics

## 2020-02-17 DIAGNOSIS — E0590 Thyrotoxicosis, unspecified without thyrotoxic crisis or storm: Secondary | ICD-10-CM

## 2020-02-17 DIAGNOSIS — O34219 Maternal care for unspecified type scar from previous cesarean delivery: Secondary | ICD-10-CM | POA: Diagnosis not present

## 2020-02-17 DIAGNOSIS — O99613 Diseases of the digestive system complicating pregnancy, third trimester: Secondary | ICD-10-CM | POA: Diagnosis not present

## 2020-02-17 DIAGNOSIS — Z3A33 33 weeks gestation of pregnancy: Secondary | ICD-10-CM

## 2020-02-17 DIAGNOSIS — K51311 Ulcerative (chronic) rectosigmoiditis with rectal bleeding: Secondary | ICD-10-CM | POA: Diagnosis not present

## 2020-02-17 DIAGNOSIS — O0990 Supervision of high risk pregnancy, unspecified, unspecified trimester: Secondary | ICD-10-CM

## 2020-02-17 DIAGNOSIS — O99283 Endocrine, nutritional and metabolic diseases complicating pregnancy, third trimester: Secondary | ICD-10-CM

## 2020-02-17 DIAGNOSIS — O09299 Supervision of pregnancy with other poor reproductive or obstetric history, unspecified trimester: Secondary | ICD-10-CM | POA: Diagnosis not present

## 2020-02-17 NOTE — Progress Notes (Signed)
   [facility]LE[facility] VIRTUAL OBSTETRICS VISIT ENCOUNTER NOTE  I connected with Joyce Solomon on 02/17/20 at  2:00 PM EDT by telephone at home and verified that I am speaking with the correct person using two identifiers.   I discussed the limitations, risks, security and privacy concerns of performing an evaluation and management service by telephone and the availability of in person appointments. I also discussed with the patient that there may be a patient responsible charge related to this service. The patient expressed understanding and agreed to proceed.  Subjective:  Joyce Solomon is a 34 y.o. [phone] at 78w1dbeing followed for ongoing prenatal care.  She is currently monitored for the following issues for this high-risk pregnancy and has Thyrotoxicosis; Anemia; DENTAL CARIES; Ulcerative colitis ([place]; Constipation; ACNE, MILD; Prior pregnancy complicated by SGA (small for gestational age), antepartum; History of cesarean delivery affecting pregnancy; Rectal bleeding; Anal fissure; and Supervision of high risk pregnancy, antepartum on their problem list.  Patient reports no complaints. Reports fetal movement. Denies any contractions, bleeding or leaking of fluid.   The following portions of the patient's history were reviewed and updated as appropriate: allergies, current medications, past family history, past medical history, past social history, past surgical history and problem list.   Objective:   General:  Alert, oriented and cooperative.   Mental Status: Normal mood and affect perceived. Normal judgment and thought content.  Rest of physical exam deferred due to type of encounter  Assessment and Plan:  Pregnancy: [id number] 362w1d. Supervision of high risk pregnancy, antepartum  2. Prior pregnancy complicated by SGA (small for gestational age), antepartum  3. History of cesarean delivery affecting pregnancy  4. 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  Thyrotoxicosis without thyroid storm, unspecified thyrotoxicosis type  Follow up in 2 weeks.  MyChart -HOB  Preterm labor symptoms and general obstetric precautions including but not limited to vaginal bleeding, contractions, leaking of fluid and fetal movement were reviewed in detail with the patient.  I discussed the assessment and treatment plan with the patient. The patient was provided an opportunity to ask questions and all were answered. The patient agreed with the plan and demonstrated an understanding of the instructions. The patient was advised to call back or seek an in-person office evaluation/[facility] any urgent or concerning symptoms. Please refer to After Visit Summary for other counseling recommendations.   I provided 10 minutes of non-face-to-face time during this encounter.    Future Appointments  Date Time Provider DeNew Auburn4/13/2021  2:00 PM HaShelly BombardMD [facility] None  03/12/2020  2:00 PM WHWoodacreURSE WHAvonFC-US  03/12/2020  2:00 PM WH-MFC [place] WH-MFCUS MFC-US    Laurian Edrington, MD [facility] 02/17/2020

## 2020-02-25 ENCOUNTER — Encounter (HOSPITAL_COMMUNITY): Payer: Self-pay | Admitting: Family Medicine

## 2020-02-25 ENCOUNTER — Inpatient Hospital Stay (HOSPITAL_COMMUNITY)
Admission: AD | Admit: 2020-02-25 | Discharge: 2020-02-26 | Disposition: A | Discharge status: Home / Self Care | Payer: Medicaid Other | Attending: Family Medicine | Admitting: Family Medicine

## 2020-02-25 ENCOUNTER — Other Ambulatory Visit: Payer: Self-pay

## 2020-02-25 DIAGNOSIS — O09299 Supervision of pregnancy with other poor reproductive or obstetric history, unspecified trimester: Secondary | ICD-10-CM

## 2020-02-25 DIAGNOSIS — O34219 Maternal care for unspecified type scar from previous cesarean delivery: Secondary | ICD-10-CM

## 2020-02-25 DIAGNOSIS — Z3A34 34 weeks gestation of pregnancy: Secondary | ICD-10-CM | POA: Insufficient documentation

## 2020-02-25 DIAGNOSIS — Z3689 Encounter for other specified antenatal screening: Secondary | ICD-10-CM

## 2020-02-25 DIAGNOSIS — O26893 Other specified pregnancy related conditions, third trimester: Secondary | ICD-10-CM | POA: Insufficient documentation

## 2020-02-25 DIAGNOSIS — R102 Pelvic and perineal pain: Secondary | ICD-10-CM | POA: Insufficient documentation

## 2020-02-25 DIAGNOSIS — Z87891 Personal history of nicotine dependence: Secondary | ICD-10-CM | POA: Diagnosis not present

## 2020-02-25 DIAGNOSIS — R109 Unspecified abdominal pain: Secondary | ICD-10-CM | POA: Diagnosis present

## 2020-02-25 LAB — WET PREP, GENITAL
Clue Cells Wet Prep HPF POC: NONE SEEN
Sperm: NONE SEEN
Trich, Wet Prep: NONE SEEN
Yeast Wet Prep HPF POC: NONE SEEN

## 2020-02-25 LAB — URINALYSIS, ROUTINE W REFLEX MICROSCOPIC
Bilirubin Urine: NEGATIVE
Glucose, UA: 150 mg/dL — AB
Hgb urine dipstick: NEGATIVE
Ketones, ur: NEGATIVE mg/dL
Leukocytes,Ua: NEGATIVE
Nitrite: NEGATIVE
Protein, ur: 30 mg/dL — AB
Specific Gravity, Urine: 1.019 (ref 1.005–1.030)
pH: 6 (ref 5.0–8.0)

## 2020-02-25 MED ORDER — CYCLOBENZAPRINE HCL 5 MG PO TABS
10.0000 mg | ORAL_TABLET | Freq: Once | ORAL | Status: AC
  Administered 2020-02-25: 23:00:00 10 mg via ORAL
  Filled 2020-02-25: qty 2

## 2020-02-25 MED ORDER — CYCLOBENZAPRINE HCL 10 MG PO TABS: 10.0000 mg | ORAL_TABLET | Freq: Two times a day (BID) | ORAL | 0 refills | Status: DC | PRN

## 2020-02-25 MED ORDER — COMFORT FIT MATERNITY SUPP MED MISC: 0 refills | Status: DC

## 2020-02-25 NOTE — MAU Note (Signed)
Having a lot of lower abd pain since about 1800. Pain is worse with movement. Denies LOF or VB

## 2020-02-25 NOTE — MAU Provider Note (Signed)
History     CSN: [id number]  Arrival date and time: 02/25/20 2113   First Provider Initiated Contact with Patient 02/25/20 2226      Chief Complaint  Patient presents with  . Abdominal Pain   Joyce Solomon is a 34 y.o. [id number] at 42w2dwho receives care at [facility].  She presents today for Abdominal Pain.  She reports she started having pelvic pain around 1800 that is constant.  She reports taking tylenol around 1900 with no relief of symptoms.  Patient endorses fetal movement and denies abdominal cramping and contractions.  She denies vaginal concerns and endorses sexual activity two days ago.  Patient rates the pain a 10/10.     OB History    Gravida  6   Para  3   Term  3   Preterm      AB  2   Living  3     SAB  1   TAB  1   Ectopic      Multiple  0   Live Births  3           Past Medical History:  Diagnosis Date  . Allergy    SEASONAL  . Blood transfusion without reported diagnosis   . Headache   . Iron Deficiency Anemia 2008   s/p transfusions 2009, 2010; inconsistent compliance with PO iron.   . Non compliance w medication regimen 06/2015   stopped Remicade, may not have been taking Azulfidine.  .Marland KitchenUlcerative Colitis 2007   on Humira    Past Surgical History:  Procedure Laterality Date  . CESAREAN SECTION N/A 12/09/2013   Procedure: Primary Cesarean Section Delivery Baby Girl @ 1959, Apgars 8/8;  Surgeon: KLogan Bores MD;  [place];  Service: Obstetrics;  Laterality: N/A;  . CESAREAN SECTION N/A 08/14/2017   Procedure: CESAREAN SECTION;  Surgeon: RPaula Compton MD;  [place]  Service: Obstetrics;  Laterality: N/A;  . COLONOSCOPY  01/19/2011   Dr GPenelope Coop no active UC  . COLONOSCOPY W/ BIOPSIES N/A 04/2014   Dr KDeatra Ina Left sided colitis from prox descending to rectum; no inflammation at or proximal to splenic flexure  . DILATION AND EVACUATION  08/21/2012   Procedure: DILATATION AND EVACUATION;   Surgeon: KLogan Bores MD;  [place];  Service: Gynecology  . TONSILLECTOMY      Family History  Problem Relation Age of Onset  . Miscarriages / Stillbirths Other   . Diabetes Sister   . Hypotension Neg Hx   . Alcohol abuse Neg Hx   . Arthritis Neg Hx   . Asthma Neg Hx   . Birth defects Neg Hx   . COPD Neg Hx   . Depression Neg Hx   . Drug abuse Neg Hx   . Hearing loss Neg Hx   . Heart disease Neg Hx   . Hyperlipidemia Neg Hx   . Hypertension Neg Hx   . Kidney disease Neg Hx   . Learning disabilities Neg Hx   . Mental illness Neg Hx   . Mental retardation Neg Hx   . Colon cancer Neg Hx   . Colon polyps Neg Hx   . Esophageal cancer Neg Hx   . Stomach cancer Neg Hx   . Rectal cancer Neg Hx     Social History   Tobacco Use  . Smoking status: Former Smoker    Years: 3.00    Quit date: 12/13/2018  Years since quitting: 1.2  . Smokeless tobacco: Never Used  . Tobacco comment: smokes a cig "every blue moon"  Substance Use Topics  . Alcohol use: Not Currently    Alcohol/week: 0.0 standard drinks    Comment: occassional  . Drug use: No    Allergies:  Allergies  Allergen Reactions  . Latex Rash  . Remicade [Infliximab] Itching    Medications Prior to Admission  Medication Sig Dispense Refill Last Dose  . Blood Pressure Monitoring (BLOOD PRESSURE KIT) DEVI 1 Device by Does not apply route as needed. 1 Device 0 02/25/2020 at Unknown time  . Prenatal Vit w/Fe-Methylfol-FA (PNV PO) Take by mouth.   Past Week at Unknown time  . ondansetron (ZOFRAN ODT) 8 MG disintegrating tablet Take 1 tablet (8 mg total) by mouth every 8 (eight) hours as needed for nausea or vomiting. (Patient not taking: Reported on 09/09/2019) 60 tablet 0   . promethazine (PHENERGAN) 12.5 MG tablet Take 1 tablet (12.5 mg total) by mouth every 6 (six) hours as needed for nausea or vomiting. Take before sleeping. (Patient not taking: Reported on 09/09/2019) 30 tablet 0     Review of  Systems  Constitutional: Negative for chills and fever.  Respiratory: Negative for cough and shortness of breath.   Gastrointestinal: Negative for abdominal pain, constipation, diarrhea, nausea and vomiting.  Genitourinary: Positive for pelvic pain. Negative for difficulty urinating, dyspareunia, dysuria, vaginal bleeding and vaginal discharge.  Musculoskeletal: Negative for back pain.  Neurological: Negative for dizziness, light-headedness and headaches.   Physical Exam   Blood pressure (!) 118/53, pulse (!) 107, temperature 98 F (36.7 C), resp. rate 18, height 4' 11"  (1.499 m), weight 68.5 kg, last menstrual period 06/30/2019.  Physical Exam  Constitutional: She is oriented to person, place, and time. She appears well-developed and well-nourished. No distress.  Patient displays mild distress with movement.   HENT:  Head: Normocephalic and atraumatic.  Eyes: Conjunctivae are normal.  Cardiovascular: Normal rate.  Respiratory: Effort normal.  GI: Soft.  Genitourinary: Cervix exhibits no discharge.    Vaginal discharge and bleeding present.  There is bleeding in the vagina.    Genitourinary Comments: Speculum Exam: -Normal External Genitalia: Non tender, no apparent discharge at introitus.  -Vaginal Vault: Pink mucosa with good rugae. Scant amt thin white discharge -wet prep collected -Cervix:Pink, no lesions, cysts, or polyps.  Appears closed. No active bleeding from os-GC/CT collected -Bimanual Exam:  Closed/ Long/Thick    Musculoskeletal:     Cervical back: Normal range of motion.     Comments: Limited with position change.   Neurological: She is alert and oriented to person, place, and time.  Skin: Skin is warm and dry.  Psychiatric: She has a normal mood and affect. Her behavior is normal.      Fetal Assessment 135 bpm, Mod Var, -Decels, +Accels Toco: Irregular   MAU Course   Results for orders placed or performed during the hospital encounter of 02/25/20 (from the  past 24 hour(s))  Urinalysis, Routine w reflex microscopic     Status: Abnormal   Collection Time: 02/25/20  9:47 PM  Result Value Ref Range   Color, Urine YELLOW YELLOW   APPearance CLEAR CLEAR   Specific Gravity, Urine 1.019 1.005 - 1.030   pH 6.0 5.0 - 8.0   Glucose, UA 150 (A) NEGATIVE mg/dL   Hgb urine dipstick NEGATIVE NEGATIVE   Bilirubin Urine NEGATIVE NEGATIVE   Ketones, ur NEGATIVE NEGATIVE mg/dL   Protein, ur 30 (  A) NEGATIVE mg/dL   Nitrite NEGATIVE NEGATIVE   Leukocytes,Ua NEGATIVE NEGATIVE   RBC / HPF 0-5 0 - 5 RBC/hpf   WBC, UA 0-5 0 - 5 WBC/hpf   Bacteria, UA RARE (A) NONE SEEN   Squamous Epithelial / LPF 6-10 0 - 5   Mucus PRESENT   Wet prep, genital     Status: Abnormal   Collection Time: 02/25/20 10:38 PM  Result Value Ref Range   Yeast Wet Prep HPF POC NONE SEEN NONE SEEN   Trich, Wet Prep NONE SEEN NONE SEEN   Clue Cells Wet Prep HPF POC NONE SEEN NONE SEEN   WBC, Wet Prep HPF POC FEW (A) NONE SEEN   Sperm NONE SEEN    No results found.  MDM PE Labs: UA  Wet Prep, GC/CT EFM Muscle Relaxant Assessment and Plan  34 year old [id number]  SIUP at 34.2weeks Cat I FT Pelvic Pain  -POC reviewed. -Exam performed and findings discussed. -Cultures collected and pending.  -Encouraged usage of belly support band or abdominal binder for increased comfort throughout the day.  -Script printed and patient instructed to take to [place] for processing. -Will await results.   Maryann Conners MSN, CNM 02/25/2020, 10:26 PM   Reassessment (11:41 PM) -Nurse states patient pain 0/10. -Labs return normal -Rx for flexeril sent to pharmacy on file.  -Discussed usage of flexeril at bedtime and no subsequent tylenol pm usage.  -Educated that tylenol only is okay during the day. -Patient without questions or concerns. -Encouraged to call or return to MAU if symptoms worsen or with the onset of new symptoms. -Discharged to home in stable condition.  Maryann Conners  MSN, CNM Advanced Practice Provider, Center for [employer]

## 2020-02-25 NOTE — Discharge Instructions (Signed)

## 2020-02-26 LAB — GC/CHLAMYDIA PROBE AMP (~~LOC~~) NOT AT ARMC
Chlamydia: NEGATIVE
Comment: NEGATIVE
Comment: NORMAL
Neisseria Gonorrhea: NEGATIVE

## 2020-02-27 ENCOUNTER — Telehealth: Payer: Self-pay | Admitting: Gastroenterology

## 2020-02-27 NOTE — Telephone Encounter (Signed)
Patient agrees to come in to be seen 03/04/20 arrive at 10:30 am to see Dr Silverio Decamp.

## 2020-02-27 NOTE — Telephone Encounter (Signed)
Please schedule office visit next available soon either with me or APP. Thanks

## 2020-02-27 NOTE — Telephone Encounter (Signed)
Spoke with the patient.  She calls with report of ongoing "spots" of blood with her stools.Bowel movements are "a little of both" formed and soft. Complains of her low abdomen hurting sometimes. See her encounters for recent notes from OB. Asked her how long she has had these symptoms and she is unable to give any time stating " pretty much all along."  She is 34 weeks now.  She is not on any biologic. Tells me she has not been on anything but a stool softener since last seen by GI 09/08/2019. Please advise.

## 2020-03-02 ENCOUNTER — Encounter: Payer: Self-pay | Admitting: Obstetrics & Gynecology

## 2020-03-02 ENCOUNTER — Telehealth (INDEPENDENT_AMBULATORY_CARE_PROVIDER_SITE_OTHER): Payer: Medicaid Other | Admitting: Obstetrics & Gynecology

## 2020-03-02 ENCOUNTER — Telehealth: Payer: Medicaid Other | Admitting: Obstetrics

## 2020-03-02 DIAGNOSIS — O09293 Supervision of pregnancy with other poor reproductive or obstetric history, third trimester: Secondary | ICD-10-CM

## 2020-03-02 DIAGNOSIS — O09299 Supervision of pregnancy with other poor reproductive or obstetric history, unspecified trimester: Secondary | ICD-10-CM

## 2020-03-02 DIAGNOSIS — O34219 Maternal care for unspecified type scar from previous cesarean delivery: Secondary | ICD-10-CM

## 2020-03-02 DIAGNOSIS — Z3A35 35 weeks gestation of pregnancy: Secondary | ICD-10-CM

## 2020-03-02 NOTE — Progress Notes (Signed)
Virtual ROB  No Complaints.   Pt states B/P Cuff is not working.

## 2020-03-02 NOTE — Progress Notes (Addendum)
   TELEHEALTH VIRTUAL OBSTETRICS VISIT ENCOUNTER NOTE Telehealth Location: [facility]  I connected with Joyce Solomon on 03/02/20 at  2:00 PM EDT by telephone at home and verified that I am speaking with the correct person using two identifiers.   I discussed the limitations, risks, security and privacy concerns of performing an evaluation and management service by telephone and the availability of in person appointments. I also discussed with the patient that there may be a patient responsible charge related to this service. The patient expressed understanding and agreed to proceed.  Subjective:  Joyce Solomon is a 34 y.o. [phone] at 87w1dbeing followed for ongoing prenatal care.  She is currently monitored for the following issues for this low-risk pregnancy and has Thyrotoxicosis; Anemia; DENTAL CARIES; Ulcerative colitis ([place]; Constipation; ACNE, MILD; Prior pregnancy complicated by SGA (small for gestational age), antepartum; History of cesarean delivery affecting pregnancy; Rectal bleeding; Anal fissure; and Supervision of high risk pregnancy, antepartum on their problem list.  Patient reports no complaints. Reports fetal movement. Denies any contractions, bleeding or leaking of fluid.   The following portions of the patient's history were reviewed and updated as appropriate: allergies, current medications, past family history, past medical history, past social history, past surgical history and problem list.   Objective:   General:  Alert, oriented and cooperative.   Mental Status: Normal mood and affect perceived. Normal judgment and thought content.  Rest of physical exam deferred due to type of encounter  Assessment and Plan:  Pregnancy: [id number] 333w1d. History of cesarean delivery affecting pregnancy ERCS scheduled for May.   2. Prior pregnancy complicated by SGA (small for gestational age), antepartum Mother doing well. No complaints. C/S scheduled in May,  considering LARCS s  Term labor symptoms and general obstetric precautions including but not limited to vaginal bleeding, contractions, leaking of fluid and fetal movement were reviewed in detail with the patient.  I discussed the assessment and treatment plan with the patient. The patient was provided an opportunity to ask questions and all were answered. The patient agreed with the plan and demonstrated an understanding of the instructions. The patient was advised to call back or seek an in-person office evaluation/[facility] or concerning [facility]. Please refer to After Visit Summary for other counseling recommendations.   I provided 10 minutes of non-face-to-face time during this encounter.  No follow-ups on file.  Future Appointments  Date Time Provider [place]4/15/2021 10:40 AM NaMauri PoleMD [facility]4/20/2021  3:40 PM BuVirginia RochesterNP [facility] None  03/12/2020  2:00 PM WHBull ValleyURSE WHQueensFC-US  03/12/2020  2:00 PM WH-MFC [place] WH-MFCUS MFC-US    Anastasiya Gowin T Euriah Matlack, MDPittsboroor [facility]

## 2020-03-04 ENCOUNTER — Encounter: Payer: Self-pay | Admitting: Gastroenterology

## 2020-03-04 ENCOUNTER — Ambulatory Visit (INDEPENDENT_AMBULATORY_CARE_PROVIDER_SITE_OTHER): Payer: Medicaid Other | Admitting: Gastroenterology

## 2020-03-04 ENCOUNTER — Other Ambulatory Visit (INDEPENDENT_AMBULATORY_CARE_PROVIDER_SITE_OTHER): Payer: Medicaid Other

## 2020-03-04 VITALS — BP 124/64 | HR 106 | Temp 96.8°F | Ht 59.0 in | Wt 152.2 lb

## 2020-03-04 DIAGNOSIS — R197 Diarrhea, unspecified: Secondary | ICD-10-CM

## 2020-03-04 DIAGNOSIS — K51311 Ulcerative (chronic) rectosigmoiditis with rectal bleeding: Secondary | ICD-10-CM | POA: Diagnosis not present

## 2020-03-04 LAB — COMPREHENSIVE METABOLIC PANEL
ALT: 5 U/L (ref 0–35)
AST: 9 U/L (ref 0–37)
Albumin: 3.6 g/dL (ref 3.5–5.2)
Alkaline Phosphatase: 112 U/L (ref 39–117)
BUN: 6 mg/dL (ref 6–23)
CO2: 25 mEq/L (ref 19–32)
Calcium: 8.9 mg/dL (ref 8.4–10.5)
Chloride: 104 mEq/L (ref 96–112)
Creatinine, Ser: 0.49 mg/dL (ref 0.40–1.20)
GFR: 174.62 mL/min (ref >60.00)
Glucose, Bld: 89 mg/dL (ref 70–99)
Potassium: 3.7 mEq/L (ref 3.5–5.1)
Sodium: 136 mEq/L (ref 135–145)
Total Bilirubin: 0.2 mg/dL (ref 0.2–1.2)
Total Protein: 7 g/dL (ref 6.0–8.3)

## 2020-03-04 LAB — CBC WITH DIFFERENTIAL/PLATELET
Basophils Absolute: 0 10*3/uL (ref 0.0–0.1)
Basophils Relative: 0.2 % (ref 0.0–3.0)
Eosinophils Absolute: 0.1 10*3/uL (ref 0.0–0.7)
Eosinophils Relative: 0.8 % (ref 0.0–5.0)
HCT: 36 % (ref 36.0–46.0)
Hemoglobin: 12 g/dL (ref 12.0–15.0)
Lymphocytes Relative: 23.1 % (ref 12.0–46.0)
Lymphs Abs: 2.4 10*3/uL (ref 0.7–4.0)
MCHC: 33.5 g/dL (ref 30.0–36.0)
MCV: 88.4 fl (ref 78.0–100.0)
Monocytes Absolute: 1.3 10*3/uL — ABNORMAL HIGH (ref 0.1–1.0)
Monocytes Relative: 12.6 % — ABNORMAL HIGH (ref 3.0–12.0)
Neutro Abs: 6.6 10*3/uL (ref 1.4–7.7)
Neutrophils Relative %: 63.3 % (ref 43.0–77.0)
Platelets: 280 10*3/uL (ref 150.0–400.0)
RBC: 4.07 Mil/uL (ref 3.87–5.11)
RDW: 14.2 % (ref 11.5–15.5)
WBC: 10.4 10*3/uL (ref 4.0–10.5)

## 2020-03-04 LAB — SEDIMENTATION RATE: Sed Rate: 87 mm/hr — ABNORMAL HIGH (ref 0–20)

## 2020-03-04 MED ORDER — PREDNISONE 5 MG PO TABS: ORAL_TABLET | ORAL | 0 refills | Status: DC

## 2020-03-04 NOTE — Progress Notes (Signed)
Joyce Solomon    [id number]    09/30/1986  Primary Care Physician:Pa, [facility]  Referring Physician: Pa, [facility] [address] [place],  [place] [address]   Chief complaint: Ulcerative colitis HPI:  34 year old female with chronic history of ulcerative colitis, [redacted] weeks pregnant here with complaints of UC flare  She was in clinical remission on Humira, self discontinued after she became pregnant, restarted in Nov 2020 and she stopped it again 1-2 months ago as she thought it was not helping  She is having multiple BM per day with liquid stool and also tenesmus.  She is also having intermittent rectal bleeding with lower abdominal cramping. Her symptoms are similar to when she was initially diagnosed with ulcerative colitis.  Denies any fever or chills.  No sick contacts.  Relevant GI history: Colonoscopy June 2015 showed moderately severe left-sided colitis  Allergic reaction to Remicade in 2016. Elevated antibody level to infliximab 1622 and undetectable drug level  Flexible sigmoidoscopy January 2017 showed severe colitis left colon with ulceration, biopsies showed moderately active colitis.  Started on Humira March 2017, and clinical remission since then  Colonoscopy Apr 10, 2018 negative for active colitis, chronic mucosal changes with altered vascularity and mild erythema. Random biopsies negative for active inflammation.  Drug trough 5.9 with undetectable antibody for Humira    Outpatient Encounter Medications as of 03/04/2020  Medication Sig  . Blood Pressure Monitoring (BLOOD PRESSURE KIT) DEVI 1 Device by Does not apply route as needed.  . cyclobenzaprine (FLEXERIL) 10 MG tablet Take 1 tablet (10 mg total) by mouth 2 (two) times daily as needed for muscle spasms.  . Elastic Bandages & Supports (COMFORT FIT MATERNITY SUPP MED) MISC Wear belt during the day, as needed, and remove at night prior to bedtime.  . Prenatal Vit  w/Fe-Methylfol-FA (PNV PO) Take 1 tablet by mouth daily.   . [DISCONTINUED] HUMIRA PEN 40 MG/0.4ML PNKT INJECT 40 MG INTO THE SKIN EVERY 14 (FOURTEEN) DAYS.   No facility-administered encounter medications on file as of 03/04/2020.    Allergies as of 03/04/2020 - Review Complete 03/02/2020  Allergen Reaction Noted  . Latex Rash 05/20/2009  . Remicade [infliximab] Itching 08/07/2015    Past Medical History:  Diagnosis Date  . Allergy    SEASONAL  . Blood transfusion without reported diagnosis   . Headache   . Iron Deficiency Anemia 2008   s/p transfusions 2009, 2010; inconsistent compliance with PO iron.   . Non compliance w medication regimen 06/2015   stopped Remicade, may not have been taking Azulfidine.  [name] Ulcerative Colitis 2007   on Humira    Past Surgical History:  Procedure Laterality Date  . CESAREAN SECTION N/A 12/09/2013   Procedure: Primary Cesarean Section Delivery Baby Girl @ 1959, Apgars 8/8;  Surgeon: Logan Bores, MD;  Location: [facility];  Service: Obstetrics;  Laterality: N/A;  . CESAREAN SECTION N/A 08/14/2017   Procedure: CESAREAN SECTION;  Surgeon: Paula Compton, MD;  Location: [facility];  Service: Obstetrics;  Laterality: N/A;  . COLONOSCOPY  01/19/2011   Dr Penelope Coop. no active UC  . COLONOSCOPY W/ BIOPSIES N/A 04/2014   Dr Deatra Ina: Left sided colitis from prox descending to rectum; no inflammation at or proximal to splenic flexure  . DILATION AND EVACUATION  08/21/2012   Procedure: DILATATION AND EVACUATION;  Surgeon: Logan Bores, MD;  Location: [facility];  Service: Gynecology  . TONSILLECTOMY  Family History  Problem Relation Age of Onset  . Miscarriages / Stillbirths Other   . Diabetes Sister   . Hypotension Neg Hx   . Alcohol abuse Neg Hx   . Arthritis Neg Hx   . Asthma Neg Hx   . Birth defects Neg Hx   . COPD Neg Hx   . Depression Neg Hx   . Drug abuse Neg Hx   . Hearing loss Neg Hx   . Heart disease Neg Hx   .  Hyperlipidemia Neg Hx   . Hypertension Neg Hx   . Kidney disease Neg Hx   . Learning disabilities Neg Hx   . Mental illness Neg Hx   . Mental retardation Neg Hx   . Colon cancer Neg Hx   . Colon polyps Neg Hx   . Esophageal cancer Neg Hx   . Stomach cancer Neg Hx   . Rectal cancer Neg Hx     Social History   Socioeconomic History  . Marital status: Single    Spouse name: Not on file  . Number of children: 2  . Years of education: Not on file  . Highest education level: Not on file  Occupational History  . Occupation: unemployed  Tobacco Use  . Smoking status: Former Smoker    Years: 3.00    Quit date: 12/13/2018    Years since quitting: 1.2  . Smokeless tobacco: Never Used  . Tobacco comment: smokes a cig "every blue moon"  Substance and Sexual Activity  . Alcohol use: Not Currently    Alcohol/week: 0.0 standard drinks    Comment: occassional  . Drug use: No  . Sexual activity: Yes    Birth control/protection: None  Other Topics Concern  . Not on file  Social History Narrative  . Not on file   Social Determinants of Health   Financial Resource Strain:   . Difficulty of Paying Living Expenses:   Food Insecurity:   . Worried About Charity fundraiser in the Last Year:   . Arboriculturist in the Last Year:   Transportation Needs:   . Film/video editor (Medical):   [name] Lack of Transportation (Non-Medical):   Physical Activity:   . Days of Exercise per Week:   . Minutes of Exercise per Session:   Stress:   . Feeling of Stress :   Social Connections:   . Frequency of Communication with Friends and Family:   . Frequency of Social Gatherings with Friends and Family:   . Attends Religious Services:   . Active Member of Clubs or Organizations:   . Attends Archivist Meetings:   [name] Marital Status:   Intimate Partner Violence:   . Fear of Current or Ex-Partner:   . Emotionally Abused:   [name] Physically Abused:   . Sexually Abused:       Review of  systems: All other review of systems negative except as mentioned in the HPI.   Physical Exam: Vitals:   03/04/20 1038  BP: 124/64  Pulse: (!) 106  Temp: (!) 96.8 F (36 C)  SpO2: 98%   Body mass index is 30.75 kg/m. Gen:      No acute distress Abd:      Gravid uterus, non-tender;  Ext:    No edema Skin:      Warm and dry; no rash Neuro: alert and oriented x 3 Psych: normal mood and affect  Data Reviewed:  Reviewed labs, radiology imaging, old  records and pertinent past GI work up   Assessment and Plan/Recommendations:  34 year old female, pregnant [redacted] weeks gestation with history of left-sided ulcerative colitis, was on chronic maintenance therapy with Humira but has been off it for the past 1 to 2 months with acute diarrhea and rectal bleeding suggestive of ulcerative colitis flare  Check GI pathogen panel to exclude C. Difficile/ infectious etiology for UC flare  CBC, CMP, ESR  Start prednisone taper 20 mg daily X 2 weeks followed by 5 mg every 2 weeks until she reaches 5 mg daily  Called her OB/GYN and discussed with her while patient was in the office, she is considered low risk to start prednisone and no contraindication given she is 35 weeks into her pregnancy  She is scheduled for C-section on May 10  Return in 2 months or sooner if needed   This visit required 45 minutes of patient care (this includes precharting, chart review, review of results, face-to-face time used for counseling as well as treatment plan and follow-up. The patient was provided an opportunity to ask questions and all were answered. The patient agreed with the plan and demonstrated an understanding of the instructions.  Damaris Hippo , MD    CC: Pa, [facility]

## 2020-03-04 NOTE — Patient Instructions (Signed)
If you are age 35 or older, your body mass index should be between 23-30. Your Body mass index is 30.75 kg/m. If this is out of the aforementioned range listed, please consider follow up with your Primary Care Provider.  If you are age 62 or younger, your body mass index should be between 19-25. Your Body mass index is 30.75 kg/m. If this is out of the aformentioned range listed, please consider follow up with your Primary Care Provider.   We have sent the following medications to your pharmacy for you to pick up at your convenience: Prednisone - follow taper instructions on rx.   Your provider has requested that you go to the basement level for lab work before leaving today. Press "B" on the elevator. The lab is located at the first door on the left as you exit the elevator.  Due to recent changes in healthcare laws, you may see the results of your imaging and laboratory studies on MyChart before your provider has had a chance to review them.  We understand that in some cases there may be results that are confusing or concerning to you. Not all laboratory results come back in the same time frame and the provider may be waiting for multiple results in order to interpret others.  Please give [place] 48 hours in order for your provider to thoroughly review all the results before contacting the office for clarification of your results.   Thank you for choosing me and [facility].  Dr. Silverio Decamp

## 2020-03-05 ENCOUNTER — Encounter: Payer: Self-pay | Admitting: Gastroenterology

## 2020-03-09 ENCOUNTER — Other Ambulatory Visit: Payer: Medicaid Other

## 2020-03-09 ENCOUNTER — Ambulatory Visit (INDEPENDENT_AMBULATORY_CARE_PROVIDER_SITE_OTHER): Payer: Medicaid Other | Admitting: Nurse Practitioner

## 2020-03-09 ENCOUNTER — Other Ambulatory Visit: Payer: Self-pay

## 2020-03-09 VITALS — BP 107/73 | HR 102 | Temp 98.3°F | Wt 153.0 lb

## 2020-03-09 DIAGNOSIS — O34219 Maternal care for unspecified type scar from previous cesarean delivery: Secondary | ICD-10-CM

## 2020-03-09 DIAGNOSIS — Z3A36 36 weeks gestation of pregnancy: Secondary | ICD-10-CM

## 2020-03-09 DIAGNOSIS — O99613 Diseases of the digestive system complicating pregnancy, third trimester: Secondary | ICD-10-CM

## 2020-03-09 DIAGNOSIS — O09299 Supervision of pregnancy with other poor reproductive or obstetric history, unspecified trimester: Secondary | ICD-10-CM

## 2020-03-09 DIAGNOSIS — R197 Diarrhea, unspecified: Secondary | ICD-10-CM

## 2020-03-09 DIAGNOSIS — O09293 Supervision of pregnancy with other poor reproductive or obstetric history, third trimester: Secondary | ICD-10-CM

## 2020-03-09 DIAGNOSIS — O0990 Supervision of high risk pregnancy, unspecified, unspecified trimester: Secondary | ICD-10-CM

## 2020-03-09 DIAGNOSIS — K51311 Ulcerative (chronic) rectosigmoiditis with rectal bleeding: Secondary | ICD-10-CM

## 2020-03-09 NOTE — Progress Notes (Signed)
    Subjective:  Joyce Solomon is a 34 y.o. [phone] at 52w1dbeing seen today for ongoing prenatal care.  She is currently monitored for the following issues for this high-risk pregnancy and has Thyrotoxicosis; Anemia; DENTAL CARIES; Ulcerative colitis ([place]; Constipation; ACNE, MILD; Prior pregnancy complicated by SGA (small for gestational age), antepartum; History of cesarean delivery affecting pregnancy; Rectal bleeding; Anal fissure; and Supervision of high risk pregnancy, antepartum on their problem list.  Patient reports no complaints.  Contractions: Irregular. Vag. Bleeding: None.  Movement: Present. Denies leaking of fluid.   The following portions of the patient's history were reviewed and updated as appropriate: allergies, current medications, past family history, past medical history, past social history, past surgical history and problem list. Problem list updated.  Objective:   Vitals:   03/09/20 1546  BP: 107/73  Pulse: (!) 102  Temp: 98.3 F (36.8 C)  Weight: 153 lb (69.4 kg)    Fetal Status: Fetal Heart Rate (bpm): 145 Fundal Height: 33 cm Movement: Present     General:  Alert, oriented and cooperative. Patient is in no acute distress.  Skin: Skin is warm and dry. No rash noted.   Cardiovascular: Normal heart rate noted  Respiratory: Normal respiratory effort, no problems with respiration noted  Abdomen: Soft, gravid, appropriate for gestational age. Pain/Pressure: Present     Pelvic:  Cervical exam deferred        GBS swab done  Extremities: Normal range of motion.  Edema: Trace  Mental Status: Normal mood and affect. Normal behavior. Normal judgment and thought content.   Urinalysis:      Assessment and Plan:  Pregnancy: [id number] 362w1d1. Supervision of high risk pregnancy, antepartum Had GC/Chlam done 2 weeks ago and reports no intercourse since that testing BP cuff is not working Advised to bring cuff to next visit - scheduled as in person due to  cuff not working  - Strep Gp B NAA  2. History of cesarean delivery affecting pregnancy Scheduled for C/S  3. Ulcerative rectosigmoiditis with rectal bleeding (HCC) Saw GI for ulcerative colitis flare - prescribed prednisone taper Has noticed decrease in her symptoms since starting the prednisone  4. Prior pregnancy complicated by SGA (small for gestational age), antepartum Scheduled for USKorean 03-12-20 Measures slightly smaller than expected at this gestational age  Preterm labor symptoms and general obstetric precautions including but not limited to vaginal bleeding, contractions, leaking of fluid and fetal movement were reviewed in detail with the patient. Please refer to After Visit Summary for other counseling recommendations.  Return in about 1 week (around 03/16/2020) for in person ROB - to bring BP cuff.  TEEarlie ServerRN, MSN, NP-BC Nurse Practitioner, [facility] WoDe[facility] CompanyCoPortola Valleyroup 03/09/2020 4:18 PM

## 2020-03-09 NOTE — Progress Notes (Signed)
ROB/GBS. Reports no problems today.

## 2020-03-11 LAB — GI PROFILE, STOOL, PCR

## 2020-03-11 LAB — STREP GP B NAA: Strep Gp B NAA: POSITIVE — AB

## 2020-03-12 ENCOUNTER — Other Ambulatory Visit: Payer: Self-pay

## 2020-03-12 ENCOUNTER — Ambulatory Visit (HOSPITAL_COMMUNITY)
Admission: RE | Admit: 2020-03-12 | Discharge: 2020-03-12 | Disposition: A | Discharge status: Home / Self Care | Payer: Medicaid Other | Source: Ambulatory Visit | Attending: Obstetrics and Gynecology | Admitting: Obstetrics and Gynecology

## 2020-03-12 ENCOUNTER — Ambulatory Visit (HOSPITAL_COMMUNITY): Payer: Medicaid Other | Admitting: *Deleted

## 2020-03-12 ENCOUNTER — Encounter (HOSPITAL_COMMUNITY): Payer: Self-pay

## 2020-03-12 DIAGNOSIS — O09299 Supervision of pregnancy with other poor reproductive or obstetric history, unspecified trimester: Secondary | ICD-10-CM | POA: Diagnosis present

## 2020-03-12 DIAGNOSIS — O34219 Maternal care for unspecified type scar from previous cesarean delivery: Secondary | ICD-10-CM | POA: Diagnosis present

## 2020-03-12 DIAGNOSIS — O0933 Supervision of pregnancy with insufficient antenatal care, third trimester: Secondary | ICD-10-CM | POA: Diagnosis not present

## 2020-03-12 DIAGNOSIS — Z363 Encounter for antenatal screening for malformations: Secondary | ICD-10-CM

## 2020-03-12 DIAGNOSIS — O0930 Supervision of pregnancy with insufficient antenatal care, unspecified trimester: Secondary | ICD-10-CM | POA: Diagnosis not present

## 2020-03-12 DIAGNOSIS — O2693 Pregnancy related conditions, unspecified, third trimester: Secondary | ICD-10-CM

## 2020-03-12 DIAGNOSIS — Z3A36 36 weeks gestation of pregnancy: Secondary | ICD-10-CM

## 2020-03-12 DIAGNOSIS — O09293 Supervision of pregnancy with other poor reproductive or obstetric history, third trimester: Secondary | ICD-10-CM | POA: Diagnosis not present

## 2020-03-12 DIAGNOSIS — Z362 Encounter for other antenatal screening follow-up: Secondary | ICD-10-CM

## 2020-03-12 MED ORDER — VANCOMYCIN HCL 125 MG PO CAPS: 125.0000 mg | ORAL_CAPSULE | Freq: Four times a day (QID) | ORAL | 0 refills | Status: AC

## 2020-03-14 ENCOUNTER — Encounter: Payer: Self-pay | Admitting: Nurse Practitioner

## 2020-03-14 DIAGNOSIS — O99820 Streptococcus B carrier state complicating pregnancy: Secondary | ICD-10-CM | POA: Insufficient documentation

## 2020-03-16 ENCOUNTER — Encounter: Payer: Self-pay | Admitting: Obstetrics and Gynecology

## 2020-03-16 ENCOUNTER — Ambulatory Visit (INDEPENDENT_AMBULATORY_CARE_PROVIDER_SITE_OTHER): Payer: Medicaid Other | Admitting: Obstetrics and Gynecology

## 2020-03-16 ENCOUNTER — Other Ambulatory Visit: Payer: Self-pay

## 2020-03-16 VITALS — BP 118/73 | HR 100 | Wt 153.2 lb

## 2020-03-16 DIAGNOSIS — K51311 Ulcerative (chronic) rectosigmoiditis with rectal bleeding: Secondary | ICD-10-CM

## 2020-03-16 DIAGNOSIS — O0990 Supervision of high risk pregnancy, unspecified, unspecified trimester: Secondary | ICD-10-CM

## 2020-03-16 DIAGNOSIS — O99820 Streptococcus B carrier state complicating pregnancy: Secondary | ICD-10-CM

## 2020-03-16 DIAGNOSIS — O34219 Maternal care for unspecified type scar from previous cesarean delivery: Secondary | ICD-10-CM

## 2020-03-16 NOTE — Progress Notes (Signed)
Pt is here for ROB, [redacted]w[redacted]d

## 2020-03-16 NOTE — Progress Notes (Signed)
   PRENATAL VISIT NOTE  Subjective:  Joyce Solomon is a 34 y.o. [phone] at 56w1dbeing seen today for ongoing prenatal care.  She is currently monitored for the following issues for this high-risk pregnancy and has Thyrotoxicosis; Anemia; DENTAL CARIES; Ulcerative colitis ([place]; Constipation; ACNE, MILD; Prior pregnancy complicated by SGA (small for gestational age), antepartum; History of cesarean delivery affecting pregnancy; Rectal bleeding; Anal fissure; Supervision of high risk pregnancy, antepartum; and Group B Streptococcus carrier, +RV culture, currently pregnant on their problem list.  Patient reports no complaints.  Contractions: Not present. Vag. Bleeding: None.  Movement: Present. Denies leaking of fluid.   The following portions of the patient's history were reviewed and updated as appropriate: allergies, current medications, past family history, past medical history, past social history, past surgical history and problem list.   Objective:   Vitals:   03/16/20 1420  BP: 118/73  Pulse: 100  Weight: 153 lb 3.2 oz (69.5 kg)    Fetal Status: Fetal Heart Rate (bpm): 145 Fundal Height: 37 cm Movement: Present     General:  Alert, oriented and cooperative. Patient is in no acute distress.  Skin: Skin is warm and dry. No rash noted.   Cardiovascular: Normal heart rate noted  Respiratory: Normal respiratory effort, no problems with respiration noted  Abdomen: Soft, gravid, appropriate for gestational age.  Pain/Pressure: Present     Pelvic: Cervical exam performed in the presence of a chaperone Dilation: Closed Effacement (%): Thick Station: Ballotable  Extremities: Normal range of motion.  Edema: None  Mental Status: Normal mood and affect. Normal behavior. Normal judgment and thought content.   Assessment and Plan:  Pregnancy: [id number] 316w1d. Supervision of high risk pregnancy, antepartum Patient is doing well without complaints Scheduled for repeat c-section on  5/10  2. Group B Streptococcus carrier, +RV culture, currently pregnant   3. History of cesarean delivery affecting pregnancy Scheduled for repeat with Nexplanon  4. Ulcerative rectosigmoiditis with rectal bleeding (HCC) Continue steroid taper  Term labor symptoms and general obstetric precautions including but not limited to vaginal bleeding, contractions, leaking of fluid and fetal movement were reviewed in detail with the patient. Please refer to After Visit Summary for other counseling recommendations.   Return in about 1 week (around 03/23/2020) for in person, ROB, High risk.  No future appointments.  PeMora BellmanMD

## 2020-03-18 ENCOUNTER — Inpatient Hospital Stay (HOSPITAL_COMMUNITY)
Admission: AD | Admit: 2020-03-18 | Discharge: 2020-03-18 | Disposition: A | Discharge status: Home / Self Care | Payer: Medicaid Other | Attending: Obstetrics and Gynecology | Admitting: Obstetrics and Gynecology

## 2020-03-18 ENCOUNTER — Other Ambulatory Visit: Payer: Self-pay

## 2020-03-18 ENCOUNTER — Encounter (HOSPITAL_COMMUNITY): Payer: Self-pay | Admitting: Obstetrics and Gynecology

## 2020-03-18 DIAGNOSIS — O471 False labor at or after 37 completed weeks of gestation: Secondary | ICD-10-CM | POA: Insufficient documentation

## 2020-03-18 DIAGNOSIS — O479 False labor, unspecified: Secondary | ICD-10-CM

## 2020-03-18 DIAGNOSIS — Z3A37 37 weeks gestation of pregnancy: Secondary | ICD-10-CM | POA: Diagnosis not present

## 2020-03-18 DIAGNOSIS — O09299 Supervision of pregnancy with other poor reproductive or obstetric history, unspecified trimester: Secondary | ICD-10-CM

## 2020-03-18 DIAGNOSIS — O34219 Maternal care for unspecified type scar from previous cesarean delivery: Secondary | ICD-10-CM

## 2020-03-18 DIAGNOSIS — Z3689 Encounter for other specified antenatal screening: Secondary | ICD-10-CM

## 2020-03-18 HISTORY — DX: Thyrotoxicosis, unspecified without thyrotoxic crisis or storm: E05.90

## 2020-03-18 NOTE — MAU Provider Note (Signed)
S: Joyce Solomon is a 34 y.o. [id number] at [redacted]w[redacted]d who presents to MAU today for labor evaluation.     Cervical exam by RN:  Dilation: Closed Effacement (%): Thick Station: -3 Exam by:: JBlima SingerRNC  Fetal Monitoring: Baseline: 140 Variability: average Accelerations: present Decelerations: absent Contractions: q 15 min  MDM Discussed patient with RN. NST reviewed.   A: SIUP at 346w3dFalse labor  P: Discharge home Labor precautions and kick counts included in AVS Patient to follow-up with office as scheduled  Patient may return to MAU as needed or when in labor   WiKennis Carina/29/2021 10:29 PM

## 2020-03-18 NOTE — Discharge Instructions (Signed)
Braxton Hicks Contractions Contractions of the uterus can occur throughout pregnancy, but they are not always a sign that you are in labor. You may have practice contractions called Braxton Hicks contractions. These false labor contractions are sometimes confused with true labor. What are [place] contractions? Braxton Hicks contractions are tightening movements that occur in the muscles of the uterus before labor. Unlike true labor contractions, these contractions do not result in opening (dilation) and thinning of the cervix. Toward the end of pregnancy (32-34 weeks), Braxton Hicks contractions can happen more often and may become stronger. These contractions are sometimes difficult to tell apart from true labor because they can be very uncomfortable. You should not feel embarrassed if you go to the hospital with false labor. Sometimes, the only way to tell if you are in true labor is for your health care provider to look for changes in the cervix. The health care provider will do a physical exam and may monitor your contractions. If you are not in true labor, the exam should show that your cervix is not dilating and your water has not broken. If there are no other health problems associated with your pregnancy, it is completely safe for you to be sent home with false labor. You may continue to have Braxton Hicks contractions until you go into true labor. How to tell the difference between true labor and false labor True labor  Contractions last 30-70 seconds.  Contractions become very regular.  Discomfort is usually felt in the top of the uterus, and it spreads to the lower abdomen and low back.  Contractions do not go away with walking.  Contractions usually become more intense and increase in frequency.  The cervix dilates and gets thinner. False labor  Contractions are usually shorter and not as strong as true labor contractions.  Contractions are usually irregular.  Contractions  are often felt in the front of the lower abdomen and in the groin.  Contractions may go away when you walk around or change positions while lying down.  Contractions get weaker and are shorter-lasting as time goes on.  The cervix usually does not dilate or become thin. Follow these instructions at home:   Take over-the-counter and prescription medicines only as told by your health care provider.  Keep up with your usual exercises and follow other instructions from your health care provider.  Eat and drink lightly if you think you are going into labor.  If Braxton Hicks contractions are making you uncomfortable: ? Change your position from lying down or resting to walking, or change from walking to resting. ? Sit and rest in a tub of warm water. ? Drink enough fluid to keep your urine pale yellow. Dehydration may cause these contractions. ? Do slow and deep breathing several times an hour.  Keep all follow-up prenatal visits as told by your health care provider. This is important. Contact a health care provider if:  You have a fever.  You have continuous pain in your abdomen. Get help right away if:  Your contractions become stronger, more regular, and closer together.  You have fluid leaking or gushing from your vagina.  You pass blood-tinged mucus (bloody show).  You have bleeding from your vagina.  You have low back pain that you never had before.  You feel your babys head pushing down and causing pelvic pressure.  Your baby is not moving inside you as much as it used to. Summary  Contractions that occur before labor are  called Braxton Hicks contractions, false labor, or practice contractions.  Braxton Hicks contractions are usually shorter, weaker, farther apart, and less regular than true labor contractions. True labor contractions usually become progressively stronger and regular, and they become more frequent.  Manage discomfort from [facility] contractions  by changing position, resting in a warm bath, drinking plenty of water, or practicing deep breathing. This information is not intended to replace advice given to you by your health care provider. Make sure you discuss any questions you have with your health care provider. Document Revised: 10/19/2017 Document Reviewed: 03/22/2017 Elsevier Patient Education  [place].

## 2020-03-18 NOTE — Progress Notes (Signed)
WRitten and verbal d/c instructions given and understanding voiced.

## 2020-03-18 NOTE — MAU Note (Addendum)
Irreg ctxs since last night. Unsure if Braxton hicks. Denies LOF or VB. For repeat c/s May 10

## 2020-03-23 ENCOUNTER — Inpatient Hospital Stay (EMERGENCY_DEPARTMENT_HOSPITAL)
Admission: AD | Admit: 2020-03-23 | Discharge: 2020-03-23 | Disposition: A | Discharge status: Home / Self Care | Payer: Medicaid Other | Source: Home / Self Care | Attending: Obstetrics and Gynecology | Admitting: Obstetrics and Gynecology

## 2020-03-23 ENCOUNTER — Inpatient Hospital Stay (HOSPITAL_COMMUNITY): Payer: Medicaid Other | Admitting: Certified Registered"

## 2020-03-23 ENCOUNTER — Encounter (HOSPITAL_COMMUNITY): Payer: Self-pay | Admitting: Obstetrics & Gynecology

## 2020-03-23 ENCOUNTER — Encounter: Payer: Medicaid Other | Admitting: Obstetrics & Gynecology

## 2020-03-23 ENCOUNTER — Inpatient Hospital Stay (HOSPITAL_COMMUNITY)
Admission: AD | Admit: 2020-03-23 | Discharge: 2020-03-25 | DRG: 787 | Disposition: A | Discharge status: Home / Self Care | Payer: Medicaid Other | Attending: Obstetrics & Gynecology | Admitting: Obstetrics & Gynecology

## 2020-03-23 ENCOUNTER — Other Ambulatory Visit: Payer: Self-pay

## 2020-03-23 ENCOUNTER — Encounter (HOSPITAL_COMMUNITY): Payer: Self-pay | Admitting: Obstetrics and Gynecology

## 2020-03-23 ENCOUNTER — Encounter (HOSPITAL_COMMUNITY)
Admission: AD | Disposition: A | Discharge status: Home / Self Care | Payer: Self-pay | Source: Home / Self Care | Attending: Obstetrics & Gynecology

## 2020-03-23 DIAGNOSIS — O09299 Supervision of pregnancy with other poor reproductive or obstetric history, unspecified trimester: Secondary | ICD-10-CM

## 2020-03-23 DIAGNOSIS — O99824 Streptococcus B carrier state complicating childbirth: Secondary | ICD-10-CM | POA: Diagnosis present

## 2020-03-23 DIAGNOSIS — O4703 False labor before 37 completed weeks of gestation, third trimester: Secondary | ICD-10-CM

## 2020-03-23 DIAGNOSIS — N858 Other specified noninflammatory disorders of uterus: Secondary | ICD-10-CM | POA: Diagnosis not present

## 2020-03-23 DIAGNOSIS — O34219 Maternal care for unspecified type scar from previous cesarean delivery: Secondary | ICD-10-CM | POA: Diagnosis present

## 2020-03-23 DIAGNOSIS — O0990 Supervision of high risk pregnancy, unspecified, unspecified trimester: Secondary | ICD-10-CM

## 2020-03-23 DIAGNOSIS — Z91040 Latex allergy status: Secondary | ICD-10-CM

## 2020-03-23 DIAGNOSIS — Z87891 Personal history of nicotine dependence: Secondary | ICD-10-CM

## 2020-03-23 DIAGNOSIS — Z98891 History of uterine scar from previous surgery: Secondary | ICD-10-CM

## 2020-03-23 DIAGNOSIS — O4202 Full-term premature rupture of membranes, onset of labor within 24 hours of rupture: Secondary | ICD-10-CM | POA: Diagnosis not present

## 2020-03-23 DIAGNOSIS — Z3A38 38 weeks gestation of pregnancy: Secondary | ICD-10-CM | POA: Diagnosis not present

## 2020-03-23 DIAGNOSIS — O34211 Maternal care for low transverse scar from previous cesarean delivery: Secondary | ICD-10-CM | POA: Diagnosis present

## 2020-03-23 DIAGNOSIS — O99820 Streptococcus B carrier state complicating pregnancy: Secondary | ICD-10-CM

## 2020-03-23 DIAGNOSIS — O26893 Other specified pregnancy related conditions, third trimester: Secondary | ICD-10-CM | POA: Diagnosis present

## 2020-03-23 DIAGNOSIS — Z20822 Contact with and (suspected) exposure to covid-19: Secondary | ICD-10-CM | POA: Diagnosis present

## 2020-03-23 DIAGNOSIS — O9962 Diseases of the digestive system complicating childbirth: Secondary | ICD-10-CM | POA: Diagnosis present

## 2020-03-23 DIAGNOSIS — K5190 Ulcerative colitis, unspecified, without complications: Secondary | ICD-10-CM | POA: Diagnosis present

## 2020-03-23 DIAGNOSIS — K5180 Other ulcerative colitis without complications: Secondary | ICD-10-CM | POA: Diagnosis present

## 2020-03-23 DIAGNOSIS — A0472 Enterocolitis due to Clostridium difficile, not specified as recurrent: Secondary | ICD-10-CM | POA: Diagnosis present

## 2020-03-23 LAB — CBC
HCT: 38.4 % (ref 36.0–46.0)
Hemoglobin: 12.5 g/dL (ref 12.0–15.0)
MCH: 28.8 pg (ref 26.0–34.0)
MCHC: 32.6 g/dL (ref 30.0–36.0)
MCV: 88.5 fL (ref 80.0–100.0)
Platelets: 283 10*3/uL (ref 150–400)
RBC: 4.34 MIL/uL (ref 3.87–5.11)
RDW: 14.8 % (ref 11.5–15.5)
WBC: 14.6 10*3/uL — ABNORMAL HIGH (ref 4.0–10.5)
nRBC: 0 % (ref 0.0–0.2)

## 2020-03-23 LAB — RESPIRATORY PANEL BY RT PCR (FLU A&B, COVID)
Influenza A by PCR: NEGATIVE
Influenza B by PCR: NEGATIVE
SARS Coronavirus 2 by RT PCR: NEGATIVE

## 2020-03-23 LAB — POCT FERN TEST: POCT Fern Test: POSITIVE

## 2020-03-23 LAB — TYPE AND SCREEN
ABO/RH(D): B POS
Antibody Screen: NEGATIVE

## 2020-03-23 SURGERY — Surgical Case
Anesthesia: Spinal | Wound class: Clean Contaminated

## 2020-03-23 MED ORDER — NALOXONE HCL 4 MG/10ML IJ SOLN
1.0000 ug/kg/h | INTRAVENOUS | Status: DC | PRN
  Filled 2020-03-23: qty 5

## 2020-03-23 MED ORDER — LACTATED RINGERS IV SOLN: INTRAVENOUS | Status: DC

## 2020-03-23 MED ORDER — KETOROLAC TROMETHAMINE 30 MG/ML IJ SOLN: 30.0000 mg | Freq: Once | INTRAMUSCULAR | Status: DC

## 2020-03-23 MED ORDER — OXYCODONE-ACETAMINOPHEN 5-325 MG PO TABS
1.0000 | ORAL_TABLET | Freq: Once | ORAL | Status: AC
  Administered 2020-03-23: 1 via ORAL
  Filled 2020-03-23: qty 1

## 2020-03-23 MED ORDER — PHENYLEPHRINE HCL-NACL 20-0.9 MG/250ML-% IV SOLN
INTRAVENOUS | Status: DC | PRN
  Administered 2020-03-23: 60 ug/min via INTRAVENOUS

## 2020-03-23 MED ORDER — FENTANYL CITRATE (PF) 100 MCG/2ML IJ SOLN: 25.0000 ug | INTRAMUSCULAR | Status: DC | PRN

## 2020-03-23 MED ORDER — LACTATED RINGERS IV SOLN: INTRAVENOUS | Status: DC | PRN

## 2020-03-23 MED ORDER — ACETAMINOPHEN 500 MG PO TABS: 1000.0000 mg | ORAL_TABLET | Freq: Once | ORAL | Status: DC

## 2020-03-23 MED ORDER — NALBUPHINE HCL 10 MG/ML IJ SOLN: 5.0000 mg | INTRAMUSCULAR | Status: DC | PRN

## 2020-03-23 MED ORDER — ONDANSETRON HCL 4 MG/2ML IJ SOLN
INTRAMUSCULAR | Status: AC
  Filled 2020-03-23: qty 2

## 2020-03-23 MED ORDER — NALBUPHINE HCL 10 MG/ML IJ SOLN: 5.0000 mg | Freq: Once | INTRAMUSCULAR | Status: AC | PRN

## 2020-03-23 MED ORDER — PROMETHAZINE HCL 25 MG/ML IJ SOLN: 6.2500 mg | INTRAMUSCULAR | Status: DC | PRN

## 2020-03-23 MED ORDER — MORPHINE SULFATE (PF) 0.5 MG/ML IJ SOLN
INTRAMUSCULAR | Status: AC
  Filled 2020-03-23: qty 10

## 2020-03-23 MED ORDER — SODIUM CHLORIDE 0.9 % IV SOLN
2.0000 g | INTRAVENOUS | Status: AC
  Administered 2020-03-23: 2 g via INTRAVENOUS
  Filled 2020-03-23: qty 2

## 2020-03-23 MED ORDER — MORPHINE SULFATE (PF) 0.5 MG/ML IJ SOLN
INTRAMUSCULAR | Status: DC | PRN
  Administered 2020-03-23: .15 mg via INTRATHECAL

## 2020-03-23 MED ORDER — OXYTOCIN 10 UNIT/ML IJ SOLN
INTRAMUSCULAR | Status: DC | PRN
  Administered 2020-03-23: 40 [IU] via INTRAMUSCULAR

## 2020-03-23 MED ORDER — FENTANYL CITRATE (PF) 100 MCG/2ML IJ SOLN
INTRAMUSCULAR | Status: AC
  Filled 2020-03-23: qty 2

## 2020-03-23 MED ORDER — FAMOTIDINE IN NACL 20-0.9 MG/50ML-% IV SOLN: 20.0000 mg | Freq: Once | INTRAVENOUS | Status: AC

## 2020-03-23 MED ORDER — SODIUM CHLORIDE 0.9 % IV SOLN
INTRAVENOUS | Status: AC
  Filled 2020-03-23: qty 2

## 2020-03-23 MED ORDER — KETOROLAC TROMETHAMINE 30 MG/ML IJ SOLN: 30.0000 mg | Freq: Four times a day (QID) | INTRAMUSCULAR | Status: AC | PRN

## 2020-03-23 MED ORDER — SODIUM CHLORIDE 0.9% FLUSH: 3.0000 mL | INTRAVENOUS | Status: DC | PRN

## 2020-03-23 MED ORDER — NALBUPHINE HCL 10 MG/ML IJ SOLN
5.0000 mg | INTRAMUSCULAR | Status: DC | PRN
  Administered 2020-03-24: 5 mg via INTRAVENOUS
  Filled 2020-03-23: qty 1

## 2020-03-23 MED ORDER — LIDOCAINE HCL (PF) 1 % IJ SOLN
INTRAMUSCULAR | Status: AC
  Filled 2020-03-23: qty 5

## 2020-03-23 MED ORDER — DIPHENHYDRAMINE HCL 25 MG PO CAPS
25.0000 mg | ORAL_CAPSULE | ORAL | Status: DC | PRN
  Administered 2020-03-24 – 2020-03-25 (×2): 25 mg via ORAL
  Filled 2020-03-23 (×2): qty 1

## 2020-03-23 MED ORDER — BUPIVACAINE IN DEXTROSE 0.75-8.25 % IT SOLN
INTRATHECAL | Status: DC | PRN
  Administered 2020-03-23: 1.5 mL via INTRATHECAL

## 2020-03-23 MED ORDER — PHENYLEPHRINE HCL (PRESSORS) 10 MG/ML IV SOLN
INTRAVENOUS | Status: DC | PRN
Start: 2020-03-23 — End: 2020-03-23

## 2020-03-23 MED ORDER — PHENYLEPHRINE 40 MCG/ML (10ML) SYRINGE FOR IV PUSH (FOR BLOOD PRESSURE SUPPORT)
PREFILLED_SYRINGE | INTRAVENOUS | Status: AC
  Filled 2020-03-23: qty 10

## 2020-03-23 MED ORDER — DIPHENHYDRAMINE HCL 50 MG/ML IJ SOLN
12.5000 mg | INTRAMUSCULAR | Status: DC | PRN
  Administered 2020-03-24: 04:00:00 12.5 mg via INTRAVENOUS
  Filled 2020-03-23: qty 1

## 2020-03-23 MED ORDER — SODIUM CHLORIDE 0.9 % IV SOLN: INTRAVENOUS | Status: DC | PRN

## 2020-03-23 MED ORDER — TRANEXAMIC ACID-NACL 1000-0.7 MG/100ML-% IV SOLN
INTRAVENOUS | Status: AC
  Filled 2020-03-23: qty 100

## 2020-03-23 MED ORDER — TRANEXAMIC ACID-NACL 1000-0.7 MG/100ML-% IV SOLN
1000.0000 mg | INTRAVENOUS | Status: AC
  Filled 2020-03-23: qty 100

## 2020-03-23 MED ORDER — PHENYLEPHRINE HCL (PRESSORS) 10 MG/ML IV SOLN
INTRAVENOUS | Status: DC | PRN
  Administered 2020-03-23 (×4): 80 ug via INTRAVENOUS

## 2020-03-23 MED ORDER — KETOROLAC TROMETHAMINE 30 MG/ML IJ SOLN
30.0000 mg | Freq: Four times a day (QID) | INTRAMUSCULAR | Status: AC | PRN
  Administered 2020-03-24: 30 mg via INTRAMUSCULAR

## 2020-03-23 MED ORDER — SODIUM CHLORIDE 0.9 % IV SOLN
INTRAVENOUS | Status: AC
  Filled 2020-03-23: qty 500

## 2020-03-23 MED ORDER — FAMOTIDINE IN NACL 20-0.9 MG/50ML-% IV SOLN
INTRAVENOUS | Status: AC
  Administered 2020-03-23: 21:00:00 20 mg via INTRAVENOUS
  Filled 2020-03-23: qty 50

## 2020-03-23 MED ORDER — ONDANSETRON HCL 4 MG/2ML IJ SOLN: 4.0000 mg | Freq: Three times a day (TID) | INTRAMUSCULAR | Status: DC | PRN

## 2020-03-23 MED ORDER — ONDANSETRON HCL 4 MG/2ML IJ SOLN
INTRAMUSCULAR | Status: DC | PRN
  Administered 2020-03-23: 4 mg via INTRAVENOUS

## 2020-03-23 MED ORDER — NALOXONE HCL 0.4 MG/ML IJ SOLN: 0.4000 mg | INTRAMUSCULAR | Status: DC | PRN

## 2020-03-23 MED ORDER — ACETAMINOPHEN 500 MG PO TABS
1000.0000 mg | ORAL_TABLET | Freq: Four times a day (QID) | ORAL | Status: AC
  Administered 2020-03-24 (×4): 1000 mg via ORAL
  Filled 2020-03-23 (×4): qty 2

## 2020-03-23 MED ORDER — ACETAMINOPHEN 160 MG/5ML PO SOLN: 1000.0000 mg | Freq: Once | ORAL | Status: DC

## 2020-03-23 MED ORDER — FENTANYL CITRATE (PF) 100 MCG/2ML IJ SOLN
INTRAMUSCULAR | Status: DC | PRN
  Administered 2020-03-23: 15 ug via INTRATHECAL

## 2020-03-23 MED ORDER — TERBUTALINE SULFATE 1 MG/ML IJ SOLN
0.2500 mg | Freq: Once | INTRAMUSCULAR | Status: AC
  Administered 2020-03-23: 22:00:00 0.25 mg via SUBCUTANEOUS
  Filled 2020-03-23: qty 1

## 2020-03-23 MED ORDER — EPHEDRINE SULFATE 50 MG/ML IJ SOLN: INTRAMUSCULAR | Status: DC | PRN

## 2020-03-23 MED ORDER — SOD CITRATE-CITRIC ACID 500-334 MG/5ML PO SOLN
30.0000 mL | Freq: Once | ORAL | Status: AC
  Administered 2020-03-23: 23:00:00 30 mL via ORAL
  Filled 2020-03-23: qty 30

## 2020-03-23 MED ORDER — SODIUM CHLORIDE 0.9 % IV SOLN
500.0000 mg | INTRAVENOUS | Status: AC
  Administered 2020-03-23: 500 mg via INTRAVENOUS
  Filled 2020-03-23: qty 500

## 2020-03-23 MED ORDER — NALBUPHINE HCL 10 MG/ML IJ SOLN
5.0000 mg | Freq: Once | INTRAMUSCULAR | Status: AC | PRN
  Administered 2020-03-24: 5 mg via INTRAVENOUS

## 2020-03-23 MED ORDER — TRANEXAMIC ACID-NACL 1000-0.7 MG/100ML-% IV SOLN
INTRAVENOUS | Status: DC | PRN
  Administered 2020-03-23: 1000 mg via INTRAVENOUS

## 2020-03-23 SURGICAL SUPPLY — 34 items
CHLORAPREP W/TINT 26ML (MISCELLANEOUS) ×2 IMPLANT
CLAMP CORD UMBIL (MISCELLANEOUS) IMPLANT
CLOTH BEACON ORANGE TIMEOUT ST (SAFETY) ×2 IMPLANT
DRSG OPSITE POSTOP 4X10 (GAUZE/BANDAGES/DRESSINGS) ×2 IMPLANT
ELECT REM PT RETURN 9FT ADLT (ELECTROSURGICAL) ×2
ELECTRODE REM PT RTRN 9FT ADLT (ELECTROSURGICAL) ×1 IMPLANT
EXTRACTOR VACUUM M CUP 4 TUBE (SUCTIONS) IMPLANT
GAUZE SPONGE 4X4 12PLY STRL LF (GAUZE/BANDAGES/DRESSINGS) ×2 IMPLANT
GLOVE BIOGEL PI IND STRL 7.0 (GLOVE) ×3 IMPLANT
GLOVE BIOGEL PI INDICATOR 7.0 (GLOVE) ×3
GLOVE ECLIPSE 7.0 STRL STRAW (GLOVE) ×2 IMPLANT
GOWN STRL REUS W/TWL LRG LVL3 (GOWN DISPOSABLE) ×4 IMPLANT
KIT ABG SYR 3ML LUER SLIP (SYRINGE) IMPLANT
NDL HYPO 25X5/8 SAFETYGLIDE (NEEDLE) ×1 IMPLANT
NEEDLE HYPO 22GX1.5 SAFETY (NEEDLE) ×2 IMPLANT
NEEDLE HYPO 25X5/8 SAFETYGLIDE (NEEDLE) ×2 IMPLANT
NS IRRIG 1000ML POUR BTL (IV SOLUTION) ×2 IMPLANT
PACK C SECTION WH (CUSTOM PROCEDURE TRAY) ×2 IMPLANT
PAD ABD 7.5X8 STRL (GAUZE/BANDAGES/DRESSINGS) ×2 IMPLANT
PAD OB MATERNITY 4.3X12.25 (PERSONAL CARE ITEMS) ×2 IMPLANT
PENCIL SMOKE EVAC W/HOLSTER (ELECTROSURGICAL) ×2 IMPLANT
RTRCTR C-SECT PINK 25CM LRG (MISCELLANEOUS) IMPLANT
RTRCTR C-SECT PINK 34CM XLRG (MISCELLANEOUS) ×1 IMPLANT
SUT PDS AB 0 CTX 36 PDP370T (SUTURE) ×1 IMPLANT
SUT PLAIN 2 0 XLH (SUTURE) IMPLANT
SUT VIC AB 0 CTX 36 (SUTURE) ×4
SUT VIC AB 0 CTX36XBRD ANBCTRL (SUTURE) ×2 IMPLANT
SUT VIC AB 2-0 CT1 27 (SUTURE) ×2
SUT VIC AB 2-0 CT1 TAPERPNT 27 (SUTURE) IMPLANT
SUT VIC AB 4-0 KS 27 (SUTURE) ×2 IMPLANT
SYR CONTROL 10ML LL (SYRINGE) ×2 IMPLANT
TOWEL OR 17X24 6PK STRL BLUE (TOWEL DISPOSABLE) ×2 IMPLANT
TRAY FOLEY W/BAG SLVR 14FR LF (SET/KITS/TRAYS/PACK) ×2 IMPLANT
WATER STERILE IRR 1000ML POUR (IV SOLUTION) ×2 IMPLANT

## 2020-03-23 NOTE — MAU Provider Note (Addendum)
Joyce Solomon is a 34 y.o. [phone] female at [redacted]w[redacted]d RN Labor check, not seen by provider SVE by RN: Dilation: 1.5 Effacement (%): 40, 50 Station: -3 Exam by:: TPhilis Pique RN NST: FHR baseline 145 bpm, Variability: moderate, Accelerations:present, Decelerations:  Absent= Cat 1/Reactive Toco: irregular Requests pain meds, rx percocet   Patient had SROM, confirmed on fern test Care turned over to Dr. AHarolyn Rutherford [facility]5/02/2020 8:28 PM    Attestation of Attending Supervision of Advanced Practice Provider (PA/CNM/NP): Evaluation and management procedures were performed by the Advanced Practice Provider under my supervision and collaboration.  I have reviewed the Advanced Practice Provider's note and chart, and I agree with the management and plan. I have also made any necessary editorial changes.  Please see H&P for more details.   UVerita Schneiders MD, FThomsonAttending OHudson FHshs [facility] Hospitalfor [facility]De[employer] [facility] CColma

## 2020-03-23 NOTE — Op Note (Signed)
Joyce Solomon PROCEDURE DATE: 03/23/2020  PREOPERATIVE DIAGNOSES: Intrauterine pregnancy at 35w1dweeks gestation; previous cesarean section x 2; spontaneous rupture of membranes; early labor  POSTOPERATIVE DIAGNOSES: The same  PROCEDURE: Repeat Low Transverse Cesarean Section  SURGEON:  Dr. UVerita Schneiders ASSISTANT:  Dr. MClayton Lefort Dr. VLyndee HensenPGY1  ANESTHESIOLOGY TEAM: Anesthesiologist: HBrennan Bailey MD CRNA: SGenevie Ann CRNA; WAdalberto Ill CRNA  INDICATIONS: Joyce RATHis a 34y.o. [phone] 355w1dere for repeat cesarean section secondary to the indications listed under preoperative diagnoses; please see preoperative note for further details.  The risks of cesarean section were discussed with the patient including but were not limited to 35: bleeding which may require transfusion or reoperation; infection which may require antibiotics; injury to bowel, bladder, ureters or other surrounding organs; injury to the fetus; need for additional procedures including hysterectomy in the event of a life-threatening hemorrhage; placental abnormalities wth subsequent pregnancies, incisional problems, thromboembolic phenomenon and other postoperative/anesthesia complications.   The patient concurred with the proposed plan, giving informed written consent for the procedure.    FINDINGS:  Viable female infant in cephalic presentation.  Apgars 8 and 9.  Clear amniotic fluid.  Intact placenta, three vessel cord.  Normal uterus, fallopian tubes and ovaries bilaterally. Some serosal adhesions to vesicouterine peritoneum that were lysed easily. Very thin lower uterine segment prior to delivery, about 2-3 mm thickness.   ANESTHESIA: Spinal ESTIMATED BLOOD LOSS: 300 ml SPECIMENS: Placenta sent to L&D COMPLICATIONS: None immediate  PROCEDURE IN DETAIL:  The patient preoperatively received intravenous antibiotics and had sequential compression devices applied to her  lower extremities.  She was then taken to the operating room where spinal anesthesia was administered* and was found to be adequate. She was then placed in a dorsal supine position with a leftward tilt, and prepped and draped in a sterile manner.  A foley catheter was placed into her bladder and attached to constant gravity.  After an adequate timeout was performed, a Pfannenstiel skin incision was made with scalpel on her preexisting scar and carried through to the underlying layer of fascia. The fascia was incised in the midline, and this incision was extended bilaterally using the Mayo scissors.  Kocher clamps were applied to the superior aspect of the fascial incision and the underlying rectus muscles were dissected off sharply.  A similar process was carried out on the inferior aspect of the fascial incision. The rectus muscles were separated in the midline and the peritoneum was entered sharply. The Alexis self-retaining retractor was introduced into the abdominal cavity.  Attention was turned to the lower uterine segment and the aforementioned serosal adhesions were lysed using electrocautery.  A low transverse hysterotomy was then made with a scalpel and extended bilaterally bluntly.  The infant was successfully delivered, the cord was clamped and cut after one minute, and the infant was handed over to the awaiting neonatology team. Uterine massage was then administered, and the placenta delivered intact with a three-vessel cord. The uterus was then cleared of clots and debris.  The hysterotomy was closed with 0 Vicryl in a running locked fashion, and an imbricating layer was also placed with 0 Vicryl.  Figure-of-eight 0 Vicryl serosal stitches were placed to help with hemostasis.  The pelvis was cleared of all clot and debris. Hemostasis was confirmed on all surfaces.  The retractor was removed.  The peritoneum and the rectus muscles were reapproximated using 0 Vicryl interrupted stitches. The fascia was  then  closed using 0 PDS in a running fashion.  The subcutaneous layer was irrigated, reapproximated with 2-0 plain gut interrupted stitches, and the skin was closed with a 4-0 Vicryl subcuticular stitch. The patient tolerated the procedure well. Sponge, instrument and needle counts were correct x 3.  She was taken to the recovery room in stable condition.    Verita Schneiders, MD, [place] for [employer], [place]

## 2020-03-23 NOTE — MAU Note (Signed)
Last night had some thick brown mucous.  Started having some ctxs last night, still coming- doesn't know how often; they are getting stronger and closer.. No bleeding or water leaking.

## 2020-03-23 NOTE — Anesthesia Procedure Notes (Signed)
Spinal  Patient location during procedure: OR Start time: 03/23/2020 10:50 PM End time: 03/23/2020 10:58 PM Staffing Performed: anesthesiologist  Anesthesiologist: Brennan Bailey, MD Preanesthetic Checklist Completed: patient identified, IV checked, risks and benefits discussed, monitors and equipment checked, pre-op evaluation and timeout performed Spinal Block Patient position: sitting Prep: DuraPrep and site prepped and draped Patient monitoring: heart rate, continuous pulse ox and blood pressure Approach: midline Location: L2-3 Injection technique: single-shot Needle Needle type: Pencan  Needle gauge: 24 G Needle length: 10 cm Assessment Sensory level: T4 Additional Notes Risks, benefits, and alternative discussed. Patient gave consent to procedure. Prepped and draped in sitting position. Difficult spinal placement due to inadequate patient positioning. Intrathecal space accessed at L3-4 but poor flow of CSF and unable to aspirate despite several attempts to reposition. Patient given 50% nitrous oxide prior to next attempt at L2-3. Clear CSF obtained after one needle pass at this level. Positive terminal aspiration. No pain or paraesthesias with injection. Patient tolerated procedure well. Vital signs stable. Tawny Asal, MD

## 2020-03-23 NOTE — MAU Provider Note (Signed)
   S: [name] is a 34 y.o. [phone] at [redacted]w[redacted]d who presents to MAU today complaining contractions q 2-7 minutes  today. She denies vaginal bleeding. She denies LOF. She reports normal fetal movement.    O: BP 113/68   Pulse 95   Temp 98.7 F (37.1 C) (Oral)   Resp 16   Ht 4' 11"  (1.499 m)   Wt 69.7 kg   LMP 06/30/2019 (Exact Date)   SpO2 100%   BMI 31.02 kg/m  GENERAL: Well-developed, well-nourished female in no acute distress.  HEAD: Normocephalic, atraumatic.  CHEST: Normal effort of breathing, regular heart rate ABDOMEN: Soft, nontender, gravid  Cervical exam:  Dilation: 1.5 Effacement (%): 30 Cervical Position: Middle Station: -3 Presentation: Vertex Exam by:: TPhilis PiqueRN    Fetal Monitoring: Baseline: 130s Variability: avg LTV Accelerations: + Decelerations: none Contractions: irreg with UI, 2-654ms   A: SIUP at 3851w1dalse labor  P: Prev C/S with repeat for 03/29/20 D/C home with labor precautions Msg to [facility] to schedule ROB on 5/6 or 5/7  ShaMyrtis SerNM 03/23/2020 12:54 PM

## 2020-03-23 NOTE — MAU Note (Signed)
2055 Dr. Harolyn Rutherford notified of patient and ruptured membranes.   2100 Dr. Ermalene Searing notified of patient.   2105 OB OR Charge nurse notified about patient    House coverage made aware per Andee Poles, RN   Anesthesiologist made aware per Hilda Blades, RN

## 2020-03-23 NOTE — MAU Note (Signed)
I have communicated with Derrill Memo, CNM and reviewed vital signs:  Vitals:   03/23/20 1024 03/23/20 1258  BP:  118/67  Pulse:    Resp:    Temp:    SpO2: 100%     Vaginal exam:  Dilation: 1.5 Effacement (%): 30 Cervical Position: Middle Station: -3 Presentation: Vertex Exam by:: Philis Pique RN ,   Also reviewed contraction pattern and that non-stress test is reactive.  It has been documented that patient is contracting every irregularly with no  cervical change over 1.5  hours not indicating active labor.  Patient denies any other complaints.  Based on this report provider has given order for discharge.  A discharge order and diagnosis entered by a provider.   Labor discharge instructions reviewed with patient.

## 2020-03-23 NOTE — H&P (Signed)
LABOR AND DELIVERY ADMISSION HISTORY AND PHYSICAL NOTE  Joyce Solomon is a 34 y.o. female [phone] with IUP at 44w1dby LMP c/w 32wk UKoreapresenting for SROM.   Presented to MAU earlier today for labor check due to frequent contractions, cervix unchanged and sent home Returned this evening and again unchanged, however grossly ruptured for clear fluid just prior to discharge She reports positive fetal movement. She denies vaginal bleeding.   She plans on breast and bottle feeding. She requests Nexplanon for birth control.  [facility] , CWD, normal anatomy, cephalic presentation, posterior placenta, 26%ile, EFW 2701 grams  Pregnancy complications:  - hx thyrotoxicosis - Hx Ulcerative colitis, intermittently on humira, recent flare w steroid taper - GBS positive  Past Medical History: Past Medical History:  Diagnosis Date  . Allergy    SEASONAL  . Blood transfusion without reported diagnosis   . Headache   . Iron Deficiency Anemia 2008   s/p transfusions 2009, 2010; inconsistent compliance with PO iron.   . Non compliance w medication regimen 06/2015   stopped Remicade, may not have been taking Azulfidine.  . Thyrotoxicosis   . Ulcerative Colitis 2007   on Humira    Past Surgical History: Past Surgical History:  Procedure Laterality Date  . CESAREAN SECTION N/A 12/09/2013   Procedure: Primary Cesarean Section Delivery Baby Girl @ 1960, Apgars 8/8;  Surgeon: KLogan Bores MD;  [place]  Service: Obstetrics;  Laterality: N/A;  . CESAREAN SECTION N/A 08/14/2017   Procedure: CESAREAN SECTION;  Surgeon: RPaula Compton MD;  [place]  Service: Obstetrics;  Laterality: N/A;  . COLONOSCOPY  01/19/2011   Dr GPenelope Coop no active UC  . COLONOSCOPY W/ BIOPSIES N/A 04/2014   Dr KDeatra Ina Left sided colitis from prox descending to rectum; no inflammation at or proximal to splenic flexure  . DILATION AND  EVACUATION  08/21/2012   Procedure: DILATATION AND EVACUATION;  Surgeon: KLogan Bores MD;  [place];  Service: Gynecology  . TONSILLECTOMY      Obstetrical History: OB History    Gravida  6   Para  3   Term  3   Preterm      AB  2   Living  3     SAB  1   TAB  1   Ectopic      Multiple  0   Live Births  3           Social History: Social History   Socioeconomic History  . Marital status: Single    Spouse name: Not on file  . Number of children: 2  . Years of education: Not on file  . Highest education level: Not on file  Occupational History  . Occupation: unemployed  Tobacco Use  . Smoking status: Former Smoker    Years: 3.00    Quit date: 12/13/2018    Years since quitting: 1.2  . Smokeless tobacco: Never Used  . Tobacco comment: smokes a cig "every blue moon"  Substance and Sexual Activity  . Alcohol use: Not Currently    Alcohol/week: 0.0 standard drinks    Comment: occassional  . Drug use: No  . Sexual activity: Yes    Birth control/protection: None  Other Topics Concern  . Not on file  Social History Narrative  . Not on file   Social Determinants of Health   Financial Resource Strain:   . Difficulty of  Paying Living Expenses:   Food Insecurity:   . Worried About Charity fundraiser in the Last Year:   . Arboriculturist in the Last Year:   Transportation Needs:   . Film/video editor (Medical):   [name] Lack of Transportation (Non-Medical):   Physical Activity:   . Days of Exercise per Week:   . Minutes of Exercise per Session:   Stress:   . Feeling of Stress :   Social Connections:   . Frequency of Communication with Friends and Family:   . Frequency of Social Gatherings with Friends and Family:   . Attends Religious Services:   . Active Member of Clubs or Organizations:   . Attends Archivist Meetings:   [name] Marital Status:     Family History: Family History  Problem Relation Age of Onset  .  Miscarriages / Stillbirths Other   . Diabetes Sister   . Hypotension Neg Hx   . Alcohol abuse Neg Hx   . Arthritis Neg Hx   . Asthma Neg Hx   . Birth defects Neg Hx   . COPD Neg Hx   . Depression Neg Hx   . Drug abuse Neg Hx   . Hearing loss Neg Hx   . Heart disease Neg Hx   . Hyperlipidemia Neg Hx   . Hypertension Neg Hx   . Kidney disease Neg Hx   . Learning disabilities Neg Hx   . Mental illness Neg Hx   . Mental retardation Neg Hx   . Colon cancer Neg Hx   . Colon polyps Neg Hx   . Esophageal cancer Neg Hx   . Stomach cancer Neg Hx   . Rectal cancer Neg Hx     Allergies: Allergies  Allergen Reactions  . Latex Rash  . Remicade [Infliximab] Itching    Medications Prior to Admission  Medication Sig Dispense Refill Last Dose  . Blood Pressure Monitoring (BLOOD PRESSURE KIT) DEVI 1 Device by Does not apply route as needed. 1 Device 0   . cyclobenzaprine (FLEXERIL) 10 MG tablet Take 1 tablet (10 mg total) by mouth 2 (two) times daily as needed for muscle spasms. (Patient not taking: Reported on 03/12/2020) 20 tablet 0   . Elastic Bandages & Supports (COMFORT FIT MATERNITY SUPP MED) MISC Wear belt during the day, as needed, and remove at night prior to bedtime. 1 each 0   . predniSONE (DELTASONE) 5 MG tablet Take 4 tablets (20 mg total) by mouth daily with breakfast for 14 days, THEN 3 tablets (15 mg total) daily with breakfast for 14 days, THEN 2 tablets (10 mg total) daily with breakfast for 14 days, THEN 1 tablet (5 mg total) daily with breakfast for 14 days. 100 tablet 0   . Prenatal Vit w/Fe-Methylfol-FA (PNV PO) Take 1 tablet by mouth daily.      . vancomycin (VANCOCIN HCL) 125 MG capsule Take 1 capsule (125 mg total) by mouth every 6 (six) hours for 14 days. 56 capsule 0      Review of Systems  All systems reviewed and negative except as stated in HPI  Physical Exam Blood pressure 122/79, pulse 99, temperature 98.2 F (36.8 C), temperature source Oral, resp. rate  20, height 4' 11"  (1.499 m), weight 69.2 kg, last menstrual period 06/30/2019. General appearance: alert, oriented, NAD Lungs: normal respiratory effort Heart: regular rate Abdomen: soft, non-tender; gravid Extremities: No calf swelling or tenderness FHR: baseline 130, moderate variability, -accels, +variables  Prenatal labs: ABO, Rh: B/Positive/-- (10/28 1609) Antibody: Negative (10/28 1609) Rubella: 1.54 (10/28 1609) RPR: Non Reactive (03/02 1004)  HBsAg: Negative (10/28 1609)  HIV: Non Reactive (03/02 1004)  GC/Chlamydia: neg/neg 02/25/2020  GBS: Positive/-- (04/20 0428)  2-hr GTT: normal 01/20/2020 Genetic screening:  Low risk Panorama Anatomy US: normal  Prenatal Transfer Tool  Maternal Diabetes: No Genetic Screening: Normal Maternal Ultrasounds/Referrals: Normal Fetal Ultrasounds or other Referrals:  None Maternal Substance Abuse:  No Significant Maternal Medications:  Meds include: Other: currently on prednisone taper Significant Maternal Lab Results: Group B Strep positive  Results for orders placed or performed during the hospital encounter of 03/23/20 (from the past 24 hour(s))  [facility] Time: 03/23/20  8:47 PM  Result Value Ref Range   POCT Fern Test Positive = ruptured amniotic membanes     Patient Active Problem List   Diagnosis Date Noted  . Group B Streptococcus carrier, +RV culture, currently pregnant 03/14/2020  . Supervision of high risk pregnancy, antepartum 09/09/2019  . Anal fissure 09/11/2018  . Rectal bleeding 04/23/2014  . History of cesarean delivery affecting pregnancy 12/10/2013  . Prior pregnancy complicated by SGA (small for gestational age), antepartum 12/09/2013  . DENTAL CARIES 12/22/2009  . Thyrotoxicosis 08/03/2008  . Constipation 07/29/2008  . ACNE, MILD 07/29/2008  . Anemia 06/30/2008  . Ulcerative colitis ([place]) 11/20/2005    Assessment: PRESCIOUS HURLESS is a 34 y.o. [id number] at 95w1dhere for scheduled  CS.  #SROM #Early labor #Hx of two prior CS Presenting after SROM for clear in early labor. The risks of cesarean section discussed with the patient included but were not limited to: bleeding which may require transfusion or reoperation; infection which may require antibiotics; injury to bowel, bladder, ureters or other surrounding organs; injury to the fetus; need for additional procedures including hysterectomy in the event of a life-threatening hemorrhage; placental abnormalities with subsequent pregnancies, incisional problems, thromboembolic phenomenon and other postoperative/anesthesia complications. The patient concurred with the proposed plan, giving informed written consent for the procedure. Patient has been NPO since last night she will remain NPO for procedure. Anesthesia and OR aware. Preoperative prophylactic antibiotics and SCDs ordered on call to the OR. To OR when ready.   #Anesthesia: Spinal #FWB: FHR Cat II, to OR once COVID swab has resulted, CEFM until that time #GBS/ID: Positive #COVID: swab pending #MOF: Breast and bottle #MOC: Nexplanon #Circ: n/a  #Ulcerative colitis:  Recent flare with initiation of steroid taper after visit w GI on 03/04/2020. Per GI panel sent on 03/09/2020 she was positive for C.dificile toxin A/B, started on Vancomycin, currently denies any diarrhea,  - cont prednisone 169mdaily, taper per GI recs (see note 03/04/2020) - cont Vancomycin 12566m6h until 03/26/2020 (reports she started treatment course 03/13/2020)  MatClarnce Flock4/2021, 8:57 PM

## 2020-03-23 NOTE — MAU Note (Signed)
PT SAYS WAS HERE  TODAY - THIS MORNING- VE 1-2 CM. UC ALL DAY . [place] WITH  [place] .  DENIES HSV AND MRSA.  GBS- POSITIVE .

## 2020-03-23 NOTE — Discharge Instructions (Signed)
Braxton Hicks Contractions Contractions of the uterus can occur throughout pregnancy, but they are not always a sign that you are in labor. You may have practice contractions called Braxton Hicks contractions. These false labor contractions are sometimes confused with true labor. What are [place] contractions? Braxton Hicks contractions are tightening movements that occur in the muscles of the uterus before labor. Unlike true labor contractions, these contractions do not result in opening (dilation) and thinning of the cervix. Toward the end of pregnancy (32-34 weeks), Braxton Hicks contractions can happen more often and may become stronger. These contractions are sometimes difficult to tell apart from true labor because they can be very uncomfortable. You should not feel embarrassed if you go to the hospital with false labor. Sometimes, the only way to tell if you are in true labor is for your health care provider to look for changes in the cervix. The health care provider will do a physical exam and may monitor your contractions. If you are not in true labor, the exam should show that your cervix is not dilating and your water has not broken. If there are no other health problems associated with your pregnancy, it is completely safe for you to be sent home with false labor. You may continue to have Braxton Hicks contractions until you go into true labor. How to tell the difference between true labor and false labor True labor  Contractions last 30-70 seconds.  Contractions become very regular.  Discomfort is usually felt in the top of the uterus, and it spreads to the lower abdomen and low back.  Contractions do not go away with walking.  Contractions usually become more intense and increase in frequency.  The cervix dilates and gets thinner. False labor  Contractions are usually shorter and not as strong as true labor contractions.  Contractions are usually irregular.  Contractions  are often felt in the front of the lower abdomen and in the groin.  Contractions may go away when you walk around or change positions while lying down.  Contractions get weaker and are shorter-lasting as time goes on.  The cervix usually does not dilate or become thin. Follow these instructions at home:   Take over-the-counter and prescription medicines only as told by your health care provider.  Keep up with your usual exercises and follow other instructions from your health care provider.  Eat and drink lightly if you think you are going into labor.  If Braxton Hicks contractions are making you uncomfortable: ? Change your position from lying down or resting to walking, or change from walking to resting. ? Sit and rest in a tub of warm water. ? Drink enough fluid to keep your urine pale yellow. Dehydration may cause these contractions. ? Do slow and deep breathing several times an hour.  Keep all follow-up prenatal visits as told by your health care provider. This is important. Contact a health care provider if:  You have a fever.  You have continuous pain in your abdomen. Get help right away if:  Your contractions become stronger, more regular, and closer together.  You have fluid leaking or gushing from your vagina.  You pass blood-tinged mucus (bloody show).  You have bleeding from your vagina.  You have low back pain that you never had before.  You feel your babys head pushing down and causing pelvic pressure.  Your baby is not moving inside you as much as it used to. Summary  Contractions that occur before labor are  called Braxton Hicks contractions, false labor, or practice contractions.  Braxton Hicks contractions are usually shorter, weaker, farther apart, and less regular than true labor contractions. True labor contractions usually become progressively stronger and regular, and they become more frequent.  Manage discomfort from [facility] contractions  by changing position, resting in a warm bath, drinking plenty of water, or practicing deep breathing. This information is not intended to replace advice given to you by your health care provider. Make sure you discuss any questions you have with your health care provider. Document Revised: 10/19/2017 Document Reviewed: 03/22/2017 Elsevier Patient Education  [place].

## 2020-03-23 NOTE — Anesthesia Preprocedure Evaluation (Signed)
Anesthesia Evaluation  Patient identified by MRN, date of birth, ID band Patient awake    Reviewed: Allergy & Precautions, NPO status , Patient's Chart, lab work & pertinent test results  History of Anesthesia Complications Negative for: history of anesthetic complications  Airway Mallampati: II  TM Distance: >3 FB Neck ROM: Full    Dental no notable dental hx.    Pulmonary neg pulmonary ROS, former smoker,    Pulmonary exam normal        Cardiovascular negative cardio ROS Normal cardiovascular exam     Neuro/Psych negative neurological ROS  negative psych ROS   GI/Hepatic Neg liver ROS, Ulcerative colitis   Endo/Other  Hyperthyroidism   Renal/GU negative Renal ROS     Musculoskeletal negative musculoskeletal ROS (+)   Abdominal   Peds  Hematology negative hematology ROS (+)   Anesthesia Other Findings   Reproductive/Obstetrics (+) Pregnancy (Hx of C/S x2)                             Anesthesia Physical Anesthesia Plan  ASA: III and emergent  Anesthesia Plan: Spinal   Post-op Pain Management:    Induction:   PONV Risk Score and Plan: 4 or greater and Treatment may vary due to age or medical condition, Ondansetron and Dexamethasone  Airway Management Planned: Natural Airway  Additional Equipment: None  Intra-op Plan:   Post-operative Plan:   Informed Consent: I have reviewed the patients History and Physical, chart, labs and discussed the procedure including the risks, benefits and alternatives for the proposed anesthesia with the patient or authorized representative who has indicated his/her understanding and acceptance.       Plan Discussed with: CRNA  Anesthesia Plan Comments: (Previous C/S x2, in labor, making cervical change. Ate meal at 1500 today (taco). Discussed with Dr. Harolyn Rutherford, urgent case needs to proceed despite inadequate NPO status. Daiva Huge, MD)         Anesthesia Quick Evaluation

## 2020-03-24 ENCOUNTER — Encounter (HOSPITAL_COMMUNITY): Payer: Self-pay | Admitting: Obstetrics & Gynecology

## 2020-03-24 DIAGNOSIS — A0472 Enterocolitis due to Clostridium difficile, not specified as recurrent: Secondary | ICD-10-CM

## 2020-03-24 DIAGNOSIS — K5180 Other ulcerative colitis without complications: Secondary | ICD-10-CM

## 2020-03-24 DIAGNOSIS — Z3A38 38 weeks gestation of pregnancy: Secondary | ICD-10-CM

## 2020-03-24 DIAGNOSIS — O34211 Maternal care for low transverse scar from previous cesarean delivery: Principal | ICD-10-CM

## 2020-03-24 DIAGNOSIS — O4202 Full-term premature rupture of membranes, onset of labor within 24 hours of rupture: Secondary | ICD-10-CM

## 2020-03-24 DIAGNOSIS — O99824 Streptococcus B carrier state complicating childbirth: Secondary | ICD-10-CM

## 2020-03-24 DIAGNOSIS — Z98891 History of uterine scar from previous surgery: Secondary | ICD-10-CM

## 2020-03-24 LAB — CBC
HCT: 31.2 % — ABNORMAL LOW (ref 36.0–46.0)
Hemoglobin: 10.4 g/dL — ABNORMAL LOW (ref 12.0–15.0)
MCH: 29.3 pg (ref 26.0–34.0)
MCHC: 33.3 g/dL (ref 30.0–36.0)
MCV: 87.9 fL (ref 80.0–100.0)
Platelets: 248 10*3/uL (ref 150–400)
RBC: 3.55 MIL/uL — ABNORMAL LOW (ref 3.87–5.11)
RDW: 14.7 % (ref 11.5–15.5)
WBC: 9.6 10*3/uL (ref 4.0–10.5)
nRBC: 0 % (ref 0.0–0.2)

## 2020-03-24 LAB — COMPREHENSIVE METABOLIC PANEL
ALT: 10 U/L (ref 0–44)
AST: 15 U/L (ref 15–41)
Albumin: 3 g/dL — ABNORMAL LOW (ref 3.5–5.0)
Alkaline Phosphatase: 128 U/L — ABNORMAL HIGH (ref 38–126)
Anion gap: 12 (ref 5–15)
BUN: <5 mg/dL — ABNORMAL LOW (ref 6–20)
CO2: 19 mmol/L — ABNORMAL LOW (ref 22–32)
Calcium: 9.1 mg/dL (ref 8.9–10.3)
Chloride: 104 mmol/L (ref 98–111)
Creatinine, Ser: 0.48 mg/dL (ref 0.44–1.00)
GFR calc Af Amer: >60 mL/min (ref >60)
GFR calc non Af Amer: >60 mL/min (ref >60)
Glucose, Bld: 103 mg/dL — ABNORMAL HIGH (ref 70–99)
Potassium: 3.5 mmol/L (ref 3.5–5.1)
Sodium: 135 mmol/L (ref 135–145)
Total Bilirubin: 0.3 mg/dL (ref 0.3–1.2)
Total Protein: 7.1 g/dL (ref 6.5–8.1)

## 2020-03-24 LAB — RPR: RPR Ser Ql: NONREACTIVE

## 2020-03-24 MED ORDER — WITCH HAZEL-GLYCERIN EX PADS: 1.0000 "application " | MEDICATED_PAD | CUTANEOUS | Status: DC | PRN

## 2020-03-24 MED ORDER — SENNOSIDES-DOCUSATE SODIUM 8.6-50 MG PO TABS
2.0000 | ORAL_TABLET | ORAL | Status: DC
  Administered 2020-03-24: 2 via ORAL
  Filled 2020-03-24: qty 2

## 2020-03-24 MED ORDER — DIBUCAINE (PERIANAL) 1 % EX OINT: 1.0000 "application " | TOPICAL_OINTMENT | CUTANEOUS | Status: DC | PRN

## 2020-03-24 MED ORDER — MEASLES, MUMPS & RUBELLA VAC IJ SOLR: 0.5000 mL | Freq: Once | INTRAMUSCULAR | Status: DC

## 2020-03-24 MED ORDER — OXYCODONE HCL 5 MG PO TABS: 5.0000 mg | ORAL_TABLET | ORAL | Status: DC | PRN

## 2020-03-24 MED ORDER — COCONUT OIL OIL: 1.0000 "application " | TOPICAL_OIL | Status: DC | PRN

## 2020-03-24 MED ORDER — OXYTOCIN 40 UNITS IN NORMAL SALINE INFUSION - SIMPLE MED: 2.5000 [IU]/h | INTRAVENOUS | Status: AC

## 2020-03-24 MED ORDER — NALBUPHINE HCL 10 MG/ML IJ SOLN
INTRAMUSCULAR | Status: AC
  Filled 2020-03-24: qty 1

## 2020-03-24 MED ORDER — GABAPENTIN 300 MG PO CAPS
300.0000 mg | ORAL_CAPSULE | Freq: Two times a day (BID) | ORAL | Status: DC
  Administered 2020-03-24 (×2): 300 mg via ORAL
  Filled 2020-03-24 (×3): qty 1

## 2020-03-24 MED ORDER — VANCOMYCIN 50 MG/ML ORAL SOLUTION
125.0000 mg | Freq: Four times a day (QID) | ORAL | Status: DC
  Administered 2020-03-24 (×4): 125 mg via ORAL
  Filled 2020-03-24 (×6): qty 2.5

## 2020-03-24 MED ORDER — OXYCODONE-ACETAMINOPHEN 5-325 MG PO TABS: 2.0000 | ORAL_TABLET | ORAL | Status: DC | PRN

## 2020-03-24 MED ORDER — LACTATED RINGERS IV SOLN: INTRAVENOUS | Status: DC

## 2020-03-24 MED ORDER — SIMETHICONE 80 MG PO CHEW
80.0000 mg | CHEWABLE_TABLET | ORAL | Status: DC | PRN
  Administered 2020-03-24: 80 mg via ORAL
  Filled 2020-03-24: qty 1

## 2020-03-24 MED ORDER — KETOROLAC TROMETHAMINE 30 MG/ML IJ SOLN
INTRAMUSCULAR | Status: AC
  Filled 2020-03-24: qty 1

## 2020-03-24 MED ORDER — ENOXAPARIN SODIUM 40 MG/0.4ML ~~LOC~~ SOLN
40.0000 mg | SUBCUTANEOUS | Status: DC
  Administered 2020-03-24: 21:00:00 40 mg via SUBCUTANEOUS
  Filled 2020-03-24: qty 0.4

## 2020-03-24 MED ORDER — VANCOMYCIN 50 MG/ML ORAL SOLUTION: 125.0000 mg | Freq: Four times a day (QID) | ORAL | Status: DC

## 2020-03-24 MED ORDER — FERROUS SULFATE 325 (65 FE) MG PO TABS
325.0000 mg | ORAL_TABLET | Freq: Two times a day (BID) | ORAL | Status: DC
  Administered 2020-03-24 – 2020-03-25 (×3): 325 mg via ORAL
  Filled 2020-03-24 (×3): qty 1

## 2020-03-24 MED ORDER — HYDROMORPHONE HCL 1 MG/ML IJ SOLN: 1.0000 mg | INTRAMUSCULAR | Status: DC | PRN

## 2020-03-24 MED ORDER — SIMETHICONE 80 MG PO CHEW
80.0000 mg | CHEWABLE_TABLET | ORAL | Status: DC
  Administered 2020-03-24: 80 mg via ORAL
  Filled 2020-03-24: qty 1

## 2020-03-24 MED ORDER — PREDNISONE 5 MG PO TABS: 15.0000 mg | ORAL_TABLET | Freq: Every day | ORAL | Status: DC

## 2020-03-24 MED ORDER — ETONOGESTREL 68 MG ~~LOC~~ IMPL
68.0000 mg | DRUG_IMPLANT | Freq: Once | SUBCUTANEOUS | Status: DC
  Filled 2020-03-24 (×2): qty 1

## 2020-03-24 MED ORDER — LIDOCAINE HCL 1 % IJ SOLN
0.0000 mL | Freq: Once | INTRAMUSCULAR | Status: AC | PRN
  Administered 2020-03-24: 14:00:00 20 mL via INTRADERMAL
  Filled 2020-03-24: qty 20

## 2020-03-24 MED ORDER — IBUPROFEN 800 MG PO TABS
800.0000 mg | ORAL_TABLET | Freq: Four times a day (QID) | ORAL | Status: DC
  Administered 2020-03-24 – 2020-03-25 (×2): 800 mg via ORAL
  Filled 2020-03-24 (×2): qty 1

## 2020-03-24 MED ORDER — TRAMADOL HCL 50 MG PO TABS: 50.0000 mg | ORAL_TABLET | Freq: Four times a day (QID) | ORAL | Status: DC | PRN

## 2020-03-24 MED ORDER — DIPHENHYDRAMINE HCL 25 MG PO CAPS: 25.0000 mg | ORAL_CAPSULE | Freq: Four times a day (QID) | ORAL | Status: DC | PRN

## 2020-03-24 MED ORDER — CYCLOBENZAPRINE HCL 10 MG PO TABS
10.0000 mg | ORAL_TABLET | Freq: Two times a day (BID) | ORAL | Status: DC | PRN
  Filled 2020-03-24: qty 1

## 2020-03-24 MED ORDER — VANCOMYCIN HCL 125 MG PO CAPS
125.0000 mg | ORAL_CAPSULE | Freq: Four times a day (QID) | ORAL | Status: DC
  Filled 2020-03-24: qty 1

## 2020-03-24 MED ORDER — PREDNISONE 5 MG PO TABS
15.0000 mg | ORAL_TABLET | Freq: Every day | ORAL | Status: DC
  Administered 2020-03-24 – 2020-03-25 (×2): 15 mg via ORAL
  Filled 2020-03-24 (×2): qty 1

## 2020-03-24 MED ORDER — MENTHOL 3 MG MT LOZG: 1.0000 | LOZENGE | OROMUCOSAL | Status: DC | PRN

## 2020-03-24 MED ORDER — TETANUS-DIPHTH-ACELL PERTUSSIS 5-2.5-18.5 LF-MCG/0.5 IM SUSP: 0.5000 mL | Freq: Once | INTRAMUSCULAR | Status: DC

## 2020-03-24 MED ORDER — KETOROLAC TROMETHAMINE 30 MG/ML IJ SOLN
30.0000 mg | Freq: Four times a day (QID) | INTRAMUSCULAR | Status: AC
  Administered 2020-03-24: 30 mg via INTRAVENOUS
  Filled 2020-03-24: qty 1

## 2020-03-24 MED ORDER — STERILE WATER FOR IRRIGATION IR SOLN
Status: DC | PRN
  Administered 2020-03-24: 1

## 2020-03-24 MED ORDER — ZOLPIDEM TARTRATE 5 MG PO TABS: 5.0000 mg | ORAL_TABLET | Freq: Every evening | ORAL | Status: DC | PRN

## 2020-03-24 MED ORDER — PRENATAL MULTIVITAMIN CH
1.0000 | ORAL_TABLET | Freq: Every day | ORAL | Status: DC
  Administered 2020-03-24: 1 via ORAL
  Filled 2020-03-24: qty 1

## 2020-03-24 MED ORDER — MAGNESIUM HYDROXIDE 400 MG/5ML PO SUSP: 30.0000 mL | ORAL | Status: DC | PRN

## 2020-03-24 NOTE — Progress Notes (Signed)
POSTPARTUM PROGRESS NOTE  POD #1  Subjective:  Joyce Solomon is a 34 y.o. [id number] s/p rLTCS x3 at [redacted]w[redacted]d  She reports she doing well. No acute events overnight. She has not yet ambulated, foley still in place. Denies nausea or vomiting. She has not passed flatus. Pain is moderately controlled.  Lochia is appropriate. Having some itching.  Objective: Blood pressure 108/62, pulse 94, temperature 98.9 F (37.2 C), temperature source Axillary, resp. rate 20, height 4' 11"  (1.499 m), weight 69.2 kg, last menstrual period 06/30/2019, SpO2 98 %, unknown if currently breastfeeding.  Physical Exam:  General: alert, cooperative and no distress Chest: no respiratory distress Heart:regular rate, distal pulses intact Abdomen: soft, nontender,  Uterine Fundus: firm, appropriately tender DVT Evaluation: No calf swelling or tenderness Extremities: no LE edema Skin: warm, dry; incision clean/dry/intact w/ pressure dressing in place  Recent Labs    03/23/20 2100  HGB 12.5  HCT 38.4    Assessment/Plan: Joyce DENUNZIOis a 34y.o. [id number]/p rLTCS x3 at 356w1dor SROM and SOL.  POD#1 - Doing welll; pain moderately controlled, will ask nurse to bring additional pain meds as well as meds for pruritus likely secondary to opioid medications. H/H appropriate  Routine postpartum care  OOB, ambulated  Lovenox for VTE prophylaxis Anemia: AM CBC pending, follow up Contraception: Nexplanon to be placed later today Feeding: breast and bottle  Ulcerative colitis: cont prednisone 1561maily, taper per GI (see note 03/09/2020)  C.diff colitis: Diagnosed on stool culture from 03/09/2020 and started treatment 03/13/2020, cont PO Vancomycin 125m71mh to end after 03/26/2020. No longer symptomatic, does not require precautions.   Dispo: Plan for discharge POD#2-3.   LOS: 1 day   MateAugustin Coupe/[facility] OB Fellow  03/24/2020, 6:38 AM

## 2020-03-24 NOTE — Lactation Note (Signed)
This note was copied from a baby's chart. Lactation Consultation Note  Patient Name: Joyce Solomon [id number] Date: 03/24/2020 Reason for consult: Follow-up assessment  LC Follow Up Visit:  Mother was changing baby when I returned for the follow up visit.  She stated baby fed for another 10 minutes after I left her room.  She was now ready to review the pump and begin pumping.  Further education provided and observed mother pumping using the #24 flange size.  This size is appropriate at this time.  Education reinforcement would benefit this mother.  Reminded her that she may not see colostrum immediately with pumping and the benefit of pumping is to stimulate the breasts.  Mother verbalized understanding.  She will feed back any EBM she obtains to baby.  Colostrum container provided and milk storage times reviewed.  Finger feeding demonstrated.    Mother will post pump after feedings for 15 minutes.  She is aware to feed at least every three hours and more often as baby desires.  Mother plans to continue supplementing with Joyce Solomon.  RN updated.   Maternal Data Formula Feeding for Exclusion: Yes Reason for exclusion: Mother's choice to formula and breast feed on admission Has patient been taught Hand Expression?: Yes Does the patient have breastfeeding experience prior to this delivery?: Yes  Feeding Feeding Type: Breast Fed Nipple Type: Slow - flow  LATCH Score Latch: Grasps breast easily, tongue down, lips flanged, rhythmical sucking.  Audible Swallowing: None  Type of Nipple: Everted at rest and after stimulation  Comfort (Breast/Nipple): Soft / non-tender  Hold (Positioning): Assistance needed to correctly position infant at breast and maintain latch.  LATCH Score: 7  Interventions Interventions: Breast feeding basics reviewed;Assisted with latch;Skin to skin;Breast massage;Hand express;Breast compression;Adjust position;DEBP;Position options;Support  pillows  Lactation Tools Discussed/Used WIC Program: Yes Pump Review: Setup, frequency, and cleaning;Milk Storage Initiated by:: Joyce Solomon Date initiated:: 03/24/20   Consult Status Consult Status: Follow-up Date: 03/25/20 Follow-up type: In-patient    Joyce Solomon 03/24/2020, 5:11 PM

## 2020-03-24 NOTE — Progress Notes (Signed)
W/ nurse to patient's room for Nexplanon insertion. Discussed risks, benefits, alternatives of nexplanon with pt and FOB. Pt still undecided about birth control and wants to wait until AM to decide, but says she's leaning more toward POPs or outpatient IUD. Discussed with pt that we could insert Nexplanon before she leaves and to please let nurse know when she has decided her method of BC. Patient acknowledged understanding.

## 2020-03-24 NOTE — Lactation Note (Addendum)
This note was copied from a baby's chart. Lactation Consultation Note  Patient Name: Joyce Solomon Today's Date: 03/24/2020 Reason for consult: Initial assessment;Early term 47-38.6wks  P4 mother whose infant is now 57 hours old.  This is an ETI at 38+1 weeks.  Mother breast fed her other three children for approximately 3 weeks each stating she never "got milk."  Mother's feeding preference on admission was breast/bottle.  Baby was asleep when I arrived.  Discussed breast feeding and educated mother on breast feeding basics.  She has been formula feeding and believes she "doesn't have anything" for baby.  Reviewed feeding cues and tummy size with mother.  Mother wants some affirmation that she has colostrum even though she cannot see any at this time.  She has some reservations about her supply.  Because of this, I offered to initiate the DEBP to help stimulate her breasts.  Mother interested.  Swaddled baby and obtained the pump supplies.  Pump parts, assembly, disassembly and cleaning reviewed.  When I finished reviewing the pump baby started to awaken.  Offered to assist with latching prior to pumping and mother interested.  Assisted baby to latch in the football hold on the right breast easily.  Baby started sucking well with a wide gape and flanged lips.  Demonstrated breast compressions and gentle stimulation to keep baby awake.  Baby was feeding well at six minutes.  Left mother to finish the feeding and I will return to review the pump again and make sure mother has no further questions.  Breast feeding supplementation guideline sheet reviewed with mother.  Mother does not have a DEBP for home use.  She plans to call the [facility] office in [place] to obtain a pump.  Informed mother that she will not be eligible if she chooses the formula package but encouraged her to call the office to discuss her options.  Mother will follow through with this.  RN updated.   Maternal Data Formula  Feeding for Exclusion: Yes Reason for exclusion: Mother's choice to formula and breast feed on admission Has patient been taught Hand Expression?: Yes Does the patient have breastfeeding experience prior to this delivery?: Yes  Feeding Feeding Type: Breast Fed Nipple Type: Slow - flow  LATCH Score Latch: Grasps breast easily, tongue down, lips flanged, rhythmical sucking.  Audible Swallowing: None  Type of Nipple: Everted at rest and after stimulation  Comfort (Breast/Nipple): Soft / non-tender  Hold (Positioning): Assistance needed to correctly position infant at breast and maintain latch.  LATCH Score: 7  Interventions Interventions: Breast feeding basics reviewed;Assisted with latch;Skin to skin;Breast massage;Hand express;Breast compression;Adjust position;DEBP;Position options;Support pillows  Lactation Tools Discussed/Used WIC Program: Yes Pump Review: Setup, frequency, and cleaning;Milk Storage Initiated by:: Jayah Balthazar Date initiated:: 03/24/20   Consult Status Consult Status: Follow-up Date: 03/25/20 Follow-up type: In-patient    Joyce Solomon 03/24/2020, 4:48 PM

## 2020-03-24 NOTE — Discharge Summary (Signed)
Postpartum Discharge Summary     Patient Name: Joyce Solomon DOB: 01/30/1986 MRN: [id number]  Date of admission: 03/23/2020 Delivering Provider: ANYANWU, UGONNA A   Date of discharge: 03/25/2020  Admitting diagnosis: History of 2 cesarean sections [Z98.891] Postpartum care following cesarean delivery [Z39.2] Intrauterine pregnancy: [redacted]w[redacted]d     Secondary diagnosis:  Principal Problem:   History of cesarean delivery affecting pregnancy Active Problems:   Ulcerative colitis (HCC)   Prior pregnancy complicated by SGA (small for gestational age), antepartum   Supervision of high risk pregnancy, antepartum   Group B Streptococcus carrier, +RV culture, currently pregnant   C. difficile diarrhea   History of 2 cesarean sections   Postpartum care following cesarean delivery  Additional problems: none     Discharge diagnosis: Term Pregnancy Delivered                                                                                                Post partum procedures:originally planned for Nexplanon placement prior to d/c, but when it came time for placement she refused on 2 separate occasions   Augmentation: n/a  Complications: None  Hospital course:  Onset of Labor With Unplanned C/S  33 y.o. yo [id number] at [redacted]w[redacted]d was admitted in Latent Labor on 03/23/2020. Patient had a labor course significant for: arrived to MAU for labor check but was unchanged from earlier in the day. Just prior to discharge had SROM for clear fluid and increasing cervical dilation. Membrane Rupture Time/Date: 8:40 PM ,03/23/2020   The patient went for cesarean section due to history of two prior cesareans, SROM, early labor, and delivered a Viable infant,03/23/2020   Details of operation can be found in separate operative note, overall uncomplicated.   Patient's postpartum course notable for the following:  #Ulcerative colitis Diagnosed with flare on 03/09/2020 by GI and started on prednisone burst and taper.  Initially started on 20mg x2 weeks and had tapered to 15mg by time of admission, with plan to decrease by 5mg q2 weeks (see note from 03/09/2020 for more details).   #C.diff colitis At time of evaluation by GI also evaluated for colitis and found to have +Cdiff antigens on stool sample. She started treatment with PO Vanc on 03/13/2020 and was asymptomatic by the time of admission. She was continued on PO Vanc while admitted.   At time of discharge she is ambulating,tolerating a regular diet, passing flatus, and urinating well. Patient is discharged home in stable condition 03/25/20 per her request for early discharge as long as baby can go too.  Delivery time: 11:28 PM    Magnesium Sulfate received: No BMZ received: No Rhophylac:N/A MMR:N/A Transfusion:No  Physical exam  Vitals:   03/24/20 1121 03/24/20 1455 03/24/20 2102 03/25/20 0627  BP: 112/60 125/70 109/63 109/71  Pulse: 70 100 90 97  Resp: 18 17 16 17  Temp: 98 F (36.7 C) 98 F (36.7 C) 98.5 F (36.9 C) 97.7 F (36.5 C)  TempSrc: Oral Oral Oral Oral  SpO2:   100% 100%  Weight:      Height:       General:   alert and cooperative Lochia: appropriate Uterine Fundus: firm Incision: honeycomb intact, stained but unchanged DVT Evaluation: No evidence of DVT seen on physical exam. Labs: Lab Results  Component Value Date   WBC 9.6 03/24/2020   HGB 10.4 (L) 03/24/2020   HCT 31.2 (L) 03/24/2020   MCV 87.9 03/24/2020   PLT 248 03/24/2020   CMP Latest Ref Rng & Units 03/25/2020  Glucose 70 - 99 mg/dL -  BUN 6 - 20 mg/dL -  Creatinine 0.44 - 1.00 mg/dL 0.52  Sodium 135 - 145 mmol/L -  Potassium 3.5 - 5.1 mmol/L -  Chloride 98 - 111 mmol/L -  CO2 22 - 32 mmol/L -  Calcium 8.9 - 10.3 mg/dL -  Total Protein 6.5 - 8.1 g/dL -  Total Bilirubin 0.3 - 1.2 mg/dL -  Alkaline Phos 38 - 126 U/L -  AST 15 - 41 U/L -  ALT 0 - 44 U/L -   Edinburgh Score: Edinburgh Postnatal Depression Scale Screening Tool 03/25/2020  I have been  able to laugh and see the funny side of things. 0  I have looked forward with enjoyment to things. 0  I have blamed myself unnecessarily when things went wrong. 0  I have been anxious or worried for no good reason. 0  I have felt scared or panicky for no good reason. 0  Things have been getting on top of me. 1  I have been so unhappy that I have had difficulty sleeping. 0  I have felt sad or miserable. 0  I have been so unhappy that I have been crying. 1  The thought of harming myself has occurred to me. 0  Edinburgh Postnatal Depression Scale Total 2    Discharge instruction: per After Visit Summary and "Baby and Me Booklet".  After visit meds:  Allergies as of 03/25/2020      Reactions   Latex Rash   Remicade [infliximab] Itching      Medication List    STOP taking these medications   acetaminophen 325 MG tablet Commonly known as: TYLENOL   Blood Pressure Kit [place] Supp Med Misc   cyclobenzaprine 10 MG tablet Commonly known as: FLEXERIL     TAKE these medications   ibuprofen 800 MG tablet Commonly known as: ADVIL Take 1 tablet (800 mg total) by mouth every 8 (eight) hours as needed.   oxyCODONE 5 MG immediate release tablet Commonly known as: Oxy IR/ROXICODONE Take 1-2 tablets (5-10 mg total) by mouth every 4 (four) hours as needed for moderate pain.   PNV PO Take 1 tablet by mouth daily.   predniSONE 5 MG tablet Commonly known as: DELTASONE Take 4 tablets (20 mg total) by mouth daily with breakfast for 14 days, THEN 3 tablets (15 mg total) daily with breakfast for 14 days, THEN 2 tablets (10 mg total) daily with breakfast for 14 days, THEN 1 tablet (5 mg total) daily with breakfast for 14 days. Start taking on: March 04, 2020   vancomycin 125 MG capsule Commonly known as: Vancocin HCl Take 1 capsule (125 mg total) by mouth every 6 (six) hours for 14 days.       Diet: routine diet  Activity: Advance as tolerated. Pelvic rest for 6  weeks.   Outpatient follow [id number] check in 1wk; PP visit in 4-6wks Follow up Appt: Future Appointments  Date Time Provider [place]  04/06/2020  1:10 PM [place] None  04/21/2020 11:00 AM Roselie Awkward,  James G, MD [facility] None   Follow up Visit: Follow-up Information    [facility] Follow up.   Specialty: Obstetrics and Gynecology Contact information: [address], [address] [place] [address] [phone]          Please schedule this patient for Postpartum visit in: 6 weeks with the following provider: Any provider Virtual For C/S patients schedule nurse incision check in weeks 2 weeks: yes High risk pregnancy complicated by: Ulcerative colitis with flare Delivery mode:  CS Anticipated Birth Control:  PP Nexplanon placed- declined on 2 separate days PP Procedures needed: Incision check  Schedule Integrated BH visit: no    Newborn Data: Live born female  Birth Weight:  2767gm (6lb 1.6oz) APGAR: 8, 9  Newborn Delivery   Birth date/time: 03/23/2020 23:28:00 Delivery type: C-Section, Low Transverse Trial of labor: No C-section categorization: Repeat      Baby Feeding: Bottle and Breast Disposition:home with mother   03/25/2020  D , CNM  9:34 AM    

## 2020-03-24 NOTE — Anesthesia Postprocedure Evaluation (Signed)
Anesthesia Post Note  Patient: Joyce Solomon  Procedure(s) Performed: CESAREAN SECTION (N/A )     Patient location during evaluation: PACU Anesthesia Type: Spinal Level of consciousness: awake and alert and oriented Pain management: pain level controlled Vital Signs Assessment: post-procedure vital signs reviewed and stable Respiratory status: spontaneous breathing, nonlabored ventilation and respiratory function stable Cardiovascular status: blood pressure returned to baseline Postop Assessment: no apparent nausea or vomiting, spinal receding, no headache and no backache Anesthetic complications: no    Last Vitals:  Vitals:   03/24/20 0030 03/24/20 0033  BP: (!) 71/57 (!) 86/61  Pulse: 80 96  Resp: 13 20  Temp:    SpO2: 100% 94%    Last Pain:  Vitals:   03/24/20 0030  TempSrc:   PainSc: 0-No pain   Pain Goal:    LLE Motor Response: Purposeful movement (03/24/20 0030) LLE Sensation: Tingling (03/24/20 0030) RLE Motor Response: Purposeful movement (03/24/20 0030) RLE Sensation: Tingling (03/24/20 0030)     Epidural/Spinal Function Cutaneous sensation: Tingles (03/24/20 0030), Patient able to flex knees: No (03/24/20 0030), Patient able to lift hips off bed: No (03/24/20 0030), Back pain beyond tenderness at insertion site: No (03/24/20 0030), Progressively worsening motor and/or sensory loss: No (03/24/20 0030), Bowel and/or bladder incontinence post epidural: No (03/24/20 0030)  Brennan Bailey

## 2020-03-24 NOTE — Transfer of Care (Signed)
Immediate Anesthesia Transfer of Care Note  Patient: Joyce Solomon  Procedure(s) Performed: CESAREAN SECTION (N/A )  Patient Location: PACU  Anesthesia Type:Spinal  Level of Consciousness: awake, alert  and oriented  Airway & Oxygen Therapy: Patient Spontanous Breathing  Post-op Assessment: Report given to RN and Post -op Vital signs reviewed and stable  Post vital signs: Reviewed and stable  Last Vitals:  Vitals Value Taken Time  BP 80/53 03/24/20 0026  Temp    Pulse 78 03/24/20 0031  Resp 19 03/24/20 0031  SpO2 100 % 03/24/20 0031  Vitals shown include unvalidated device data.  Last Pain:  Vitals:   03/23/20 1922  TempSrc: Oral  PainSc: 10-Worst pain ever         Complications: No apparent anesthesia complications

## 2020-03-25 ENCOUNTER — Other Ambulatory Visit: Payer: Self-pay | Admitting: Advanced Practice Midwife

## 2020-03-25 ENCOUNTER — Encounter (HOSPITAL_COMMUNITY): Payer: Self-pay | Admitting: Obstetrics & Gynecology

## 2020-03-25 LAB — BIRTH TISSUE RECOVERY COLLECTION (PLACENTA DONATION)

## 2020-03-25 LAB — CREATININE, SERUM
Creatinine, Ser: 0.52 mg/dL (ref 0.44–1.00)
GFR calc Af Amer: >60 mL/min (ref >60)
GFR calc non Af Amer: >60 mL/min (ref >60)

## 2020-03-25 MED ORDER — LIDOCAINE-EPINEPHRINE 1 %-1:100000 IJ SOLN
10.0000 mL | Freq: Once | INTRAMUSCULAR | Status: DC
  Filled 2020-03-25 (×2): qty 10

## 2020-03-25 MED ORDER — LIDOCAINE HCL 1 % IJ SOLN
INTRAMUSCULAR | Status: AC
  Filled 2020-03-25: qty 20

## 2020-03-25 MED ORDER — IBUPROFEN 800 MG PO TABS: 800.0000 mg | ORAL_TABLET | Freq: Three times a day (TID) | ORAL | 0 refills | Status: DC | PRN

## 2020-03-25 MED ORDER — OXYCODONE HCL 5 MG PO TABS: 5.0000 mg | ORAL_TABLET | Freq: Four times a day (QID) | ORAL | 0 refills | Status: DC | PRN

## 2020-03-25 MED ORDER — OXYCODONE HCL 5 MG PO TABS: 5.0000 mg | ORAL_TABLET | ORAL | 0 refills | Status: DC | PRN

## 2020-03-25 NOTE — Discharge Instructions (Signed)
Etonogestrel implant What is this medicine? ETONOGESTREL (et oh noe JES trel) is a contraceptive (birth control) device. It is used to prevent pregnancy. It can be used for up to 3 years. This medicine may be used for other purposes; ask your health care provider or pharmacist if you have questions. COMMON BRAND NAME(S): Implanon, Nexplanon What should I tell my health care provider before I take this medicine? They need to know if you have any of these conditions:  abnormal vaginal bleeding  blood vessel disease or blood clots  breast, cervical, endometrial, ovarian, liver, or uterine cancer  diabetes  gallbladder disease  heart disease or recent heart attack  high blood pressure  high cholesterol or triglycerides  kidney disease  liver disease  migraine headaches  seizures  stroke  tobacco smoker  an unusual or allergic reaction to etonogestrel, anesthetics or antiseptics, other medicines, foods, dyes, or preservatives  pregnant or trying to get pregnant  breast-feeding How should I use this medicine? This device is inserted just under the skin on the inner side of your upper arm by a health care professional. Talk to your pediatrician regarding the use of this medicine in children. Special care may be needed. Overdosage: If you think you have taken too much of this medicine contact a poison control center or emergency room at once. NOTE: This medicine is only for you. Do not share this medicine with others. What if I miss a dose? This does not apply. What may interact with this medicine? Do not take this medicine with any of the following medications:  amprenavir  fosamprenavir This medicine may also interact with the following medications:  acitretin  aprepitant  armodafinil  bexarotene  bosentan  carbamazepine  certain medicines for fungal infections like fluconazole, ketoconazole, itraconazole and voriconazole  certain medicines to treat  hepatitis, HIV or AIDS  cyclosporine  felbamate  griseofulvin  lamotrigine  modafinil  oxcarbazepine  phenobarbital  phenytoin  primidone  rifabutin  rifampin  rifapentine  St. John's wort  topiramate This list may not describe all possible interactions. Give your health care provider a list of all the medicines, herbs, non-prescription drugs, or dietary supplements you use. Also tell them if you smoke, drink alcohol, or use illegal drugs. Some items may interact with your medicine. What should I watch for while using this medicine? This product does not protect you against HIV infection (AIDS) or other sexually transmitted diseases. You should be able to feel the implant by pressing your fingertips over the skin where it was inserted. Contact your doctor if you cannot feel the implant, and use a non-hormonal birth control method (such as condoms) until your doctor confirms that the implant is in place. Contact your doctor if you think that the implant may have broken or become bent while in your arm. You will receive a user card from your health care provider after the implant is inserted. The card is a record of the location of the implant in your upper arm and when it should be removed. Keep this card with your health records. What side effects may I notice from receiving this medicine? Side effects that you should report to your doctor or health care professional as soon as possible:  allergic reactions like skin rash, itching or hives, swelling of the face, lips, or tongue  breast lumps, breast tissue changes, or discharge  breathing problems  changes in emotions or moods  coughing up blood  if you feel that the implant  may have broken or bent while in your arm  high blood pressure  pain, irritation, swelling, or bruising at the insertion site  scar at site of insertion  signs of infection at the insertion site such as fever, and skin redness, pain or  discharge  signs and symptoms of a blood clot such as breathing problems; changes in vision; chest pain; severe, sudden headache; pain, swelling, warmth in the leg; trouble speaking; sudden numbness or weakness of the face, arm or leg  signs and symptoms of liver injury like dark yellow or brown urine; general ill feeling or flu-like symptoms; light-colored stools; loss of appetite; nausea; right upper belly pain; unusually weak or tired; yellowing of the eyes or skin  unusual vaginal bleeding, discharge Side effects that usually do not require medical attention (report to your doctor or health care professional if they continue or are bothersome):  acne  breast pain or tenderness  headache  irregular menstrual bleeding  nausea This list may not describe all possible side effects. Call your doctor for medical advice about side effects. You may report side effects to FDA at 1-800-FDA-1088. Where should I keep my medicine? This drug is given in a hospital or clinic and will not be stored at home. NOTE: This sheet is a summary. It may not cover all possible information. If you have questions about this medicine, talk to your doctor, pharmacist, or health care provider.  2020 Elsevier/Gold Standard (2019-08-19 11:33:04) Cesarean Delivery, Care After This sheet gives you information about how to care for yourself after your procedure. Your health care provider may also give you more specific instructions. If you have problems or questions, contact your health care provider. What can I expect after the procedure? After the procedure, it is common to have:  A small amount of blood or clear fluid coming from the incision.  Some redness, swelling, and pain in your incision area.  Some abdominal pain and soreness.  Vaginal bleeding (lochia). Even though you did not have a vaginal delivery, you will still have vaginal bleeding and discharge.  Pelvic cramps.  Fatigue. You may have pain,  swelling, and discomfort in the tissue between your vagina and your anus (perineum) if:  Your C-section was unplanned, and you were allowed to labor and push.  An incision was made in the area (episiotomy) or the tissue tore during attempted vaginal delivery. Follow these instructions at home: Incision care   Follow instructions from your health care provider about how to take care of your incision. Make sure you: ? Wash your hands with soap and water before you change your bandage (dressing). If soap and water are not available, use hand sanitizer. ? If you have a dressing, change it or remove it as told by your health care provider. ? Leave stitches (sutures), skin staples, skin glue, or adhesive strips in place. These skin closures may need to stay in place for 2 weeks or longer. If adhesive strip edges start to loosen and curl up, you may trim the loose edges. Do not remove adhesive strips completely unless your health care provider tells you to do that.  Check your incision area every day for signs of infection. Check for: ? More redness, swelling, or pain. ? More fluid or blood. ? Warmth. ? Pus or a bad smell.  Do not take baths, swim, or use a hot tub until your health care provider says it's okay. Ask your health care provider if you can take showers.  When you cough or sneeze, hug a pillow. This helps with pain and decreases the chance of your incision opening up (dehiscing). Do this until your incision heals. Medicines  Take over-the-counter and prescription medicines only as told by your health care provider.  If you were prescribed an antibiotic medicine, take it as told by your health care provider. Do not stop taking the antibiotic even if you start to feel better.  Do not drive or use heavy machinery while taking prescription pain medicine. Lifestyle  Do not drink alcohol. This is especially important if you are breastfeeding or taking pain medicine.  Do not use any  products that contain nicotine or tobacco, such as cigarettes, e-cigarettes, and chewing tobacco. If you need help quitting, ask your health care provider. Eating and drinking  Drink at least 8 eight-ounce glasses of water every day unless told not to by your health care provider. If you breastfeed, you may need to drink even more water.  Eat high-fiber foods every day. These foods may help prevent or relieve constipation. High-fiber foods include: ? Whole grain cereals and breads. ? Brown rice. ? Beans. ? Fresh fruits and vegetables. Activity   If possible, have someone help you care for your baby and help with household activities for at least a few days after you leave the hospital.  Return to your normal activities as told by your health care provider. Ask your health care provider what activities are safe for you.  Rest as much as possible. Try to rest or take a nap while your baby is sleeping.  Do not lift anything that is heavier than 10 lbs (4.5 kg), or the limit that you were told, until your health care provider says that it is safe.  Talk with your health care provider about when you can engage in sexual activity. This may depend on your: ? Risk of infection. ? How fast you heal. ? Comfort and desire to engage in sexual activity. General instructions  Do not use tampons or douches until your health care provider approves.  Wear loose, comfortable clothing and a supportive and well-fitting bra.  Keep your perineum clean and dry. Wipe from front to back when you use the toilet.  If you pass a blood clot, save it and call your health care provider to discuss. Do not flush blood clots down the toilet before you get instructions from your health care provider.  Keep all follow-up visits for you and your baby as told by your health care provider. This is important. Contact a health care provider if:  You have: ? A fever. ? Bad-smelling vaginal discharge. ? Pus or a bad  smell coming from your incision. ? Difficulty or pain when urinating. ? A sudden increase or decrease in the frequency of your bowel movements. ? More redness, swelling, or pain around your incision. ? More fluid or blood coming from your incision. ? A rash. ? Nausea. ? Little or no interest in activities you used to enjoy. ? Questions about caring for yourself or your baby.  Your incision feels warm to the touch.  Your breasts turn red or become painful or hard.  You feel unusually sad or worried.  You vomit.  You pass a blood clot from your vagina.  You urinate more than usual.  You are dizzy or light-headed. Get help right away if:  You have: ? Pain that does not go away or get better with medicine. ? Chest pain. ? Difficulty breathing. ?  Blurred vision or spots in your vision. ? Thoughts about hurting yourself or your baby. ? New pain in your abdomen or in one of your legs. ? A severe headache.  You faint.  You bleed from your vagina so much that you fill more than one sanitary pad in one hour. Bleeding should not be heavier than your heaviest period. Summary  After the procedure, it is common to have pain at your incision site, abdominal cramping, and slight bleeding from your vagina.  Check your incision area every day for signs of infection.  Tell your health care provider about any unusual symptoms.  Keep all follow-up visits for you and your baby as told by your health care provider. This information is not intended to replace advice given to you by your health care provider. Make sure you discuss any questions you have with your health care provider. Document Revised: 05/15/2018 Document Reviewed: 05/15/2018 Elsevier Patient Education  [place].

## 2020-03-25 NOTE — Progress Notes (Addendum)
Went to bedside to perform Nexplanon insertion, after patient refused 5/5. Patient refused saying that her skin was too tender and she was worried that the nexplanon would be lost under the skin. I assured her the risk of "losing" the nexplanon to deeper parts of the skin was miniscule, however she refused again. We then discussed other forms of BC, including POPs, IUDs, and Depo. Hx of Depo shots, however she didn't like them because of the increase in vaginal bleeding. Also does not want POPs because she will forget to take them at the same time daily. Interested in IUD outpatient but will not commit to [facility] here inpatient. Advised patient of risk of pregnancy despite breastfeeding and advised patient to use condoms in the meantime. Patient verbalized understanding the risks.

## 2020-03-25 NOTE — Progress Notes (Signed)
Received verbal permission from Dr. Rinaldo Cloud to allow the Pharmacist from [facility] on [address] to change pt's take home prescription from 1-2 tabs of 52m oxycodone every 4 hours to 1 tab of 559moxycodone every 4 hours.

## 2020-03-25 NOTE — Progress Notes (Signed)
Oxycodone d/c'd from (closed now) [facility] and new rx sent to [facility]

## 2020-03-26 ENCOUNTER — Encounter: Payer: Medicaid Other | Admitting: Obstetrics and Gynecology

## 2020-04-06 ENCOUNTER — Other Ambulatory Visit: Payer: Self-pay

## 2020-04-06 ENCOUNTER — Ambulatory Visit (INDEPENDENT_AMBULATORY_CARE_PROVIDER_SITE_OTHER): Payer: Medicaid Other

## 2020-04-06 VITALS — BP 123/82 | HR 86 | Ht 59.0 in | Wt 132.0 lb

## 2020-04-06 DIAGNOSIS — Z5189 Encounter for other specified aftercare: Secondary | ICD-10-CM

## 2020-04-06 NOTE — Progress Notes (Addendum)
Subjective:     Joyce Solomon is a 34 y.o. female who presents to the clinic 2 weeks status post LTCS for Incision Check. Eating a regular diet without difficulty. Bowel movements are normal. Patient wanted to switch from Ibuprofen. She was advised to get Tylenol Extended Release tablets.    Objective:    BP 123/82   Pulse 86   Ht 4' 11"  (1.499 m)   Wt 132 lb (59.9 kg)   LMP 06/30/2019 (Exact Date)   Breastfeeding Yes   BMI 26.66 kg/m  General:  alert  Abdomen: soft, bowel sounds active, non-tender  Incision:   healing well, no drainage, no erythema, no hernia, no seroma, no swelling, no dehiscence, incision well approximated     Assessment:    Doing well postoperatively.   Plan:    1. Continue any current medications. 2. Wound care discussed. 3. Activity restrictions: none 4. Anticipated return to work: not applicable. 5. Follow up: 2 weeks for Postpartum Visit.   Tamela Oddi, RMA   Chart reviewed for nurse visit. Agree with plan of care.   Virginia Rochester, NP 04/06/2020 5:08 PM

## 2020-04-08 ENCOUNTER — Telehealth: Payer: Self-pay | Admitting: Gastroenterology

## 2020-04-08 NOTE — Telephone Encounter (Signed)
She will benefit to go back on Biologics.  Please schedule her to come in for office visit tomorrow or Monday to discuss treatment plan.  Please advise her to discuss with her baby's pediatrician to make sure prednisone is not contraindicated if she is breast-feeding.  Thank you

## 2020-04-08 NOTE — Telephone Encounter (Signed)
Patient says she started back on prednisone last week. She is vague on how many days now. She does know she has been taking 3 tablets a day since sometime last week. Med list shows prednisone 5 mg. She has diarrhea and abdominal discomfort that feels the same as it did last week. She does not feel it has improved any since last week.

## 2020-04-09 ENCOUNTER — Ambulatory Visit (INDEPENDENT_AMBULATORY_CARE_PROVIDER_SITE_OTHER): Payer: Medicaid Other | Admitting: Gastroenterology

## 2020-04-09 ENCOUNTER — Encounter: Payer: Self-pay | Admitting: Gastroenterology

## 2020-04-09 ENCOUNTER — Other Ambulatory Visit (INDEPENDENT_AMBULATORY_CARE_PROVIDER_SITE_OTHER): Payer: Medicaid Other

## 2020-04-09 ENCOUNTER — Telehealth: Payer: Self-pay | Admitting: Gastroenterology

## 2020-04-09 VITALS — BP 106/64 | HR 100 | Ht 59.0 in | Wt 133.2 lb

## 2020-04-09 DIAGNOSIS — K51911 Ulcerative colitis, unspecified with rectal bleeding: Secondary | ICD-10-CM | POA: Diagnosis not present

## 2020-04-09 DIAGNOSIS — R197 Diarrhea, unspecified: Secondary | ICD-10-CM

## 2020-04-09 LAB — CBC WITH DIFFERENTIAL/PLATELET
Basophils Absolute: 0 10*3/uL (ref 0.0–0.1)
Basophils Relative: 0.2 % (ref 0.0–3.0)
Eosinophils Absolute: 0.2 10*3/uL (ref 0.0–0.7)
Eosinophils Relative: 1.8 % (ref 0.0–5.0)
HCT: 39 % (ref 36.0–46.0)
Hemoglobin: 13 g/dL (ref 12.0–15.0)
Lymphocytes Relative: 16.8 % (ref 12.0–46.0)
Lymphs Abs: 1.5 10*3/uL (ref 0.7–4.0)
MCHC: 33.3 g/dL (ref 30.0–36.0)
MCV: 86.4 fl (ref 78.0–100.0)
Monocytes Absolute: 1 10*3/uL (ref 0.1–1.0)
Monocytes Relative: 10.8 % (ref 3.0–12.0)
Neutro Abs: 6.5 10*3/uL (ref 1.4–7.7)
Neutrophils Relative %: 70.4 % (ref 43.0–77.0)
Platelets: 402 10*3/uL — ABNORMAL HIGH (ref 150.0–400.0)
RBC: 4.52 Mil/uL (ref 3.87–5.11)
RDW: 16 % — ABNORMAL HIGH (ref 11.5–15.5)
WBC: 9.2 10*3/uL (ref 4.0–10.5)

## 2020-04-09 LAB — COMPREHENSIVE METABOLIC PANEL
ALT: 6 U/L (ref 0–35)
AST: 8 U/L (ref 0–37)
Albumin: 3.9 g/dL (ref 3.5–5.2)
Alkaline Phosphatase: 92 U/L (ref 39–117)
BUN: 10 mg/dL (ref 6–23)
CO2: 25 mEq/L (ref 19–32)
Calcium: 9.1 mg/dL (ref 8.4–10.5)
Chloride: 105 mEq/L (ref 96–112)
Creatinine, Ser: 0.56 mg/dL (ref 0.40–1.20)
GFR: 149.59 mL/min (ref >60.00)
Glucose, Bld: 125 mg/dL — ABNORMAL HIGH (ref 70–99)
Potassium: 2.8 mEq/L — CL (ref 3.5–5.1)
Sodium: 136 mEq/L (ref 135–145)
Total Bilirubin: 0.2 mg/dL (ref 0.2–1.2)
Total Protein: 7.1 g/dL (ref 6.0–8.3)

## 2020-04-09 LAB — IBC + FERRITIN
Ferritin: 20.9 ng/mL (ref 10.0–291.0)
Iron: 27 ug/dL — ABNORMAL LOW (ref 42–145)
Saturation Ratios: 7.3 % — ABNORMAL LOW (ref 20.0–50.0)
Transferrin: 264 mg/dL (ref 212.0–360.0)

## 2020-04-09 LAB — HIGH SENSITIVITY CRP: CRP, High Sensitivity: 10.32 mg/L — ABNORMAL HIGH (ref 0.000–5.000)

## 2020-04-09 LAB — SEDIMENTATION RATE: Sed Rate: 49 mm/hr — ABNORMAL HIGH (ref 0–20)

## 2020-04-09 MED ORDER — TRAMADOL HCL 50 MG PO TABS: 50.0000 mg | ORAL_TABLET | Freq: Three times a day (TID) | ORAL | 0 refills | Status: DC | PRN

## 2020-04-09 MED ORDER — POTASSIUM CHLORIDE ER 10 MEQ PO TBCR: 10.0000 meq | EXTENDED_RELEASE_TABLET | Freq: Every day | ORAL | 0 refills | Status: DC

## 2020-04-09 MED ORDER — OMEPRAZOLE 20 MG PO CPDR: 20.0000 mg | DELAYED_RELEASE_CAPSULE | Freq: Every day | ORAL | 3 refills | Status: DC

## 2020-04-09 MED ORDER — PREDNISONE 5 MG PO TABS
ORAL_TABLET | ORAL | 0 refills | Status: DC
Start: 2020-04-09 — End: 2020-06-17

## 2020-04-09 NOTE — Telephone Encounter (Signed)
Patient will come for an office visit 04/09/20 at 3:00pm.

## 2020-04-09 NOTE — Progress Notes (Addendum)
Joyce Solomon    [id number]    [birth year]  Primary Care Physician:Pa, [facility]  Referring Physician: Pa, [place] [address] [place],  [place] [address]   Chief complaint: Diarrhea, rectal bleeding, abdominal pain HPI:  34 year old female with history of ulcerative colitis here for follow-up visit with complaints of worsening diarrhea and rectal bleeding S/p C-section 03/23/2020.  Last office visit 03/04/2020 for acute ulcerative colitis flare.  She was started on prednisone taper, symptoms somewhat improved while she was taking prednisone which she was taking until the day of delivery. Postpartum she did not start taking prednisone until last week, is currently on 5 mg daily  She is having 10-15 loose watery bowel movements a day, also has nocturnal symptoms, wakes up from sleep with watery diarrhea and has intermittent blood mixed in stool Complains of diffuse abdominal pain and cramping.  Pain is worse in the lower abdomen near her C-section scar.   Relevant GI history: She was in clinical remission on Humira, self discontinued after she became pregnant, restarted in Nov 2020 and she stopped it again after 3 months as she thought it was not helping  Colonoscopy Apr 10, 2018 negative for active colitis, chronic mucosal changes with altered vascularity and mild erythema. Random biopsies negative for active inflammation.  Drug trough 5.9 with undetectable antibody for Humira   Colonoscopy June 2015 showed moderately severe left-sided colitis  Allergic reaction to Remicade in 2016. Elevated antibody level to infliximab 1622 and undetectable drug level  Flexible sigmoidoscopy January 2017 showed severe colitis left colon with ulceration, biopsies showed moderately active colitis.  Started on Humira March 2017, and clinical remission since then    Outpatient Encounter Medications as of 04/09/2020  Medication Sig  . predniSONE (DELTASONE)  5 MG tablet Take 4 tablets (20 mg total) by mouth daily with breakfast for 14 days, THEN 3 tablets (15 mg total) daily with breakfast for 14 days, THEN 2 tablets (10 mg total) daily with breakfast for 14 days, THEN 1 tablet (5 mg total) daily with breakfast for 14 days.  [name] oxyCODONE (ROXICODONE) 5 MG immediate release tablet Take 1 tablet (5 mg total) by mouth every 6 (six) hours as needed for severe pain. (Patient not taking: Reported on 04/06/2020)  . Prenatal Vit w/Fe-Methylfol-FA (PNV PO) Take 1 tablet by mouth daily.   . [DISCONTINUED] HUMIRA PEN 40 MG/0.4ML PNKT INJECT 40 MG INTO THE SKIN EVERY 14 (FOURTEEN) DAYS.  . [DISCONTINUED] ibuprofen (ADVIL) 800 MG tablet Take 1 tablet (800 mg total) by mouth every 8 (eight) hours as needed. (Patient not taking: Reported on 04/09/2020)   No facility-administered encounter medications on file as of 04/09/2020.    Allergies as of 04/09/2020 - Review Complete 04/09/2020  Allergen Reaction Noted  . Latex Rash 05/20/2009  . Remicade [infliximab] Itching 08/07/2015    Past Medical History:  Diagnosis Date  . Allergy    SEASONAL  . Blood transfusion without reported diagnosis   . Headache   . Iron Deficiency Anemia 2008   s/p transfusions 2009, 2010; inconsistent compliance with PO iron.   . Non compliance w medication regimen 06/2015   stopped Remicade, may not have been taking Azulfidine.  . Thyrotoxicosis   . Ulcerative Colitis 2007   on Humira    Past Surgical History:  Procedure Laterality Date  . CESAREAN SECTION N/A 12/09/2013   Procedure: Primary Cesarean Section Delivery Baby Girl @ 1959, Apgars 8/8;  Surgeon: Logan Bores, MD;  Location: [facility];  Service: Obstetrics;  Laterality: N/A;  . CESAREAN SECTION N/A 08/14/2017   Procedure: CESAREAN SECTION;  Surgeon: Paula Compton, MD;  Location: [facility];  Service: Obstetrics;  Laterality: N/A;  . CESAREAN SECTION N/A 03/23/2020   Procedure: CESAREAN SECTION;  Surgeon:  Osborne Oman, MD;  Location: [facility];  Service: Obstetrics;  Laterality: N/A;  . COLONOSCOPY  01/19/2011   Dr Penelope Coop. no active UC  . COLONOSCOPY W/ BIOPSIES N/A 04/2014   Dr Deatra Ina: Left sided colitis from prox descending to rectum; no inflammation at or proximal to splenic flexure  . DILATION AND EVACUATION  08/21/2012   Procedure: DILATATION AND EVACUATION;  Surgeon: Logan Bores, MD;  Location: [facility];  Service: Gynecology  . TONSILLECTOMY      Family History  Problem Relation Age of Onset  . Miscarriages / Stillbirths Other   . Diabetes Sister   . Hypotension Neg Hx   . Alcohol abuse Neg Hx   . Arthritis Neg Hx   . Asthma Neg Hx   . Birth defects Neg Hx   . COPD Neg Hx   . Depression Neg Hx   . Drug abuse Neg Hx   . Hearing loss Neg Hx   . Heart disease Neg Hx   . Hyperlipidemia Neg Hx   . Hypertension Neg Hx   . Kidney disease Neg Hx   . Learning disabilities Neg Hx   . Mental illness Neg Hx   . Mental retardation Neg Hx   . Colon cancer Neg Hx   . Colon polyps Neg Hx   . Esophageal cancer Neg Hx   . Stomach cancer Neg Hx   . Rectal cancer Neg Hx     Social History   Socioeconomic History  . Marital status: Single    Spouse name: Not on file  . Number of children: 2  . Years of education: Not on file  . Highest education level: Not on file  Occupational History  . Occupation: unemployed  Tobacco Use  . Smoking status: Former Smoker    Years: 3.00    Quit date: 12/13/2018    Years since quitting: 1.3  . Smokeless tobacco: Never Used  . Tobacco comment: smokes a cig "every blue moon"  Substance and Sexual Activity  . Alcohol use: Not Currently    Alcohol/week: 0.0 standard drinks    Comment: occassional  . Drug use: No  . Sexual activity: Yes    Birth control/protection: None  Other Topics Concern  . Not on file  Social History Narrative  . Not on file   Social Determinants of Health   Financial Resource Strain:   . Difficulty of  Paying Living Expenses:   Food Insecurity:   . Worried About Charity fundraiser in the Last Year:   . Arboriculturist in the Last Year:   Transportation Needs:   . Film/video editor (Medical):   [name] Lack of Transportation (Non-Medical):   Physical Activity:   . Days of Exercise per Week:   . Minutes of Exercise per Session:   Stress:   . Feeling of Stress :   Social Connections:   . Frequency of Communication with Friends and Family:   . Frequency of Social Gatherings with Friends and Family:   . Attends Religious Services:   . Active Member of Clubs or Organizations:   . Attends Archivist Meetings:   .  Marital Status:   Intimate Partner Violence:   . Fear of Current or Ex-Partner:   . Emotionally Abused:   [name] Physically Abused:   . Sexually Abused:       Review of systems: All other review of systems negative except as mentioned in the HPI.   Physical Exam: Vitals:   04/09/20 1515  BP: 106/64  Pulse: 100  SpO2: 97%   Body mass index is 26.9 kg/m. Gen:      No acute distress Abd:      + bowel sounds; soft, lower abdominal tenderness +, surgical scar C-section; no palpable masses, no distension Ext:    No edema; adequate peripheral perfusion Skin:      Warm and dry; no rash Neuro: alert and oriented x 3 Psych: normal mood and affect  Data Reviewed:  Reviewed labs, radiology imaging, old records and pertinent past GI work up   Assessment and Plan/Recommendations:  34 year old female 2 weeks postpartum s/p C-section Mar 23, 2020, history of left-sided ulcerative colitis with acute flare of symptoms  Acute UC flare Check GI pathogen panel to exclude infectious etiology/ C. Difficile Check CBC, CMP, iron panel, B12, folate, ESR and CRP  Start prednisone 20 mg daily X 4 weeks followed by 5 mg taper every 2 weeks Will plan to start Entyvio [biologic] to achieve clinical remission, awaiting insurance approval Failed Humira/noncompliance and history  of allergic reaction to Remicade  Entyvio is less likely to be secreted in breast milk, no reported adverse reaction with breast-feeding  GERD: Start Prilosec 20 mg daily and antireflux measures  Generalized abdominal pain and discomfort: Tramadol 50 mg every 8 hours as needed X 30 tablets with no refills Advised patient to minimize the use especially with breast-feeding to prevent any adverse reaction to the baby as small amounts can be secreted in the breastmilk  [facility] pediatrics to discuss about treatment regimen given patient is breast-feeding, to make sure no concerns from pediatrician's standpoint  Return in 2 to 4 weeks, call with any change or worsening symptoms   This visit required 45 minutes of patient care (this includes precharting, chart review, review of results, face-to-face time used for counseling as well as treatment plan and follow-up. The patient was provided an opportunity to ask questions and all were answered. The patient agreed with the plan and demonstrated an understanding of the instructions.  Damaris Hippo , MD    CC: Pa, [facility]

## 2020-04-09 NOTE — Telephone Encounter (Signed)
Called about critical lab, K was 2.8. I am sure this is due to her diarrhea.   I spoke with her on the phone and called in K dur 71mq pills, one pill daily for 2 weeks.  No refills.

## 2020-04-09 NOTE — Patient Instructions (Addendum)
If you are age 35 or older, your body mass index should be between 23-30. Your Body mass index is 26.9 kg/m. If this is out of the aforementioned range listed, please consider follow up with your Primary Care Provider.  If you are age 35 or younger, your body mass index should be between 19-25. Your Body mass index is 26.9 kg/m. If this is out of the aformentioned range listed, please consider follow up with your Primary Care Provider.   Your provider has requested that you go to the basement level for lab work before leaving today. Press "B" on the elevator. The lab is located at the first door on the left as you exit the elevator.  Due to recent changes in healthcare laws, you may see the results of your imaging and laboratory studies on MyChart before your provider has had a chance to review them.  We understand that in some cases there may be results that are confusing or concerning to you. Not all laboratory results come back in the same time frame and the provider may be waiting for multiple results in order to interpret others.  Please give [place] 48 hours in order for your provider to thoroughly review all the results before contacting the office for clarification of your results.   We have sent the following medications to your pharmacy for you to pick up at your convenience:  START: Tramadol 35m every 8 hours as needed.   START: Prednisone 565m Take 4 tablets (2043motal) for 28 days, then 3 tablets (31m66mtal) for 14 days, then 2 tablets (10mg34mal) for 14 days, then 1 tablet (5mg t45ml) for 14 days, then discontinue.  START: prilosec 20mg o48mablet daily.   Please increase fluids on a daily basis.  You may drink Pedialyte, Vitamin water, and juices or smoothies.  Please try to eat small but frequent meals.  You will follow up with JessicaAlonza Boguson 05-05-20 at 3:30pm.  You will follow up with Dr NandigaSilverio Decamp9-21 at 11:00am.  Beth wiEustaquio Maizee in contact with you regarding  [facility].  I appreciate the opportunity to care for you. Thank you for choosing me and [facility],  Dr. KavithaHarl Bowie

## 2020-04-12 ENCOUNTER — Other Ambulatory Visit: Payer: Self-pay

## 2020-04-12 DIAGNOSIS — K51311 Ulcerative (chronic) rectosigmoiditis with rectal bleeding: Secondary | ICD-10-CM

## 2020-04-12 DIAGNOSIS — E876 Hypokalemia: Secondary | ICD-10-CM

## 2020-04-12 LAB — B12 AND FOLATE PANEL
Folate: 8.1 ng/mL (ref >5.9)
Vitamin B-12: 836 pg/mL (ref 211–911)

## 2020-04-12 NOTE — Telephone Encounter (Signed)
Thank you [name]. Joyce Solomon, I do not see the results in epic, can you please check?

## 2020-04-13 ENCOUNTER — Other Ambulatory Visit (INDEPENDENT_AMBULATORY_CARE_PROVIDER_SITE_OTHER): Payer: Medicaid Other

## 2020-04-13 DIAGNOSIS — E876 Hypokalemia: Secondary | ICD-10-CM

## 2020-04-13 DIAGNOSIS — K51311 Ulcerative (chronic) rectosigmoiditis with rectal bleeding: Secondary | ICD-10-CM

## 2020-04-13 LAB — BASIC METABOLIC PANEL
BUN: 7 mg/dL (ref 6–23)
CO2: 27 mEq/L (ref 19–32)
Calcium: 9.1 mg/dL (ref 8.4–10.5)
Chloride: 105 mEq/L (ref 96–112)
Creatinine, Ser: 0.61 mg/dL (ref 0.40–1.20)
GFR: 135.53 mL/min (ref >60.00)
Glucose, Bld: 117 mg/dL — ABNORMAL HIGH (ref 70–99)
Potassium: 3.2 mEq/L — ABNORMAL LOW (ref 3.5–5.1)
Sodium: 141 mEq/L (ref 135–145)

## 2020-04-21 ENCOUNTER — Ambulatory Visit: Payer: Medicaid Other | Admitting: Obstetrics and Gynecology

## 2020-04-22 ENCOUNTER — Other Ambulatory Visit: Payer: Self-pay | Admitting: Gastroenterology

## 2020-05-05 ENCOUNTER — Other Ambulatory Visit: Payer: Self-pay

## 2020-05-05 ENCOUNTER — Other Ambulatory Visit: Payer: Medicaid Other

## 2020-05-05 ENCOUNTER — Ambulatory Visit: Payer: Medicaid Other | Admitting: Gastroenterology

## 2020-05-05 ENCOUNTER — Encounter: Payer: Self-pay | Admitting: Gastroenterology

## 2020-05-05 VITALS — BP 92/70 | HR 64 | Ht 59.0 in | Wt 128.8 lb

## 2020-05-05 DIAGNOSIS — K625 Hemorrhage of anus and rectum: Secondary | ICD-10-CM

## 2020-05-05 DIAGNOSIS — K51911 Ulcerative colitis, unspecified with rectal bleeding: Secondary | ICD-10-CM

## 2020-05-05 DIAGNOSIS — K51311 Ulcerative (chronic) rectosigmoiditis with rectal bleeding: Secondary | ICD-10-CM

## 2020-05-05 MED ORDER — PREDNISONE 10 MG PO TABS
ORAL_TABLET | ORAL | 0 refills | Status: DC
Start: 2020-05-05 — End: 2020-06-17

## 2020-05-05 NOTE — Progress Notes (Signed)
05/05/2020 Joyce Solomon [id number] 02/07/1986   HISTORY OF PRESENT ILLNESS: This is a 34 year old female who is a patient of Dr. Woodward Ku.  She follows here for left-sided ulcerative colitis.  Please see her note from Apr 09, 2020 for further details.  Plan was to begin [facility] and in the interim she was placed on prednisone 20 mg daily to be tapered by 5 mg weekly.  Her primary complaint recently has been rectal bleeding.  She apparently had an allergic reaction to Remicade in 2016 and then had antibodies and undetectable drug level.  Then had issues with Humira compliance, etc.  Recently had a baby on Mar 23, 2020.  Is no longer breast-feeding.  She is here today for scheduled follow-up.  Her main complaint continues to be rectal bleeding.  It has not improved since she saw Dr. Silverio Decamp in May.  Still on prednisone 20 mg daily.  She reports intermittent diarrhea, but has normal stools in between as well.  No significant abdominal pain.   Past Medical History:  Diagnosis Date  . Allergy    SEASONAL  . Blood transfusion without reported diagnosis   . Headache   . Iron Deficiency Anemia 2008   s/p transfusions 2009, 2010; inconsistent compliance with PO iron.   . Non compliance w medication regimen 06/2015   stopped Remicade, may not have been taking Azulfidine.  . Thyrotoxicosis   . Ulcerative Colitis 2007   on Humira   Past Surgical History:  Procedure Laterality Date  . CESAREAN SECTION N/A 12/09/2013   Procedure: Primary Cesarean Section Delivery Baby Girl @ 1959, Apgars 8/8;  Surgeon: Logan Bores, MD;  Location: [facility];  Service: Obstetrics;  Laterality: N/A;  . CESAREAN SECTION N/A 08/14/2017   Procedure: CESAREAN SECTION;  Surgeon: Paula Compton, MD;  Location: [place];  Service: Obstetrics;  Laterality: N/A;  . CESAREAN SECTION N/A 03/23/2020   Procedure: CESAREAN SECTION;  Surgeon: Osborne Oman, MD;  Location: [facility];  Service:  Obstetrics;  Laterality: N/A;  . COLONOSCOPY  01/19/2011   Dr Penelope Coop. no active UC  . COLONOSCOPY W/ BIOPSIES N/A 04/2014   Dr Deatra Ina: Left sided colitis from prox descending to rectum; no inflammation at or proximal to splenic flexure  . DILATION AND EVACUATION  08/21/2012   Procedure: DILATATION AND EVACUATION;  Surgeon: Logan Bores, MD;  Location: [facility];  Service: Gynecology  . TONSILLECTOMY      reports that she quit smoking about 16 months ago. She quit after 3.00 years of use. She has never used smokeless tobacco. She reports previous alcohol use. She reports that she does not use drugs. family history includes Diabetes in her sister; Miscarriages / [place] in an other family member. Allergies  Allergen Reactions  . Latex Rash  . Remicade [Infliximab] Itching      Outpatient Encounter Medications as of 05/05/2020  Medication Sig  . predniSONE (DELTASONE) 5 MG tablet Take 4 tablets (20 mg total) by mouth daily with breakfast for 28 days, THEN 3 tablets (15 mg total) daily with breakfast for 14 days, THEN 2 tablets (10 mg total) daily with breakfast for 14 days, THEN 1 tablet (5 mg total) daily with breakfast for 14 days.  . [DISCONTINUED] HUMIRA PEN 40 MG/0.4ML PNKT INJECT 40 MG INTO THE SKIN EVERY 14 (FOURTEEN) DAYS.  . [DISCONTINUED] omeprazole (PRILOSEC) 20 MG capsule Take 1 capsule (20 mg total) by mouth daily.  . [DISCONTINUED] potassium chloride (KLOR-CON)  10 MEQ tablet Take 1 tablet (10 mEq total) by mouth daily.  . [DISCONTINUED] Prenatal Vit w/Fe-Methylfol-FA (PNV PO) Take 1 tablet by mouth daily.   . [DISCONTINUED] traMADol (ULTRAM) 50 MG tablet Take 1 tablet (50 mg total) by mouth every 8 (eight) hours as needed.   No facility-administered encounter medications on file as of 05/05/2020.     REVIEW OF SYSTEMS  : All other systems reviewed and negative except where noted in the History of Present Illness.   PHYSICAL EXAM: BP 92/70   Pulse 64   Ht 4' 11"   (1.499 m)   Wt 128 lb 12.8 oz (58.4 kg)   LMP 06/28/2019 (Approximate)   BMI 26.01 kg/m  General: Well developed AA female in no acute distress Head: Normocephalic and atraumatic Eyes:  Sclerae anicteric, conjunctiva pink. Ears: Normal auditory acuity Lungs: Clear throughout to auscultation; no increased WOB. Heart: Regular rate and rhythm; no M/R/G. Abdomen: Soft, non-distended.  BS present.  Non-tender. Musculoskeletal: Symmetrical with no gross deformities  Skin: No lesions on visible extremities Extremities: No edema  Neurological: Alert oriented x 4, grossly non-focal Psychological:  Alert and cooperative. Normal mood and affect  ASSESSMENT AND PLAN: *34 year old female with left-sided ulcerative colitis: Failed Humira/noncompliance and history of allergic reaction to Remicade.  Plan is for Entyvio.  Nursing will work on getting this scheduled/approved.  She does need QuantiFERON gold to be collected today.  She is still having the same rectal bleeding on prednisone 20 mg daily.  Will increase that to 30 mg daily for 2 weeks and then begin decreasing by 5 mg weekly again after that.  Hopefully we will be able to get the [facility] initiated soon.  She never had the stool studies performed, but she is not having diarrhea every day, has normal stools at times as well so I think that we can hold off for now.  Bleeding has been the biggest issue recently.  She is no longer breast-feeding.  New prescription for prednisone sent to the pharmacy.  She has an appoint with Dr. Silverio Decamp at the end of July and will keep that appointment in place.   CC:  [facility], [facility]

## 2020-05-05 NOTE — Patient Instructions (Addendum)
If you are age 35 or older, your body mass index should be between 23-30. Your Body mass index is 26.01 kg/m. If this is out of the aforementioned range listed, please consider follow up with your Primary Care Provider.  If you are age 11 or younger, your body mass index should be between 19-25. Your Body mass index is 26.01 kg/m. If this is out of the aformentioned range listed, please consider follow up with your Primary Care Provider.   Your provider has requested that you go to the basement level for lab work before leaving today. Press "B" on the elevator. The lab is located at the first door on the left as you exit the elevator.   We have sent the following medications to your pharmacy for you to pick up at your convenience: Prednisone 30 mg daily for 2 weeks then decrease by 5 mg weekly until visit with Dr. Silverio Decamp.   Keep follow up appointment with Dr. Silverio Decamp.

## 2020-05-06 ENCOUNTER — Telehealth: Payer: Self-pay

## 2020-05-06 NOTE — Telephone Encounter (Signed)
Spoke with the patient. She is advised of the appointment and agrees to the date. Instructed her to make the 2nd appointment of her induction before she leaves from getting the first one.

## 2020-05-06 NOTE — Telephone Encounter (Signed)
Called the patient's listed phone number. No answer. Left her details on the infusion date to start her induction.

## 2020-05-07 LAB — QUANTIFERON-TB GOLD PLUS
Mitogen-NIL: >10 IU/mL
NIL: 0.09 IU/mL
QuantiFERON-TB Gold Plus: NEGATIVE
TB1-NIL: <0 IU/mL
TB2-NIL: <0 IU/mL

## 2020-05-11 ENCOUNTER — Encounter: Payer: Self-pay | Admitting: Obstetrics

## 2020-05-11 ENCOUNTER — Other Ambulatory Visit: Payer: Self-pay

## 2020-05-11 ENCOUNTER — Ambulatory Visit (INDEPENDENT_AMBULATORY_CARE_PROVIDER_SITE_OTHER): Payer: Medicaid Other | Admitting: Obstetrics

## 2020-05-11 DIAGNOSIS — K51311 Ulcerative (chronic) rectosigmoiditis with rectal bleeding: Secondary | ICD-10-CM

## 2020-05-11 DIAGNOSIS — Z98891 History of uterine scar from previous surgery: Secondary | ICD-10-CM

## 2020-05-11 DIAGNOSIS — Z3009 Encounter for other general counseling and advice on contraception: Secondary | ICD-10-CM

## 2020-05-11 DIAGNOSIS — F5101 Primary insomnia: Secondary | ICD-10-CM

## 2020-05-11 NOTE — Progress Notes (Addendum)
..     [facility] Partum Visit Note  Joyce Solomon is a 34 y.o. [phone] female who presents for a postpartum visit. She is 7 weeks postpartum following a repeat cesarean section.  I have fully reviewed the prenatal and intrapartum course. The delivery was at 38.1 gestational weeks.  Anesthesia: spinal. Postpartum course has been challenging due to lack of sleep. Baby is doing well. Baby is feeding by bottle Dory Horn good start. Bleeding staining only. Bowel function is normal. Bladder function is normal. Patient is sexually active. Contraception method is none. Postpartum depression screening: negative.  The following portions of the patient's history were reviewed and updated as appropriate: allergies, current medications, past family history, past medical history, past social history, past surgical history and problem list.  Review of Systems A comprehensive review of systems was negative.    Objective:  Blood pressure 135/87, pulse 78, height 4' 11"  (1.499 m), weight 130 lb (59 kg), last menstrual period 06/30/2019, currently breastfeeding.  General:  alert and no distress   Breasts:  inspection negative, no nipple discharge or bleeding, no masses or nodularity palpable  Lungs: clear to auscultation bilaterally  Heart:  regular rate and rhythm, S1, S2 normal, no murmur, click, rub or gallop  Abdomen: soft, non-tender; bowel sounds normal; no masses,  no organomegaly   Assessment:   1. Postpartum care following cesarean delivery Rx: - Prenat w/o A-FeCbn-Meth-FA-DHA (PRENATE MINI) 29-0.6-0.4-350 MG CAPS; Take 1 capsule by mouth daily before breakfast.  Dispense: 30 capsule; Refill: 11  2. H/O cesarean section  3. Primary insomnia Rx: - zolpidem (AMBIEN) 5 MG tablet; Take 1 tablet (5 mg total) by mouth at bedtime as needed for sleep.  Dispense: 30 tablet; Refill: 0  4. Encounter for other general counseling and advice on contraception - wants Nexplanon  Plan:   Essential components  of care per ACOG recommendations:  1.  Mood and well being: Patient with negative depression screening today. Reviewed local resources for support.  - Patient does not use tobacco.  - hx of drug use? No    2. Infant care and feeding:  -Patient currently breastmilk feeding? No  -Social determinants of health (SDOH) reviewed in EPIC. No concerns.  3. Sexuality, contraception and birth spacing - Patient does not want a pregnancy in the next year.  Desired family size is 5 children.  - Reviewed forms of contraception in tiered fashion. Patient desired Nexplanon today.   - Discussed birth spacing of 18 months  4. Sleep and fatigue -Encouraged family/partner/community support of 4 hrs of uninterrupted sleep to help with mood and fatigue  5. Physical Recovery  - Discussed patients delivery - Patient had a C/S  - Patient has urinary incontinence? No - Patient is not safe to resume physical and sexual activity  6.  Health Maintenance - Last pap smear done 09-17-19 and was normal with negative HPV.  7. No Chronic Disease  8. Follow up in 2 weeks for Nexplanon insertion   Baltazar Najjar, [place] for [facility], [facility] 05/12/20

## 2020-05-12 ENCOUNTER — Inpatient Hospital Stay (HOSPITAL_COMMUNITY): Admission: RE | Admit: 2020-05-12 | Payer: Medicaid Other | Source: Ambulatory Visit

## 2020-05-12 MED ORDER — PRENATE MINI 29-0.6-0.4-350 MG PO CAPS: 1.0000 | ORAL_CAPSULE | Freq: Every day | ORAL | 11 refills | Status: DC

## 2020-05-12 MED ORDER — ZOLPIDEM TARTRATE 5 MG PO TABS: 5.0000 mg | ORAL_TABLET | Freq: Every evening | ORAL | 0 refills | Status: DC | PRN

## 2020-05-12 NOTE — Addendum Note (Signed)
Addended by: Shelly Bombard on: 05/12/2020 04:21 PM   Modules accepted: Orders

## 2020-05-18 ENCOUNTER — Other Ambulatory Visit: Payer: Self-pay

## 2020-05-18 ENCOUNTER — Encounter (HOSPITAL_COMMUNITY)
Admission: RE | Admit: 2020-05-18 | Discharge: 2020-05-18 | Disposition: A | Discharge status: Home / Self Care | Payer: Medicaid Other | Source: Ambulatory Visit | Attending: Obstetrics and Gynecology | Admitting: Obstetrics and Gynecology

## 2020-05-18 DIAGNOSIS — K51311 Ulcerative (chronic) rectosigmoiditis with rectal bleeding: Secondary | ICD-10-CM | POA: Insufficient documentation

## 2020-05-18 MED ORDER — VEDOLIZUMAB 300 MG IV SOLR
300.0000 mg | INTRAVENOUS | Status: DC
  Administered 2020-05-18: 300 mg via INTRAVENOUS
  Filled 2020-05-18: qty 5

## 2020-05-18 NOTE — Discharge Instructions (Signed)
Vedolizumab injection solution What is this medicine? VEDOLIZUMAB (Ve doe LIZ you mab) is used to treat ulcerative colitis and Crohn's disease in adult patients. This medicine may be used for other purposes; ask your health care provider or pharmacist if you have questions. COMMON BRAND NAME(S): Entyvio What should I tell my health care provider before I take this medicine? They need to know if you have any of these conditions:  diabetes  hepatitis B or history of hepatitis B infection  HIV or AIDS  immune system problems  infection or history of infections  liver disease  recently received or scheduled to receive a vaccine  scheduled to have surgery  tuberculosis, a positive skin test for tuberculosis or have recently been in close contact with someone who has tuberculosis   an unusual or allergic reaction to vedolizumab, other medicines, foods, dyes, or preservatives  pregnant or trying to get pregnant  breast-feeding How should I use this medicine? This medicine is for infusion into a vein. It is given by a health care professional in a hospital or clinic setting. A special MedGuide will be given to you by the pharmacist with each prescription and refill. Be sure to read this information carefully each time. Talk to your pediatrician regarding the use of this medicine in children. This medicine is not approved for use in children. Overdosage: If you think you have taken too much of this medicine contact a poison control center or emergency room at once. NOTE: This medicine is only for you. Do not share this medicine with others. What if I miss a dose? It is important not to miss your dose. Call your doctor or health care professional if you are unable to keep an appointment. What may interact with this medicine?  steroid medicines like prednisone or cortisone  TNF-alpha inhibitors like natalizumab, adalimumab, and infliximab  vaccines This list may not describe all  possible interactions. Give your health care provider a list of all the medicines, herbs, non-prescription drugs, or dietary supplements you use. Also tell them if you smoke, drink alcohol, or use illegal drugs. Some items may interact with your medicine. What should I watch for while using this medicine? Your condition will be monitored carefully while you are receiving this medicine. Visit your doctor for regular check ups. Tell your doctor or healthcare professional if your symptoms do not start to get better or if they get worse. Stay away from people who are sick. Call your doctor or health care professional for advice if you get a fever, chills or sore throat, or other symptoms of a cold or flu. Do not treat yourself. In some patients, this medicine may cause a serious brain infection that may cause death. If you have any problems seeing, thinking, speaking, walking, or standing, tell your doctor right away. If you cannot reach your doctor, get urgent medical care. What side effects may I notice from receiving this medicine? Side effects that you should report to your doctor or health care professional as soon as possible:  allergic reactions like skin rash, itching or hives, swelling of the face, lips, or tongue  breathing problems  changes in vision  chest pain  dark urine  depression, feelings of sadness  dizziness  general ill feeling or flu-like symptoms  irregular, missed, or painful menstrual periods  light-colored stools  loss of appetite, nausea  muscle weakness  problems with balance, talking, or walking  right upper belly pain  unusually weak or tired  yellowing  of the eyes or skin Side effects that usually do not require medical attention (report to your doctor or health care professional if they continue or are bothersome):  aches, pains  headache  stomach upset  tiredness This list may not describe all possible side effects. Call your doctor for  medical advice about side effects. You may report side effects to FDA at 1-800-FDA-1088. Where should I keep my medicine? This drug is given in a hospital or clinic and will not be stored at home. NOTE: This sheet is a summary. It may not cover all possible information. If you have questions about this medicine, talk to your doctor, pharmacist, or health care provider.  2020 Elsevier/Gold Standard (2015-12-09 08:36:51)

## 2020-05-20 NOTE — Progress Notes (Signed)
Reviewed and agree with documentation and assessment and plan. K. Veena Taejah Ohalloran , MD   

## 2020-05-25 ENCOUNTER — Ambulatory Visit: Payer: Medicaid Other | Admitting: Obstetrics

## 2020-06-01 ENCOUNTER — Other Ambulatory Visit: Payer: Self-pay

## 2020-06-01 ENCOUNTER — Encounter (HOSPITAL_COMMUNITY)
Admission: RE | Admit: 2020-06-01 | Discharge: 2020-06-01 | Disposition: A | Discharge status: Home / Self Care | Payer: Medicaid Other | Source: Ambulatory Visit | Attending: Obstetrics and Gynecology | Admitting: Obstetrics and Gynecology

## 2020-06-01 DIAGNOSIS — K51311 Ulcerative (chronic) rectosigmoiditis with rectal bleeding: Secondary | ICD-10-CM | POA: Diagnosis present

## 2020-06-01 MED ORDER — VEDOLIZUMAB 300 MG IV SOLR
300.0000 mg | INTRAVENOUS | Status: DC
  Administered 2020-06-01: 300 mg via INTRAVENOUS
  Filled 2020-06-01: qty 5

## 2020-06-04 MED FILL — Sodium Chloride IV Soln 0.9%: INTRAVENOUS | Qty: 250 | Status: AC

## 2020-06-04 MED FILL — Vedolizumab For IV Solution 300 MG: INTRAVENOUS | Qty: 5 | Status: AC

## 2020-06-17 ENCOUNTER — Encounter: Payer: Self-pay | Admitting: Gastroenterology

## 2020-06-17 ENCOUNTER — Other Ambulatory Visit (INDEPENDENT_AMBULATORY_CARE_PROVIDER_SITE_OTHER): Payer: Medicaid Other

## 2020-06-17 ENCOUNTER — Ambulatory Visit (INDEPENDENT_AMBULATORY_CARE_PROVIDER_SITE_OTHER): Payer: Medicaid Other | Admitting: Gastroenterology

## 2020-06-17 VITALS — BP 110/70 | HR 85 | Ht 59.0 in | Wt 132.0 lb

## 2020-06-17 DIAGNOSIS — K5180 Other ulcerative colitis without complications: Secondary | ICD-10-CM

## 2020-06-17 LAB — COMPREHENSIVE METABOLIC PANEL
ALT: 9 U/L (ref 0–35)
AST: 11 U/L (ref 0–37)
Albumin: 4 g/dL (ref 3.5–5.2)
Alkaline Phosphatase: 66 U/L (ref 39–117)
BUN: 6 mg/dL (ref 6–23)
CO2: 27 mEq/L (ref 19–32)
Calcium: 9 mg/dL (ref 8.4–10.5)
Chloride: 106 mEq/L (ref 96–112)
Creatinine, Ser: 0.6 mg/dL (ref 0.40–1.20)
GFR: 137.99 mL/min (ref >60.00)
Glucose, Bld: 97 mg/dL (ref 70–99)
Potassium: 4.3 mEq/L (ref 3.5–5.1)
Sodium: 136 mEq/L (ref 135–145)
Total Bilirubin: 0.3 mg/dL (ref 0.2–1.2)
Total Protein: 7.1 g/dL (ref 6.0–8.3)

## 2020-06-17 LAB — HIGH SENSITIVITY CRP: CRP, High Sensitivity: 0.63 mg/L (ref 0.000–5.000)

## 2020-06-17 NOTE — Patient Instructions (Addendum)
Continue Entyvio infusions as scheduled  Go to the basement today for labs  Take 1 multivitamin capsule a day  Follow up in 3 months  I appreciate the  opportunity to care for you  Thank You   Harl Bowie , MD

## 2020-06-17 NOTE — Progress Notes (Signed)
Joyce Solomon    [id number]    22-Nov-1985  Primary Care Physician:[facility], [facility]  Referring Physician: [place], [place] [address] [place],  [place] [address]   Chief complaint:  Ulcerative colitis  HPI: 34 year old very pleasant female with history of ulcerative colitis, recent flare during pregnancy and postpartum here for follow-up visit She is overall doing much better.  Completed steroid taper She received first 2 induction doses of Entyvio so far She is having 1-2 formed bowel movements daily with no rectal bleeding or mucus.  She continues to have intermittent lower abdominal cramping but denies any nausea or vomiting or decreased appetite.  Relevant GI history: She was in clinical remission on Humira, self discontinued after she became pregnant, restarted in Nov 2020and shestopped it again after 3 months as she thought it was not helping  Colonoscopy Apr 10, 2018 negative for active colitis, chronic mucosal changes with altered vascularity and mild erythema. Random biopsies negative for active inflammation.  Drug trough 5.9 with undetectable antibody for Humira   Colonoscopy June 2015 showed moderately severe left-sided colitis  Allergic reaction to Remicade in 2016. Elevated antibody level to infliximab 1622 and undetectable drug level  Flexible sigmoidoscopy January 2017 showed severe colitis left colon with ulceration, biopsies showed moderately active colitis.  Started on Humira March 2017, and clinical remission since then  Outpatient Encounter Medications as of 06/17/2020  Medication Sig  . [DISCONTINUED] HUMIRA PEN 40 MG/0.4ML PNKT INJECT 40 MG INTO THE SKIN EVERY 14 (FOURTEEN) DAYS.  . [DISCONTINUED] predniSONE (DELTASONE) 10 MG tablet Use as directed. (Patient not taking: Reported on 05/11/2020)  . [DISCONTINUED] predniSONE (DELTASONE) 5 MG tablet Take 4 tablets (20 mg total) by mouth daily with breakfast for 28 days, THEN  3 tablets (15 mg total) daily with breakfast for 14 days, THEN 2 tablets (10 mg total) daily with breakfast for 14 days, THEN 1 tablet (5 mg total) daily with breakfast for 14 days.  . [DISCONTINUED] Prenat w/o A-FeCbn-Meth-FA-DHA (PRENATE MINI) 29-0.6-0.4-350 MG CAPS Take 1 capsule by mouth daily before breakfast.  . [DISCONTINUED] zolpidem (AMBIEN) 5 MG tablet Take 1 tablet (5 mg total) by mouth at bedtime as needed for sleep.   No facility-administered encounter medications on file as of 06/17/2020.    Allergies as of 06/17/2020 - Review Complete 06/17/2020  Allergen Reaction Noted  . Latex Rash 05/20/2009  . Remicade [infliximab] Itching 08/07/2015    Past Medical History:  Diagnosis Date  . Allergy    SEASONAL  . Blood transfusion without reported diagnosis   . Headache   . Iron Deficiency Anemia 2008   s/p transfusions 2009, 2010; inconsistent compliance with PO iron.   . Non compliance w medication regimen 06/2015   stopped Remicade, may not have been taking Azulfidine.  . Thyrotoxicosis   . Ulcerative Colitis 2007   on Humira    Past Surgical History:  Procedure Laterality Date  . CESAREAN SECTION N/A 12/09/2013   Procedure: Primary Cesarean Section Delivery Baby Girl @ 1959, Apgars 8/8;  Surgeon: Logan Bores, MD;  Location: [facility];  Service: Obstetrics;  Laterality: N/A;  . CESAREAN SECTION N/A 08/14/2017   Procedure: CESAREAN SECTION;  Surgeon: Paula Compton, MD;  Location: [facility];  Service: Obstetrics;  Laterality: N/A;  . CESAREAN SECTION N/A 03/23/2020   Procedure: CESAREAN SECTION;  Surgeon: Osborne Oman, MD;  Location: [facility];  Service: Obstetrics;  Laterality: N/A;  .  COLONOSCOPY  01/19/2011   Dr Penelope Coop. no active UC  . COLONOSCOPY W/ BIOPSIES N/A 04/2014   Dr Deatra Ina: Left sided colitis from prox descending to rectum; no inflammation at or proximal to splenic flexure  . DILATION AND EVACUATION  08/21/2012   Procedure: DILATATION AND  EVACUATION;  Surgeon: Logan Bores, MD;  Location: [facility];  Service: Gynecology  . TONSILLECTOMY      Family History  Problem Relation Age of Onset  . Miscarriages / Stillbirths Other   . Diabetes Sister   . Hypotension Neg Hx   . Alcohol abuse Neg Hx   . Arthritis Neg Hx   . Asthma Neg Hx   . Birth defects Neg Hx   . COPD Neg Hx   . Depression Neg Hx   . Drug abuse Neg Hx   . Hearing loss Neg Hx   . Heart disease Neg Hx   . Hyperlipidemia Neg Hx   . Hypertension Neg Hx   . Kidney disease Neg Hx   . Learning disabilities Neg Hx   . Mental illness Neg Hx   . Mental retardation Neg Hx   . Colon cancer Neg Hx   . Colon polyps Neg Hx   . Esophageal cancer Neg Hx   . Stomach cancer Neg Hx   . Rectal cancer Neg Hx     Social History   Socioeconomic History  . Marital status: Single    Spouse name: Not on file  . Number of children: 2  . Years of education: Not on file  . Highest education level: Not on file  Occupational History  . Occupation: unemployed  Tobacco Use  . Smoking status: Former Smoker    Years: 3.00    Quit date: 12/13/2018    Years since quitting: 1.5  . Smokeless tobacco: Never Used  . Tobacco comment: smokes a cig "every blue moon"  Vaping Use  . Vaping Use: Never used  Substance and Sexual Activity  . Alcohol use: Not Currently    Alcohol/week: 0.0 standard drinks    Comment: occassional  . Drug use: No  . Sexual activity: Yes    Birth control/protection: None  Other Topics Concern  . Not on file  Social History Narrative  . Not on file   Social Determinants of Health   Financial Resource Strain:   . Difficulty of Paying Living Expenses:   Food Insecurity:   . Worried About Charity fundraiser in the Last Year:   . Arboriculturist in the Last Year:   Transportation Needs:   . Film/video editor (Medical):   [name] Lack of Transportation (Non-Medical):   Physical Activity:   . Days of Exercise per Week:   . Minutes of Exercise  per Session:   Stress:   . Feeling of Stress :   Social Connections:   . Frequency of Communication with Friends and Family:   . Frequency of Social Gatherings with Friends and Family:   . Attends Religious Services:   . Active Member of Clubs or Organizations:   . Attends Archivist Meetings:   [name] Marital Status:   Intimate Partner Violence:   . Fear of Current or Ex-Partner:   . Emotionally Abused:   [name] Physically Abused:   . Sexually Abused:       Review of systems: All other review of systems negative except as mentioned in the HPI.   Physical Exam: Vitals:   06/17/20 1107  BP: 110/70  Pulse: 85   Body mass index is 26.66 kg/m. Gen:      No acute distress HEENT:  sclera anicteric Neuro: alert and oriented x 3 Psych: normal mood and affect  Data Reviewed:  Reviewed labs, radiology imaging, old records and pertinent past GI work up   Assessment and Plan/Recommendations:  34 year old female with history of left-sided ulcerative colitis  S/p recent acute flare April to June 2021 improved with steroid taper and received 2 doses of Entyvio so far.  Appears to be in clinical remission currently  Continue Entyvio induction and maintenance therapy after Recheck CMP and CRP  Hypokalemia secondary to diarrhea with acute UC flare  Advised patient to take multivitamin with folate and B12  Return in 3 months or sooner if needed   The patient was provided an opportunity to ask questions and all were answered. The patient agreed with the plan and demonstrated an understanding of the instructions.  Damaris Hippo , MD    CC: [facility]

## 2020-06-18 ENCOUNTER — Encounter: Payer: Self-pay | Admitting: Gastroenterology

## 2020-06-29 ENCOUNTER — Encounter (HOSPITAL_COMMUNITY): Payer: Medicaid Other

## 2020-07-07 ENCOUNTER — Ambulatory Visit: Payer: Self-pay | Admitting: Gastroenterology

## 2020-07-07 ENCOUNTER — Encounter (HOSPITAL_COMMUNITY): Payer: Medicaid Other

## 2020-07-19 ENCOUNTER — Ambulatory Visit (HOSPITAL_COMMUNITY)
Admission: RE | Admit: 2020-07-19 | Discharge: 2020-07-19 | Disposition: A | Discharge status: Home / Self Care | Payer: Medicaid Other | Source: Ambulatory Visit | Attending: Gastroenterology | Admitting: Gastroenterology

## 2020-07-19 ENCOUNTER — Other Ambulatory Visit: Payer: Self-pay

## 2020-07-19 DIAGNOSIS — K51311 Ulcerative (chronic) rectosigmoiditis with rectal bleeding: Secondary | ICD-10-CM | POA: Insufficient documentation

## 2020-07-19 MED ORDER — VEDOLIZUMAB 300 MG IV SOLR
300.0000 mg | INTRAVENOUS | Status: DC
  Administered 2020-07-19: 300 mg via INTRAVENOUS
  Filled 2020-07-19: qty 5

## 2020-07-21 ENCOUNTER — Telehealth: Payer: Self-pay | Admitting: Gastroenterology

## 2020-07-21 NOTE — Telephone Encounter (Signed)
Pt called to inform that a nurse at the hospital told her that she could have her Iv infusion at her house and that it could be administer bu a nurse so she does not have to go to the hospital. Pt is inquiring about that.

## 2020-07-22 NOTE — Telephone Encounter (Signed)
Spoke to rep @ [employer] [facility].  No Josem Kaufmann is req but suggested that I check with individual facility to verify participation.  So I called [facility], they said that they could take her and they also provide home health infusions for patients with her insurance.  All you need to do is send referral they will take care of appt and benefit processing. Thanks

## 2020-07-22 NOTE — Telephone Encounter (Signed)
New Medicaid policy. How do I find out what infusion service companies participate with her insurance? The patient is presently receiving Entyvio infusions through the hospital day hospital.

## 2020-07-22 NOTE — Telephone Encounter (Signed)
Records printed and referral form with care plan completed for Dr Silverio Decamp to sign upon her return 07/28/20. Patient is aware of this.

## 2020-08-03 ENCOUNTER — Other Ambulatory Visit: Payer: Self-pay | Admitting: Obstetrics

## 2020-08-03 DIAGNOSIS — F5101 Primary insomnia: Secondary | ICD-10-CM

## 2020-08-03 NOTE — Telephone Encounter (Signed)
Confirmed the referral was received and is being processed.

## 2020-08-04 ENCOUNTER — Telehealth: Payer: Self-pay | Admitting: Gastroenterology

## 2020-08-05 NOTE — Telephone Encounter (Signed)
Patient will be receiving Entyvio infusions through [facility].  Appointment with [facility] Short Stay not needed. Left a message on the voicemail to cancel the 09/13/20 appointment.

## 2020-08-06 ENCOUNTER — Emergency Department (HOSPITAL_COMMUNITY)
Admission: EM | Admit: 2020-08-06 | Discharge: 2020-08-06 | Disposition: A | Discharge status: Home / Self Care | Payer: Medicaid Other | Attending: Emergency Medicine | Admitting: Emergency Medicine

## 2020-08-06 ENCOUNTER — Encounter (HOSPITAL_COMMUNITY): Payer: Self-pay

## 2020-08-06 ENCOUNTER — Other Ambulatory Visit: Payer: Self-pay

## 2020-08-06 DIAGNOSIS — Z760 Encounter for issue of repeat prescription: Secondary | ICD-10-CM | POA: Insufficient documentation

## 2020-08-06 DIAGNOSIS — F419 Anxiety disorder, unspecified: Secondary | ICD-10-CM | POA: Diagnosis not present

## 2020-08-06 DIAGNOSIS — Z5321 Procedure and treatment not carried out due to patient leaving prior to being seen by health care provider: Secondary | ICD-10-CM | POA: Insufficient documentation

## 2020-08-06 DIAGNOSIS — R002 Palpitations: Secondary | ICD-10-CM | POA: Insufficient documentation

## 2020-08-06 LAB — CBC
HCT: 43.3 % (ref 36.0–46.0)
Hemoglobin: 13.8 g/dL (ref 12.0–15.0)
MCH: 28.8 pg (ref 26.0–34.0)
MCHC: 31.9 g/dL (ref 30.0–36.0)
MCV: 90.2 fL (ref 80.0–100.0)
Platelets: 316 10*3/uL (ref 150–400)
RBC: 4.8 MIL/uL (ref 3.87–5.11)
RDW: 16.3 % — ABNORMAL HIGH (ref 11.5–15.5)
WBC: 15 10*3/uL — ABNORMAL HIGH (ref 4.0–10.5)
nRBC: 0 % (ref 0.0–0.2)

## 2020-08-06 LAB — ETHANOL: Alcohol, Ethyl (B): <10 mg/dL (ref <10)

## 2020-08-06 LAB — COMPREHENSIVE METABOLIC PANEL
ALT: 10 U/L (ref 0–44)
AST: 12 U/L — ABNORMAL LOW (ref 15–41)
Albumin: 3.9 g/dL (ref 3.5–5.0)
Alkaline Phosphatase: 68 U/L (ref 38–126)
Anion gap: 10 (ref 5–15)
BUN: <5 mg/dL — ABNORMAL LOW (ref 6–20)
CO2: 21 mmol/L — ABNORMAL LOW (ref 22–32)
Calcium: 9 mg/dL (ref 8.9–10.3)
Chloride: 107 mmol/L (ref 98–111)
Creatinine, Ser: 0.51 mg/dL (ref 0.44–1.00)
GFR calc Af Amer: >60 mL/min (ref >60)
GFR calc non Af Amer: >60 mL/min (ref >60)
Glucose, Bld: 94 mg/dL (ref 70–99)
Potassium: 3.4 mmol/L — ABNORMAL LOW (ref 3.5–5.1)
Sodium: 138 mmol/L (ref 135–145)
Total Bilirubin: 0.5 mg/dL (ref 0.3–1.2)
Total Protein: 7.1 g/dL (ref 6.5–8.1)

## 2020-08-06 LAB — ACETAMINOPHEN LEVEL: Acetaminophen (Tylenol), Serum: <10 ug/mL — ABNORMAL LOW (ref 10–30)

## 2020-08-06 LAB — I-STAT BETA HCG BLOOD, ED (MC, WL, AP ONLY): I-stat hCG, quantitative: <5 m[IU]/mL (ref <5)

## 2020-08-06 LAB — SALICYLATE LEVEL: Salicylate Lvl: <7 mg/dL — ABNORMAL LOW (ref 7.0–30.0)

## 2020-08-06 NOTE — ED Notes (Signed)
Pt states she can not wait any longer and had to go pick up her child. Pt seen leaving

## 2020-08-06 NOTE — ED Triage Notes (Addendum)
Pt reports she's not getting good sleep after having her baby 4 months ago. Her PCP from [facility] prescribed her Ambien and she is requesting a refill of that prescription. Pt reports she's having anxiety attacks since stopping meds.  Update, pt reports intermittent "heart palpitations"

## 2020-08-07 ENCOUNTER — Other Ambulatory Visit: Payer: Self-pay

## 2020-08-07 ENCOUNTER — Ambulatory Visit (HOSPITAL_COMMUNITY)
Admission: EM | Admit: 2020-08-07 | Discharge: 2020-08-07 | Disposition: A | Discharge status: Home / Self Care | Payer: Medicaid Other | Attending: Family Medicine | Admitting: Family Medicine

## 2020-08-07 ENCOUNTER — Encounter (HOSPITAL_COMMUNITY): Payer: Self-pay

## 2020-08-07 DIAGNOSIS — R002 Palpitations: Secondary | ICD-10-CM | POA: Diagnosis present

## 2020-08-07 DIAGNOSIS — R7989 Other specified abnormal findings of blood chemistry: Secondary | ICD-10-CM | POA: Diagnosis present

## 2020-08-07 LAB — TSH: TSH: 0.284 u[IU]/mL — ABNORMAL LOW (ref 0.350–4.500)

## 2020-08-07 MED ORDER — HYDROXYZINE HCL 25 MG PO TABS: 25.0000 mg | ORAL_TABLET | Freq: Three times a day (TID) | ORAL | 0 refills | Status: DC | PRN

## 2020-08-07 NOTE — ED Provider Notes (Signed)
[facility]    CSN: [id number] Arrival date & time: 08/07/20  1056      History   Chief Complaint Chief Complaint  Patient presents with  . Palpitations    HPI Joyce Solomon is a 34 y.o. female.   HPI  Patient presents today for evaluation of palpitations increasing in frequency x5 days.  Patient endorses anxiety and she is recently postpartum with the birth of her increases her fourth child.  She endorses some home stressors however also complains of an inability to relax and sleep.  Her primary care provider has started her on Ambien 10 mg and she had been taking that up until few days ago as she thought this may have been the cause of her palpitations.  In review of her EMR patient has had chronically low levels of TSH hormone and has not had any adequate work-up or management of possible hypothyroidism.  Patient was unaware of these findings.  Patient has had some gestational hypertension although denies any knowledge of being hypertensive outside of being pregnant.  She denies any chest pain or shortness of breath.   Past Medical History:  Diagnosis Date  . Allergy    SEASONAL  . Blood transfusion without reported diagnosis   . Headache   . Iron Deficiency Anemia 2008   s/p transfusions 2009, 2010; inconsistent compliance with PO iron.   . Non compliance w medication regimen 06/2015   stopped Remicade, may not have been taking Azulfidine.  . Thyrotoxicosis   . Ulcerative Colitis 2007   on Humira    Patient Active Problem List   Diagnosis Date Noted  . C. difficile diarrhea 03/24/2020  . History of 2 cesarean sections 03/24/2020  . Postpartum care following cesarean delivery 03/24/2020  . Group B Streptococcus carrier, +RV culture, currently pregnant 03/14/2020  . Supervision of high risk pregnancy, antepartum 09/09/2019  . Anal fissure 09/11/2018  . Rectal bleeding 04/23/2014  . History of cesarean delivery affecting pregnancy 12/10/2013  . Prior  pregnancy complicated by SGA (small for gestational age), antepartum 12/09/2013  . DENTAL CARIES 12/22/2009  . Thyrotoxicosis 08/03/2008  . Constipation 07/29/2008  . ACNE, MILD 07/29/2008  . Anemia 06/30/2008  . Ulcerative colitis ([place]) 11/20/2005    Past Surgical History:  Procedure Laterality Date  . CESAREAN SECTION N/A 12/09/2013   Procedure: Primary Cesarean Section Delivery Baby Girl @ 1959, Apgars 8/8;  Surgeon: Logan Bores, MD;  Location: [facility];  Service: Obstetrics;  Laterality: N/A;  . CESAREAN SECTION N/A 08/14/2017   Procedure: CESAREAN SECTION;  Surgeon: Paula Compton, MD;  Location: [facility];  Service: Obstetrics;  Laterality: N/A;  . CESAREAN SECTION N/A 03/23/2020   Procedure: CESAREAN SECTION;  Surgeon: Osborne Oman, MD;  Location: [facility];  Service: Obstetrics;  Laterality: N/A;  . COLONOSCOPY  01/19/2011   Dr Penelope Coop. no active UC  . COLONOSCOPY W/ BIOPSIES N/A 04/2014   Dr Deatra Ina: Left sided colitis from prox descending to rectum; no inflammation at or proximal to splenic flexure  . DILATION AND EVACUATION  08/21/2012   Procedure: DILATATION AND EVACUATION;  Surgeon: Logan Bores, MD;  Location: [facility];  Service: Gynecology  . TONSILLECTOMY      OB History    Gravida  6   Para  4   Term  4   Preterm      AB  2   Living  4     SAB  1  TAB  1   Ectopic      Multiple  0   Live Births  4            Home Medications    Prior to Admission medications   Medication Sig Start Date End Date Taking? Authorizing Provider  HUMIRA PEN 40 MG/0.4ML PNKT INJECT 40 MG INTO THE SKIN EVERY 14 (FOURTEEN) DAYS. 03/31/19 09/17/19  Mauri Pole, MD    Family History Family History  Problem Relation Age of Onset  . Miscarriages / Stillbirths Other   . Diabetes Sister   . Hypotension Neg Hx   . Alcohol abuse Neg Hx   . Arthritis Neg Hx   . Asthma Neg Hx   . Birth defects Neg Hx   . COPD Neg Hx   . Depression Neg  Hx   . Drug abuse Neg Hx   . Hearing loss Neg Hx   . Heart disease Neg Hx   . Hyperlipidemia Neg Hx   . Hypertension Neg Hx   . Kidney disease Neg Hx   . Learning disabilities Neg Hx   . Mental illness Neg Hx   . Mental retardation Neg Hx   . Colon cancer Neg Hx   . Colon polyps Neg Hx   . Esophageal cancer Neg Hx   . Stomach cancer Neg Hx   . Rectal cancer Neg Hx     Social History Social History   Tobacco Use  . Smoking status: Former Smoker    Years: 3.00    Quit date: 12/13/2018    Years since quitting: 1.6  . Smokeless tobacco: Never Used  . Tobacco comment: smokes a cig "every blue moon"  Vaping Use  . Vaping Use: Never used  Substance Use Topics  . Alcohol use: Not Currently    Alcohol/week: 0.0 standard drinks    Comment: occassional  . Drug use: No     Allergies   Latex and Remicade [infliximab]   Review of Systems Review of Systems Pertinent negatives listed in HPI  Physical Exam Triage Vital Signs ED Triage Vitals  Enc Vitals Group     BP 08/07/20 1120 126/74     Pulse Rate 08/07/20 1103 72     Resp 08/07/20 1103 18     Temp 08/07/20 1120 98.3 F (36.8 C)     Temp Source 08/07/20 1120 Oral     SpO2 08/07/20 1103 97 %     Weight --      Height --      Head Circumference --      Peak Flow --      Pain Score 08/07/20 1121 0     Pain Loc --      Pain Edu? --      Excl. in [place]? --    No data found.  Updated Vital Signs BP 126/74 (BP Location: Right Arm)   Pulse 74   Temp 98.3 F (36.8 C) (Oral)   Resp 16   SpO2 100%   Visual Acuity Right Eye Distance:   Left Eye Distance:   Bilateral Distance:    Right Eye Near:   Left Eye Near:    Bilateral Near:     Physical Exam Constitutional:      Appearance: Normal appearance.  Cardiovascular:     Rate and Rhythm: Normal rate and regular rhythm.  Pulmonary:     Effort: Pulmonary effort is normal.     Breath sounds: Normal breath sounds.  Musculoskeletal:  Cervical back: Normal  range of motion. Normal range of motion.  Neurological:     Mental Status: She is alert.  Psychiatric:        Mood and Affect: Mood is anxious.       UC Treatments / Results  Labs (all labs ordered are listed, but only abnormal results are displayed) Labs Reviewed - No data to display  EKG Sinus rhythm, normal rate no ST changes  Radiology No results found.  Procedures Procedures (including critical care time)  Medications Ordered in UC Medications - No data to display  Initial Impression / Assessment and Plan / UC Course  I have reviewed the triage vital signs and the nursing notes.  Pertinent labs & imaging results that were available during my care of the patient were reviewed by me and considered in my medical decision making (see chart for details).    Start hydroxyzine as prescribed for anxiety.  Ambien prescribed by PCP recommend having dose to see if that helps with palpitations if not discussed with PCP regarding a different alternative to sleep medication.  Checking a TSH as she have a history of prolonged low TSH and this has not been appropriately worked up.  We will check a TSH level and refer to endocrinology if needed. Final Clinical Impressions(s) / UC Diagnoses   Final diagnoses:  Low TSH level  Palpitations     Discharge Instructions     I will follow up with you regarding the results of your thyroid study.  I sent over hydroxyzine to help with your anxiety symptoms.  Recommend the Ambien that you were prescribed only take half the dose as I am cannot rule out the medication has been a cause of the palpitations however given that your thyroid function has been low in the past we need to also check this to rule out hyperthyroidism as a cause of your symptoms.    ED Prescriptions    Medication Sig Dispense Auth. Provider   hydrOXYzine (ATARAX/VISTARIL) 25 MG tablet Take 1 tablet (25 mg total) by mouth every 8 (eight) hours as needed for anxiety. 20  tablet Scot Jun, FNP     PDMP not reviewed this encounter.   Scot Jun, FNP 08/08/20 1155

## 2020-08-07 NOTE — ED Triage Notes (Signed)
Pt checked in waiting area; c/o sensation of heart palpitations x 5 days intermittently, worse when laying down. Upon palpation of pulse, heart rate regular with occasional skipped beats (approx 2 per minute).  Skin warm, pink.  Pt c/o slight chest tightness and sensation of palpations; denies other c/o's. Triage CMA notified to obtain EKG after current triage patient.  Pt updated on plan of care.

## 2020-08-07 NOTE — Discharge Instructions (Addendum)
I will follow up with you regarding the results of your thyroid study.  I sent over hydroxyzine to help with your anxiety symptoms.  Recommend the Ambien that you were prescribed only take half the dose as I am cannot rule out the medication has been a cause of the palpitations however given that your thyroid function has been low in the past we need to also check this to rule out hyperthyroidism as a cause of your symptoms.

## 2020-08-08 ENCOUNTER — Telehealth (HOSPITAL_COMMUNITY): Payer: Self-pay | Admitting: Family Medicine

## 2020-08-08 DIAGNOSIS — R7989 Other specified abnormal findings of blood chemistry: Secondary | ICD-10-CM

## 2020-08-08 DIAGNOSIS — R002 Palpitations: Secondary | ICD-10-CM

## 2020-08-08 NOTE — Telephone Encounter (Signed)
Encounter opened due to abnormal TSH level.  See lab result note from encounter visit on 08/07/2020 at [facility] urgent care.  Patient is being referred emergently to endocrinology for work-up of what appears to be hypothyroidism chronically

## 2020-08-11 ENCOUNTER — Ambulatory Visit (INDEPENDENT_AMBULATORY_CARE_PROVIDER_SITE_OTHER): Payer: Medicaid Other | Admitting: Internal Medicine

## 2020-08-11 ENCOUNTER — Encounter: Payer: Self-pay | Admitting: Internal Medicine

## 2020-08-11 ENCOUNTER — Other Ambulatory Visit: Payer: Self-pay

## 2020-08-11 VITALS — BP 108/68 | HR 70 | Ht 59.0 in | Wt 132.2 lb

## 2020-08-11 DIAGNOSIS — E0590 Thyrotoxicosis, unspecified without thyrotoxic crisis or storm: Secondary | ICD-10-CM

## 2020-08-11 LAB — T4, FREE: Free T4: 0.71 ng/dL (ref 0.60–1.60)

## 2020-08-11 LAB — TSH: TSH: 0.36 u[IU]/mL (ref 0.35–4.50)

## 2020-08-11 NOTE — Patient Instructions (Signed)
-   Labs today   -  Thyroid Uptake and Scan works like this: We would first check a thyroid "scan" (a special, but easy and painless type of thyroid x ray).  you go to the x-ray department of the hospital to swallow a pill, which contains a miniscule amount of radiation.  You will not notice any symptoms from this.  You will go back to the x-ray department the next day, to lie down in front of a camera.  The results of this will be sent to me.

## 2020-08-11 NOTE — Progress Notes (Signed)
Name: Joyce Solomon  MRN/ DOB: [id number], [birth year]    Age/ Sex: 34 y.o., female    PCP: [facility]   Reason for Endocrinology Evaluation: Subclinical hyperthyrodisim     Date of Initial Endocrinology Evaluation: 08/11/2020     HPI: Joyce Solomon is a 34 y.o. female with a past medical history of Ulcerative colitis . The patient presented for initial endocrinology clinic visit on 08/11/2020 for consultative assistance with her subclinical hyperthyroidism.   She has been noted with low TSH since 2009, with a nadir of < 0.005 uIU/mL and historically with normal FT4.  She presented to the ED with palpitations on 08/07/2020 which was attributed to anxiety .    Today she denies weight loss.  Denies diarrhea  Has occasional tremors, palpitations and anxiety   LMP 9/9th,  2021 regular      No FH of thyroid disease     HISTORY:  Past Medical History:  Past Medical History:  Diagnosis Date   Allergy    SEASONAL   Blood transfusion without reported diagnosis    Headache    Iron Deficiency Anemia 2008   s/p transfusions 2009, 2010; inconsistent compliance with PO iron.    Non compliance w medication regimen 06/2015   stopped Remicade, may not have been taking Azulfidine.   Thyrotoxicosis    Ulcerative Colitis 2007   on Humira    Past Surgical History:  Past Surgical History:  Procedure Laterality Date   CESAREAN SECTION N/A 12/09/2013   Procedure: Primary Cesarean Section Delivery Baby Girl @ 1959, Apgars 8/8;  Surgeon: Joyce Bores, MD;  Location: [facility];  Service: Obstetrics;  Laterality: N/A;   CESAREAN SECTION N/A 08/14/2017   Procedure: CESAREAN SECTION;  Surgeon: Joyce Compton, MD;  Location: [facility];  Service: Obstetrics;  Laterality: N/A;   CESAREAN SECTION N/A 03/23/2020   Procedure: CESAREAN SECTION;  Surgeon: Joyce Oman, MD;  Location: [facility];  Service: Obstetrics;  Laterality: N/A;   COLONOSCOPY   01/19/2011   Dr Joyce Solomon. no active UC   COLONOSCOPY W/ BIOPSIES N/A 04/2014   Dr Joyce Solomon: Left sided colitis from prox descending to rectum; no inflammation at or proximal to splenic flexure   DILATION AND EVACUATION  08/21/2012   Procedure: DILATATION AND EVACUATION;  Surgeon: Joyce Bores, MD;  Location: [facility];  Service: Gynecology   TONSILLECTOMY        Social History:  reports that she quit smoking about 19 months ago. She quit after 3.00 years of use. She has never used smokeless tobacco. She reports previous alcohol use. She reports that she does not use drugs.  Family History: family history includes Diabetes in her sister; Miscarriages / [place] in an other family member.   HOME MEDICATIONS: Allergies as of 08/11/2020      Reactions   Latex Rash   Remicade [infliximab] Itching      Medication List       Accurate as of August 11, 2020  8:05 AM. If you have any questions, ask your nurse or doctor.        hydrOXYzine 25 MG tablet Commonly known as: ATARAX/VISTARIL Take 1 tablet (25 mg total) by mouth every 8 (eight) hours as needed for anxiety.         REVIEW OF SYSTEMS: A comprehensive ROS was conducted with the patient and is negative except as per HPI     OBJECTIVE:  VS: BP 108/68 (BP  Location: Left Arm, Patient Position: Sitting, Cuff Size: Normal)    Pulse 70    Ht 4' 11"  (1.499 m)    Wt 132 lb 3.2 oz (60 kg)    SpO2 95%    BMI 26.70 kg/m    Wt Readings from Last 3 Encounters:  07/19/20 130 lb (59 kg)  06/17/20 132 lb (59.9 kg)  06/01/20 130 lb (59 kg)     EXAM: General: Pt appears well and is in NAD  Neck: General: Supple without adenopathy. Thyroid: Thyroid size normal.  No goiter or nodules appreciated. No thyroid bruit.  Lungs: Clear with good BS bilat with no rales, rhonchi, or wheezes  Heart: Auscultation: RRR.  Abdomen: Normoactive bowel sounds, soft, nontender, without masses or organomegaly palpable  Extremities:  BL LE: No  pretibial edema normal ROM and strength.  Skin: Hair: Texture and amount normal with gender appropriate distribution Skin Inspection: No rashes Skin Palpation: Skin temperature, texture, and thickness normal to palpation  Neuro: Cranial nerves: II - XII grossly intact  Motor: Normal strength throughout DTRs: 2+ and symmetric in UE without delay in relaxation phase  Mental Status: Judgment, insight: Intact Orientation: Oriented to time, place, and person Mood and affect: No depression, anxiety, or agitation     DATA REVIEWED: Results for Joyce, Solomon (MRN [id number]) as of 08/12/2020 09:21  Ref. Range 08/11/2020 11:46  TSH Latest Ref Range: 0.35 - 4.50 uIU/mL 0.36  Triiodothyronine (T3) Latest Ref Range: 76 - 181 ng/dL 125  T4,Free(Direct) Latest Ref Range: 0.60 - 1.60 ng/dL 0.71      ASSESSMENT/PLAN/RECOMMENDATIONS:   1. Subclinical hyperthyroidism:  - Pt with non-specific symptoms that are NOT atributed to her thyroid as repeat labs are normal. - Discussed differential diagnosis of graves' disease, autonomous thyroid  Nodule (s) vs a  Normal variant given chronicity of the condition - I am going to check TRAb levels for Grave's disease - We initially discussed proceeding with thyroid uptake and scan but will has been cancelled when her TFT's came back normal.  - Discussed the importance of contraception and avoiding pregnancy at this time    - Will continue to monitor.   F/U in 3 months   Signed electronically by: Joyce Guise, MD  [facility] Endocrinology  [facility] [address] [place], [place] [address] Phone: [phone] FAX: [phone]   CC: [place] [address] [place] [address] Phone: [phone] Fax: [phone]   Return to Endocrinology clinic as below: Future Appointments  Date Time Provider [place]  08/11/2020 11:30 AM Joyce Solomon, Melanie Crazier, MD [facility] None

## 2020-08-12 ENCOUNTER — Telehealth: Payer: Self-pay

## 2020-08-12 DIAGNOSIS — E0590 Thyrotoxicosis, unspecified without thyrotoxic crisis or storm: Secondary | ICD-10-CM | POA: Insufficient documentation

## 2020-08-12 NOTE — Telephone Encounter (Signed)
Chart reviewed:  Hi [name] Your thyroid hormone levels are normal at this time, which is great news ,but I am going to cancel the thyroid scan that we talked about because this test is invalid with normal thyroid levels.  Written by Cloyd Stagers, MD on 08/12/2020 8:51 AM EDT Seen by patient Joyce Solomon on 08/12/2020 1:48 PM

## 2020-08-12 NOTE — Telephone Encounter (Signed)
Pt called states [facility] canceled scan pt was to have today. Pt has questions regarding & request return call. Pt [phone].

## 2020-08-14 LAB — TRAB (TSH RECEPTOR BINDING ANTIBODY): TRAB: <1 IU/L (ref <=2.00)

## 2020-08-14 LAB — T3: T3, Total: 125 ng/dL (ref 76–181)

## 2020-08-19 ENCOUNTER — Telehealth: Payer: Self-pay | Admitting: Gastroenterology

## 2020-08-24 ENCOUNTER — Telehealth: Payer: Self-pay | Admitting: Gastroenterology

## 2020-08-24 ENCOUNTER — Other Ambulatory Visit: Payer: Self-pay

## 2020-08-24 NOTE — Telephone Encounter (Signed)
Called [facility]. Rep said they called [place] 2 weeks earlier and spoke with "Lattie Haw" to let us know the patient would not be getting her infusions through them because they do not have a [place] team for this service.  Called the patient to let her know this and apologize for the delay. She is due an Entyvio infusion 09/13/20.  Called [facility] Short Stay. Left a message on the voicemail asking for an appointment by 09/13/20 for the infusion. [place] to inquire on insurances accepted. Tim of the [place] said I would need to fax the referral in order for this to be determined. Referral faxed after confirming they were still using the forms we have on hand. Called the patient and gave her this information.

## 2020-08-25 NOTE — Telephone Encounter (Signed)
Spoke with the patient and gave her an update on the referral. No response yet from [facility]). Patient is scheduled with [facility] 09/13/20 at 12:00 pm. She agrees to this date for Entyvio infusion.

## 2020-08-26 ENCOUNTER — Other Ambulatory Visit: Payer: Self-pay

## 2020-08-26 MED ORDER — ENTYVIO 300 MG IV SOLR: 300.0000 mg | INTRAVENOUS | 6 refills | Status: DC

## 2020-08-26 NOTE — Telephone Encounter (Signed)
If she calls again, get a corrected phone number. This was not her number. Thanks

## 2020-08-26 NOTE — Telephone Encounter (Signed)
Returned the call. No answer. Left a message in the voicemail with my direct number.

## 2020-08-26 NOTE — Telephone Encounter (Signed)
Pam from [facility] called for you in reference to this patient [phone]

## 2020-09-08 ENCOUNTER — Telehealth: Payer: Self-pay | Admitting: Gastroenterology

## 2020-09-08 NOTE — Telephone Encounter (Signed)
[facility] is requesting a pre auth on pt'sEntyvio.  [facility] [place]: [phone]

## 2020-09-08 NOTE — Telephone Encounter (Signed)
Spoke with Joyce Solomon.  Faxed to her a copy of a correspondence with [employer]. It indicated that the [facility] is already approved. Case #[id number]

## 2020-09-10 ENCOUNTER — Other Ambulatory Visit: Payer: Self-pay

## 2020-09-13 ENCOUNTER — Encounter (HOSPITAL_COMMUNITY): Payer: Medicaid Other

## 2020-09-13 ENCOUNTER — Ambulatory Visit (HOSPITAL_COMMUNITY)
Admission: RE | Admit: 2020-09-13 | Discharge: 2020-09-13 | Disposition: A | Discharge status: Home / Self Care | Payer: Medicaid Other | Source: Ambulatory Visit | Attending: Gastroenterology | Admitting: Gastroenterology

## 2020-09-13 ENCOUNTER — Other Ambulatory Visit: Payer: Self-pay

## 2020-09-13 DIAGNOSIS — K51311 Ulcerative (chronic) rectosigmoiditis with rectal bleeding: Secondary | ICD-10-CM | POA: Insufficient documentation

## 2020-09-13 MED ORDER — VEDOLIZUMAB 300 MG IV SOLR
300.0000 mg | INTRAVENOUS | Status: DC
  Administered 2020-09-13: 300 mg via INTRAVENOUS
  Filled 2020-09-13: qty 5

## 2020-10-26 ENCOUNTER — Telehealth: Payer: Self-pay | Admitting: Gastroenterology

## 2020-10-26 NOTE — Telephone Encounter (Signed)
Spoke with julie at [facility]. Her notes show the infusion team has been calling the patient and have not been successful in reaching her. She has not returned the calls.  Spoke with the patient. She states she does have voicemail. Provided her with the phone number to call [phone] to get in touch with [facility] and arrange the appointment.

## 2020-11-08 ENCOUNTER — Encounter (HOSPITAL_COMMUNITY): Admission: RE | Admit: 2020-11-08 | Payer: Medicaid Other | Source: Ambulatory Visit

## 2020-11-22 ENCOUNTER — Ambulatory Visit: Payer: Medicaid Other | Admitting: Internal Medicine

## 2020-11-22 NOTE — Progress Notes (Deleted)
Name: Joyce Solomon  MRN/ DOB: [id number], [birth year]    Age/ Sex: 35 y.o., female     PCP: [facility] [place]   Reason for Endocrinology Evaluation: Subclinical hyperthyroidism     Initial Endocrinology Clinic Visit: 08/11/2020    PATIENT IDENTIFIER: Joyce Solomon is a 35 y.o., female with a past medical history of Ulcerative Colitis. She has followed with [facility] since 08/11/2020 for consultative assistance with management of her subclinical hyperthyroidism.   HISTORICAL SUMMARY:  She has been noted with low TSH since 2009, with a nadir of < 0.005 uIU/mL and historically with normal FT4.  She presented to the ED with palpitations on 08/07/2020 which was attributed to anxiety .  TRAB was negative    No FH of thyroid disease   SUBJECTIVE:    Today (11/22/2020):  Joyce Solomon is here for subclinical hyperthyroidism   Today she denies weight loss.  Denies diarrhea  Has occasional tremors, palpitations and anxiety   LMP 9/9th,  2021 regular    HISTORY:  Past Medical History:  Past Medical History:  Diagnosis Date  . Allergy    SEASONAL  . Blood transfusion without reported diagnosis   . Headache   . Iron Deficiency Anemia 2008   s/p transfusions 2009, 2010; inconsistent compliance with PO iron.   . Non compliance w medication regimen 06/2015   stopped Remicade, may not have been taking Azulfidine.  . Thyrotoxicosis   . Ulcerative Colitis 2007   on Humira    Past Surgical History:  Past Surgical History:  Procedure Laterality Date  . CESAREAN SECTION N/A 12/09/2013   Procedure: Primary Cesarean Section Delivery Baby Girl @ 1959, Apgars 8/8;  Surgeon: Logan Bores, MD;  Location: [facility];  Service: Obstetrics;  Laterality: N/A;  . CESAREAN SECTION N/A 08/14/2017   Procedure: CESAREAN SECTION;  Surgeon: Paula Compton, MD;  Location: [facility];  Service: Obstetrics;  Laterality: N/A;  . CESAREAN SECTION N/A 03/23/2020    Procedure: CESAREAN SECTION;  Surgeon: Osborne Oman, MD;  Location: [facility];  Service: Obstetrics;  Laterality: N/A;  . COLONOSCOPY  01/19/2011   Dr Penelope Coop. no active UC  . COLONOSCOPY W/ BIOPSIES N/A 04/2014   Dr Deatra Ina: Left sided colitis from prox descending to rectum; no inflammation at or proximal to splenic flexure  . DILATION AND EVACUATION  08/21/2012   Procedure: DILATATION AND EVACUATION;  Surgeon: Logan Bores, MD;  Location: [facility];  Service: Gynecology  . TONSILLECTOMY       Social History:  reports that she quit smoking about 1 years ago. She quit after 3.00 years of use. She has never used smokeless tobacco. She reports previous alcohol use. She reports that she does not use drugs. Family History:  Family History  Problem Relation Age of Onset  . Miscarriages / Stillbirths Other   . Diabetes Sister   . Hypotension Neg Hx   . Alcohol abuse Neg Hx   . Arthritis Neg Hx   . Asthma Neg Hx   . Birth defects Neg Hx   . COPD Neg Hx   . Depression Neg Hx   . Drug abuse Neg Hx   . Hearing loss Neg Hx   . Heart disease Neg Hx   . Hyperlipidemia Neg Hx   . Hypertension Neg Hx   . Kidney disease Neg Hx   . Learning disabilities Neg Hx   . Mental illness Neg Hx   .  Mental retardation Neg Hx   . Colon cancer Neg Hx   . Colon polyps Neg Hx   . Esophageal cancer Neg Hx   . Stomach cancer Neg Hx   . Rectal cancer Neg Hx       HOME MEDICATIONS: Allergies as of 11/22/2020      Reactions   Latex Rash   Remicade [infliximab] Itching      Medication List       Accurate as of November 22, 2020  2:06 PM. If you have any questions, ask your nurse or doctor.        Entyvio 300 MG injection Generic drug: vedolizumab Inject 300 mg into the vein every 8 (eight) weeks.   hydrOXYzine 25 MG tablet Commonly known as: ATARAX/VISTARIL Take 1 tablet (25 mg total) by mouth every 8 (eight) hours as needed for anxiety.         OBJECTIVE:   PHYSICAL EXAM: VS:  There were no vitals taken for this visit.   EXAM: General: Pt appears well and is in NAD  Hydration: Well-hydrated with moist mucous membranes and good skin turgor  Eyes: External eye exam normal without stare, lid lag or exophthalmos.  EOM intact.  PERRL.  Ears, Nose, Throat: Hearing: Grossly intact bilaterally Dental: Good dentition  Throat: Clear without mass, erythema or exudate  Neck: General: Supple without adenopathy. Thyroid: Thyroid size normal.  No goiter or nodules appreciated. No thyroid bruit.  Lungs: Clear with good BS bilat with no rales, rhonchi, or wheezes  Heart: Auscultation: RRR.  Abdomen: Normoactive bowel sounds, soft, nontender, without masses or organomegaly palpable  Extremities: Gait and station: Normal gait  Digits and nails: No clubbing, cyanosis, petechiae, or nodes Head and neck: Normal alignment and mobility BL UE: Normal ROM and strength. BL LE: No pretibial edema normal ROM and strength.  Skin: Hair: Texture and amount normal with gender appropriate distribution Skin Inspection: No rashes, acanthosis nigricans/skin tags. No lipohypertrophy Skin Palpation: Skin temperature, texture, and thickness normal to palpation  Neuro: Cranial nerves: II - XII grossly intact  Cerebellar: Normal coordination and movement; no tremor Motor: Normal strength throughout DTRs: 2+ and symmetric in UE without delay in relaxation phase  Mental Status: Judgment, insight: Intact Orientation: Oriented to time, place, and person Memory: Intact for recent and remote events Mood and affect: No depression, anxiety, or agitation     DATA REVIEWED: ***    ASSESSMENT / PLAN / RECOMMENDATIONS:   1. ***  Plan:  ***    Medications   ***   Signed electronically by: Mack Guise, MD  [facility] Endocrinology  [facility] [address] [place], [place] [address] Phone: [phone] FAX: [phone]      CC: [place], [place] [address] [place] [address] Phone: [phone]  Fax: [phone]   Return to Endocrinology clinic as below: Future Appointments  Date Time Provider [place]  11/22/2020  3:00 PM Alica Shellhammer, Melanie Crazier, MD [facility] None

## 2020-12-28 ENCOUNTER — Telehealth: Payer: Self-pay | Admitting: Gastroenterology

## 2020-12-28 NOTE — Telephone Encounter (Signed)
Spoke with Joyce Solomon. She has received the Entyvio and the infusion equipment. The infusion nurse has not come to her. She was told to expect an infusion by 12/20/20. She has not been contacted by anyone. I called [facility]. The company is only able to confirm the patient received the Entyvio. She was unable to find an infusion date. She will contact the nursing department and "get back to " me. Joyce Solomon is okay with me looking to another infusion center. Home infusion service does not seem to be working for her.

## 2020-12-28 NOTE — Telephone Encounter (Signed)
Pt states that last time that she had Entyvio infusion was this past January. It was administer at pt's house. Pt states that she was supposed to have another one after that but nobody showed up the day of her appt. Pt is requesting if there is another medication that she can have and administer herself so she does not have to depend on anybody. Pls call her.

## 2020-12-30 NOTE — Telephone Encounter (Signed)
[employer] calling states they received a referral for patients infusions but the patient told them she has some lese coming to her home tomorrow. [phone]

## 2020-12-30 NOTE — Telephone Encounter (Signed)
Cancelled the referral to [facility] after confirming with the patient when the nurse is scheduled to come to her to do the infusion. She tells me it is to be 12/31/20 at 7:00 pm. Agrees to call me on 01/03/21 if the nurse is a no show. Patient last seen in July of 2021. Follow up was to have been in October. She agrees to come in for follow up on 01/04/21 at 10:10.

## 2021-01-04 ENCOUNTER — Ambulatory Visit: Payer: Medicaid Other | Admitting: Gastroenterology

## 2021-01-04 ENCOUNTER — Other Ambulatory Visit: Payer: Self-pay

## 2021-01-04 ENCOUNTER — Telehealth: Payer: Self-pay | Admitting: Gastroenterology

## 2021-01-04 NOTE — Telephone Encounter (Signed)
Left message. Services are not needed. Please cancel the referral.

## 2021-03-08 ENCOUNTER — Other Ambulatory Visit: Payer: Self-pay

## 2021-03-08 ENCOUNTER — Ambulatory Visit (HOSPITAL_COMMUNITY)
Admission: EM | Admit: 2021-03-08 | Discharge: 2021-03-08 | Disposition: A | Discharge status: Home / Self Care | Payer: Medicaid Other | Attending: Student | Admitting: Student

## 2021-03-08 ENCOUNTER — Encounter (HOSPITAL_COMMUNITY): Payer: Self-pay | Admitting: Emergency Medicine

## 2021-03-08 DIAGNOSIS — G43001 Migraine without aura, not intractable, with status migrainosus: Secondary | ICD-10-CM | POA: Diagnosis not present

## 2021-03-08 LAB — C-REACTIVE PROTEIN: CRP: 1 mg/dL — ABNORMAL HIGH (ref <1.0)

## 2021-03-08 LAB — SEDIMENTATION RATE: Sed Rate: 33 mm/hr — ABNORMAL HIGH (ref 0–22)

## 2021-03-08 MED ORDER — KETOROLAC TROMETHAMINE 60 MG/2ML IM SOLN
INTRAMUSCULAR | Status: AC
  Filled 2021-03-08: qty 2

## 2021-03-08 MED ORDER — KETOROLAC TROMETHAMINE 60 MG/2ML IM SOLN
60.0000 mg | Freq: Once | INTRAMUSCULAR | Status: AC
  Administered 2021-03-08: 60 mg via INTRAMUSCULAR

## 2021-03-08 MED ORDER — PREDNISONE 20 MG PO TABS: 40.0000 mg | ORAL_TABLET | Freq: Every day | ORAL | 0 refills | Status: AC

## 2021-03-08 NOTE — ED Triage Notes (Signed)
Pt c/o intermittent headache on right side onset 2 days  Denies f/v/n/d  Reports headache increases w/bright lights  Taking acetaminophen w/no relief.   A&O x4... NAD.[name]. ambulatory

## 2021-03-08 NOTE — ED Provider Notes (Addendum)
[place]    CSN: [id number] Arrival date & time: 03/08/21  1704      History   Chief Complaint Chief Complaint  Patient presents with  . Headache    HPI Joyce Solomon is a 35 y.o. female presenting with headache x2 days.  Medical history headaches, thyrotoxicosis.  Describes headache as right-sided and throbbing.  Some photophobia. Improved with tylenol but not resolved. Denies worst headache of life, thunderclap headache, weakness/sensation changes in arms/legs, vision changes, vision loss, shortness of breath, chest pain/pressure, phonophobia, n/v/d, dizziness, neck stiffness.    HPI  Past Medical History:  Diagnosis Date  . Allergy    SEASONAL  . Blood transfusion without reported diagnosis   . Headache   . Iron Deficiency Anemia 2008   s/p transfusions 2009, 2010; inconsistent compliance with PO iron.   . Non compliance w medication regimen 06/2015   stopped Remicade, may not have been taking Azulfidine.  . Thyrotoxicosis   . Ulcerative Colitis 2007   on Humira    Patient Active Problem List   Diagnosis Date Noted  . Subclinical hyperthyroidism 08/12/2020  . C. difficile diarrhea 03/24/2020  . History of 2 cesarean sections 03/24/2020  . Postpartum care following cesarean delivery 03/24/2020  . Group B Streptococcus carrier, +RV culture, currently pregnant 03/14/2020  . Supervision of high risk pregnancy, antepartum 09/09/2019  . Anal fissure 09/11/2018  . Rectal bleeding 04/23/2014  . History of cesarean delivery affecting pregnancy 12/10/2013  . Prior pregnancy complicated by SGA (small for gestational age), antepartum 12/09/2013  . DENTAL CARIES 12/22/2009  . Thyrotoxicosis 08/03/2008  . Constipation 07/29/2008  . ACNE, MILD 07/29/2008  . Anemia 06/30/2008  . Ulcerative colitis ([place]) 11/20/2005    Past Surgical History:  Procedure Laterality Date  . CESAREAN SECTION N/A 12/09/2013   Procedure: Primary Cesarean Section Delivery Baby  Girl @ 1959, Apgars 8/8;  Surgeon: Logan Bores, MD;  Location: [facility];  Service: Obstetrics;  Laterality: N/A;  . CESAREAN SECTION N/A 08/14/2017   Procedure: CESAREAN SECTION;  Surgeon: Paula Compton, MD;  Location: [facility];  Service: Obstetrics;  Laterality: N/A;  . CESAREAN SECTION N/A 03/23/2020   Procedure: CESAREAN SECTION;  Surgeon: Osborne Oman, MD;  Location: [facility];  Service: Obstetrics;  Laterality: N/A;  . COLONOSCOPY  01/19/2011   Dr Penelope Coop. no active UC  . COLONOSCOPY W/ BIOPSIES N/A 04/2014   Dr Deatra Ina: Left sided colitis from prox descending to rectum; no inflammation at or proximal to splenic flexure  . DILATION AND EVACUATION  08/21/2012   Procedure: DILATATION AND EVACUATION;  Surgeon: Logan Bores, MD;  Location: [facility];  Service: Gynecology  . TONSILLECTOMY      OB History    Gravida  6   Para  4   Term  4   Preterm      AB  2   Living  4     SAB  1   IAB  1   Ectopic      Multiple  0   Live Births  4            Home Medications    Prior to Admission medications   Medication Sig Start Date End Date Taking? Authorizing Provider  predniSONE (DELTASONE) 20 MG tablet Take 2 tablets (40 mg total) by mouth daily for 5 days. 03/08/21 03/13/21 Yes Hazel Sams, PA-C  hydrOXYzine (ATARAX/VISTARIL) 25 MG tablet Take 1 tablet (25 mg total)  by mouth every 8 (eight) hours as needed for anxiety. 08/07/20   Scot Jun, FNP  vedolizumab (ENTYVIO) 300 MG injection Inject 300 mg into the vein every 8 (eight) weeks. 08/26/20   Mauri Pole, MD    Family History Family History  Problem Relation Age of Onset  . Miscarriages / Stillbirths Other   . Diabetes Sister   . Hypotension Neg Hx   . Alcohol abuse Neg Hx   . Arthritis Neg Hx   . Asthma Neg Hx   . Birth defects Neg Hx   . COPD Neg Hx   . Depression Neg Hx   . Drug abuse Neg Hx   . Hearing loss Neg Hx   . Heart disease Neg Hx   . Hyperlipidemia Neg Hx    . Hypertension Neg Hx   . Kidney disease Neg Hx   . Learning disabilities Neg Hx   . Mental illness Neg Hx   . Mental retardation Neg Hx   . Colon cancer Neg Hx   . Colon polyps Neg Hx   . Esophageal cancer Neg Hx   . Stomach cancer Neg Hx   . Rectal cancer Neg Hx     Social History Social History   Tobacco Use  . Smoking status: Former Smoker    Years: 3.00    Quit date: 12/13/2018    Years since quitting: 2.2  . Smokeless tobacco: Never Used  . Tobacco comment: smokes a cig "every blue moon"  Vaping Use  . Vaping Use: Never used  Substance Use Topics  . Alcohol use: Not Currently    Alcohol/week: 0.0 standard drinks    Comment: occassional  . Drug use: No     Allergies   Latex and Remicade [infliximab]   Review of Systems Review of Systems  Constitutional: Negative for appetite change, chills, fatigue and fever.  HENT: Negative for congestion, sinus pressure, sore throat, trouble swallowing and voice change.   Eyes: Positive for photophobia. Negative for pain, discharge, redness, itching and visual disturbance.  Respiratory: Negative for cough, chest tightness and shortness of breath.   Cardiovascular: Negative for chest pain, palpitations and leg swelling.  Gastrointestinal: Negative for abdominal pain, constipation, diarrhea, nausea and vomiting.  Genitourinary: Negative for dysuria, flank pain, frequency and urgency.  Musculoskeletal: Negative for back pain, gait problem, myalgias, neck pain and neck stiffness.  Neurological: Positive for headaches. Negative for dizziness, tremors, seizures, syncope, facial asymmetry, speech difficulty, weakness, light-headedness and numbness.  Psychiatric/Behavioral: Negative for agitation, decreased concentration, dysphoric mood, hallucinations and suicidal ideas. The patient is not nervous/anxious.   All other systems reviewed and are negative.    Physical Exam Triage Vital Signs ED Triage Vitals  Enc Vitals Group      BP 03/08/21 1804 121/76     Pulse Rate 03/08/21 1804 98     Resp 03/08/21 1804 16     Temp 03/08/21 1804 98.2 F (36.8 C)     Temp Source 03/08/21 1804 Oral     SpO2 03/08/21 1804 99 %     Weight --      Height --      Head Circumference --      Peak Flow --      Pain Score 03/08/21 1805 7     Pain Loc --      Pain Edu? --      Excl. in [place]? --    No data found.  Updated Vital Signs BP 121/76 (BP  Location: Right Arm)   Pulse 98   Temp 98.2 F (36.8 C) (Oral)   Resp 16   LMP 03/06/2021   SpO2 99%   Breastfeeding No   Visual Acuity Right Eye Distance:   Left Eye Distance:   Bilateral Distance:    Right Eye Near:   Left Eye Near:    Bilateral Near:     Physical Exam Vitals reviewed.  Constitutional:      General: She is not in acute distress.    Appearance: Normal appearance. She is not ill-appearing.  HENT:     Head: Normocephalic and atraumatic.     Comments: Vision grossly intact Eyes:     Extraocular Movements: Extraocular movements intact.     Pupils: Pupils are equal, round, and reactive to light.  Cardiovascular:     Rate and Rhythm: Normal rate and regular rhythm.     Heart sounds: Normal heart sounds.     Comments: No tenderness or swelling along R temporal artery. No scalp tenderness.  Pulmonary:     Effort: Pulmonary effort is normal.     Breath sounds: Normal breath sounds. No wheezing, rhonchi or rales.  Musculoskeletal:     Cervical back: Normal range of motion and neck supple. No rigidity.  Lymphadenopathy:     Cervical: No cervical adenopathy.  Skin:    Capillary Refill: Capillary refill takes less than 2 seconds.  Neurological:     General: No focal deficit present.     Mental Status: She is alert and oriented to person, place, and time. Mental status is at baseline.     Cranial Nerves: Cranial nerves are intact. No cranial nerve deficit or facial asymmetry.     Sensory: Sensation is intact. No sensory deficit.     Motor: Motor function  is intact. No weakness.     Coordination: Coordination is intact. Coordination normal.     Gait: Gait is intact. Gait normal.     Comments: CN 2-12 intact. No weakness or numbness in UEs or LEs.  Psychiatric:        Mood and Affect: Mood normal.        Behavior: Behavior normal.        Thought Content: Thought content normal.        Judgment: Judgment normal.      UC Treatments / Results  Labs (all labs ordered are listed, but only abnormal results are displayed) Labs Reviewed  SEDIMENTATION RATE  C-REACTIVE PROTEIN    EKG   Radiology No results found.  Procedures Procedures (including critical care time)  Medications Ordered in UC Medications  ketorolac (TORADOL) injection 60 mg (has no administration in time range)    Initial Impression / Assessment and Plan / UC Course  I have reviewed the triage vital signs and the nursing notes.  Pertinent labs & imaging results that were available during my care of the patient were reviewed by me and considered in my medical decision making (see chart for details).     This patient is a 35 year old female presenting with migraine headache.  Neuro exam benign.  Toradol administered today.  She is not breast-feeding or pregnant.  Low suspicion for temporal arteritis based on presentation today.  However, following discussion about this, patient requests ESR and CRP.  We will also treat with prednisone 40 mg x 5 days.  She understands to head straight to the emergency room if she develops vision changes, vision loss.  Final Clinical Impressions(s) / UC Diagnoses  Final diagnoses:  Migraine without aura and with status migrainosus, not intractable     Discharge Instructions     -Prednisone 2 pills taken at the same time for 5 days.  Take this earlier in the day as it can give you energy. -You can also take Tylenol and ibuprofen for your headaches.  Avoid ibuprofen for the next 12 hours as we gave you a shot of an NSAID  today. -Seek additional medical attention if you develop new symptoms like vision changes, vision loss, worst headache of life, weakness, loss of consciousness, chest pain.    ED Prescriptions    Medication Sig Dispense Auth. Provider   predniSONE (DELTASONE) 20 MG tablet Take 2 tablets (40 mg total) by mouth daily for 5 days. 10 tablet Hazel Sams, PA-C     PDMP not reviewed this encounter.   Hazel Sams, PA-C 03/08/21 1847    Hazel Sams, PA-C 03/08/21 1850

## 2021-03-08 NOTE — Discharge Instructions (Signed)
-  Prednisone 2 pills taken at the same time for 5 days.  Take this earlier in the day as it can give you energy. -You can also take Tylenol and ibuprofen for your headaches.  Avoid ibuprofen for the next 12 hours as we gave you a shot of an NSAID today. -Seek additional medical attention if you develop new symptoms like vision changes, vision loss, worst headache of life, weakness, loss of consciousness, chest pain.

## 2021-04-07 ENCOUNTER — Other Ambulatory Visit: Payer: Medicaid Other

## 2021-04-07 ENCOUNTER — Ambulatory Visit (INDEPENDENT_AMBULATORY_CARE_PROVIDER_SITE_OTHER): Payer: Medicaid Other | Admitting: Gastroenterology

## 2021-04-07 ENCOUNTER — Encounter: Payer: Self-pay | Admitting: Gastroenterology

## 2021-04-07 VITALS — BP 102/68 | HR 95 | Ht 59.0 in | Wt 122.0 lb

## 2021-04-07 DIAGNOSIS — K51311 Ulcerative (chronic) rectosigmoiditis with rectal bleeding: Secondary | ICD-10-CM

## 2021-04-07 DIAGNOSIS — K51911 Ulcerative colitis, unspecified with rectal bleeding: Secondary | ICD-10-CM

## 2021-04-07 MED ORDER — DILTIAZEM GEL 2 %: 1.0000 "application " | Freq: Three times a day (TID) | CUTANEOUS | 1 refills | Status: DC

## 2021-04-07 NOTE — Patient Instructions (Signed)
Your provider has requested that you go to the basement level for lab work before leaving today. Press "B" on the elevator. The lab is located at the first door on the left as you exit the elevator.  Due to recent changes in healthcare laws, you may see the results of your imaging and laboratory studies on MyChart before your provider has had a chance to review them.  We understand that in some cases there may be results that are confusing or concerning to you. Not all laboratory results come back in the same time frame and the provider may be waiting for multiple results in order to interpret others.  Please give [place] 48 hours in order for your provider to thoroughly review all the results before contacting the office for clarification of your results.   Take Prednisone 40 mg daily for 2 weeks then decrease by 10 mg every week   I appreciate the  opportunity to care for you  Thank You   Harl Bowie , MD

## 2021-04-07 NOTE — Progress Notes (Signed)
Joyce Solomon    [id number]    1986/04/18  Primary Care Physician:[facility], [facility]  Referring Physician: Pa, [facility] [address] [place],  [place] [address]   Chief complaint:  IBD  HPI:  35 year old very pleasant female with history of ulcerative colitis here with complaints of increased bowel frequency with diarrhea alternating with constipation and rectal bleeding.  She has been noticing change in bowel habits in the past few months and it is progressively worse in the past few weeks, currently she is having 3-4 semiformed to liquid bowel movements almost daily.  She has bright red blood mixed in stool and also when she wipes after a bowel movement. Lower abdominal pain and bloating.  Denies any fever or sick contacts.  No nausea, vomiting, melena or upper abdominal pain.  She is on Entyvio infusion every 8 weeks  Last ulcerative colitis flare was during her pregnancy prior to initiation of Entyvio   Relevant GI history: She was in clinical remission on Humira, self discontinued after she became pregnant, restarted in Nov 2020and shestopped it againafter 3 monthsas she thought it was not helping  Colonoscopy Apr 10, 2018 negative for active colitis, chronic mucosal changes with altered vascularity and mild erythema. Random biopsies negative for active inflammation.  Drug trough 5.9 with undetectable antibody for Humira   Colonoscopy June 2015 showed moderately severe left-sided colitis  Allergic reaction to Remicade in 2016. Elevated antibody level to infliximab 1622 and undetectable drug level  Flexible sigmoidoscopy January 2017 showed severe colitis left colon with ulceration, biopsies showed moderately active colitis.  Started on Humira March 2017, and clinical remission since then   Outpatient Encounter Medications as of 04/07/2021  Medication Sig  . hydrOXYzine (ATARAX/VISTARIL) 25 MG tablet Take 1 tablet (25 mg total) by  mouth every 8 (eight) hours as needed for anxiety.  . vedolizumab (ENTYVIO) 300 MG injection Inject 300 mg into the vein every 8 (eight) weeks.   No facility-administered encounter medications on file as of 04/07/2021.    Allergies as of 04/07/2021 - Review Complete 04/07/2021  Allergen Reaction Noted  . Latex Rash 05/20/2009  . Remicade [infliximab] Itching 08/07/2015    Past Medical History:  Diagnosis Date  . Allergy    SEASONAL  . Blood transfusion without reported diagnosis   . Headache   . Iron Deficiency Anemia 2008   s/p transfusions 2009, 2010; inconsistent compliance with PO iron.   . Non compliance w medication regimen 06/2015   stopped Remicade, may not have been taking Azulfidine.  . Thyrotoxicosis   . Ulcerative Colitis 2007   on Humira    Past Surgical History:  Procedure Laterality Date  . CESAREAN SECTION N/A 12/09/2013   Procedure: Primary Cesarean Section Delivery Baby Girl @ 1959, Apgars 8/8;  Surgeon: Logan Bores, MD;  Location: [facility];  Service: Obstetrics;  Laterality: N/A;  . CESAREAN SECTION N/A 08/14/2017   Procedure: CESAREAN SECTION;  Surgeon: Paula Compton, MD;  Location: [facility];  Service: Obstetrics;  Laterality: N/A;  . CESAREAN SECTION N/A 03/23/2020   Procedure: CESAREAN SECTION;  Surgeon: Osborne Oman, MD;  Location: [facility];  Service: Obstetrics;  Laterality: N/A;  . COLONOSCOPY  01/19/2011   Dr Penelope Coop. no active UC  . COLONOSCOPY W/ BIOPSIES N/A 04/2014   Dr Deatra Ina: Left sided colitis from prox descending to rectum; no inflammation at or proximal to splenic flexure  . DILATION AND  EVACUATION  08/21/2012   Procedure: DILATATION AND EVACUATION;  Surgeon: Logan Bores, MD;  Location: [facility];  Service: Gynecology  . TONSILLECTOMY      Family History  Problem Relation Age of Onset  . Miscarriages / Stillbirths Other   . Diabetes Sister   . Hypotension Neg Hx   . Alcohol abuse Neg Hx   . Arthritis Neg Hx   .  Asthma Neg Hx   . Birth defects Neg Hx   . COPD Neg Hx   . Depression Neg Hx   . Drug abuse Neg Hx   . Hearing loss Neg Hx   . Heart disease Neg Hx   . Hyperlipidemia Neg Hx   . Hypertension Neg Hx   . Kidney disease Neg Hx   . Learning disabilities Neg Hx   . Mental illness Neg Hx   . Mental retardation Neg Hx   . Colon cancer Neg Hx   . Colon polyps Neg Hx   . Esophageal cancer Neg Hx   . Stomach cancer Neg Hx   . Rectal cancer Neg Hx     Social History   Socioeconomic History  . Marital status: Single    Spouse name: Not on file  . Number of children: 4  . Years of education: Not on file  . Highest education level: Not on file  Occupational History  . Occupation: unemployed  Tobacco Use  . Smoking status: Former Smoker    Years: 3.00    Quit date: 12/13/2018    Years since quitting: 2.3  . Smokeless tobacco: Never Used  . Tobacco comment: smokes a cig "every blue moon"  Vaping Use  . Vaping Use: Never used  Substance and Sexual Activity  . Alcohol use: Not Currently    Alcohol/week: 0.0 standard drinks    Comment: occassional  . Drug use: No  . Sexual activity: Yes    Birth control/protection: None  Other Topics Concern  . Not on file  Social History Narrative  . Not on file   Social Determinants of Health   Financial Resource Strain: Not on file  Food Insecurity: Not on file  Transportation Needs: Not on file  Physical Activity: Not on file  Stress: Not on file  Social Connections: Not on file  Intimate Partner Violence: Not on file      Review of systems: All other review of systems negative except as mentioned in the HPI.   Physical Exam: Vitals:   04/07/21 0815  BP: 102/68  Pulse: 95   Body mass index is 24.64 kg/m. Gen:      No acute distress HEENT:  sclera anicteric Abd:      soft, non-tender; no palpable masses, no distension Ext:    No edema Neuro: alert and oriented x 3 Psych: normal mood and affect Rectal exam: Increased  anal sphincter tone with tenderness, positive posterior anal fissure  Anoscopy: Not performed  Data Reviewed:  Reviewed labs, radiology imaging, old records and pertinent past GI work up   Assessment and Plan/Recommendations: 35 year old female with history of left-sided ulcerative colitis with acute flare of symptoms with diarrhea, lower abdominal pain and rectal bleeding  Check CBC, CMP, CRP, GI pathogen panel and fecal lactoferrin She is due for TB QuantiFERON gold for IBD health maintenance  Will also check Entyvio drug trough and antibody level, she is getting the every 8-week Entyvio infusion later today  If GI pathogen panel negative for active infection, start prednisone 40  mg daily for 2 weeks and then decrease by 10 mg every week after that  Anal fissure: Use diltiazem 2% gel small pea-sized amount per rectum 3 times daily for 6 to 8 weeks Avoid excessive straining during defecation  Follow-up office visit in 4 to 6 weeks or sooner if needed  S/p recent acute flare April to June 2021 improved with steroid taper and received 2 doses of Entyvio so far.  Appears to be in clinical remission currently  Continue Entyvio induction and maintenance therapy after Recheck CMP and CRP   The patient was provided an opportunity to ask questions and all were answered. The patient agreed with the plan and demonstrated an understanding of the instructions.  Damaris Hippo , MD    CC: [facility]

## 2021-04-10 LAB — GI PROFILE, STOOL, PCR

## 2021-04-10 LAB — QUANTIFERON-TB GOLD PLUS
Mitogen-NIL: 7.34 IU/mL
NIL: 0.04 IU/mL
QuantiFERON-TB Gold Plus: NEGATIVE
TB1-NIL: 0 IU/mL
TB2-NIL: <0 IU/mL

## 2021-04-11 ENCOUNTER — Telehealth: Payer: Self-pay | Admitting: Gastroenterology

## 2021-04-11 MED ORDER — DILTIAZEM GEL 2 %
1.0000 | Freq: Three times a day (TID) | CUTANEOUS | 1 refills | Status: DC
Start: 2021-04-11 — End: 2021-10-07

## 2021-04-11 NOTE — Telephone Encounter (Signed)
Inbound cal from patient requesting diltiazem gel be sent to pharmacy in chart please.

## 2021-04-11 NOTE — Telephone Encounter (Signed)
Medication Diltizem gel was sent to [facility], they do not make this medication Called pt to explain and sent into [facility]  Pts choice

## 2021-06-07 ENCOUNTER — Ambulatory Visit: Payer: Medicaid Other | Admitting: Gastroenterology

## 2021-07-12 ENCOUNTER — Ambulatory Visit: Payer: Medicaid Other | Admitting: Gastroenterology

## 2021-08-18 IMAGING — US US MFM OB FOLLOW-UP
1 series · 14 of 28 positions shown · non-contrast
Comparison: none

[Series 1: us mfm ob follow-up · 44 acquisitions, 14 frames shown]
[im 2/44]
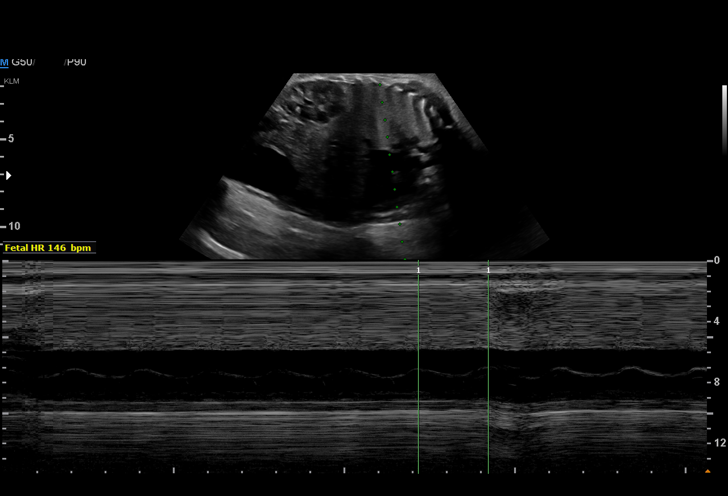
[im 5/44]
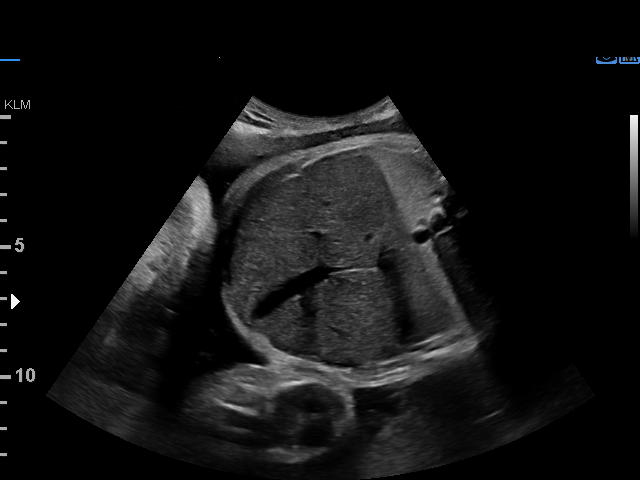
[im 8/44]
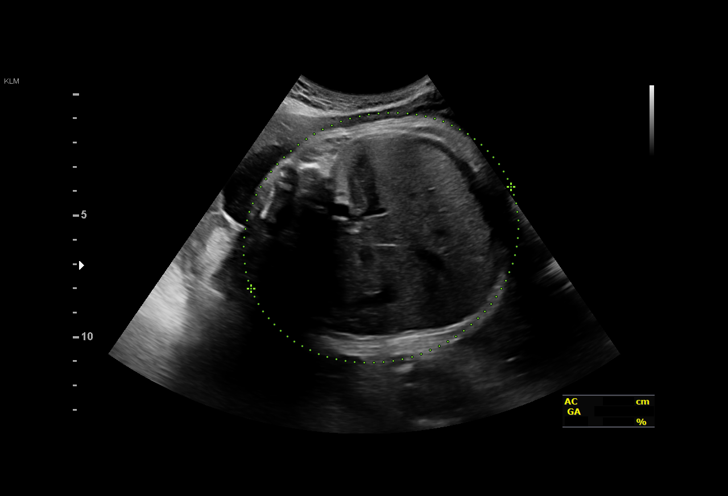
[im 12/44]
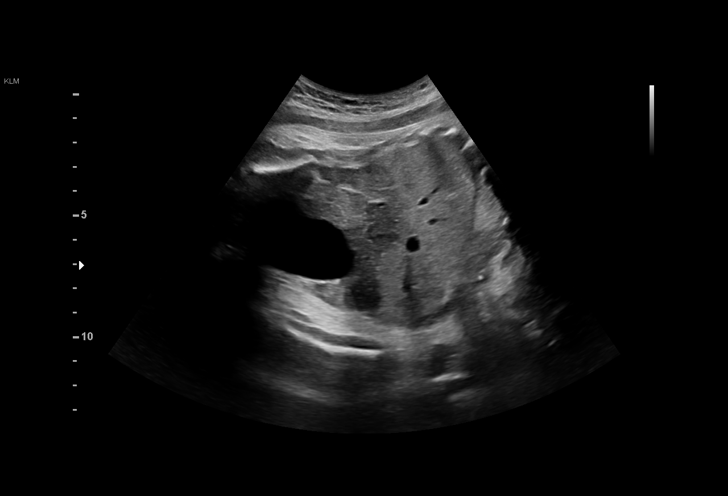
[im 15/44]
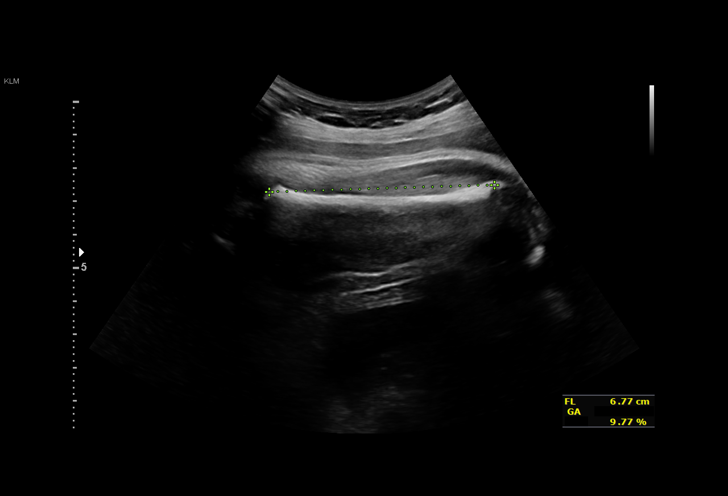
[im 18/44]
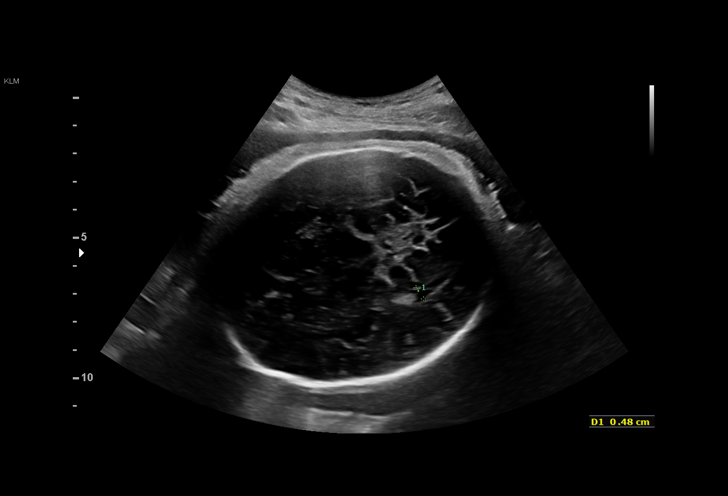
[im 21/44]
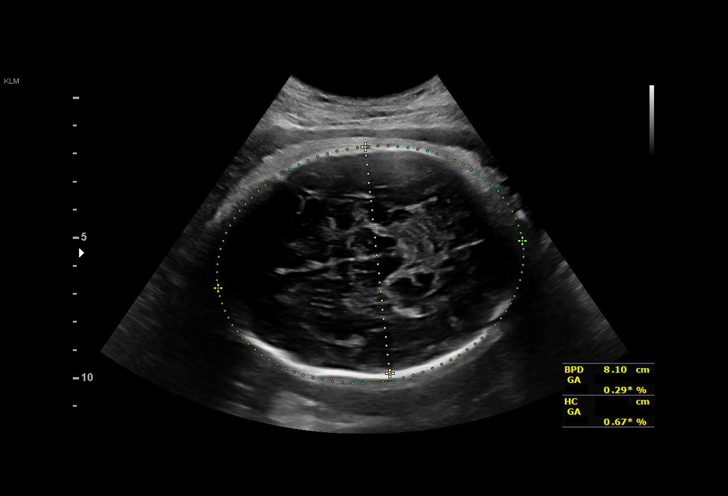
[im 24/44]
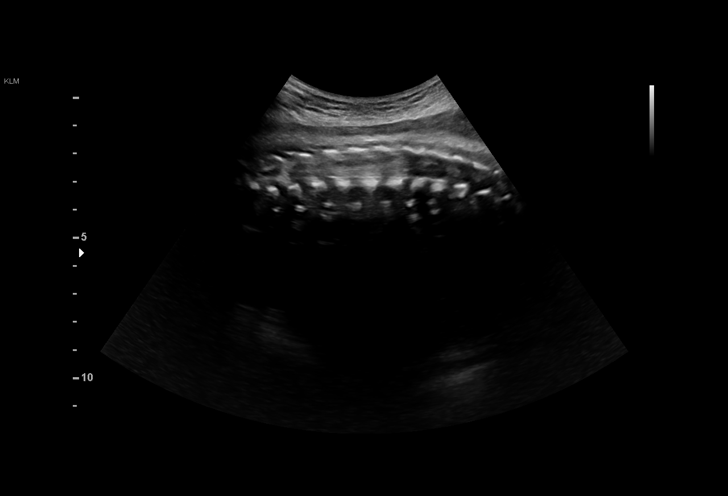
[im 28/44]
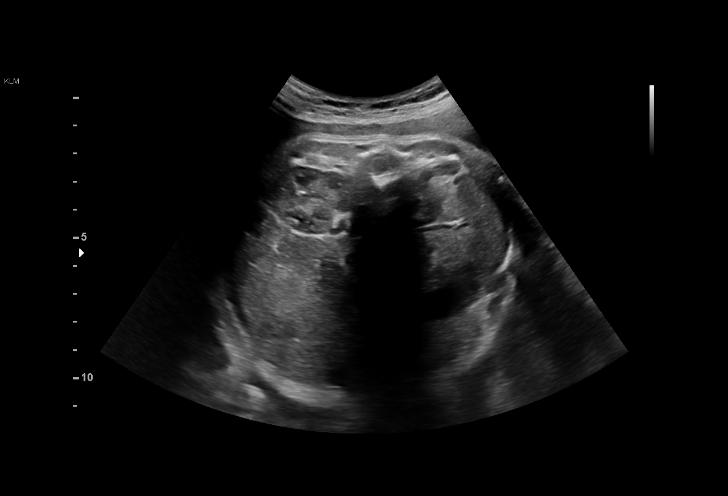
[im 31/44]
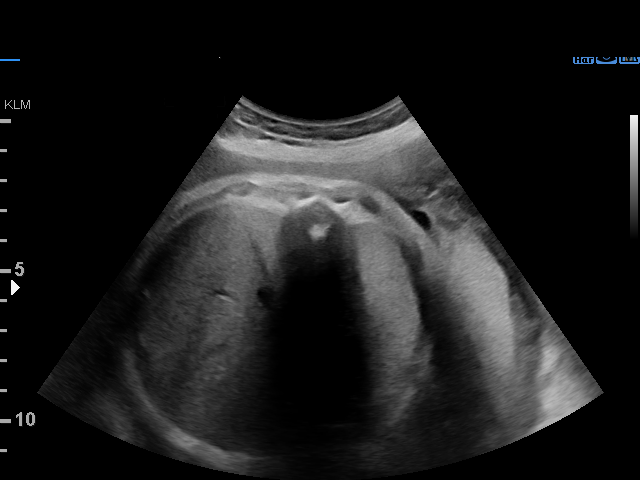
[im 34/44]
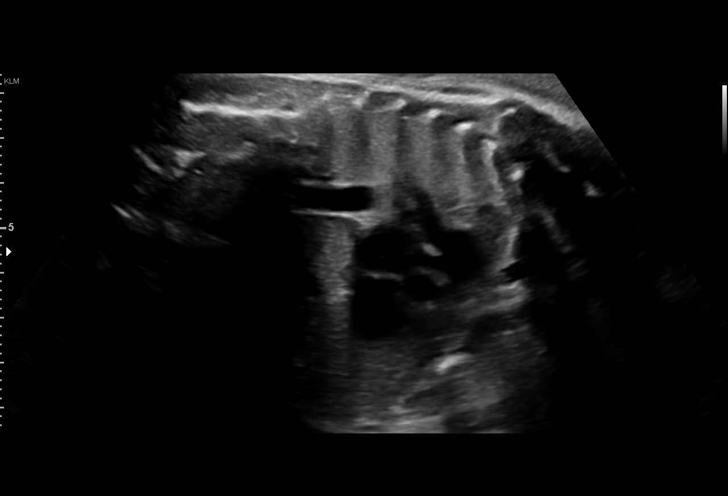
[im 37/44]
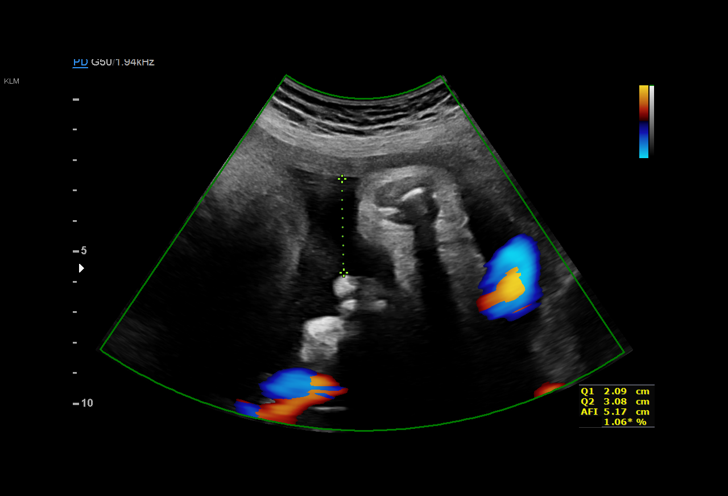
[im 40/44]
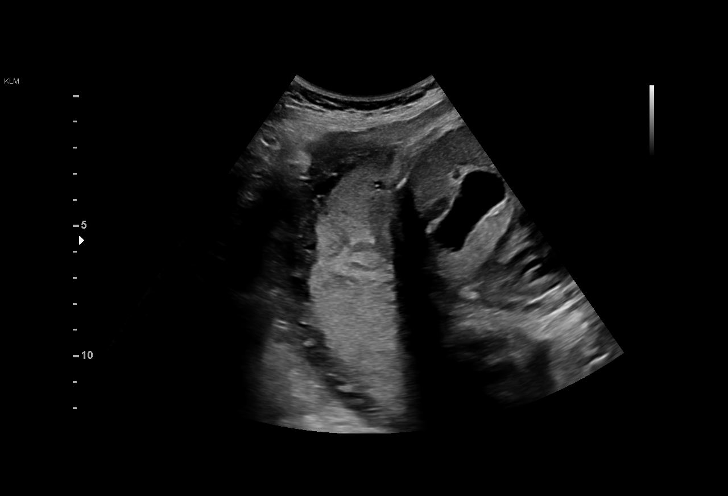
[im 44/44]
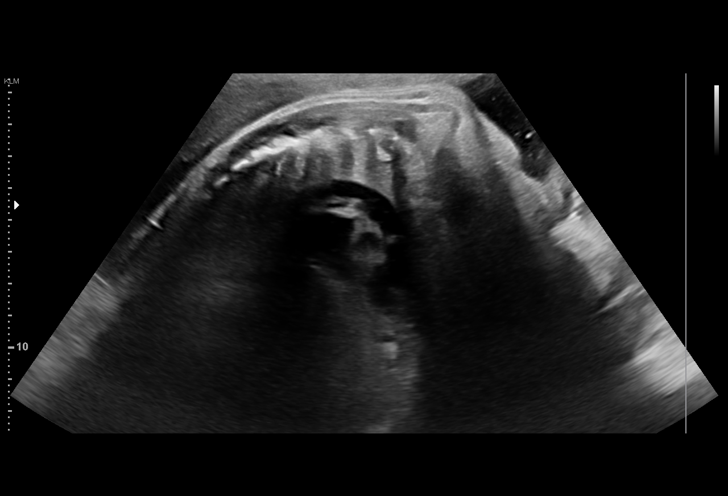

[14 of 28 positions shown; findings below may reference images not displayed]

----------------------------------------------------------------------

 ----------------------------------------------------------------------
Indications

  Medical complication of pregnancy
  (ulcerative colitis, hx thyrotoxicosis)
  Poor obstetric history: Previous fetal growth
  restriction (FGR) x 2
  36 weeks gestation of pregnancy
  Antenatal screening for malformations
  Previous cesarean delivery, antepartum x 2
  Late to prenatal care, third trimester
  Antenatal follow-up for nonvisualized fetal
  anatomy
 ----------------------------------------------------------------------
Vital Signs

                                                Height:        4'11"
Fetal Evaluation

 Num Of Fetuses:         1
 Fetal Heart Rate(bpm):  146
 Cardiac Activity:       Observed
 Presentation:           Cephalic
 Placenta:               Posterior
 P. Cord Insertion:      Previously Visualized

 Amniotic Fluid
 AFI FV:      Within normal limits

 AFI Sum(cm)     %Tile       Largest Pocket(cm)
 10.18           24

 RUQ(cm)       RLQ(cm)       LUQ(cm)        LLQ(cm)

Biometry

 BPD:      82.5  mm     G. Age:  33w 1d          1  %    CI:        70.98   %    70 - 86
                                                         FL/HC:      21.6   %    20.8 -
 HC:       312   mm     G. Age:  34w 6d          3  %    HC/AC:      0.96        0.92 -
 AC:      326.6  mm     G. Age:  36w 4d         63  %    FL/BPD:     81.7   %    71 - 87
 FL:       67.4  mm     G. Age:  34w 5d          8  %    FL/AC:      20.6   %    20 - 24

 Est. FW:    0420  gm    5 lb 15 oz      26  %
OB History

 Gravidity:    6         Term:   3         SAB:   1
 TOP:          1
Gestational Age

 LMP:           36w 4d        Date:  06/30/19                 EDD:   04/05/20
 U/S Today:     34w 6d                                        EDD:   04/17/20
 Best:          36w 4d     Det. By:  LMP  (06/30/19)          EDD:   04/05/20
Anatomy

 Cranium:               Appears normal         Aortic Arch:            Appears normal
 Cavum:                 Appears normal         Ductal Arch:            Appears normal
 Ventricles:            Appears normal         Diaphragm:              Appears normal
 Choroid Plexus:        Previously seen        Stomach:                Appears normal, left
                                                                       sided
 Cerebellum:            Previously seen        Abdomen:                Appears normal
 Posterior Fossa:       Previously seen        Abdominal Wall:         Previously seen
 Nuchal Fold:           Not applicable (>20    Cord Vessels:           Previously seen
                        wks GA)
 Face:                  Orbits and profile     Kidneys:                Appear normal
                        previously seen
 Lips:                  Previously seen        Bladder:                Appears normal
 Thoracic:              Appears normal         Spine:                  Appears normal
 Heart:                 Previously seen        Upper Extremities:      Previously seen
 RVOT:                  Previously seen        Lower Extremities:      Previously seen
 LVOT:                  Appears normal

 Other:  Hands and feet/heels previously visualized. Nasal bone previously
         visualized.
Cervix Uterus Adnexa

 Cervix
 Not visualized (advanced GA >66wks)
Comments

 This patient was seen for a follow up growth scan due to due
 to a history of ulcerative colitis and as she presented late for
 prenatal care.  She denies any problems since her last exam.
 She was informed that the fetal growth and amniotic fluid
 level appears appropriate for her gestational age.
 The patient reports that she already has a repeat C-section
 scheduled on March 29, 2020.  No further exams were
 scheduled in our office.

## 2021-09-23 ENCOUNTER — Ambulatory Visit: Payer: Medicaid Other | Admitting: Gastroenterology

## 2021-10-07 ENCOUNTER — Encounter: Payer: Self-pay | Admitting: Gastroenterology

## 2021-10-07 ENCOUNTER — Ambulatory Visit (INDEPENDENT_AMBULATORY_CARE_PROVIDER_SITE_OTHER): Payer: Medicaid Other | Admitting: Gastroenterology

## 2021-10-07 ENCOUNTER — Other Ambulatory Visit (INDEPENDENT_AMBULATORY_CARE_PROVIDER_SITE_OTHER): Payer: Medicaid Other

## 2021-10-07 VITALS — BP 112/64 | HR 70 | Ht 59.0 in | Wt 132.0 lb

## 2021-10-07 DIAGNOSIS — K5180 Other ulcerative colitis without complications: Secondary | ICD-10-CM

## 2021-10-07 DIAGNOSIS — R5382 Chronic fatigue, unspecified: Secondary | ICD-10-CM | POA: Diagnosis not present

## 2021-10-07 DIAGNOSIS — D500 Iron deficiency anemia secondary to blood loss (chronic): Secondary | ICD-10-CM | POA: Diagnosis not present

## 2021-10-07 DIAGNOSIS — K5100 Ulcerative (chronic) pancolitis without complications: Secondary | ICD-10-CM

## 2021-10-07 LAB — B12 AND FOLATE PANEL
Folate: 8.9 ng/mL (ref >5.9)
Vitamin B-12: 419 pg/mL (ref 211–911)

## 2021-10-07 LAB — COMPREHENSIVE METABOLIC PANEL
ALT: 10 U/L (ref 0–35)
AST: 11 U/L (ref 0–37)
Albumin: 4.1 g/dL (ref 3.5–5.2)
Alkaline Phosphatase: 57 U/L (ref 39–117)
BUN: 7 mg/dL (ref 6–23)
CO2: 21 mEq/L (ref 19–32)
Calcium: 8.9 mg/dL (ref 8.4–10.5)
Chloride: 107 mEq/L (ref 96–112)
Creatinine, Ser: 0.6 mg/dL (ref 0.40–1.20)
GFR: 115.91 mL/min (ref >60.00)
Glucose, Bld: 86 mg/dL (ref 70–99)
Potassium: 3.5 mEq/L (ref 3.5–5.1)
Sodium: 138 mEq/L (ref 135–145)
Total Bilirubin: 0.2 mg/dL (ref 0.2–1.2)
Total Protein: 7 g/dL (ref 6.0–8.3)

## 2021-10-07 LAB — CBC WITH DIFFERENTIAL/PLATELET
Basophils Absolute: 0.1 10*3/uL (ref 0.0–0.1)
Basophils Relative: 0.5 % (ref 0.0–3.0)
Eosinophils Absolute: 0.3 10*3/uL (ref 0.0–0.7)
Eosinophils Relative: 3.3 % (ref 0.0–5.0)
HCT: 41.5 % (ref 36.0–46.0)
Hemoglobin: 14.1 g/dL (ref 12.0–15.0)
Lymphocytes Relative: 51.5 % — ABNORMAL HIGH (ref 12.0–46.0)
Lymphs Abs: 5.4 10*3/uL — ABNORMAL HIGH (ref 0.7–4.0)
MCHC: 34 g/dL (ref 30.0–36.0)
MCV: 91.9 fl (ref 78.0–100.0)
Monocytes Absolute: 0.9 10*3/uL (ref 0.1–1.0)
Monocytes Relative: 8.3 % (ref 3.0–12.0)
Neutro Abs: 3.8 10*3/uL (ref 1.4–7.7)
Neutrophils Relative %: 36.4 % — ABNORMAL LOW (ref 43.0–77.0)
Platelets: 283 10*3/uL (ref 150.0–400.0)
RBC: 4.51 Mil/uL (ref 3.87–5.11)
RDW: 14.8 % (ref 11.5–15.5)
WBC: 10.4 10*3/uL (ref 4.0–10.5)

## 2021-10-07 LAB — IBC + FERRITIN
Ferritin: 10.8 ng/mL (ref 10.0–291.0)
Iron: 56 ug/dL (ref 42–145)
Saturation Ratios: 14.5 % — ABNORMAL LOW (ref 20.0–50.0)
TIBC: 386.4 ug/dL (ref 250.0–450.0)
Transferrin: 276 mg/dL (ref 212.0–360.0)

## 2021-10-07 LAB — SEDIMENTATION RATE: Sed Rate: 28 mm/hr — ABNORMAL HIGH (ref 0–20)

## 2021-10-07 LAB — HIGH SENSITIVITY CRP: CRP, High Sensitivity: 0.55 mg/L (ref 0.000–5.000)

## 2021-10-07 MED ORDER — NA SULFATE-K SULFATE-MG SULF 17.5-3.13-1.6 GM/177ML PO SOLN: ORAL | 0 refills | Status: DC

## 2021-10-07 NOTE — Progress Notes (Addendum)
Joyce Solomon    [id number]    Nov 18, 1986  Primary Care Physician:[facility]  Referring Physician: Honaker, Welch [address] [place],   [address]   Chief complaint:  Ulcerative colitis  HPI:  35 year old very pleasant female here for follow up visit for ulcerative colitis  She feels tired/fatigued otherwise has no significant GI complaints  She is having on average 2-3 bowel movements per day with no bleeding.  Denies any nausea, vomiting, abdominal pain, melena or bright red blood per rectum   She is on Entyvio infusions  Last major ulcerative colitis flare was during her pregnancy prior to initiation of Entyvio. She had a minor flare of symptoms in May 2022 that was treated with Prednisone taper     Relevant GI history: She was in clinical remission on Humira, self discontinued after she became pregnant, restarted in Nov 2020 and she stopped it again after 3 months as she thought it was not helping   Colonoscopy Apr 10, 2018 negative for active colitis, chronic mucosal changes with altered vascularity and mild erythema.  Random biopsies negative for active inflammation.   Drug trough 5.9 with undetectable antibody for Humira     Colonoscopy June 2015 showed moderately severe left-sided colitis   Allergic reaction to Remicade in 2016.  Elevated antibody level to infliximab 1622 and undetectable drug level   Flexible sigmoidoscopy January 2017 showed severe colitis left colon with ulceration, biopsies showed moderately active colitis.   Started on Humira March 2017, and clinical remission since then   Outpatient Encounter Medications as of 10/07/2021  Medication Sig   vedolizumab (ENTYVIO) 300 MG injection Inject 300 mg into the vein every 8 (eight) weeks.   [DISCONTINUED] diltiazem 2 % GEL Apply 1 application topically 3 (three) times daily. Use pea sized amount per rectum for 6-8 weeks   [DISCONTINUED] hydrOXYzine  (ATARAX/VISTARIL) 25 MG tablet Take 1 tablet (25 mg total) by mouth every 8 (eight) hours as needed for anxiety.   No facility-administered encounter medications on file as of 10/07/2021.    Allergies as of 10/07/2021 - Review Complete 10/07/2021  Allergen Reaction Noted   Latex Rash 05/20/2009   Remicade [infliximab] Itching 08/07/2015    Past Medical History:  Diagnosis Date   Allergy    SEASONAL   Blood transfusion without reported diagnosis    Headache    Iron Deficiency Anemia 2008   s/p transfusions 2009, 2010; inconsistent compliance with PO iron.    Non compliance w medication regimen 06/2015   stopped Remicade, may not have been taking Azulfidine.   Thyrotoxicosis    Ulcerative Colitis 2007   on Humira    Past Surgical History:  Procedure Laterality Date   CESAREAN SECTION N/A 12/09/2013   Procedure: Primary Cesarean Section Delivery Baby Girl @ 1959, Apgars 8/8;  Surgeon: Logan Bores, MD;  Location: [facility];  Service: Obstetrics;  Laterality: N/A;   CESAREAN SECTION N/A 08/14/2017   Procedure: CESAREAN SECTION;  Surgeon: Paula Compton, MD;  Location: [facility];  Service: Obstetrics;  Laterality: N/A;   CESAREAN SECTION N/A 03/23/2020   Procedure: CESAREAN SECTION;  Surgeon: Osborne Oman, MD;  Location: [facility];  Service: Obstetrics;  Laterality: N/A;   COLONOSCOPY  01/19/2011   Dr Penelope Coop. no active UC   COLONOSCOPY W/ BIOPSIES N/A 04/2014   Dr Deatra Ina: Left sided colitis from prox descending to rectum; no inflammation at or proximal  to splenic flexure   DILATION AND EVACUATION  08/21/2012   Procedure: DILATATION AND EVACUATION;  Surgeon: Logan Bores, MD;  Location: [facility];  Service: Gynecology   TONSILLECTOMY      Family History  Problem Relation Age of Onset   67 / Stillbirths Other    Diabetes Sister    Hypotension Neg Hx    Alcohol abuse Neg Hx    Arthritis Neg Hx    Asthma Neg Hx    Birth defects Neg Hx    COPD Neg  Hx    Depression Neg Hx    Drug abuse Neg Hx    Hearing loss Neg Hx    Heart disease Neg Hx    Hyperlipidemia Neg Hx    Hypertension Neg Hx    Kidney disease Neg Hx    Learning disabilities Neg Hx    Mental illness Neg Hx    Mental retardation Neg Hx    Colon cancer Neg Hx    Colon polyps Neg Hx    Esophageal cancer Neg Hx    Stomach cancer Neg Hx    Rectal cancer Neg Hx     Social History   Socioeconomic History   Marital status: Single    Spouse name: Not on file   Number of children: 4   Years of education: Not on file   Highest education level: Not on file  Occupational History   Occupation: unemployed  Tobacco Use   Smoking status: Former    Years: 3.00    Types: Cigarettes    Quit date: 12/13/2018    Years since quitting: 2.8   Smokeless tobacco: Never   Tobacco comments:    smokes a cig "every blue moon"  Vaping Use   Vaping Use: Never used  Substance and Sexual Activity   Alcohol use: Not Currently    Alcohol/week: 0.0 standard drinks    Comment: occassional   Drug use: No   Sexual activity: Yes    Birth control/protection: None  Other Topics Concern   Not on file  Social History Narrative   Not on file   Social Determinants of Health   Financial Resource Strain: Not on file  Food Insecurity: Not on file  Transportation Needs: Not on file  Physical Activity: Not on file  Stress: Not on file  Social Connections: Not on file  Intimate Partner Violence: Not on file      Review of systems: All other review of systems negative except as mentioned in the HPI.   Physical Exam: Vitals:   10/07/21 0829  BP: 112/64  Pulse: 70   Body mass index is 26.66 kg/m. Gen:      No acute distress HEENT:  sclera anicteric Abd:      soft, non-tender; no palpable masses, no distension Ext:    No edema Neuro: alert and oriented x 3 Psych: normal mood and affect  Data Reviewed:  Reviewed labs, radiology imaging, old records and pertinent past GI work  up   Assessment and Plan/Recommendations:  35 year old female with history of  ulcerative colitis pan colitis, iron deficiency anemia  Overall symptoms are stable on Entyvio infusion, continue current regimen  IBD health maintenance Check CBC, CMP, CRP, ESR, B12, folate, iron panel and Vit D She is due for TB QuantiFERON gold   She is due for surveillance colonoscopy, last colonsocopy in 2019. We will schedule it today The risks and benefits as well as alternatives of endoscopic procedure(s) have been  discussed and reviewed. All questions answered. The patient agrees to proceed.  The patient was provided an opportunity to ask questions and all were answered. The patient agreed with the plan and demonstrated an understanding of the instructions.  Damaris Hippo , MD    CC: [facility]

## 2021-10-07 NOTE — Patient Instructions (Signed)
Your provider has requested that you go to the basement level for lab work before leaving today. Press "B" on the elevator. The lab is located at the first door on the left as you exit the elevator.   You have been scheduled for a colonoscopy. Please follow written instructions given to you at your visit today.  Please pick up your prep supplies at the pharmacy within the next 1-3 days. If you use inhalers (even only as needed), please bring them with you on the day of your procedure.   Due to recent changes in healthcare laws, you may see the results of your imaging and laboratory studies on MyChart before your provider has had a chance to review them.  We understand that in some cases there may be results that are confusing or concerning to you. Not all laboratory results come back in the same time frame and the provider may be waiting for multiple results in order to interpret others.  Please give [place] 48 hours in order for your provider to thoroughly review all the results before contacting the office for clarification of your results.    If you are age 59 or older, your body mass index should be between 23-30. Your Body mass index is 26.66 kg/m. If this is out of the aforementioned range listed, please consider follow up with your Primary Care Provider.  If you are age 20 or younger, your body mass index should be between 19-25. Your Body mass index is 26.66 kg/m. If this is out of the aformentioned range listed, please consider follow up with your Primary Care Provider.   ________________________________________________________  The [facility] providers would like to encourage you to use [facility] to communicate with providers for non-urgent requests or questions.  Due to long hold times on the telephone, sending your provider a message by [facility] may be a faster and more efficient way to get a response.  Please allow 48 business hours for a response.  Please remember that this is for non-urgent  requests.  _______________________________________________________   I appreciate the  opportunity to care for you  Thank You   Harl Bowie , MD

## 2021-10-07 NOTE — Addendum Note (Signed)
Addended by: Oda Kilts on: 10/07/2021 09:18 AM   Modules accepted: Orders

## 2021-10-10 LAB — QUANTIFERON-TB GOLD PLUS
Mitogen-NIL: >10 IU/mL
NIL: 0.06 IU/mL
QuantiFERON-TB Gold Plus: NEGATIVE
TB1-NIL: 0.01 IU/mL
TB2-NIL: 0.01 IU/mL

## 2021-11-22 ENCOUNTER — Telehealth: Payer: Self-pay

## 2021-11-22 NOTE — Telephone Encounter (Signed)
Hi Dr. Silverio Decamp,  I called this pt to remind her of her appt. She stated she could not find a care partner for the procedure. She cancelled and was rescheduled to 2/6.

## 2021-11-23 ENCOUNTER — Encounter: Payer: Medicaid Other | Admitting: Gastroenterology

## 2021-11-24 NOTE — Telephone Encounter (Signed)
Got it.

## 2021-12-15 ENCOUNTER — Ambulatory Visit (AMBULATORY_SURGERY_CENTER): Payer: Medicaid Other

## 2021-12-15 ENCOUNTER — Other Ambulatory Visit: Payer: Self-pay

## 2021-12-15 VITALS — Ht 59.0 in | Wt 130.0 lb

## 2021-12-15 DIAGNOSIS — K5100 Ulcerative (chronic) pancolitis without complications: Secondary | ICD-10-CM

## 2021-12-15 MED ORDER — NA SULFATE-K SULFATE-MG SULF 17.5-3.13-1.6 GM/177ML PO SOLN: 1.0000 | Freq: Once | ORAL | 0 refills | Status: AC

## 2021-12-15 NOTE — Progress Notes (Signed)
No egg or soy allergy known to patient  No issues known to pt with past sedation with any surgeries or procedures Patient denies ever being told they had issues or difficulty with intubation  No FH of Malignant Hyperthermia Pt is not on diet pills Pt is not on home 02  Pt is not on blood thinners  Pt denies issues with constipation;  No A fib or A flutter NO PA's for preps discussed with pt in PV today  Discussed with pt there will be an out-of-pocket cost for prep and that varies from $0 to 70 + dollars - pt verbalized understanding  Due to the COVID-19 pandemic we are asking patients to follow certain guidelines in PV and the [employer]   Pt aware of COVID protocols and [facility] guidelines  PV completed over the phone. Pt verified name, DOB, address and insurance during PV today.  Pt mailed instruction packet with copy of consent form to read and not return, and instructions.  Pt encouraged to call with questions or issues.  If pt has My chart, procedure instructions sent via My Chart    Patient reports she does not have a ride for this procedure and is willing to complete the procedure WITHOUT anesthesia- patient was sent the [employer] and [employer] information to obtain a care partner for this procedure- patient reports she will try to get "someone to get off of work to be here with me";  Patient advised care partner is required for her to have the procedure-Patient verbalized understanding of information/instructions;  Patient advised to call back to the office at [phone] should questions/concerns arise;

## 2021-12-26 ENCOUNTER — Encounter: Payer: Self-pay | Admitting: Gastroenterology

## 2021-12-26 ENCOUNTER — Ambulatory Visit (AMBULATORY_SURGERY_CENTER): Payer: Medicaid Other | Admitting: Gastroenterology

## 2021-12-26 VITALS — BP 99/55 | HR 75 | Temp 98.6°F | Resp 14 | Ht 59.0 in | Wt 130.0 lb

## 2021-12-26 DIAGNOSIS — K5100 Ulcerative (chronic) pancolitis without complications: Secondary | ICD-10-CM

## 2021-12-26 DIAGNOSIS — K6289 Other specified diseases of anus and rectum: Secondary | ICD-10-CM

## 2021-12-26 MED ORDER — SODIUM CHLORIDE 0.9 % IV SOLN: 500.0000 mL | INTRAVENOUS | Status: DC

## 2021-12-26 MED ORDER — MESALAMINE 1000 MG RE SUPP: 1000.0000 mg | Freq: Every day | RECTAL | 0 refills | Status: DC

## 2021-12-26 NOTE — Op Note (Signed)
[facility] Patient Name: Joyce Solomon Procedure Date: 12/26/2021 3:39 PM MRN: [id number] Endoscopist: Mauri Pole , MD Age: 36 Referring MD:  Date of Birth: [birth year] Gender: Female Account #: [id number] Procedure:                Colonoscopy Indications:              High risk colon cancer surveillance: Ulcerative                            pancolitis of 8 (or more) years duration Medicines:                Monitored Anesthesia Care Procedure:                Pre-Anesthesia Assessment:                           - Prior to the procedure, a History and Physical                            was performed, and patient medications and                            allergies were reviewed. The patient's tolerance of                            previous anesthesia was also reviewed. The risks                            and benefits of the procedure and the sedation                            options and risks were discussed with the patient.                            All questions were answered, and informed consent                            was obtained. Prior Anticoagulants: The patient has                            taken no previous anticoagulant or antiplatelet                            agents. ASA Grade Assessment: II - A patient with                            mild systemic disease. After reviewing the risks                            and benefits, the patient was deemed in                            satisfactory condition to undergo the procedure.  After obtaining informed consent, the colonoscope                            was passed under direct vision. Throughout the                            procedure, the patient's blood pressure, pulse, and                            oxygen saturations were monitored continuously. The                            Olympus PCF-H190DL (#[id number]) Colonoscope was                            introduced  through the anus and advanced to the the                            terminal ileum, with identification of the                            appendiceal orifice and IC valve. The colonoscopy                            was performed without difficulty. The patient                            tolerated the procedure well. The quality of the                            bowel preparation was good. The ileocecal valve,                            appendiceal orifice, and rectum were photographed. Scope In: 3:45:07 PM Scope Out: 4:02:28 PM Scope Withdrawal Time: 0 hours 7 minutes 17 seconds  Total Procedure Duration: 0 hours 17 minutes 21 seconds  Findings:                 The perianal and digital rectal examinations were                            normal.                           A continuous area of bleeding ulcerated mucosa with                            stigmata of recent bleeding was present in the                            distal rectum. Biopsies were taken with a cold                            forceps for histology.  The mucosa vascular pattern in the sigmoid colon,                            in the descending colon, in the transverse colon,                            in the ascending colon and in the cecum was                            diffusely decreased consistent with inactive                            colitis.                           Non-bleeding internal hemorrhoids were found during                            retroflexion. The hemorrhoids were small. Complications:            No immediate complications. Estimated Blood Loss:     Estimated blood loss was minimal. Impression:               - Mucosal ulceration in distal rectum. Biopsied.                           - Decreased mucosa vascular pattern in the sigmoid                            colon, in the descending colon, in the transverse                            colon, in the ascending colon and in the  cecum.                           - Non-bleeding internal hemorrhoids. Recommendation:           - Patient has a contact number available for                            emergencies. The signs and symptoms of potential                            delayed complications were discussed with the                            patient. Return to normal activities tomorrow.                            Written discharge instructions were provided to the                            patient.                           - Resume previous diet.                           -  Continue present medications.                           - Await pathology results.                           - Use Canasa 1000 mg suppository 1 per rectum QHS                            for 2 weeks.                           - Return to GI office, next available appointment                            in 3-4 weeks with APP or Dr Silverio Decamp. Mauri Pole, MD 12/26/2021 4:08:01 PM This report has been signed electronically.

## 2021-12-26 NOTE — Progress Notes (Signed)
Sedate, gd SR, tolerated procedure well, VSS, report to RN 

## 2021-12-26 NOTE — Patient Instructions (Addendum)
Handouts were given to your care partner on ulcerative colitis and hemorrhoids. A prescription for CANASA Suppository was sent to your pharmacy to be picked up.  Use one suppository per rectum every night before bed for 2 wekks. You may resume your current medications today. Await biopsy results.  May take 1-3 weeks to receive pathology results. The office will call to schedule an appointment for 3-4 weeks with the nurse practioner or Dr. Silverio Decamp. Please call if any questions or concerns.      YOU HAD AN ENDOSCOPIC PROCEDURE TODAY AT [facility]:   Refer to the procedure report that was given to you for any specific questions about what was found during the examination.  If the procedure report does not answer your questions, please call your gastroenterologist to clarify.  If you requested that your care partner not be given the details of your procedure findings, then the procedure report has been included in a sealed envelope for you to review at your convenience later.  YOU SHOULD EXPECT: Some feelings of bloating in the abdomen. Passage of more gas than usual.  Walking can help get rid of the air that was put into your GI tract during the procedure and reduce the bloating. If you had a lower endoscopy (such as a colonoscopy or flexible sigmoidoscopy) you may notice spotting of blood in your stool or on the toilet paper. If you underwent a bowel prep for your procedure, you may not have a normal bowel movement for a few days.  Please Note:  You might notice some irritation and congestion in your nose or some drainage.  This is from the oxygen used during your procedure.  There is no need for concern and it should clear up in a day or so.  SYMPTOMS TO REPORT IMMEDIATELY:  Following lower endoscopy (colonoscopy or flexible sigmoidoscopy):  Excessive amounts of blood in the stool  Significant tenderness or worsening of abdominal pains  Swelling of the abdomen that is new,  acute  Fever of 100F or higher    For urgent or emergent issues, a gastroenterologist can be reached at any hour by calling [phone]. Do not use MyChart messaging for urgent concerns.    DIET:  We do recommend a small meal at first, but then you may proceed to your regular diet.  Drink plenty of fluids but you should avoid alcoholic beverages for 24 hours.  ACTIVITY:  You should plan to take it easy for the rest of today and you should NOT DRIVE or use heavy machinery until tomorrow (because of the sedation medicines used during the test).    FOLLOW UP: Our staff will call the number listed on your records 48-72 hours following your procedure to check on you and address any questions or concerns that you may have regarding the information given to you following your procedure. If we do not reach you, we will leave a message.  We will attempt to reach you two times.  During this call, we will ask if you have developed any symptoms of COVID 19. If you develop any symptoms (ie: fever, flu-like symptoms, shortness of breath, cough etc.) before then, please call [phone].  If you test positive for Covid 19 in the 2 weeks post procedure, please call and report this information to [place].    If any biopsies were taken you will be contacted by phone or by letter within the next 1-3 weeks.  Please call us at [phone] if  you have not heard about the biopsies in 3 weeks.    SIGNATURES/CONFIDENTIALITY: You and/or your care partner have signed paperwork which will be entered into your electronic medical record.  These signatures attest to the fact that that the information above on your After Visit Summary has been reviewed and is understood.  Full responsibility of the confidentiality of this discharge information lies with you and/or your care-partner.

## 2021-12-26 NOTE — Progress Notes (Signed)
[facility] History and Physical   Primary Care Physician:  [place]   Reason for Procedure:  Ulcerative pan colitis  Plan:    Surveillance colonoscopy with possible interventions as needed     HPI: Joyce Solomon is a very pleasant 36 y.o. female here for colonoscopy for ulcerative pan colitis to monitor disease activity and for surveillance.   The risks and benefits as well as alternatives of endoscopic procedure(s) have been discussed and reviewed. All questions answered. The patient agrees to proceed.    Past Medical History:  Diagnosis Date   Anxiety    Blood transfusion without reported diagnosis    Headache    Iron Deficiency Anemia 2008   s/p transfusions 2009, 2010; inconsistent compliance with PO iron.    Non compliance w medication regimen 06/2015   stopped Remicade, may not have been taking Azulfidine.   Seasonal allergies    Thyrotoxicosis    Ulcerative Colitis 2007   on Humira    Past Surgical History:  Procedure Laterality Date   CESAREAN SECTION N/A 12/09/2013   Procedure: Primary Cesarean Section Delivery Baby Girl @ 1959, [place] 8/8;  Surgeon: Logan Bores, MD;  Location: [facility];  Service: Obstetrics;  Laterality: N/A;   CESAREAN SECTION N/A 08/14/2017   Procedure: CESAREAN SECTION;  Surgeon: Paula Compton, MD;  Location: [facility];  Service: Obstetrics;  Laterality: N/A;   CESAREAN SECTION N/A 03/23/2020   Procedure: CESAREAN SECTION;  Surgeon: Osborne Oman, MD;  Location: [facility];  Service: Obstetrics;  Laterality: N/A;   COLONOSCOPY  01/19/2011   Dr Penelope Coop. no active UC   COLONOSCOPY  2019   KN-prep exc-   COLONOSCOPY W/ BIOPSIES N/A 04/2014   Dr Deatra Ina: Left sided colitis from prox descending to rectum; no inflammation at or proximal to splenic flexure   DILATION AND EVACUATION  08/21/2012   Procedure: DILATATION AND EVACUATION;  Surgeon: Logan Bores, MD;  Location: [facility];  Service:  Gynecology   TONSILLECTOMY      Prior to Admission medications   Medication Sig Start Date End Date Taking? Authorizing Provider  vedolizumab (ENTYVIO) 300 MG injection Inject 300 mg into the vein every 8 (eight) weeks. 08/26/20   Mauri Pole, MD  zolpidem (AMBIEN) 10 MG tablet Take 10 mg by mouth at bedtime as needed. 11/21/21   [provider]    Current Outpatient Medications  Medication Sig Dispense Refill   vedolizumab (ENTYVIO) 300 MG injection Inject 300 mg into the vein every 8 (eight) weeks. 1 each 6   zolpidem (AMBIEN) 10 MG tablet Take 10 mg by mouth at bedtime as needed.     No current facility-administered medications for this visit.    Allergies as of 12/26/2021 - Review Complete 12/15/2021  Allergen Reaction Noted   Latex Rash 05/20/2009   Remicade [infliximab] Itching 08/07/2015    Family History  Problem Relation Age of Onset   Miscarriages / Stillbirths Other    Diabetes Sister    Hypotension Neg Hx    Alcohol abuse Neg Hx    Arthritis Neg Hx    Asthma Neg Hx    Birth defects Neg Hx    COPD Neg Hx    Depression Neg Hx    Drug abuse Neg Hx    Hearing loss Neg Hx    Heart disease Neg Hx    Hyperlipidemia Neg Hx    Hypertension Neg Hx    Kidney disease Neg  Hx    Learning disabilities Neg Hx    Mental illness Neg Hx    Mental retardation Neg Hx    Colon cancer Neg Hx    Colon polyps Neg Hx    Esophageal cancer Neg Hx    Stomach cancer Neg Hx    Rectal cancer Neg Hx     Social History   Socioeconomic History   Marital status: Single    Spouse name: Not on file   Number of children: 4   Years of education: Not on file   Highest education level: Not on file  Occupational History   Occupation: unemployed  Tobacco Use   Smoking status: Former    Years: 3.00    Types: Cigarettes    Quit date: 12/13/2018    Years since quitting: 3.0   Smokeless tobacco: Never   Tobacco comments:    smokes a cig "every blue moon"  Vaping Use    Vaping Use: Never used  Substance and Sexual Activity   Alcohol use: Not Currently    Comment: special occasions   Drug use: No   Sexual activity: Yes    Birth control/protection: None  Other Topics Concern   Not on file  Social History Narrative   Not on file   Social Determinants of Health   Financial Resource Strain: Not on file  Food Insecurity: Not on file  Transportation Needs: Not on file  Physical Activity: Not on file  Stress: Not on file  Social Connections: Not on file  Intimate Partner Violence: Not on file    Review of Systems:  All other review of systems negative except as mentioned in the HPI.  Physical Exam: Vital signs in last 24 hours: BP 105/65    Pulse 87    Temp 98.6 F (37 C) (Temporal)    Ht 4' 11"  (1.499 m)    Wt 130 lb (59 kg)    LMP 11/14/2021 (Approximate)    SpO2 100%    BMI 26.26 kg/m  General:   Alert, NAD Lungs:  Clear .   Heart:  Regular rate and rhythm Abdomen:  Soft, nontender and nondistended. Neuro/Psych:  Alert and cooperative. Normal mood and affect. A and O x 3  Reviewed labs, radiology imaging, old records and pertinent past GI work up  Patient is appropriate for planned procedure(s) and anesthesia in an ambulatory setting   K. Denzil Magnuson , MD [phone]

## 2021-12-26 NOTE — Progress Notes (Signed)
No problems noted in the recovery room. maw 

## 2021-12-26 NOTE — Progress Notes (Signed)
Called to room to assist during endoscopic procedure.  Patient ID and intended procedure confirmed with present staff. Received instructions for my participation in the procedure from the performing physician.  

## 2021-12-28 ENCOUNTER — Telehealth: Payer: Self-pay | Admitting: *Deleted

## 2021-12-28 ENCOUNTER — Telehealth: Payer: Self-pay

## 2021-12-28 NOTE — Telephone Encounter (Signed)
No answer, left message to call back later today, B.Tawan Corkern RN. 

## 2021-12-28 NOTE — Telephone Encounter (Signed)
°  Follow up Call-  Call back number 12/26/2021  Post procedure Call Back phone  # [phone]  Permission to leave phone message Yes  Some recent data might be hidden     Patient questions:  Do you have a fever, pain , or abdominal swelling? No. Pain Score  0 *  Have you tolerated food without any problems? Yes.    Have you been able to return to your normal activities? Yes.    Do you have any questions about your discharge instructions: Diet   No. Medications  No. Follow up visit  No.  Do you have questions or concerns about your Care? No.  Actions: * If pain score is 4 or above: No action needed, pain <4.  Have you developed a fever since your procedure? no  2.   Have you had an respiratory symptoms (SOB or cough) since your procedure? no  3.   Have you tested positive for COVID 19 since your procedure no  4.   Have you had any family members/close contacts diagnosed with the COVID 19 since your procedure?  no   If yes to any of these questions please route to Joylene John, RN and Joella Prince, RN

## 2022-01-05 ENCOUNTER — Ambulatory Visit
Admission: EM | Admit: 2022-01-05 | Discharge: 2022-01-05 | Disposition: A | Discharge status: Home / Self Care | Payer: Medicaid Other | Attending: Emergency Medicine | Admitting: Emergency Medicine

## 2022-01-05 ENCOUNTER — Other Ambulatory Visit: Payer: Self-pay

## 2022-01-05 ENCOUNTER — Encounter: Payer: Self-pay | Admitting: Emergency Medicine

## 2022-01-05 DIAGNOSIS — R197 Diarrhea, unspecified: Secondary | ICD-10-CM | POA: Diagnosis not present

## 2022-01-05 DIAGNOSIS — R112 Nausea with vomiting, unspecified: Secondary | ICD-10-CM

## 2022-01-05 DIAGNOSIS — R1084 Generalized abdominal pain: Secondary | ICD-10-CM

## 2022-01-05 LAB — POCT URINALYSIS DIP (MANUAL ENTRY)
Blood, UA: NEGATIVE
Glucose, UA: NEGATIVE mg/dL
Leukocytes, UA: NEGATIVE
Nitrite, UA: NEGATIVE
Spec Grav, UA: >=1.03 — AB (ref 1.010–1.025)
Urobilinogen, UA: 0.2 E.U./dL
pH, UA: 6 (ref 5.0–8.0)

## 2022-01-05 LAB — POCT URINE PREGNANCY: Preg Test, Ur: NEGATIVE

## 2022-01-05 MED ORDER — ONDANSETRON 4 MG PO TBDP: 4.0000 mg | ORAL_TABLET | Freq: Three times a day (TID) | ORAL | 0 refills | Status: DC | PRN

## 2022-01-05 MED ORDER — ONDANSETRON 4 MG PO TBDP
4.0000 mg | ORAL_TABLET | Freq: Once | ORAL | Status: AC
  Administered 2022-01-05: 4 mg via ORAL

## 2022-01-05 NOTE — ED Provider Notes (Signed)
[facility]    CSN: [id number] Arrival date & time: 01/05/22  1913      History   Chief Complaint Chief Complaint  Patient presents with   Abdominal Pain   Nausea   Emesis    HPI Joyce Solomon is a 36 y.o. female.  Patient presents with nausea, vomiting, diarrhea, generalized abdominal discomfort today.  She reports 4-5 episodes of emesis today and 3-4 episodes of diarrhea.  Her son has similar symptoms.  She denies fever, chills, blood in her stool, dysuria, hematuria, or other symptoms.  No treatments at home.  Her medical history includes ulcerative colitis and she is followed by gastroenterology.  The history is provided by the patient and medical records.   Past Medical History:  Diagnosis Date   Anxiety    Blood transfusion without reported diagnosis    Headache    Iron Deficiency Anemia 2008   s/p transfusions 2009, 2010; inconsistent compliance with PO iron.    Non compliance w medication regimen 06/2015   stopped Remicade, may not have been taking Azulfidine.   Seasonal allergies    Thyrotoxicosis    Ulcerative Colitis 2007   on Humira    Patient Active Problem List   Diagnosis Date Noted   Subclinical hyperthyroidism 08/12/2020   C. difficile diarrhea 03/24/2020   History of 2 cesarean sections 03/24/2020   Postpartum care following cesarean delivery 03/24/2020   Group B Streptococcus carrier, +RV culture, currently pregnant 03/14/2020   Supervision of high risk pregnancy, antepartum 09/09/2019   Anal fissure 09/11/2018   Rectal bleeding 04/23/2014   History of cesarean delivery affecting pregnancy 12/10/2013   Prior pregnancy complicated by SGA (small for gestational age), antepartum 12/09/2013   DENTAL CARIES 12/22/2009   Thyrotoxicosis 08/03/2008   Constipation 07/29/2008   ACNE, MILD 07/29/2008   Anemia 06/30/2008   Ulcerative colitis ([place]) 11/20/2005    Past Surgical History:  Procedure Laterality Date   CESAREAN SECTION N/A  12/09/2013   Procedure: Primary Cesarean Section Delivery Baby Girl @ 1959, [place] 8/8;  Surgeon: Logan Bores, MD;  Location: [facility];  Service: Obstetrics;  Laterality: N/A;   CESAREAN SECTION N/A 08/14/2017   Procedure: CESAREAN SECTION;  Surgeon: Paula Compton, MD;  Location: [facility];  Service: Obstetrics;  Laterality: N/A;   CESAREAN SECTION N/A 03/23/2020   Procedure: CESAREAN SECTION;  Surgeon: Osborne Oman, MD;  Location: [facility];  Service: Obstetrics;  Laterality: N/A;   COLONOSCOPY  01/19/2011   Dr Penelope Coop. no active UC   COLONOSCOPY  2019   KN-prep exc-   COLONOSCOPY W/ BIOPSIES N/A 04/2014   Dr Deatra Ina: Left sided colitis from prox descending to rectum; no inflammation at or proximal to splenic flexure   DILATION AND EVACUATION  08/21/2012   Procedure: DILATATION AND EVACUATION;  Surgeon: Logan Bores, MD;  Location: [facility];  Service: Gynecology   TONSILLECTOMY      OB History     Gravida  6   Para  4   Term  4   Preterm      AB  2   Living  4      SAB  1   IAB  1   Ectopic      Multiple  0   Live Births  4            Home Medications    Prior to Admission medications   Medication Sig Start Date End Date Taking?  Authorizing Provider  mesalamine (CANASA) 1000 MG suppository Place 1 suppository (1,000 mg total) rectally at bedtime. 1 per rectum qhs for 2 weeks. 12/26/21   Mauri Pole, MD  ondansetron (ZOFRAN-ODT) 4 MG disintegrating tablet Take 1 tablet (4 mg total) by mouth every 8 (eight) hours as needed for nausea or vomiting. 01/05/22   Sharion Balloon, NP  vedolizumab (ENTYVIO) 300 MG injection Inject 300 mg into the vein every 8 (eight) weeks. 08/26/20   Mauri Pole, MD  zolpidem (AMBIEN) 10 MG tablet Take 10 mg by mouth at bedtime as needed. 11/21/21   [provider]    Family History Family History  Problem Relation Age of Onset   57 / Stillbirths Other    Diabetes Sister     Hypotension Neg Hx    Alcohol abuse Neg Hx    Arthritis Neg Hx    Asthma Neg Hx    Birth defects Neg Hx    COPD Neg Hx    Depression Neg Hx    Drug abuse Neg Hx    Hearing loss Neg Hx    Heart disease Neg Hx    Hyperlipidemia Neg Hx    Hypertension Neg Hx    Kidney disease Neg Hx    Learning disabilities Neg Hx    Mental illness Neg Hx    Mental retardation Neg Hx    Colon cancer Neg Hx    Colon polyps Neg Hx    Esophageal cancer Neg Hx    Stomach cancer Neg Hx    Rectal cancer Neg Hx     Social History Social History   Tobacco Use   Smoking status: Former    Years: 3.00    Types: Cigarettes    Quit date: 12/13/2018    Years since quitting: 3.0   Smokeless tobacco: Never   Tobacco comments:    smokes a cig "every blue moon"  Vaping Use   Vaping Use: Never used  Substance Use Topics   Alcohol use: Not Currently    Comment: special occasions   Drug use: No     Allergies   Latex and Remicade [infliximab]   Review of Systems Review of Systems  Constitutional:  Negative for chills and fever.  Respiratory:  Negative for cough and shortness of breath.   Cardiovascular:  Negative for chest pain and palpitations.  Gastrointestinal:  Positive for abdominal pain, diarrhea, nausea and vomiting. Negative for blood in stool.  Genitourinary:  Negative for dysuria and hematuria.  Skin:  Negative for color change and rash.  All other systems reviewed and are negative.   Physical Exam Triage Vital Signs ED Triage Vitals  Enc Vitals Group     BP      Pulse      Resp      Temp      Temp src      SpO2      Weight      Height      Head Circumference      Peak Flow      Pain Score      Pain Loc      Pain Edu?      Excl. in [place]?    No data found.  Updated Vital Signs BP 105/71    Pulse 95    Temp 98.2 F (36.8 C)    Resp 18    SpO2 96%   Visual Acuity Right Eye Distance:   Left Eye Distance:  Bilateral Distance:    Right Eye Near:   Left Eye Near:     Bilateral Near:     Physical Exam Vitals and nursing note reviewed.  Constitutional:      General: She is not in acute distress.    Appearance: Normal appearance. She is well-developed. She is not ill-appearing.  HENT:     Mouth/Throat:     Mouth: Mucous membranes are moist.  Eyes:     Conjunctiva/sclera: Conjunctivae normal.  Cardiovascular:     Rate and Rhythm: Normal rate and regular rhythm.     Heart sounds: Normal heart sounds.  Pulmonary:     Effort: Pulmonary effort is normal. No respiratory distress.     Breath sounds: Normal breath sounds.  Abdominal:     General: Bowel sounds are normal. There is no distension.     Palpations: Abdomen is soft.     Tenderness: There is no abdominal tenderness. There is no right CVA tenderness, left CVA tenderness, guarding or rebound.  Musculoskeletal:     Cervical back: Neck supple.  Skin:    General: Skin is warm and dry.  Neurological:     Mental Status: She is alert and oriented to person, place, and time.     Gait: Gait normal.  Psychiatric:        Mood and Affect: Mood normal.        Behavior: Behavior normal.     UC Treatments / Results  Labs (all labs ordered are listed, but only abnormal results are displayed) Labs Reviewed  POCT URINALYSIS DIP (MANUAL ENTRY) - Abnormal; Notable for the following components:      Result Value   Bilirubin, UA small (*)    Ketones, POC UA large (80) (*)    Spec Grav, UA >=1.030 (*)    Protein Ur, POC trace (*)    All other components within normal limits  POCT URINE PREGNANCY    EKG   Radiology No results found.  Procedures Procedures (including critical care time)  Medications Ordered in UC Medications  ondansetron (ZOFRAN-ODT) disintegrating tablet 4 mg (4 mg Oral Given 01/05/22 1939)    Initial Impression / Assessment and Plan / UC Course  I have reviewed the triage vital signs and the nursing notes.  Pertinent labs & imaging results that were available during my  care of the patient were reviewed by me and considered in my medical decision making (see chart for details).  Generalized abdominal pain, nausea, vomiting, diarrhea.  Abdomen is soft and nontender.  Patient denies blood in her stool, fever, or other symptoms.  Treating with Zofran and clear liquids.  Advance diet as tolerated.  Strict ED precautions discussed.  Instructed patient to follow-up with her PCP or gastroenterologist if her symptoms continue.  She agrees to plan of care.   Final Clinical Impressions(s) / UC Diagnoses   Final diagnoses:  Generalized abdominal pain  Nausea vomiting and diarrhea     Discharge Instructions      Go to the emergency department if you have abdominal pain, are unable to keep yourself hydrated at home, or have other concerning symptoms.    Take the antinausea medication as directed.  Keep yourself hydrated with clear liquids, such as water and Gatorade.    Follow up with your primary care provider if your symptoms are not improving.          ED Prescriptions     Medication Sig Dispense Auth. Provider   ondansetron (ZOFRAN-ODT) 4  MG disintegrating tablet  (Status: Discontinued) Take 1 tablet (4 mg total) by mouth every 8 (eight) hours as needed for nausea or vomiting. 20 tablet Sharion Balloon, NP   ondansetron (ZOFRAN-ODT) 4 MG disintegrating tablet Take 1 tablet (4 mg total) by mouth every 8 (eight) hours as needed for nausea or vomiting. 20 tablet Sharion Balloon, NP      PDMP not reviewed this encounter.   Sharion Balloon, NP 01/05/22 1946

## 2022-01-05 NOTE — ED Triage Notes (Signed)
Pt here with generalized abdominal cramping, nausea and vomiting x today. Hx of ulcerative colitis.

## 2022-01-05 NOTE — Discharge Instructions (Addendum)
Go to the emergency department if you have abdominal pain, are unable to keep yourself hydrated at home, or have other concerning symptoms.    Take the antinausea medication as directed.  Keep yourself hydrated with clear liquids, such as water and Gatorade.    Follow up with your primary care provider if your symptoms are not improving.

## 2022-01-06 ENCOUNTER — Telehealth: Payer: Self-pay

## 2022-01-06 NOTE — Telephone Encounter (Signed)
Called the patient to set up her follow up appointment. She needs to be seen in March to discuss management of IBD. She is presently scheduled for 02/06/22 at 2:30. Need to know if this will work on her schedule. Okay to schedule with APP. She has seen Alonza Bogus, PA in the past.

## 2022-01-10 NOTE — Telephone Encounter (Signed)
Spoke with the patient. Agrees to her appointment 02/06/22 at 2:30 pm. Appointment card mailed per her request.

## 2022-01-20 ENCOUNTER — Telehealth: Payer: Self-pay | Admitting: Gastroenterology

## 2022-01-20 NOTE — Telephone Encounter (Signed)
Patient called requesting to speak with a nurse regarding paperwork needed for SSI. ?

## 2022-01-20 NOTE — Telephone Encounter (Signed)
Discussed briefly with the patient. She is applying for assistance through the [facility] office. They require records. Explained the process of obtaining the medical records. No further questions at this time. ?

## 2022-01-21 ENCOUNTER — Encounter: Payer: Self-pay | Admitting: Emergency Medicine

## 2022-01-21 ENCOUNTER — Other Ambulatory Visit: Payer: Self-pay

## 2022-01-21 ENCOUNTER — Ambulatory Visit
Admission: EM | Admit: 2022-01-21 | Discharge: 2022-01-21 | Disposition: A | Discharge status: Home / Self Care | Payer: Medicaid Other | Attending: Family Medicine | Admitting: Family Medicine

## 2022-01-21 DIAGNOSIS — J029 Acute pharyngitis, unspecified: Secondary | ICD-10-CM | POA: Diagnosis not present

## 2022-01-21 DIAGNOSIS — J0110 Acute frontal sinusitis, unspecified: Secondary | ICD-10-CM | POA: Diagnosis not present

## 2022-01-21 LAB — POCT RAPID STREP A (OFFICE): Rapid Strep A Screen: NEGATIVE

## 2022-01-21 MED ORDER — AMOXICILLIN-POT CLAVULANATE 875-125 MG PO TABS: 1.0000 | ORAL_TABLET | Freq: Two times a day (BID) | ORAL | 0 refills | Status: DC

## 2022-01-21 MED ORDER — PROMETHAZINE-DM 6.25-15 MG/5ML PO SYRP: 5.0000 mL | ORAL_SOLUTION | Freq: Three times a day (TID) | ORAL | 0 refills | Status: DC | PRN

## 2022-01-21 NOTE — Discharge Instructions (Signed)
Your strep is negative.  ?Treating for sinusitis. ?Throat sore will resolve as sinus symptoms resolve. ?Hydrate well with fluids. ?

## 2022-01-21 NOTE — ED Triage Notes (Signed)
Pt presents with cough, runny nose, ST x 2 weeks  ?

## 2022-01-24 NOTE — ED Provider Notes (Signed)
?[facility] ? ? ? ?CSN: [id number] ?Arrival date & time: 01/21/22  1035 ? ? ?  ? ?History   ?Chief Complaint ?Chief Complaint  ?Patient presents with  ? Cough  ? Nasal Congestion  ? Sore Throat  ? ? ?HPI ?Joyce Solomon is a 36 y.o. female.  ? ?HPI ?Patient presents today with a for evaluation of URI symptoms present for 2 weeks. Current symptoms include cough, runny nose, sore throat x 2 weeks. Denies any sick contacts.  ?She has taken multiple over the counter medications without relief of symptoms.  Patient is afebrile.  Denies SOB or wheezing. ? ?Past Medical History:  ?Diagnosis Date  ? Anxiety   ? Blood transfusion without reported diagnosis   ? Headache   ? Iron Deficiency Anemia 2008  ? s/p transfusions 2009, 2010; inconsistent compliance with PO iron.   ? Non compliance w medication regimen 06/2015  ? stopped Remicade, may not have been taking Azulfidine.  ? Seasonal allergies   ? Thyrotoxicosis   ? Ulcerative Colitis 2007  ? on Humira  ? ? ?Patient Active Problem List  ? Diagnosis Date Noted  ? Subclinical hyperthyroidism 08/12/2020  ? C. difficile diarrhea 03/24/2020  ? History of 2 cesarean sections 03/24/2020  ? Postpartum care following cesarean delivery 03/24/2020  ? Group B Streptococcus carrier, +RV culture, currently pregnant 03/14/2020  ? Supervision of high risk pregnancy, antepartum 09/09/2019  ? Anal fissure 09/11/2018  ? Rectal bleeding 04/23/2014  ? History of cesarean delivery affecting pregnancy 12/10/2013  ? Prior pregnancy complicated by SGA (small for gestational age), antepartum 12/09/2013  ? DENTAL CARIES 12/22/2009  ? Thyrotoxicosis 08/03/2008  ? Constipation 07/29/2008  ? ACNE, MILD 07/29/2008  ? Anemia 06/30/2008  ? Ulcerative colitis ([place]) 11/20/2005  ? ? ?Past Surgical History:  ?Procedure Laterality Date  ? CESAREAN SECTION N/A 12/09/2013  ? Procedure: Primary Cesarean Section Delivery Baby Girl @ [phone], Apgars 8/8;  Surgeon: Logan Bores, MD;  Location: [facility];   Service: Obstetrics;  Laterality: N/A;  ? CESAREAN SECTION N/A 08/14/2017  ? Procedure: CESAREAN SECTION;  Surgeon: Paula Compton, MD;  Location: [place];  Service: Obstetrics;  Laterality: N/A;  ? CESAREAN SECTION N/A 03/23/2020  ? Procedure: CESAREAN SECTION;  Surgeon: Osborne Oman, MD;  Location: [facility];  Service: Obstetrics;  Laterality: N/A;  ? COLONOSCOPY  01/19/2011  ? Dr Penelope Coop. no active UC  ? COLONOSCOPY  2019  ? KN-prep exc-  ? COLONOSCOPY W/ BIOPSIES N/A 04/2014  ? Dr Deatra Ina: Left sided colitis from prox descending to rectum; no inflammation at or proximal to splenic flexure  ? DILATION AND EVACUATION  08/21/2012  ? Procedure: DILATATION AND EVACUATION;  Surgeon: Logan Bores, MD;  Location: [facility];  Service: Gynecology  ? TONSILLECTOMY    ? ? ?OB History   ? ? Gravida  ?6  ? Para  ?4  ? Term  ?4  ? Preterm  ?   ? AB  ?2  ? Living  ?4  ?  ? ? SAB  ?1  ? IAB  ?1  ? Ectopic  ?   ? Multiple  ?0  ? Live Births  ?4  ?   ?  ?  ? ? ? ?Home Medications   ? ?Prior to Admission medications   ?Medication Sig Start Date End Date Taking? Authorizing Provider  ?amoxicillin-clavulanate (AUGMENTIN) 875-125 MG tablet Take 1 tablet by mouth 2 (two) times daily. 01/21/22  Yes Scot Jun, FNP  ?promethazine-dextromethorphan (PROMETHAZINE-DM) 6.25-15 MG/5ML syrup Take 5 mLs by mouth 3 (three) times daily as needed for cough. 01/21/22  Yes Scot Jun, FNP  ?mesalamine (CANASA) 1000 MG suppository Place 1 suppository (1,000 mg total) rectally at bedtime. 1 per rectum qhs for 2 weeks. 12/26/21   Mauri Pole, MD  ?ondansetron (ZOFRAN-ODT) 4 MG disintegrating tablet Take 1 tablet (4 mg total) by mouth every 8 (eight) hours as needed for nausea or vomiting. 01/05/22   Sharion Balloon, NP  ?vedolizumab (ENTYVIO) 300 MG injection Inject 300 mg into the vein every 8 (eight) weeks. 08/26/20   Mauri Pole, MD  ?zolpidem (AMBIEN) 10 MG tablet Take 10 mg by mouth at bedtime as needed.  11/21/21   [provider]  ? ? ?Family History ?Family History  ?Problem Relation Age of Onset  ? Miscarriages / Stillbirths Other   ? Diabetes Sister   ? Hypotension Neg Hx   ? Alcohol abuse Neg Hx   ? Arthritis Neg Hx   ? Asthma Neg Hx   ? Birth defects Neg Hx   ? COPD Neg Hx   ? Depression Neg Hx   ? Drug abuse Neg Hx   ? Hearing loss Neg Hx   ? Heart disease Neg Hx   ? Hyperlipidemia Neg Hx   ? Hypertension Neg Hx   ? Kidney disease Neg Hx   ? Learning disabilities Neg Hx   ? Mental illness Neg Hx   ? Mental retardation Neg Hx   ? Colon cancer Neg Hx   ? Colon polyps Neg Hx   ? Esophageal cancer Neg Hx   ? Stomach cancer Neg Hx   ? Rectal cancer Neg Hx   ? ? ?Social History ?Social History  ? ?Tobacco Use  ? Smoking status: Former  ?  Years: 3.00  ?  Types: Cigarettes  ?  Quit date: 12/13/2018  ?  Years since quitting: 3.1  ? Smokeless tobacco: Never  ? Tobacco comments:  ?  smokes a cig "every blue moon"  ?Vaping Use  ? Vaping Use: Never used  ?Substance Use Topics  ? Alcohol use: Not Currently  ?  Comment: special occasions  ? Drug use: No  ? ? ? ?Allergies   ?Latex and Remicade [infliximab] ? ? ?Review of Systems ?Review of Systems ?Pertinent negatives listed in HPI  ? ?Physical Exam ?Triage Vital Signs ?ED Triage Vitals  ?Enc Vitals Group  ?   BP 01/21/22 1252 108/75  ?   Pulse Rate 01/21/22 1252 90  ?   Resp 01/21/22 1252 16  ?   Temp 01/21/22 1252 99 ?F (37.2 ?C)  ?   Temp Source 01/21/22 1252 Oral  ?   SpO2 01/21/22 1252 97 %  ?   Weight --   ?   Height --   ?   Head Circumference --   ?   Peak Flow --   ?   Pain Score 01/21/22 1254 10  ?   Pain Loc --   ?   Pain Edu? --   ?   Excl. in [place]? --   ? ?No data found. ? ?Updated Vital Signs ?BP 108/75 (BP Location: Left Arm)   Pulse 90   Temp 99 ?F (37.2 ?C) (Oral)   Resp 16   LMP  (LMP Unknown)   SpO2 97%  ? ?Visual Acuity ?Right Eye Distance:   ?Left Eye Distance:   ?  Bilateral Distance:   ? ?Right Eye Near:   ?Left Eye Near:    ?Bilateral Near:     ? ?Physical Exam ? ?General Appearance:    Alert, cooperative, no distress  ?HENT:   Normocephalic, ears normal, nares mucosal edema with congestion, rhinorrhea, oropharynx patent with mild erythema   ?Eyes:    PERRL, conjunctiva/corneas clear, EOM's intact       ?Lungs:     Clear to auscultation bilaterally, respirations unlabored  ?Heart:    Regular rate and rhythm  ?Neurologic:   Awake, alert, oriented x 3. No apparent focal neurological           defect.   ?  ?UC Treatments / Results  ?Labs ?(all labs ordered are listed, but only abnormal results are displayed) ?Labs Reviewed  ?POCT RAPID STREP A (OFFICE)  ? ? ?EKG ? ? ?Radiology ?No results found. ? ?Procedures ?Procedures (including critical care time) ? ?Medications Ordered in UC ?Medications - No data to display ? ?Initial Impression / Assessment and Plan / UC Course  ?I have reviewed the triage vital signs and the nursing notes. ? ?Pertinent labs & imaging results that were available during my care of the patient were reviewed by me and considered in my medical decision making (see chart for details). ? ?  ?Acute non recurrent sinusitis  ?Throat soreness , rapid strep negative. No culture ordered as  ?Augmentin and Promethazine for cough. ?Strict return precautions. ?Final Clinical Impressions(s) / UC Diagnoses  ? ?Final diagnoses:  ?Acute non-recurrent frontal sinusitis  ?Throat soreness  ? ? ? ?Discharge Instructions   ? ?  ?Your strep is negative.  ?Treating for sinusitis. ?Throat sore will resolve as sinus symptoms resolve. ?Hydrate well with fluids. ? ? ?ED Prescriptions   ? ? Medication Sig Dispense Auth. Provider  ? amoxicillin-clavulanate (AUGMENTIN) 875-125 MG tablet Take 1 tablet by mouth 2 (two) times daily. 20 tablet Scot Jun, FNP  ? promethazine-dextromethorphan (PROMETHAZINE-DM) 6.25-15 MG/5ML syrup Take 5 mLs by mouth 3 (three) times daily as needed for cough. 180 mL Scot Jun, FNP  ? ?  ? ?PDMP not reviewed this  encounter. ?  ?Scot Jun, FNP ?01/24/22 2159 ? ?

## 2022-02-06 ENCOUNTER — Ambulatory Visit (INDEPENDENT_AMBULATORY_CARE_PROVIDER_SITE_OTHER): Payer: Medicaid Other | Admitting: Gastroenterology

## 2022-02-06 ENCOUNTER — Encounter: Payer: Self-pay | Admitting: Gastroenterology

## 2022-02-06 ENCOUNTER — Other Ambulatory Visit (INDEPENDENT_AMBULATORY_CARE_PROVIDER_SITE_OTHER): Payer: Medicaid Other

## 2022-02-06 VITALS — BP 100/60 | HR 85 | Ht 59.0 in | Wt 131.0 lb

## 2022-02-06 DIAGNOSIS — K5180 Other ulcerative colitis without complications: Secondary | ICD-10-CM

## 2022-02-06 DIAGNOSIS — K51011 Ulcerative (chronic) pancolitis with rectal bleeding: Secondary | ICD-10-CM

## 2022-02-06 DIAGNOSIS — K51911 Ulcerative colitis, unspecified with rectal bleeding: Secondary | ICD-10-CM

## 2022-02-06 LAB — CBC WITH DIFFERENTIAL/PLATELET
Basophils Absolute: 0 10*3/uL (ref 0.0–0.1)
Basophils Relative: 0.4 % (ref 0.0–3.0)
Eosinophils Absolute: 0.3 10*3/uL (ref 0.0–0.7)
Eosinophils Relative: 2.2 % (ref 0.0–5.0)
HCT: 41 % (ref 36.0–46.0)
Hemoglobin: 13.6 g/dL (ref 12.0–15.0)
Lymphocytes Relative: 29.7 % (ref 12.0–46.0)
Lymphs Abs: 3.4 10*3/uL (ref 0.7–4.0)
MCHC: 33.1 g/dL (ref 30.0–36.0)
MCV: 92.5 fl (ref 78.0–100.0)
Monocytes Absolute: 0.9 10*3/uL (ref 0.1–1.0)
Monocytes Relative: 7.7 % (ref 3.0–12.0)
Neutro Abs: 6.8 10*3/uL (ref 1.4–7.7)
Neutrophils Relative %: 60 % (ref 43.0–77.0)
Platelets: 339 10*3/uL (ref 150.0–400.0)
RBC: 4.43 Mil/uL (ref 3.87–5.11)
RDW: 14.3 % (ref 11.5–15.5)
WBC: 11.3 10*3/uL — ABNORMAL HIGH (ref 4.0–10.5)

## 2022-02-06 LAB — COMPREHENSIVE METABOLIC PANEL
ALT: 9 U/L (ref 0–35)
AST: 10 U/L (ref 0–37)
Albumin: 4.1 g/dL (ref 3.5–5.2)
Alkaline Phosphatase: 63 U/L (ref 39–117)
BUN: 8 mg/dL (ref 6–23)
CO2: 25 mEq/L (ref 19–32)
Calcium: 9.1 mg/dL (ref 8.4–10.5)
Chloride: 107 mEq/L (ref 96–112)
Creatinine, Ser: 0.56 mg/dL (ref 0.40–1.20)
GFR: 117.57 mL/min (ref >60.00)
Glucose, Bld: 76 mg/dL (ref 70–99)
Potassium: 3.7 mEq/L (ref 3.5–5.1)
Sodium: 139 mEq/L (ref 135–145)
Total Bilirubin: 0.3 mg/dL (ref 0.2–1.2)
Total Protein: 7.1 g/dL (ref 6.0–8.3)

## 2022-02-06 LAB — VITAMIN D 25 HYDROXY (VIT D DEFICIENCY, FRACTURES): VITD: <7 ng/mL — ABNORMAL LOW (ref 30.00–100.00)

## 2022-02-06 LAB — SEDIMENTATION RATE: Sed Rate: 37 mm/hr — ABNORMAL HIGH (ref 0–20)

## 2022-02-06 LAB — HIGH SENSITIVITY CRP: CRP, High Sensitivity: 0.49 mg/L (ref 0.000–5.000)

## 2022-02-06 MED ORDER — MESALAMINE 1000 MG RE SUPP: 1000.0000 mg | Freq: Every day | RECTAL | 12 refills | Status: DC

## 2022-02-06 MED ORDER — MESALAMINE 1.2 G PO TBEC: 4.8000 g | DELAYED_RELEASE_TABLET | Freq: Every day | ORAL | 11 refills | Status: DC

## 2022-02-06 NOTE — Patient Instructions (Signed)
Your provider has requested that you go to the basement level for lab work before leaving today. Press "B" on the elevator. The lab is located at the first door on the left as you exit the elevator.  ? ?We have sent the following medications to your pharmacy for you to pick up at your convenience:   Lialda  Canasa suppositories  ? ?Due to recent changes in healthcare laws, you may see the results of your imaging and laboratory studies on MyChart before your provider has had a chance to review them.  We understand that in some cases there may be results that are confusing or concerning to you. Not all laboratory results come back in the same time frame and the provider may be waiting for multiple results in order to interpret others.  Please give [place] 48 hours in order for your provider to thoroughly review all the results before contacting the office for clarification of your results.   ? ?If you are age 7 or older, your body mass index should be between 23-30. Your Body mass index is 26.46 kg/m?[name] If this is out of the aforementioned range listed, please consider follow up with your Primary Care Provider. ? ?If you are age 32 or younger, your body mass index should be between 19-25. Your Body mass index is 26.46 kg/m?[name] If this is out of the aformentioned range listed, please consider follow up with your Primary Care Provider.  ? ?________________________________________________________ ? ?The [place] [facility] providers would like to encourage you to use [facility] to communicate with providers for non-urgent requests or questions.  Due to long hold times on the telephone, sending your provider a message by [facility] may be a faster and more efficient way to get a response.  Please allow 48 business hours for a response.  Please remember that this is for non-urgent requests.  ?_______________________________________________________  ? ?Thank you for choosing [facility] ? ?Kavitha Nandigam,MD  ?

## 2022-02-06 NOTE — Progress Notes (Signed)
? ?       ? ?Joyce Solomon    [id number]    01-28-1986 ? ?Primary Care Physician:[facility] ? ?Referring Physician: Pa, [facility] ?[place],   [id number] ? ? ?Chief complaint: Ulcerative colitis ? ?HPI: ? ?36 year old very pleasant female here for follow up visit for ulcerative colitis ? ?Colonoscopy 12/26/21 ?- Mucosal ulceration in distal rectum. Biopsied. ?- Decreased mucosa vascular pattern in the sigmoid colon, in the descending colon, in the ?transverse colon, in the ascending colon and in the cecum. ?- Non-bleeding internal hemorrhoids. ? ?Surgical [P], colon, rectum ?CHRONIC ACTIVE COLITIS/PROCTITIS WITH CRYPTITIS (SEE MICROSCOPIC COMMENT) ?NEGATIVE FOR DYSPLASIA AND GRANULOMAS ?  ?She does not know if she is having rectal bleeding as she usually does not check her stool but denies any diarrhea.  She had an episode of abdominal pain and diarrhea associated with GI infection that has since resolved ?  ?She is on Entyvio infusions every 8 weeks ? ?Last major ulcerative colitis flare was during her pregnancy prior to initiation of Entyvio. ?She had a minor flare of symptoms intermittently, last episode in May 2022 that was treated with Prednisone taper ?  ?  ?Relevant GI history: ?She was in clinical remission on Humira, self discontinued after she became pregnant, restarted in Nov 2020 and she stopped it again after 3 months as she thought it was not helping ?  ?Colonoscopy Apr 10, 2018 negative for active colitis, chronic mucosal changes with altered vascularity and mild erythema.  Random biopsies negative for active inflammation. ?  ?Drug trough 5.9 with undetectable antibody for Humira ?  ?She was started on Humira March 2017 ?  ?Colonoscopy June 2015 showed moderately severe left-sided colitis ?  ?Allergic reaction to Remicade in 2016.  Elevated antibody level to infliximab 1622 and undetectable drug level ?  ?Flexible sigmoidoscopy January 2017 showed severe colitis  left colon with ulceration, biopsies showed moderately active colitis. ?  ? ? ? ? ?Outpatient Encounter Medications as of 02/06/2022  ?Medication Sig  ? vedolizumab (ENTYVIO) 300 MG injection Inject 300 mg into the vein every 8 (eight) weeks.  ? zolpidem (AMBIEN) 10 MG tablet Take 10 mg by mouth at bedtime as needed.  ? [DISCONTINUED] amoxicillin-clavulanate (AUGMENTIN) 875-125 MG tablet Take 1 tablet by mouth 2 (two) times daily. (Patient not taking: Reported on 02/06/2022)  ? [DISCONTINUED] mesalamine (CANASA) 1000 MG suppository Place 1 suppository (1,000 mg total) rectally at bedtime. 1 per rectum qhs for 2 weeks. (Patient not taking: Reported on 02/06/2022)  ? [DISCONTINUED] ondansetron (ZOFRAN-ODT) 4 MG disintegrating tablet Take 1 tablet (4 mg total) by mouth every 8 (eight) hours as needed for nausea or vomiting.  ? [DISCONTINUED] promethazine-dextromethorphan (PROMETHAZINE-DM) 6.25-15 MG/5ML syrup Take 5 mLs by mouth 3 (three) times daily as needed for cough.  ? ?No facility-administered encounter medications on file as of 02/06/2022.  ? ? ?Allergies as of 02/06/2022 - Review Complete 02/06/2022  ?Allergen Reaction Noted  ? Latex Rash 05/20/2009  ? Remicade [infliximab] Itching 08/07/2015  ? ? ?Past Medical History:  ?Diagnosis Date  ? Anxiety   ? Blood transfusion without reported diagnosis   ? Headache   ? Iron Deficiency Anemia 2008  ? s/p transfusions 2009, 2010; inconsistent compliance with PO iron.   ? Non compliance w medication regimen 06/2015  ? stopped Remicade, may not have been taking Azulfidine.  ? Seasonal allergies   ? Thyrotoxicosis   ? Ulcerative Colitis 2007  ? on Humira  ? ? ?  Past Surgical History:  ?Procedure Laterality Date  ? CESAREAN SECTION N/A 12/09/2013  ? Procedure: Primary Cesarean Section Delivery Baby Girl @ [phone], Apgars 8/8;  Surgeon: Logan Bores, MD;  Location: [facility];  Service: Obstetrics;  Laterality: N/A;  ? CESAREAN SECTION N/A 08/14/2017  ? Procedure: CESAREAN  SECTION;  Surgeon: Paula Compton, MD;  Location: [facility];  Service: Obstetrics;  Laterality: N/A;  ? CESAREAN SECTION N/A 03/23/2020  ? Procedure: CESAREAN SECTION;  Surgeon: Osborne Oman, MD;  Location: [facility];  Service: Obstetrics;  Laterality: N/A;  ? COLONOSCOPY  01/19/2011  ? Dr Penelope Coop. no active UC  ? COLONOSCOPY  2019  ? KN-prep exc-  ? COLONOSCOPY W/ BIOPSIES N/A 04/2014  ? Dr Deatra Ina: Left sided colitis from prox descending to rectum; no inflammation at or proximal to splenic flexure  ? DILATION AND EVACUATION  08/21/2012  ? Procedure: DILATATION AND EVACUATION;  Surgeon: Logan Bores, MD;  Location: [facility];  Service: Gynecology  ? TONSILLECTOMY    ? ? ?Family History  ?Problem Relation Age of Onset  ? Miscarriages / Stillbirths Other   ? Diabetes Sister   ? Hypotension Neg Hx   ? Alcohol abuse Neg Hx   ? Arthritis Neg Hx   ? Asthma Neg Hx   ? Birth defects Neg Hx   ? COPD Neg Hx   ? Depression Neg Hx   ? Drug abuse Neg Hx   ? Hearing loss Neg Hx   ? Heart disease Neg Hx   ? Hyperlipidemia Neg Hx   ? Hypertension Neg Hx   ? Kidney disease Neg Hx   ? Learning disabilities Neg Hx   ? Mental illness Neg Hx   ? Mental retardation Neg Hx   ? Colon cancer Neg Hx   ? Colon polyps Neg Hx   ? Esophageal cancer Neg Hx   ? Stomach cancer Neg Hx   ? Rectal cancer Neg Hx   ? ? ?Social History  ? ?Socioeconomic History  ? Marital status: Single  ?  Spouse name: Not on file  ? Number of children: 4  ? Years of education: Not on file  ? Highest education level: Not on file  ?Occupational History  ? Occupation: unemployed  ?Tobacco Use  ? Smoking status: Former  ?  Years: 3.00  ?  Types: Cigarettes  ?  Quit date: 12/13/2018  ?  Years since quitting: 3.1  ? Smokeless tobacco: Never  ? Tobacco comments:  ?  smokes a cig "every blue moon"  ?Vaping Use  ? Vaping Use: Never used  ?Substance and Sexual Activity  ? Alcohol use: Not Currently  ?  Comment: special occasions  ? Drug use: No  ? Sexual  activity: Yes  ?  Birth control/protection: None  ?Other Topics Concern  ? Not on file  ?Social History Narrative  ? Not on file  ? ?Social Determinants of Health  ? ?Financial Resource Strain: Not on file  ?Food Insecurity: Not on file  ?Transportation Needs: Not on file  ?Physical Activity: Not on file  ?Stress: Not on file  ?Social Connections: Not on file  ?Intimate Partner Violence: Not on file  ? ? ? ? ?Review of systems: ?All other review of systems negative except as mentioned in the HPI. ? ? ?Physical Exam: ?Vitals:  ? 02/06/22 1428  ?BP: 100/60  ?Pulse: 85  ?SpO2: 98%  ? ?Body mass index is 26.46 kg/m?. ?Gen:  No acute distress ?HEENT:  sclera anicteric ?Abd:      soft, non-tender; no palpable masses, no distension ?Ext:    No edema ?Neuro: alert and oriented x 3 ?Psych: normal mood and affect ? ?Data Reviewed: ? ?Reviewed labs, radiology imaging, old records and pertinent past GI work up ? ? ?Assessment and Plan/Recommendations: ? ?36 year old female with history of  ulcerative colitis pan colitis, iron deficiency anemia ?  ?She had evidence of active inflammation in the rectum consistent with persistent ulcerative proctitis though patient does not complain of any significant clinical symptoms ?We will check Entyvio drug trough and antibody level, may need to consider increasing the dose with infusions every 6 weeks if she has low drug trough ?Follow-up CBC, CMP, CRP and ESR ? ?Advised patient to use Canasa suppositories daily for 4 weeks for improvement of ulcerative proctitis ?Start Lialda 4 capsules, 4.8 g daily ? ?Return in 4 to 6 weeks ?  ?This visit required 40 minutes of patient care (this includes precharting, chart review, review of results, face-to-face time used for counseling as well as treatment plan and follow-up. The patient was provided an opportunity to ask questions and all were answered. The patient agreed with the plan and demonstrated an understanding of the instructions. ? ?K.  Denzil Magnuson , MD ?  ? ?CC: [facility] ? ? ?

## 2022-02-07 ENCOUNTER — Telehealth: Payer: Self-pay | Admitting: *Deleted

## 2022-02-07 NOTE — Telephone Encounter (Signed)
Prior authorization done on Mesalamine 1.2  PA case number- [id number] approved until 02/07/2023 ? ? ? ?Prior authorization done on Mesalamine Suppositories and approved until 02/07/2023  PA case number [id number]  ?

## 2022-02-09 LAB — QUANTIFERON-TB GOLD PLUS
Mitogen-NIL: >10 IU/mL
NIL: 0.03 IU/mL
QuantiFERON-TB Gold Plus: NEGATIVE
TB1-NIL: 0 IU/mL
TB2-NIL: <0 IU/mL

## 2022-02-14 LAB — VEDOLIZUMAB AND ANTI-VEDO AB
Anti-Vedolizumab Antibody: <25 ng/mL
Vedolizumab: 18 ug/mL

## 2022-02-15 ENCOUNTER — Other Ambulatory Visit: Payer: Self-pay

## 2022-02-15 MED ORDER — VITAMIN D (ERGOCALCIFEROL) 1.25 MG (50000 UNIT) PO CAPS: 50000.0000 [IU] | ORAL_CAPSULE | ORAL | 0 refills | Status: DC

## 2022-03-15 ENCOUNTER — Ambulatory Visit: Payer: Medicaid Other | Admitting: Gastroenterology

## 2022-03-29 ENCOUNTER — Other Ambulatory Visit: Payer: Self-pay

## 2022-06-09 ENCOUNTER — Other Ambulatory Visit: Payer: Self-pay | Admitting: Internal Medicine

## 2022-06-12 LAB — COMPLETE METABOLIC PANEL WITH GFR
AG Ratio: 1.4 (calc) (ref 1.0–2.5)
ALT: 10 U/L (ref 6–29)
AST: 12 U/L (ref 10–30)
Albumin: 4.1 g/dL (ref 3.6–5.1)
Alkaline phosphatase (APISO): 71 U/L (ref 31–125)
BUN: 7 mg/dL (ref 7–25)
CO2: 19 mmol/L — ABNORMAL LOW (ref 20–32)
Calcium: 9.1 mg/dL (ref 8.6–10.2)
Chloride: 106 mmol/L (ref 98–110)
Creat: 0.59 mg/dL (ref 0.50–0.97)
Globulin: 3 g/dL (calc) (ref 1.9–3.7)
Glucose, Bld: 84 mg/dL (ref 65–99)
Potassium: 4.4 mmol/L (ref 3.5–5.3)
Sodium: 139 mmol/L (ref 135–146)
Total Bilirubin: 0.2 mg/dL (ref 0.2–1.2)
Total Protein: 7.1 g/dL (ref 6.1–8.1)
eGFR: 120 mL/min/{1.73_m2} (ref >=60)

## 2022-06-12 LAB — CBC
HCT: 40.4 % (ref 35.0–45.0)
Hemoglobin: 13.5 g/dL (ref 11.7–15.5)
MCH: 30.8 pg (ref 27.0–33.0)
MCHC: 33.4 g/dL (ref 32.0–36.0)
MCV: 92.2 fL (ref 80.0–100.0)
MPV: 11.6 fL (ref 7.5–12.5)
Platelets: 339 10*3/uL (ref 140–400)
RBC: 4.38 10*6/uL (ref 3.80–5.10)
RDW: 12.8 % (ref 11.0–15.0)
WBC: 8.5 10*3/uL (ref 3.8–10.8)

## 2022-06-12 LAB — RPR: RPR Ser Ql: NONREACTIVE

## 2022-06-12 LAB — HIV ANTIBODY (ROUTINE TESTING W REFLEX): HIV 1&2 Ab, 4th Generation: NONREACTIVE

## 2022-06-12 LAB — VITAMIN D 25 HYDROXY (VIT D DEFICIENCY, FRACTURES): Vit D, 25-Hydroxy: 19 ng/mL — ABNORMAL LOW (ref 30–100)

## 2022-06-14 ENCOUNTER — Ambulatory Visit: Payer: Medicaid Other | Admitting: Physician Assistant

## 2022-06-20 ENCOUNTER — Telehealth: Payer: Self-pay | Admitting: Gastroenterology

## 2022-06-20 NOTE — Telephone Encounter (Signed)
Called the patient back. No answer. Left message on her voicemail of my call.

## 2022-06-20 NOTE — Telephone Encounter (Signed)
Patient called stated she is having a flare up and seeking advice.

## 2022-06-26 ENCOUNTER — Other Ambulatory Visit: Payer: Self-pay

## 2022-06-26 ENCOUNTER — Other Ambulatory Visit (INDEPENDENT_AMBULATORY_CARE_PROVIDER_SITE_OTHER): Payer: Medicaid Other

## 2022-06-26 DIAGNOSIS — K51011 Ulcerative (chronic) pancolitis with rectal bleeding: Secondary | ICD-10-CM | POA: Diagnosis not present

## 2022-06-26 DIAGNOSIS — R197 Diarrhea, unspecified: Secondary | ICD-10-CM

## 2022-06-26 LAB — CBC WITH DIFFERENTIAL/PLATELET
Basophils Absolute: 0.1 10*3/uL (ref 0.0–0.1)
Basophils Relative: 0.5 % (ref 0.0–3.0)
Eosinophils Absolute: 0.5 10*3/uL (ref 0.0–0.7)
Eosinophils Relative: 4.4 % (ref 0.0–5.0)
HCT: 40.8 % (ref 36.0–46.0)
Hemoglobin: 13.6 g/dL (ref 12.0–15.0)
Lymphocytes Relative: 35.8 % (ref 12.0–46.0)
Lymphs Abs: 4.1 10*3/uL — ABNORMAL HIGH (ref 0.7–4.0)
MCHC: 33.4 g/dL (ref 30.0–36.0)
MCV: 92.5 fl (ref 78.0–100.0)
Monocytes Absolute: 1.2 10*3/uL — ABNORMAL HIGH (ref 0.1–1.0)
Monocytes Relative: 10 % (ref 3.0–12.0)
Neutro Abs: 5.7 10*3/uL (ref 1.4–7.7)
Neutrophils Relative %: 49.3 % (ref 43.0–77.0)
Platelets: 330 10*3/uL (ref 150.0–400.0)
RBC: 4.41 Mil/uL (ref 3.87–5.11)
RDW: 14.3 % (ref 11.5–15.5)
WBC: 11.5 10*3/uL — ABNORMAL HIGH (ref 4.0–10.5)

## 2022-06-26 MED ORDER — HYDROCORTISONE ACETATE 25 MG RE SUPP: 25.0000 mg | Freq: Every evening | RECTAL | 0 refills | Status: AC

## 2022-06-26 MED ORDER — ENTYVIO 300 MG IV SOLR: 300.0000 mg | INTRAVENOUS | 6 refills | Status: DC

## 2022-06-26 NOTE — Telephone Encounter (Signed)
Patient advised. Rx to [facility]. Orders for the lab.

## 2022-06-26 NOTE — Telephone Encounter (Signed)
Patient returned my call.  She is passing blood and mucous with her stools 5 to 6 times a day. Complains of tenesmus. This has been occurring for "about 2 months." She had Entyvio last weeks. Endorses compliance with infusion. Patient had dental work last month and was given antibiotics then. She states " this was happening before then too."  Patient states she has missed her last appointment.

## 2022-06-26 NOTE — Telephone Encounter (Signed)
Please advise patient to come in for labs, CBC, CMP, CRP, GI path panel and C.diff. Rx for Anusol suppository daily at bedtime X 7-10 days.  Please advise patient to call back in end of this week if has no improvement, will consider prednisone taper if C.diff negative. Office follow up visit next available with me or APP. Thanks

## 2022-06-26 NOTE — Telephone Encounter (Signed)
No APP openings for 3 weeks.

## 2022-06-27 ENCOUNTER — Other Ambulatory Visit: Payer: Medicaid Other

## 2022-06-27 DIAGNOSIS — K51011 Ulcerative (chronic) pancolitis with rectal bleeding: Secondary | ICD-10-CM

## 2022-06-27 DIAGNOSIS — R197 Diarrhea, unspecified: Secondary | ICD-10-CM

## 2022-06-27 LAB — COMPREHENSIVE METABOLIC PANEL
ALT: 11 U/L (ref 0–35)
AST: 13 U/L (ref 0–37)
Albumin: 4.2 g/dL (ref 3.5–5.2)
Alkaline Phosphatase: 69 U/L (ref 39–117)
BUN: 15 mg/dL (ref 6–23)
CO2: 23 mEq/L (ref 19–32)
Calcium: 9.4 mg/dL (ref 8.4–10.5)
Chloride: 103 mEq/L (ref 96–112)
Creatinine, Ser: 0.87 mg/dL (ref 0.40–1.20)
GFR: 85.6 mL/min (ref >60.00)
Glucose, Bld: 78 mg/dL (ref 70–99)
Potassium: 4 mEq/L (ref 3.5–5.1)
Sodium: 140 mEq/L (ref 135–145)
Total Bilirubin: 0.2 mg/dL (ref 0.2–1.2)
Total Protein: 7.7 g/dL (ref 6.0–8.3)

## 2022-06-27 LAB — HIGH SENSITIVITY CRP: CRP, High Sensitivity: 0.9 mg/L (ref 0.000–5.000)

## 2022-06-28 LAB — CLOSTRIDIUM DIFFICILE BY PCR: Toxigenic C. Difficile by PCR: NEGATIVE

## 2022-06-29 LAB — GI PROFILE, STOOL, PCR

## 2022-07-04 ENCOUNTER — Telehealth: Payer: Self-pay | Admitting: Gastroenterology

## 2022-07-04 ENCOUNTER — Other Ambulatory Visit: Payer: Self-pay

## 2022-07-04 MED ORDER — PREDNISONE 10 MG PO TABS: ORAL_TABLET | ORAL | 0 refills | Status: AC

## 2022-07-04 NOTE — Telephone Encounter (Signed)
Chart reviewed  I pended an Rx for prednisone  Please verify pharmacy and send  She needs appointment VN or APP next available -

## 2022-07-04 NOTE — Telephone Encounter (Signed)
DOD This is a patient of Dr Woodward Ku who recently had stool studies done to r/o infectious cause of her diarrhea. She has UC and is on home infusions of Entyvio. The patient missed her appointment on 06/12/22 with Adrian Prince, PA to investigate her symptoms.  She called on 06/26/22 with the following complaints; passing blood and mucous with her stools 5 to 6 times a day. Complains of tenesmus. This has been occurring for "about 2 months." She had Entyvio last weeks. Endorses compliance with infusion. Patient had dental work last month and was given antibiotics then. She states " this was happening before then too."  Stool studies are negative. CBC shows increase of WBC at 11.5 otherwise normal. Cmet normal.  Spoke with the patient. Symptoms have persisted. She feels her UC is flaring. The plan had been to start steroids if studies were negative for infection. Please advise.

## 2022-07-04 NOTE — Telephone Encounter (Signed)
PT is calling to get lab results. Please advise. Thank you

## 2022-07-04 NOTE — Telephone Encounter (Signed)
Confirmed pharmacy as [facility]. Appointment 07/13/22 with Alonza Bogus, PA at 1:30 pm. Patient agrees to the appointment and plan of care.

## 2022-07-13 ENCOUNTER — Ambulatory Visit: Payer: Medicaid Other | Admitting: Gastroenterology

## 2022-08-16 ENCOUNTER — Ambulatory Visit: Payer: Medicaid Other | Admitting: Gastroenterology

## 2022-09-15 ENCOUNTER — Ambulatory Visit: Payer: Medicaid Other | Admitting: Nurse Practitioner

## 2022-09-15 NOTE — Progress Notes (Deleted)
09/15/2022 Joyce Solomon [id number] December 17, 1985   Chief Complaint:  History of Present Illness: Joyce Solomon is a 36 year old female with a past medical history of ulcerative colitis.  She is followed by Dr. Silverio Decamp.  Flare 07/04/2022, prescribed Prednisone   DOD This is a patient of Dr Woodward Ku who recently had stool studies done to r/o infectious cause of her diarrhea. She has UC and is on home infusions of Entyvio. The patient missed her appointment on 06/12/22 with Adrian Prince, PA to investigate her symptoms.  She called on 06/26/22 with the following complaints; passing blood and mucous with her stools 5 to 6 times a day. Complains of tenesmus. This has been occurring for "about 2 months." She had Entyvio last weeks. Endorses compliance with infusion. Patient had dental work last month and was given antibiotics then. She states " this was happening before then too."  Stool studies are negative. CBC shows increase of WBC at 11.5 otherwise normal. Cmet normal.  Spoke with the patient. Symptoms have persisted. She feels her UC is flaring. The plan had been to start steroids if studies were negative for infection.  Colonoscopy 12/26/2021: - Mucosal ulceration in distal rectum. Biopsied. - Decreased mucosa vascular pattern in the sigmoid colon, in the descending colon, in the transverse colon, in the ascending colon and in the cecum. - Non-bleeding internal hemorrhoids. - Await pathology results. - Use Canasa 1000 mg suppository 1 per rectum QHS for 2 weeks. - Return to GI office, next available appointment in 3-4 weeks with APP or Dr Silverio Decamp. -Recall colonoscopy 1 yearCHRONIC ACTIVE COLITIS/PROCTITIS WITH CRYPTITIS (SEE MICROSCOPIC COMMENT) NEGATIVE FOR DYSPLASIA AND GRANULOMAS      Latest Ref Rng & Units 06/26/2022    2:53 PM 06/09/2022   12:00 AM 02/06/2022    3:22 PM  CBC  WBC 4.0 - 10.5 K/uL 11.5  8.5  11.3   Hemoglobin 12.0 - 15.0 g/dL 13.6  13.5  13.6    Hematocrit 36.0 - 46.0 % 40.8  40.4  41.0   Platelets 150.0 - 400.0 K/uL 330.0  339  339.0        Latest Ref Rng & Units 06/26/2022    2:53 PM 06/09/2022   12:00 AM 02/06/2022    3:22 PM  CMP  Glucose 70 - 99 mg/dL 78  84  76   BUN 6 - 23 mg/dL 15  7  8    Creatinine 0.40 - 1.20 mg/dL 0.87  0.59  0.56   Sodium 135 - 145 mEq/L 140  139  139   Potassium 3.5 - 5.1 mEq/L 4.0  4.4  3.7   Chloride 96 - 112 mEq/L 103  106  107   CO2 19 - 32 mEq/L 23  19  25    Calcium 8.4 - 10.5 mg/dL 9.4  9.1  9.1   Total Protein 6.0 - 8.3 g/dL 7.7  7.1  7.1   Total Bilirubin 0.2 - 1.2 mg/dL 0.2  0.2  0.3   Alkaline Phos 39 - 117 U/L 69   63   AST 0 - 37 U/L 13  12  10    ALT 0 - 35 U/L 11  10  9      Relevant GI history:    She was in clinical remission on Humira, self discontinued after she became pregnant, restarted in Nov 2020 and she stopped it again after 3 months as she thought it was not helping   Colonoscopy Apr 10, 2018 negative for active colitis, chronic mucosal changes with altered vascularity and mild erythema.  Random biopsies negative for active inflammation.   Drug trough 5.9 with undetectable antibody for Humira     Colonoscopy June 2015 showed moderately severe left-sided colitis   Allergic reaction to Remicade in 2016.  Elevated antibody level to infliximab 1622 and undetectable drug level   Flexible sigmoidoscopy January 2017 showed severe colitis left colon with ulceration, biopsies showed moderately active colitis.   Started on Humira March 2017, and clinical remission since then    Current Medications, Allergies, Past Medical History, Past Surgical History, Family History and Social History were reviewed in [employer] record.   Review of Systems:   Constitutional: Negative for fever, sweats, chills or weight loss.  Respiratory: Negative for shortness of breath.   Cardiovascular: Negative for chest pain, palpitations and leg swelling.   Gastrointestinal: See HPI.  Musculoskeletal: Negative for back pain or muscle aches.  Neurological: Negative for dizziness, headaches or paresthesias.    Physical Exam: There were no vitals taken for this visit. General: Well developed, w   ***female in no acute distress. Head: Normocephalic and atraumatic. Eyes: No scleral icterus. Conjunctiva pink . Ears: Normal auditory acuity. Mouth: Dentition intact. No ulcers or lesions.  Lungs: Clear throughout to auscultation. Heart: Regular rate and rhythm, no murmur. Abdomen: Soft, nontender and nondistended. No masses or hepatomegaly. Normal bowel sounds x 4 quadrants.  Rectal: *** Musculoskeletal: Symmetrical with no gross deformities. Extremities: No edema. Neurological: Alert oriented x 4. No focal deficits.  Psychological: Alert and cooperative. Normal mood and affect  Assessment and Recommendations:  31) 36 year old female with left-sided ulcerative colitis on [facility]

## 2023-02-05 ENCOUNTER — Telehealth: Payer: Self-pay

## 2023-02-05 ENCOUNTER — Other Ambulatory Visit (HOSPITAL_COMMUNITY): Payer: Self-pay

## 2023-02-05 NOTE — Telephone Encounter (Signed)
PA renewal form received via CMM for Mesalamine 1.2GM dr tablets.  Per benefits investigation Brand Lialda is covered and preferred.   PA renewal not submitted at this time.   Key: [id number]

## 2023-02-08 ENCOUNTER — Telehealth: Payer: Self-pay

## 2023-02-08 NOTE — Telephone Encounter (Signed)
Spoke with the patient and scheduled an appointment for her 03/13/23 at 10:10 am. Mailed an appointment card to her per request. Confirmed her current address.

## 2023-02-13 ENCOUNTER — Other Ambulatory Visit: Payer: Self-pay | Admitting: Gastroenterology

## 2023-03-07 NOTE — Progress Notes (Signed)
Joyce Solomon    [id number]    08/09/1986  Primary Care Physician:Pa, [facility]  Referring Physician: Pa, [facility] [address] [place],  [place] [address]   Chief complaint: Ulcerative colitis Chief Complaint  Patient presents with   Ulcerative Colitis    Patient states she is doing well. Patient denies GI sx at this time.   HPI:37 year old very pleasant female here for follow up visit for ulcerative colitis.  She is on Entyvio infusions every 8 weeks. Last major ulcerative colitis flare was during her pregnancy in 2021 prior to initiation of Entyvio. She had a minor flare of symptoms intermittently, last episode in May 2022 that was treated with Prednisone taper   I last saw her on 02-06-22. At that time, she had an episode of abdominal pain and diarrhea associated with GI infection that has since resolved.    Today, she reports feeling well overall. She reports having 3-4x BM a day and her stool is formed but soft. She is on Entyvio every 2 months and Lialda 1.2 g. She denies taking any multivitamins.   Denies diarrhea, constipation, nausea, blood in stool, black stool, vomiting, abdominal pain, bloating, unintentional weight loss, reflux, dysphagia.   GI Hx: Colonoscopy 12/26/21 - Mucosal ulceration in distal rectum. Biopsied. - Decreased mucosa vascular pattern in the sigmoid colon, in the descending colon, in the transverse colon, in the ascending colon and in the cecum. - Non-bleeding internal hemorrhoids. Surgical [P], colon, rectum CHRONIC ACTIVE COLITIS/PROCTITIS WITH CRYPTITIS (SEE MICROSCOPIC COMMENT) NEGATIVE FOR DYSPLASIA AND GRANULOMAS  She was in clinical remission on Humira, self discontinued after she became pregnant, restarted in Nov 2020 and she stopped it again after 3 months as she thought it was not helping   Colonoscopy Apr 10, 2018 negative for active colitis, chronic mucosal changes with altered vascularity and mild  erythema.  Random biopsies negative for active inflammation.   Drug trough 5.9 with undetectable antibody for Humira   She was started on Humira March 2017   Colonoscopy June 2015 showed moderately severe left-sided colitis   Allergic reaction to Remicade in 2016.  Elevated antibody level to infliximab 1622 and undetectable drug level   Flexible sigmoidoscopy January 2017 showed severe colitis left colon with ulceration, biopsies showed moderately active colitis.    Current Outpatient Medications:    LIALDA 1.2 g EC tablet, TAKE 4 TABLETS BY MOUTH ONCE DAILY WITH BREAKFAST, Disp: 120 tablet, Rfl: 0   vedolizumab (ENTYVIO) 300 MG injection, Inject 300 mg into the vein every 8 (eight) weeks., Disp: 1 each, Rfl: 6   zolpidem (AMBIEN) 10 MG tablet, Take 10 mg by mouth at bedtime as needed., Disp: , Rfl:    Allergies as of 03/13/2023 - Review Complete 03/13/2023  Allergen Reaction Noted   Latex Rash 05/20/2009   Remicade [infliximab] Itching 08/07/2015    Past Medical History:  Diagnosis Date   Anxiety    Blood transfusion without reported diagnosis    Headache    Iron Deficiency Anemia 2008   s/p transfusions 2009, 2010; inconsistent compliance with PO iron.    Non compliance w medication regimen 06/2015   stopped Remicade, may not have been taking Azulfidine.   Seasonal allergies    Thyrotoxicosis    Ulcerative Colitis 2007   on Humira    Past Surgical History:  Procedure Laterality Date   CESAREAN SECTION N/A 12/09/2013   Procedure: Primary Cesarean Section Delivery Baby Girl @  9604, Apgars 8/8;  Surgeon: Oliver Pila, MD;  Location: [facility];  Service: Obstetrics;  Laterality: N/A;   CESAREAN SECTION N/A 08/14/2017   Procedure: CESAREAN SECTION;  Surgeon: Huel Cote, MD;  Location: [facility] BIRTHING SUITES;  Service: Obstetrics;  Laterality: N/A;   CESAREAN SECTION N/A 03/23/2020   Procedure: CESAREAN SECTION;  Surgeon: Tereso Newcomer, MD;  Location: [facility] LD ORS;   Service: Obstetrics;  Laterality: N/A;   COLONOSCOPY  01/19/2011   Dr Evette Cristal. no active UC   COLONOSCOPY  2019   KN-prep exc-   COLONOSCOPY W/ BIOPSIES N/A 04/2014   Dr Arlyce Dice: Left sided colitis from prox descending to rectum; no inflammation at or proximal to splenic flexure   DILATION AND EVACUATION  08/21/2012   Procedure: DILATATION AND EVACUATION;  Surgeon: Oliver Pila, MD;  Location: [facility];  Service: Gynecology   TONSILLECTOMY      Family History  Problem Relation Age of Onset   Miscarriages / Stillbirths Other    Diabetes Sister    Hypotension Neg Hx    Alcohol abuse Neg Hx    Arthritis Neg Hx    Asthma Neg Hx    Birth defects Neg Hx    COPD Neg Hx    Depression Neg Hx    Drug abuse Neg Hx    Hearing loss Neg Hx    Heart disease Neg Hx    Hyperlipidemia Neg Hx    Hypertension Neg Hx    Kidney disease Neg Hx    Learning disabilities Neg Hx    Mental illness Neg Hx    Mental retardation Neg Hx    Colon cancer Neg Hx    Colon polyps Neg Hx    Esophageal cancer Neg Hx    Stomach cancer Neg Hx    Rectal cancer Neg Hx     Social History   Socioeconomic History   Marital status: Single    Spouse name: Not on file   Number of children: 4   Years of education: Not on file   Highest education level: Not on file  Occupational History   Occupation: unemployed  Tobacco Use   Smoking status: Former    Years: 3    Types: Cigarettes    Quit date: 12/13/2018    Years since quitting: 4.2   Smokeless tobacco: Never   Tobacco comments:    smokes a cig "every blue moon"  Vaping Use   Vaping Use: Never used  Substance and Sexual Activity   Alcohol use: Not Currently    Comment: special occasions   Drug use: No   Sexual activity: Yes    Birth control/protection: None  Other Topics Concern   Not on file  Social History Narrative   Not on file   Social Determinants of Health   Financial Resource Strain: Not on file  Food Insecurity: Not on file   Transportation Needs: Not on file  Physical Activity: Not on file  Stress: Not on file  Social Connections: Not on file  Intimate Partner Violence: Not on file    Review of systems: Review of Systems  Constitutional:  Negative for unexpected weight change.  HENT:  Negative for trouble swallowing.   Cardiovascular:  Negative for leg swelling.  Gastrointestinal:  Negative for abdominal distention, abdominal pain, anal bleeding, blood in stool, constipation, diarrhea, nausea, rectal pain and vomiting.     Physical Exam: General: well-appearing   Eyes: sclera anicteric, no redness ENT: oral mucosa moist  without lesions, no cervical or supraclavicular lymphadenopathy CV: RRR, no JVD, no peripheral edema Resp: clear to auscultation bilaterally, normal RR and effort noted GI: soft, no tenderness, with active bowel sounds. No guarding or palpable organomegaly noted. Skin; warm and dry, no rash or jaundice noted Neuro: awake, alert and oriented x 3. Normal gross motor function and fluent speech   Data Reviewed:  Reviewed labs, radiology imaging, old records and pertinent past GI work up   A70ssessment and Plan/Recommendations:  37 year old very pleasant female with history of  ulcerative colitis pan colitis, iron deficiency anemia in clinical remission on Entyvio infusion every 8 weeks Continue Entyvio infusion and Lialda  Entyvio drug trough was therapeutic range with undetectable antibodies in 2023  Due for IBD health maintenance: Check CBC, CMP, vitamin D, iron panel, B12, folate, ESR, CRP and TB QuantiFERON gold Advised patient to start taking daily prenatal multivitamin  Return in 4 months   The patient was provided an opportunity to ask questions and all were answered. The patient agreed with the plan and demonstrated an understanding of the instructions.  Iona Beard , MD    CC: Pa, [facility]   I,Safa M Kadhim,acting as a scribe for Marsa Aris,  MD.,have documented all relevant documentation on the behalf of Marsa Aris, MD,as directed by  Marsa Aris, MD while in the presence of Marsa Aris, MD.   I, Marsa Aris, MD, have reviewed all documentation for this visit. The documentation on 03/13/23 for the exam, diagnosis, procedures, and orders are all accurate and complete.

## 2023-03-13 ENCOUNTER — Other Ambulatory Visit (INDEPENDENT_AMBULATORY_CARE_PROVIDER_SITE_OTHER): Payer: Medicaid Other

## 2023-03-13 ENCOUNTER — Encounter: Payer: Self-pay | Admitting: Gastroenterology

## 2023-03-13 ENCOUNTER — Ambulatory Visit (INDEPENDENT_AMBULATORY_CARE_PROVIDER_SITE_OTHER): Payer: Medicaid Other | Admitting: Gastroenterology

## 2023-03-13 VITALS — BP 106/57 | HR 68 | Ht 59.0 in | Wt 135.0 lb

## 2023-03-13 DIAGNOSIS — K5100 Ulcerative (chronic) pancolitis without complications: Secondary | ICD-10-CM

## 2023-03-13 LAB — CBC WITH DIFFERENTIAL/PLATELET
Basophils Absolute: 0 10*3/uL (ref 0.0–0.1)
Basophils Relative: 0.5 % (ref 0.0–3.0)
Eosinophils Absolute: 0.3 10*3/uL (ref 0.0–0.7)
Eosinophils Relative: 2.4 % (ref 0.0–5.0)
HCT: 37 % (ref 36.0–46.0)
Hemoglobin: 12.4 g/dL (ref 12.0–15.0)
Lymphocytes Relative: 34.9 % (ref 12.0–46.0)
Lymphs Abs: 3.6 10*3/uL (ref 0.7–4.0)
MCHC: 33.6 g/dL (ref 30.0–36.0)
MCV: 93.7 fl (ref 78.0–100.0)
Monocytes Absolute: 1 10*3/uL (ref 0.1–1.0)
Monocytes Relative: 9.9 % (ref 3.0–12.0)
Neutro Abs: 5.4 10*3/uL (ref 1.4–7.7)
Neutrophils Relative %: 52.3 % (ref 43.0–77.0)
Platelets: 368 10*3/uL (ref 150.0–400.0)
RBC: 3.94 Mil/uL (ref 3.87–5.11)
RDW: 14.7 % (ref 11.5–15.5)
WBC: 10.3 10*3/uL (ref 4.0–10.5)

## 2023-03-13 LAB — IBC + FERRITIN
Ferritin: 5.3 ng/mL — ABNORMAL LOW (ref 10.0–291.0)
Iron: 56 ug/dL (ref 42–145)
Saturation Ratios: 15.7 % — ABNORMAL LOW (ref 20.0–50.0)
TIBC: 355.6 ug/dL (ref 250.0–450.0)
Transferrin: 254 mg/dL (ref 212.0–360.0)

## 2023-03-13 LAB — COMPREHENSIVE METABOLIC PANEL
ALT: 14 U/L (ref 0–35)
AST: 11 U/L (ref 0–37)
Albumin: 3.8 g/dL (ref 3.5–5.2)
Alkaline Phosphatase: 63 U/L (ref 39–117)
BUN: 8 mg/dL (ref 6–23)
CO2: 27 mEq/L (ref 19–32)
Calcium: 8.5 mg/dL (ref 8.4–10.5)
Chloride: 107 mEq/L (ref 96–112)
Creatinine, Ser: 0.58 mg/dL (ref 0.40–1.20)
GFR: 115.69 mL/min (ref >60.00)
Glucose, Bld: 77 mg/dL (ref 70–99)
Potassium: 3.9 mEq/L (ref 3.5–5.1)
Sodium: 139 mEq/L (ref 135–145)
Total Bilirubin: 0.2 mg/dL (ref 0.2–1.2)
Total Protein: 6.5 g/dL (ref 6.0–8.3)

## 2023-03-13 LAB — B12 AND FOLATE PANEL
Folate: 5.7 ng/mL — ABNORMAL LOW (ref >5.9)
Vitamin B-12: 474 pg/mL (ref 211–911)

## 2023-03-13 LAB — HIGH SENSITIVITY CRP: CRP, High Sensitivity: 0.77 mg/L (ref 0.000–5.000)

## 2023-03-13 LAB — VITAMIN D 25 HYDROXY (VIT D DEFICIENCY, FRACTURES): VITD: 7.4 ng/mL — ABNORMAL LOW (ref 30.00–100.00)

## 2023-03-13 LAB — SEDIMENTATION RATE: Sed Rate: 20 mm/hr (ref 0–20)

## 2023-03-13 NOTE — Patient Instructions (Signed)
Your provider has requested that you go to the basement level for lab work before leaving today. Press "B" on the elevator. The lab is located at the first door on the left as you exit the elevator.   Continue Entyvio and Lialda  Use daily Prenatal Multivitamin  Due to recent changes in healthcare laws, you may see the results of your imaging and laboratory studies on MyChart before your provider has had a chance to review them.  We understand that in some cases there may be results that are confusing or concerning to you. Not all laboratory results come back in the same time frame and the provider may be waiting for multiple results in order to interpret others.  Please give [place] 48 hours in order for your provider to thoroughly review all the results before contacting the office for clarification of your results.    I appreciate the  opportunity to care for you  Thank You   Marsa Aris , MD

## 2023-03-14 ENCOUNTER — Telehealth: Payer: Self-pay | Admitting: Gastroenterology

## 2023-03-14 ENCOUNTER — Other Ambulatory Visit: Payer: Self-pay

## 2023-03-14 MED ORDER — ERGOCALCIFEROL 1.25 MG (50000 UT) PO CAPS: 50000.0000 [IU] | ORAL_CAPSULE | ORAL | 0 refills | Status: AC

## 2023-03-14 MED ORDER — FOLIC ACID 1 MG PO TABS: 1.0000 mg | ORAL_TABLET | Freq: Every day | ORAL | 11 refills | Status: DC

## 2023-03-14 NOTE — Telephone Encounter (Signed)
Spoke with the patient about her labs.

## 2023-03-14 NOTE — Telephone Encounter (Signed)
PT returning call to discuss lab results. Please advise. 

## 2023-03-15 LAB — QUANTIFERON-TB GOLD PLUS
Mitogen-NIL: >10 IU/mL
NIL: 0.03 IU/mL
QuantiFERON-TB Gold Plus: NEGATIVE
TB1-NIL: 0 IU/mL
TB2-NIL: 0 IU/mL

## 2023-05-23 ENCOUNTER — Other Ambulatory Visit: Payer: Self-pay | Admitting: Internal Medicine

## 2023-05-24 LAB — LIPID PANEL
Cholesterol: 135 mg/dL (ref <200)
HDL: 49 mg/dL — ABNORMAL LOW (ref >=50)
LDL Cholesterol (Calc): 76 mg/dL (calc)
Non-HDL Cholesterol (Calc): 86 mg/dL (calc) (ref <130)
Total CHOL/HDL Ratio: 2.8 (calc) (ref <5.0)
Triglycerides: 35 mg/dL (ref <150)

## 2023-05-24 LAB — COMPLETE METABOLIC PANEL WITH GFR
AG Ratio: 1.5 (calc) (ref 1.0–2.5)
ALT: 11 U/L (ref 6–29)
AST: 14 U/L (ref 10–30)
Albumin: 4.1 g/dL (ref 3.6–5.1)
Alkaline phosphatase (APISO): 63 U/L (ref 31–125)
BUN: 10 mg/dL (ref 7–25)
CO2: 24 mmol/L (ref 20–32)
Calcium: 9.5 mg/dL (ref 8.6–10.2)
Chloride: 105 mmol/L (ref 98–110)
Creat: 0.54 mg/dL (ref 0.50–0.97)
Globulin: 2.8 g/dL (calc) (ref 1.9–3.7)
Glucose, Bld: 73 mg/dL (ref 65–99)
Potassium: 3.7 mmol/L (ref 3.5–5.3)
Sodium: 139 mmol/L (ref 135–146)
Total Bilirubin: 0.4 mg/dL (ref 0.2–1.2)
Total Protein: 6.9 g/dL (ref 6.1–8.1)
eGFR: 122 mL/min/{1.73_m2} (ref >=60)

## 2023-05-24 LAB — VITAMIN B12: Vitamin B-12: 552 pg/mL (ref 200–1100)

## 2023-05-24 LAB — C. TRACHOMATIS/N. GONORRHOEAE RNA
C. trachomatis RNA, TMA: NOT DETECTED
N. gonorrhoeae RNA, TMA: NOT DETECTED

## 2023-05-24 LAB — CBC
HCT: 38.3 % (ref 35.0–45.0)
Hemoglobin: 12.6 g/dL (ref 11.7–15.5)
MCH: 31.3 pg (ref 27.0–33.0)
MCHC: 32.9 g/dL (ref 32.0–36.0)
MCV: 95.3 fL (ref 80.0–100.0)
MPV: 12.5 fL (ref 7.5–12.5)
Platelets: 259 10*3/uL (ref 140–400)
RBC: 4.02 10*6/uL (ref 3.80–5.10)
RDW: 12.8 % (ref 11.0–15.0)
WBC: 9.6 10*3/uL (ref 3.8–10.8)

## 2023-05-24 LAB — VITAMIN D 25 HYDROXY (VIT D DEFICIENCY, FRACTURES): Vit D, 25-Hydroxy: 28 ng/mL — ABNORMAL LOW (ref 30–100)

## 2023-05-24 LAB — FOLATE: Folate: 18.8 ng/mL

## 2023-07-18 ENCOUNTER — Ambulatory Visit: Payer: Medicaid Other | Admitting: Gastroenterology

## 2023-07-19 ENCOUNTER — Ambulatory Visit
Admission: EM | Admit: 2023-07-19 | Discharge: 2023-07-19 | Disposition: A | Discharge status: Home / Self Care | Payer: Medicaid Other | Attending: Emergency Medicine | Admitting: Emergency Medicine

## 2023-07-19 DIAGNOSIS — N898 Other specified noninflammatory disorders of vagina: Secondary | ICD-10-CM

## 2023-07-19 DIAGNOSIS — Z113 Encounter for screening for infections with a predominantly sexual mode of transmission: Secondary | ICD-10-CM | POA: Diagnosis not present

## 2023-07-19 LAB — POCT URINE PREGNANCY: Preg Test, Ur: NEGATIVE

## 2023-07-19 NOTE — Discharge Instructions (Signed)
Your tests are pending.  If your test results are positive, we will call you.  You and your sexual partner(s) may require treatment at that time.  Do not have sexual activity for at least 7 days.    Follow up with your primary care provider if your symptoms are not improving.

## 2023-07-19 NOTE — ED Triage Notes (Signed)
Patient to Urgent Care requesting STD testing w/ blood work. Unknown exposure.   Reports some vaginal irritation and discharge. Symptoms started last week.

## 2023-07-19 NOTE — ED Provider Notes (Signed)
Joyce Solomon    CSN: [id number] Arrival date & time: 07/19/23  0820      History   Chief Complaint Chief Complaint  Patient presents with   SEXUALLY TRANSMITTED DISEASE    HPI Joyce Solomon is a 37 y.o. female.  Patient presents with vaginal discharge x 1 week.  She requests STD testing.  She denies fever, rash, abdominal pain, dysuria, pelvic pain, or other symptoms.  No treatments at home.  Her medical history includes thyroid disease, anemia, ulcerative colitis.  The history is provided by the patient and medical records.    Past Medical History:  Diagnosis Date   Anxiety    Blood transfusion without reported diagnosis    Headache    Iron Deficiency Anemia 2008   s/p transfusions 2009, 2010; inconsistent compliance with PO iron.    Non compliance w medication regimen 06/2015   stopped Remicade, may not have been taking Azulfidine.   Seasonal allergies    Thyrotoxicosis    Ulcerative Colitis 2007   on Humira    Patient Active Problem List   Diagnosis Date Noted   Subclinical hyperthyroidism 08/12/2020   C. difficile diarrhea 03/24/2020   History of 2 cesarean sections 03/24/2020   Postpartum care following cesarean delivery 03/24/2020   Group B Streptococcus carrier, +RV culture, currently pregnant 03/14/2020   Supervision of high risk pregnancy, antepartum 09/09/2019   Anal fissure 09/11/2018   Rectal bleeding 04/23/2014   History of cesarean delivery affecting pregnancy 12/10/2013   Prior pregnancy complicated by SGA (small for gestational age), antepartum 12/09/2013   DENTAL CARIES 12/22/2009   Thyrotoxicosis 08/03/2008   Constipation 07/29/2008   ACNE, MILD 07/29/2008   Anemia 06/30/2008   Ulcerative colitis (HCC) 11/20/2005    Past Surgical History:  Procedure Laterality Date   CESAREAN SECTION N/A 12/09/2013   Procedure: Primary Cesarean Section Delivery Baby Girl @ 1959, Apgars 8/8;  Surgeon: Oliver Pila, MD;  Location: [facility];   Service: Obstetrics;  Laterality: N/A;   CESAREAN SECTION N/A 08/14/2017   Procedure: CESAREAN SECTION;  Surgeon: Huel Cote, MD;  Location: [facility] BIRTHING SUITES;  Service: Obstetrics;  Laterality: N/A;   CESAREAN SECTION N/A 03/23/2020   Procedure: CESAREAN SECTION;  Surgeon: Tereso Newcomer, MD;  Location: [facility] LD [facility];  Service: Obstetrics;  Laterality: N/A;   COLONOSCOPY  01/19/2011   Dr Evette Cristal. no active UC   COLONOSCOPY  2019   KN-prep exc-   COLONOSCOPY W/ BIOPSIES N/A 04/2014   Dr Arlyce Dice: Left sided colitis from prox descending to rectum; no inflammation at or proximal to splenic flexure   DILATION AND EVACUATION  08/21/2012   Procedure: DILATATION AND EVACUATION;  Surgeon: Oliver Pila, MD;  Location: [facility];  Service: Gynecology   TONSILLECTOMY      OB History     Gravida  6   Para  4   Term  4   Preterm      AB  2   Living  4      SAB  1   IAB  1   Ectopic      Multiple  0   Live Births  4            Home Medications    Prior to Admission medications   Medication Sig Start Date End Date Taking? Authorizing Provider  folic acid (FOLVITE) 1 MG tablet Take 1 tablet (1 mg total) by mouth daily. 03/14/23   Napoleon Form,  MD  LIALDA 1.2 g EC tablet TAKE 4 TABLETS BY MOUTH ONCE DAILY WITH BREAKFAST 02/13/23   Nandigam, Eleonore Chiquito, MD  vedolizumab (ENTYVIO) 300 MG injection Inject 300 mg into the vein every 8 (eight) weeks. 06/26/22   Napoleon Form, MD  zolpidem (AMBIEN) 10 MG tablet Take 10 mg by mouth at bedtime as needed. 11/21/21   [provider]    Family History Family History  Problem Relation Age of Onset   Miscarriages / Stillbirths Other    Diabetes Sister    Hypotension Neg Hx    Alcohol abuse Neg Hx    Arthritis Neg Hx    Asthma Neg Hx    Birth defects Neg Hx    COPD Neg Hx    Depression Neg Hx    Drug abuse Neg Hx    Hearing loss Neg Hx    Heart disease Neg Hx    Hyperlipidemia Neg Hx    Hypertension  Neg Hx    Kidney disease Neg Hx    Learning disabilities Neg Hx    Mental illness Neg Hx    Mental retardation Neg Hx    Colon cancer Neg Hx    Colon polyps Neg Hx    Esophageal cancer Neg Hx    Stomach cancer Neg Hx    Rectal cancer Neg Hx     Social History Social History   Tobacco Use   Smoking status: Former    Current packs/day: 0.00    Types: Cigarettes    Start date: 12/14/2015    Quit date: 12/13/2018    Years since quitting: 4.6   Smokeless tobacco: Never   Tobacco comments:    smokes a cig "every blue moon"  Vaping Use   Vaping status: Never Used  Substance Use Topics   Alcohol use: Not Currently    Comment: special occasions   Drug use: No     Allergies   Latex and Remicade [infliximab]   Review of Systems Review of Systems  Constitutional:  Negative for chills and fever.  Gastrointestinal:  Negative for abdominal pain, nausea and vomiting.  Genitourinary:  Positive for vaginal discharge. Negative for dysuria, flank pain, hematuria and pelvic pain.  Skin:  Negative for color change and rash.     Physical Exam Triage Vital Signs ED Triage Vitals  Encounter Vitals Group     BP      Systolic BP Percentile      Diastolic BP Percentile      Pulse      Resp      Temp      Temp src      SpO2      Weight      Height      Head Circumference      Peak Flow      Pain Score      Pain Loc      Pain Education      Exclude from Growth Chart    No data found.  Updated Vital Signs BP 111/60   Pulse 71   Temp 98 F (36.7 C)   Resp 18   LMP 07/02/2023   SpO2 98%   Visual Acuity Right Eye Distance:   Left Eye Distance:   Bilateral Distance:    Right Eye Near:   Left Eye Near:    Bilateral Near:     Physical Exam Vitals and nursing note reviewed.  Constitutional:      General: She  is not in acute distress.    Appearance: She is well-developed.  HENT:     Mouth/Throat:     Mouth: Mucous membranes are moist.  Cardiovascular:     Rate  and Rhythm: Normal rate and regular rhythm.     Heart sounds: Normal heart sounds.  Pulmonary:     Effort: Pulmonary effort is normal. No respiratory distress.     Breath sounds: Normal breath sounds.  Abdominal:     General: Bowel sounds are normal.     Palpations: Abdomen is soft.     Tenderness: There is no abdominal tenderness. There is no right CVA tenderness, left CVA tenderness, guarding or rebound.  Genitourinary:    Comments: Patient declines GU exam. Musculoskeletal:     Cervical back: Neck supple.  Skin:    General: Skin is warm and dry.  Neurological:     Mental Status: She is alert.      UC Treatments / Results  Labs (all labs ordered are listed, but only abnormal results are displayed) Labs Reviewed  RPR  HIV ANTIBODY (ROUTINE TESTING W REFLEX)  POCT URINE PREGNANCY  CERVICOVAGINAL ANCILLARY ONLY    EKG   Radiology No results found.  Procedures Procedures (including critical care time)  Medications Ordered in UC Medications - No data to display  Initial Impression / Assessment and Plan / UC Course  I have reviewed the triage vital signs and the nursing notes.  Pertinent labs & imaging results that were available during my care of the patient were reviewed by me and considered in my medical decision making (see chart for details).    Vaginal discharge, STD screening.  Patient obtained vaginal self swab for testing.  HIV and syphilis pending also.  Discussed that we will call if test results are positive.  Discussed that she may require treatment at that time.  Discussed that sexual partner(s) may also require treatment.  Instructed patient to abstain from sexual activity for at least 7 days.  Instructed her to follow-up with her PCP or gynecologist if her symptoms are not improving.  Patient agrees to plan of care.   Final Clinical Impressions(s) / UC Diagnoses   Final diagnoses:  Screening for STD (sexually transmitted disease)  Vaginal discharge      Discharge Instructions       Your tests are pending.  If your test results are positive, we will call you.  You and your sexual partner(s) may require treatment at that time.  Do not have sexual activity for at least 7 days.    Follow up with your primary care provider if your symptoms are not improving.          ED Prescriptions   None    PDMP not reviewed this encounter.   Mickie Bail, NP 07/19/23 [phone]

## 2023-07-20 ENCOUNTER — Telehealth: Payer: Self-pay

## 2023-07-20 ENCOUNTER — Encounter: Payer: Self-pay | Admitting: Physician Assistant

## 2023-07-20 ENCOUNTER — Ambulatory Visit: Payer: Medicaid Other | Admitting: Physician Assistant

## 2023-07-20 ENCOUNTER — Ambulatory Visit: Payer: Medicaid Other

## 2023-07-20 VITALS — BP 124/70 | HR 97 | Ht 59.0 in | Wt 127.0 lb

## 2023-07-20 DIAGNOSIS — R5382 Chronic fatigue, unspecified: Secondary | ICD-10-CM

## 2023-07-20 DIAGNOSIS — E559 Vitamin D deficiency, unspecified: Secondary | ICD-10-CM

## 2023-07-20 DIAGNOSIS — D500 Iron deficiency anemia secondary to blood loss (chronic): Secondary | ICD-10-CM

## 2023-07-20 DIAGNOSIS — E538 Deficiency of other specified B group vitamins: Secondary | ICD-10-CM

## 2023-07-20 DIAGNOSIS — K5100 Ulcerative (chronic) pancolitis without complications: Secondary | ICD-10-CM | POA: Diagnosis not present

## 2023-07-20 LAB — HIV ANTIBODY (ROUTINE TESTING W REFLEX): HIV Screen 4th Generation wRfx: NONREACTIVE

## 2023-07-20 LAB — IBC + FERRITIN
Ferritin: 9 ng/mL — ABNORMAL LOW (ref 10.0–291.0)
Iron: 96 ug/dL (ref 42–145)
Saturation Ratios: 24.8 % (ref 20.0–50.0)
TIBC: 386.4 ug/dL (ref 250.0–450.0)
Transferrin: 276 mg/dL (ref 212.0–360.0)

## 2023-07-20 LAB — RPR: RPR Ser Ql: NONREACTIVE

## 2023-07-20 LAB — SEDIMENTATION RATE: Sed Rate: 32 mm/hr — ABNORMAL HIGH (ref 0–20)

## 2023-07-20 LAB — C-REACTIVE PROTEIN: CRP: <1 mg/dL (ref 0.5–20.0)

## 2023-07-20 LAB — VITAMIN D 25 HYDROXY (VIT D DEFICIENCY, FRACTURES): VITD: 11.74 ng/mL — ABNORMAL LOW (ref 30.00–100.00)

## 2023-07-20 LAB — B12 AND FOLATE PANEL
Folate: 12.5 ng/mL (ref >5.9)
Vitamin B-12: 517 pg/mL (ref 211–911)

## 2023-07-20 MED ORDER — LIALDA 1.2 G PO TBEC: 1.2000 g | DELAYED_RELEASE_TABLET | Freq: Every day | ORAL | 5 refills | Status: DC

## 2023-07-20 MED ORDER — METRONIDAZOLE 500 MG PO TABS: 500.0000 mg | ORAL_TABLET | Freq: Two times a day (BID) | ORAL | 0 refills | Status: AC

## 2023-07-20 MED ORDER — LIALDA 1.2 G PO TBEC: 4.8000 g | DELAYED_RELEASE_TABLET | Freq: Every day | ORAL | 5 refills | Status: DC

## 2023-07-20 NOTE — Patient Instructions (Addendum)
Your provider has requested that you go to the basement level for lab work before leaving today. Press "B" on the elevator. The lab is located at the first door on the left as you exit the elevator.  We have sent the following medications to your pharmacy for you to pick up at your convenience: Lialda 1.2 g daily.   _______________________________________________________  If your blood pressure at your visit was 140/90 or greater, please contact your primary care physician to follow up on this.  _______________________________________________________  If you are age 26 or older, your body mass index should be between 23-30. Your Body mass index is 25.65 kg/m. If this is out of the aforementioned range listed, please consider follow up with your Primary Care Provider.  If you are age 76 or younger, your body mass index should be between 19-25. Your Body mass index is 25.65 kg/m. If this is out of the aformentioned range listed, please consider follow up with your Primary Care Provider.   ________________________________________________________  The [facility] providers would like to encourage you to use [facility] to communicate with providers for non-urgent requests or questions.  Due to long hold times on the telephone, sending your provider a message by [facility] may be a faster and more efficient way to get a response.  Please allow 48 business hours for a response.  Please remember that this is for non-urgent requests.  _______________________________________________________

## 2023-07-20 NOTE — Progress Notes (Signed)
Chief Complaint: Follow-up UC  HPI:    Joyce Solomon is a 37 year old African-American female with a past medical history as listed below including ulcerative colitis on Entyvio, known to Dr. Lavon Paganini, who presents to clinic today for follow-up of her ulcerative colitis.     03/13/2023 patient seen in clinic by Dr. Lavon Paganini, at that time on Entyvio infusion every 8 weeks.  Last flare noted during pregnancy in 2021 prior to initiation of Entyvio.  Also minor flare in May 2022 treated with a Prednisone taper.  At that visit was doing very well.  At that visit continued on Entyvio infusion and Lialda.  Noted Entyvio drug trough was therapeutic range with undetectable antibodies in 2023.  She was due for IBD health maintenance, CBC, CMP, vitamin D, iron panel, B12, folate, ESR, CRP and TB Quant.  Also recommended to take a daily prenatal vitamin.    03/13/2023 labs showed a low vitamin D, also low folate and she was started on folic acid and vitamin D.    Today, patient presents to clinic and tells me she is doing well other than feeling very fatigued like she just cannot stay awake or put 1 foot in front of the other.  Apparently stopped her Folate and Vitamin D "a while ago", because she just was not sure how long she needed to stay on them.  Also is not currently taking Lialda, was not sure she needed to stay on that either.  Otherwise no abdominal pain, normal bowel movements, no blood in her stool.    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    Denies fever, chills or weight loss.   GI Hx: Colonoscopy 12/26/21 - Mucosal ulceration in distal rectum. Biopsied. - Decreased mucosa vascular pattern in the sigmoid colon, in the descending colon, in the transverse colon, in the ascending colon and in the cecum. - Non-bleeding internal hemorrhoids. Surgical [P], colon, rectum CHRONIC ACTIVE COLITIS/PROCTITIS WITH CRYPTITIS  (SEE MICROSCOPIC COMMENT) NEGATIVE FOR DYSPLASIA AND GRANULOMAS   She was in clinical remission on Humira, self discontinued after she became pregnant, restarted in Nov 2020 and she stopped it again after 3 months as she thought it was not helping   Colonoscopy Apr 10, 2018 negative for active colitis, chronic mucosal changes with altered vascularity and mild erythema.  Random biopsies negative for active inflammation.   Drug trough 5.9 with undetectable antibody for Humira   She was started on Humira March 2017   Colonoscopy June 2015 showed moderately severe left-sided colitis   Allergic reaction to Remicade in 2016.  Elevated antibody level to infliximab 1622 and undetectable drug level   Flexible sigmoidoscopy January 2017 showed severe colitis left colon with ulceration, biopsies showed moderately active colitis.    Past Medical History:  Diagnosis Date   Anxiety    Blood transfusion without reported diagnosis    Headache    Iron Deficiency Anemia 2008   s/p transfusions 2009, 2010; inconsistent compliance with PO iron.    Non compliance w medication regimen 06/2015   stopped Remicade, may not have been taking Azulfidine.   Seasonal allergies    Thyrotoxicosis    Ulcerative Colitis 2007   on Humira    Past Surgical History:  Procedure Laterality Date   CESAREAN SECTION N/A 12/09/2013   Procedure: Primary Cesarean Section Delivery Baby Girl @ 1959, Apgars 8/8;  Surgeon: Olegario Messier  Rickey Primus, MD;  Location: [facility];  Service: Obstetrics;  Laterality: N/A;   CESAREAN SECTION N/A 08/14/2017   Procedure: CESAREAN SECTION;  Surgeon: Huel Cote, MD;  Location: [facility];  Service: Obstetrics;  Laterality: N/A;   CESAREAN SECTION N/A 03/23/2020   Procedure: CESAREAN SECTION;  Surgeon: Tereso Newcomer, MD;  Location: [facility] LD ORS;  Service: Obstetrics;  Laterality: N/A;   COLONOSCOPY  01/19/2011   Dr Evette Cristal. no active UC   COLONOSCOPY  2019   KN-prep exc-    COLONOSCOPY W/ BIOPSIES N/A 04/2014   Dr Arlyce Dice: Left sided colitis from prox descending to rectum; no inflammation at or proximal to splenic flexure   DILATION AND EVACUATION  08/21/2012   Procedure: DILATATION AND EVACUATION;  Surgeon: Oliver Pila, MD;  Location: [facility];  Service: Gynecology   TONSILLECTOMY      Current Outpatient Medications  Medication Sig Dispense Refill   folic acid (FOLVITE) 1 MG tablet Take 1 tablet (1 mg total) by mouth daily. 30 tablet 11   LIALDA 1.2 g EC tablet TAKE 4 TABLETS BY MOUTH ONCE DAILY WITH BREAKFAST 120 tablet 0   metroNIDAZOLE (FLAGYL) 500 MG tablet Take 1 tablet (500 mg total) by mouth 2 (two) times daily for 7 days. 14 tablet 0   vedolizumab (ENTYVIO) 300 MG injection Inject 300 mg into the vein every 8 (eight) weeks. 1 each 6   zolpidem (AMBIEN) 10 MG tablet Take 10 mg by mouth at bedtime as needed.     No current facility-administered medications for this visit.    Allergies as of 07/20/2023 - Review Complete 07/19/2023  Allergen Reaction Noted   Latex Rash 05/20/2009   Remicade [infliximab] Itching 08/07/2015    Family History  Problem Relation Age of Onset   Miscarriages / Stillbirths Other    Diabetes Sister    Hypotension Neg Hx    Alcohol abuse Neg Hx    Arthritis Neg Hx    Asthma Neg Hx    Birth defects Neg Hx    COPD Neg Hx    Depression Neg Hx    Drug abuse Neg Hx    Hearing loss Neg Hx    Heart disease Neg Hx    Hyperlipidemia Neg Hx    Hypertension Neg Hx    Kidney disease Neg Hx    Learning disabilities Neg Hx    Mental illness Neg Hx    Mental retardation Neg Hx    Colon cancer Neg Hx    Colon polyps Neg Hx    Esophageal cancer Neg Hx    Stomach cancer Neg Hx    Rectal cancer Neg Hx     Social History   Socioeconomic History   Marital status: Single    Spouse name: Not on file   Number of children: 4   Years of education: Not on file   Highest education level: Not on file  Occupational History    Occupation: unemployed  Tobacco Use   Smoking status: Former    Current packs/day: 0.00    Types: Cigarettes    Start date: 12/14/2015    Quit date: 12/13/2018    Years since quitting: 4.6   Smokeless tobacco: Never   Tobacco comments:    smokes a cig "every blue moon"  Vaping Use   Vaping status: Never Used  Substance and Sexual Activity   Alcohol use: Not Currently    Comment: special occasions   Drug use: No  Sexual activity: Yes    Birth control/protection: None  Other Topics Concern   Not on file  Social History Narrative   Not on file   Social Determinants of Health   Financial Resource Strain: Not on file  Food Insecurity: Not on file  Transportation Needs: Not on file  Physical Activity: Not on file  Stress: Not on file  Social Connections: Not on file  Intimate Partner Violence: Not on file    Review of Systems:    Constitutional: No weight loss, fever or chills Cardiovascular: No chest pain Respiratory: No SOB  Gastrointestinal: See HPI and otherwise negative   Physical Exam:  Vital signs: BP 124/70   Pulse 97   Ht 4\' 11"  (1.499 m)   Wt 127 lb (57.6 kg)   LMP 07/02/2023   BMI 25.65 kg/m    Constitutional:   Pleasant AA female appears to be in NAD, Well developed, Well nourished, alert and cooperative Respiratory: Respirations even and unlabored. Lungs clear to auscultation bilaterally.   No wheezes, crackles, or rhonchi.  Cardiovascular: Normal S1, S2. No MRG. Regular rate and rhythm. No peripheral edema, cyanosis or pallor.  Gastrointestinal:  Soft, nondistended, nontender. No rebound or guarding. Normal bowel sounds. No appreciable masses or hepatomegaly. Rectal:  Not performed.  Psychiatric:  Demonstrates good judgement and reason without abnormal affect or behaviors.  RELEVANT LABS AND IMAGING: CBC    Component Value Date/Time   WBC 9.6 05/23/2023 0000   RBC 4.02 05/23/2023 0000   HGB 12.6 05/23/2023 0000   HGB 11.4 01/20/2020 1004    HCT 38.3 05/23/2023 0000   HCT 32.8 (L) 01/20/2020 1004   PLT 259 05/23/2023 0000   PLT 262 01/20/2020 1004   MCV 95.3 05/23/2023 0000   MCV 90 01/20/2020 1004   MCV 89 11/17/2013 1715   MCH 31.3 05/23/2023 0000   MCHC 32.9 05/23/2023 0000   RDW 12.8 05/23/2023 0000   RDW 12.8 01/20/2020 1004   RDW 13.8 11/17/2013 1715   LYMPHSABS 3.6 03/13/2023 1049   LYMPHSABS 2.3 09/17/2019 1609   LYMPHSABS 0.5 (L) 11/17/2013 1715   MONOABS 1.0 03/13/2023 1049   MONOABS 0.6 11/17/2013 1715   EOSABS 0.3 03/13/2023 1049   EOSABS 0.2 09/17/2019 1609   EOSABS 0.0 11/17/2013 1715   BASOSABS 0.0 03/13/2023 1049   BASOSABS 0.0 09/17/2019 1609   BASOSABS 0.0 11/17/2013 1715    CMP     Component Value Date/Time   NA 139 05/23/2023 0000   NA 137 11/17/2013 1715   K 3.7 05/23/2023 0000   K 3.7 11/17/2013 1715   CL 105 05/23/2023 0000   CL 107 11/17/2013 1715   CO2 24 05/23/2023 0000   CO2 19 (L) 11/17/2013 1715   GLUCOSE 73 05/23/2023 0000   GLUCOSE 90 11/17/2013 1715   BUN 10 05/23/2023 0000   BUN 5 (L) 11/17/2013 1715   CREATININE 0.54 05/23/2023 0000   CALCIUM 9.5 05/23/2023 0000   CALCIUM 8.9 11/17/2013 1715   PROT 6.9 05/23/2023 0000   ALBUMIN 3.8 03/13/2023 1049   AST 14 05/23/2023 0000   ALT 11 05/23/2023 0000   ALKPHOS 63 03/13/2023 1049   BILITOT 0.4 05/23/2023 0000   GFRNONAA >60 08/06/2020 0809   GFRNONAA >60 11/17/2013 1715   GFRAA >60 08/06/2020 0809   GFRAA >60 11/17/2013 1715    Assessment: 1.  Ulcerative pancolitis: With iron deficiency anemia clinical remission on Entyvio infusions every 8 weeks and Lialda 1.2 g daily 2.  Vitamin D and folate deficiency and iron deficiency anemia 3.  Fatigue: Suspect this could be related to above but she has stopped her supplementation  Plan: 1.  Recheck vitamin D, iron panel and B12/folate as well as ESR and CRP today she has not been on Lialda 2.  Refill Lialda 1.2 g daily #90 with 3 refills 3.  Patient is up-to-date with IBD  maintenance, but will recheck vitamin D and B12 as above 4.  Next colonoscopy likely due next year in February as this will be 2 years out from her last 5.  Continue Entyvio infusions 6.  Continue Prenatal vitamins for iron deficiency anemia 7.  Patient to follow in clinic with Dr. Lavon Paganini in 4 to 6 months or sooner if necessary.  Joyce Meeker, PA-C [facility] 07/20/2023, 2:44 PM  Cc: Joyce Contras, MD

## 2023-07-24 ENCOUNTER — Other Ambulatory Visit: Payer: Self-pay | Admitting: *Deleted

## 2023-07-25 ENCOUNTER — Other Ambulatory Visit: Payer: Self-pay | Admitting: *Deleted

## 2023-07-25 ENCOUNTER — Telehealth: Payer: Self-pay | Admitting: *Deleted

## 2023-07-25 DIAGNOSIS — R5382 Chronic fatigue, unspecified: Secondary | ICD-10-CM

## 2023-07-25 MED ORDER — VITAMIN D (ERGOCALCIFEROL) 1.25 MG (50000 UNIT) PO CAPS
50000.0000 [IU] | ORAL_CAPSULE | ORAL | 0 refills | Status: AC
Start: 2023-07-25 — End: 2023-09-13

## 2023-07-25 MED ORDER — FERROUS SULFATE 325 (65 FE) MG PO TABS
325.0000 mg | ORAL_TABLET | Freq: Three times a day (TID) | ORAL | Status: AC
Start: 2023-07-25 — End: 2023-08-24

## 2023-07-25 MED ORDER — FERROUS FUMARATE 150 MG PO TABS: 150.0000 mg | ORAL_TABLET | Freq: Three times a day (TID) | ORAL | 6 refills | Status: DC

## 2023-07-25 NOTE — Telephone Encounter (Signed)
Ferrous Sulfate ordered 325 mg 3x's/d with meals per [facility]. Patient was also ordered Vitamin D2 1.25mg  capsule once a week

## 2023-07-25 NOTE — Telephone Encounter (Signed)
-----   Message from Unk Lightning sent at 07/25/2023  3:34 PM EDT ----- Regarding: ferrous sulfate Ferrous sulfate-thanks-JLL ----- Message ----- From: Avanell Shackleton, RN Sent: 07/25/2023  11:22 AM EDT To: Unk Lightning, PA  Pharmacy called to say they do not have the Ferrous Fumarate at 150mg , as ordered previously via Dr. Lavon Paganini. They have Ferrous Fumarate 324mg  and they have Ferrous Sulfate 325mg . Which shall I order?

## 2023-07-26 ENCOUNTER — Telehealth: Payer: Self-pay | Admitting: *Deleted

## 2023-07-26 LAB — CERVICOVAGINAL ANCILLARY ONLY
Bacterial Vaginitis (gardnerella): POSITIVE — AB
Candida Glabrata: NEGATIVE
Candida Vaginitis: NEGATIVE
Chlamydia: NEGATIVE
Comment: NEGATIVE
Comment: NEGATIVE
Comment: NEGATIVE
Comment: NEGATIVE
Comment: NEGATIVE
Comment: NORMAL
Neisseria Gonorrhea: NEGATIVE
Trichomonas: NEGATIVE

## 2023-07-26 NOTE — Telephone Encounter (Signed)
This encounter has been addressed.

## 2023-07-26 NOTE — Telephone Encounter (Signed)
-----   Message from Unk Lightning sent at 07/25/2023  3:34 PM EDT ----- Regarding: ferrous sulfate Ferrous sulfate-thanks-JLL ----- Message ----- From: Avanell Shackleton, RN Sent: 07/25/2023  11:22 AM EDT To: Unk Lightning, PA  Pharmacy called to say they do not have the Ferrous Fumarate at 150mg , as ordered previously via Dr. Lavon Paganini. They have Ferrous Fumarate 324mg  and they have Ferrous Sulfate 325mg . Which shall I order?

## 2023-07-26 NOTE — Telephone Encounter (Signed)
This encounter has been addressed. Ms. Hyacinth Meeker, PA ordered the Ferrous Sulfate 325 mg.

## 2023-07-31 ENCOUNTER — Telehealth: Payer: Self-pay | Admitting: Physician Assistant

## 2023-07-31 NOTE — Telephone Encounter (Signed)
Beth, did you happen to get refill request form for Entyvio since she is Dr. Elana Alm patient? I have not received anything. Thanks

## 2023-07-31 NOTE — Telephone Encounter (Signed)
Inbound call from [facility] states they sent over order regarding patient's entyvio infusion and has not received anything back. States patient is due soon for infusion. Please advise, thank you.

## 2023-08-01 NOTE — Telephone Encounter (Signed)
Orders received for Entyvio infusion from [facility] received, signed and faxed back to [phone].

## 2023-10-02 ENCOUNTER — Telehealth: Payer: Self-pay | Admitting: Physician Assistant

## 2023-10-02 NOTE — Telephone Encounter (Signed)
Inbound call from [facility] regarding Entyvio prescription for patient. Requesting for a updated prescription to be sent to [phone]. Also requesting a call back at [phone]. Please advise, thank you.

## 2023-10-03 NOTE — Telephone Encounter (Signed)
Called patient of try to obtain information concerning as to whether or not she is continuing Home infusion or if she will be going to a infusion center? [facility] called [facility] to ask if the patient is supposed to receive medications at an infusion center as well as supplies? Called the patient to clarify information about her medication, supplies and where to send those things (home or infusion center). It seems that [facility] is now the pharmacy handling this case instead of [facility]. Will continue to contact the patient; no answer at this time, left message. Also lmtcb.

## 2023-10-04 ENCOUNTER — Telehealth: Payer: Self-pay | Admitting: Gastroenterology

## 2023-10-04 NOTE — Telephone Encounter (Signed)
Pharmacy rep called asking for a new prescription for the [facility]. Pharmacy stated they faxed over a request. Best call back number for them is [phone]. Please advise

## 2023-10-04 NOTE — Telephone Encounter (Signed)
I have faxed back all the forms faxed to [place]. I will call tomorrow and make certain everything is taken care of.

## 2023-10-04 NOTE — Telephone Encounter (Signed)
You may have done this but this is what I was asking about the prescription

## 2023-10-05 NOTE — Telephone Encounter (Signed)
Patient called in reference to whether she is still receiving medications from [facility] or [facility]? And if so where do they deliver the medications as well as the supplies? [facility] needs to be notified of the medication Thompson Grayer) if there has been a change in the pharmacy. Called the patient twice this week, yesterday and today without any response. Message left on the VM x 2.

## 2023-10-08 ENCOUNTER — Other Ambulatory Visit: Payer: Self-pay

## 2023-10-08 MED ORDER — ENTYVIO 300 MG IV SOLR: INTRAVENOUS | 6 refills | Status: AC

## 2023-10-08 NOTE — Telephone Encounter (Signed)
Called patient several times this week trying to contact in reference to her medication Entyvio and the pharmacy in which she is receiving the medication. Was called by a [facility] pharmacy that states they are presently filling her medication prescriptions, although have none of the required information for filling or delivering the medication. Unable to contact the patient to verify this information. Spoke to [employer], LPN, and was informed that she had taken care of the pharmaceutical information and the patient is still dealing with [facility] and not [facility].

## 2023-10-08 NOTE — Telephone Encounter (Signed)
Inbound call from patient's pharmacy requesting for the quantity and amount of refills for Entyvio medication to be sent to [phone]. Please advise, thank you

## 2023-10-08 NOTE — Telephone Encounter (Signed)
Received call from patient and called back with no response. The situation concerning the patient's medications Entyvio  and the pharmacy handling the medication infusion was taken care of via Ms. Arlyss Queen, LPN on 96/04/54. Left message for the patient to call Patty if she has any questions at this time.

## 2023-10-08 NOTE — Telephone Encounter (Signed)
Inbound call from patient returning phone call. Requesting a call back. Please advise, thank you.  

## 2023-10-08 NOTE — Telephone Encounter (Signed)
Called the number provided by the caller. This is not the pharmacy for specialty medications. [facility] [place], [place] - [address] [phone]  Called [phone]. This is a fax number. Prescription faxed to the pharmacy.

## 2023-11-16 ENCOUNTER — Other Ambulatory Visit: Payer: Self-pay

## 2023-11-16 ENCOUNTER — Encounter (HOSPITAL_COMMUNITY): Payer: Self-pay

## 2023-11-16 ENCOUNTER — Emergency Department (HOSPITAL_COMMUNITY)
Admission: EM | Admit: 2023-11-16 | Discharge: 2023-11-16 | Discharge status: Against Medical Advice | Payer: Medicaid Other | Attending: Emergency Medicine | Admitting: Emergency Medicine

## 2023-11-16 DIAGNOSIS — Z5321 Procedure and treatment not carried out due to patient leaving prior to being seen by health care provider: Secondary | ICD-10-CM | POA: Diagnosis not present

## 2023-11-16 DIAGNOSIS — R1031 Right lower quadrant pain: Secondary | ICD-10-CM | POA: Diagnosis present

## 2023-11-16 LAB — URINALYSIS, ROUTINE W REFLEX MICROSCOPIC
Bacteria, UA: NONE SEEN
Bilirubin Urine: NEGATIVE
Glucose, UA: NEGATIVE mg/dL
Ketones, ur: NEGATIVE mg/dL
Leukocytes,Ua: NEGATIVE
Nitrite: NEGATIVE
Protein, ur: 30 mg/dL — AB
Specific Gravity, Urine: 1.034 — ABNORMAL HIGH (ref 1.005–1.030)
pH: 6 (ref 5.0–8.0)

## 2023-11-16 LAB — COMPREHENSIVE METABOLIC PANEL
ALT: 12 U/L (ref 0–44)
AST: 12 U/L — ABNORMAL LOW (ref 15–41)
Albumin: 4.3 g/dL (ref 3.5–5.0)
Alkaline Phosphatase: 67 U/L (ref 38–126)
Anion gap: 5 (ref 5–15)
BUN: 16 mg/dL (ref 6–20)
CO2: 24 mmol/L (ref 22–32)
Calcium: 8.9 mg/dL (ref 8.9–10.3)
Chloride: 107 mmol/L (ref 98–111)
Creatinine, Ser: 0.51 mg/dL (ref 0.44–1.00)
GFR, Estimated: >60 mL/min (ref >60)
Glucose, Bld: 103 mg/dL — ABNORMAL HIGH (ref 70–99)
Potassium: 4.1 mmol/L (ref 3.5–5.1)
Sodium: 136 mmol/L (ref 135–145)
Total Bilirubin: 0.3 mg/dL (ref <1.2)
Total Protein: 8 g/dL (ref 6.5–8.1)

## 2023-11-16 LAB — CBC
HCT: 43.1 % (ref 36.0–46.0)
Hemoglobin: 13.8 g/dL (ref 12.0–15.0)
MCH: 31 pg (ref 26.0–34.0)
MCHC: 32 g/dL (ref 30.0–36.0)
MCV: 96.9 fL (ref 80.0–100.0)
Platelets: 336 10*3/uL (ref 150–400)
RBC: 4.45 MIL/uL (ref 3.87–5.11)
RDW: 14.1 % (ref 11.5–15.5)
WBC: 15.6 10*3/uL — ABNORMAL HIGH (ref 4.0–10.5)
nRBC: 0 % (ref 0.0–0.2)

## 2023-11-16 LAB — LIPASE, BLOOD: Lipase: 47 U/L (ref 11–51)

## 2023-11-16 LAB — HCG, SERUM, QUALITATIVE: Preg, Serum: NEGATIVE

## 2023-11-16 NOTE — ED Triage Notes (Signed)
Pt to ED by POV from home with c/o RLQ abdominal pain which began this afternoon, described as sharp in nature, denies any NVD. Arrives A+O, VSS, NADN.

## 2023-12-11 ENCOUNTER — Telehealth: Payer: Self-pay | Admitting: Gastroenterology

## 2023-12-11 NOTE — Telephone Encounter (Signed)
Joyce Solomon I spoke to this pharmacy they are saying the Epipen prior Berkley Harvey is needed and its holding up her Entyvio shipment. So Im confused I can't find that we prescribed EpiPen . Do you know anything about that?

## 2023-12-11 NOTE — Telephone Encounter (Signed)
Ok, thanks.

## 2023-12-11 NOTE — Telephone Encounter (Signed)
Noted. Thanks.

## 2023-12-11 NOTE — Telephone Encounter (Signed)
I called the pharmacy and cancelled the epi-pen order. I have asked the pharmacy to dispense the epinephrine ampul 1 mg/ml-SIG 0.69ml (0.3mg ) IM PRN moderate to severe infusion reaction.  Pharmacist will change the order.

## 2023-12-11 NOTE — Telephone Encounter (Signed)
Patients Epi-pen is not prescribed by Dr Lavon Paganini in hx of meds it was prescribed by her PCP.

## 2023-12-11 NOTE — Telephone Encounter (Signed)
[facility] called and stated that they need a Prior Authorization for this patient Epi pen. [facility] number is [phone]. Please advise.

## 2023-12-12 ENCOUNTER — Other Ambulatory Visit: Payer: Self-pay

## 2023-12-12 NOTE — Progress Notes (Deleted)
Epinephrine ampul will require prior authorization as well per pharmacy. Received a phone call about this. I have asked the pharmacist to cancel the epinephrine prescription to avoid further delay on the [facility] shipment.  Reaching out to the infusion team to inquire about options.

## 2023-12-15 ENCOUNTER — Telehealth: Payer: Self-pay | Admitting: Pharmacy Technician

## 2023-12-15 ENCOUNTER — Other Ambulatory Visit (HOSPITAL_COMMUNITY): Payer: Self-pay

## 2023-12-15 NOTE — Telephone Encounter (Signed)
Pharmacy Patient Advocate Encounter   Received notification from CoverMyMeds that prior authorization for EPINEPHRINE 0.3MG  is required/requested.   Insurance verification completed.   The patient is insured through [facility] .   Per test claim: PA required; PA submitted to above mentioned insurance via CoverMyMeds Key/confirmation #/EOC [id number] Status is pending

## 2023-12-15 NOTE — Telephone Encounter (Signed)
Pharmacy Patient Advocate Encounter  Received notification from [facility] that Prior Authorization for EPINEPHRINE 0.3MG  has been APPROVED from 1.25.25 to 1.25.26. Ran test claim, Copay is $4. This test claim was processed through [facility]- copay amounts may vary at other pharmacies due to pharmacy/plan contracts, or as the patient moves through the different stages of their insurance plan.   PA #/Case ID/Reference #:[id number]

## 2024-02-07 ENCOUNTER — Ambulatory Visit: Admission: EM | Admit: 2024-02-07 | Discharge: 2024-02-07 | Disposition: A | Discharge status: Home / Self Care

## 2024-02-07 DIAGNOSIS — Z3A00 Weeks of gestation of pregnancy not specified: Secondary | ICD-10-CM

## 2024-02-07 DIAGNOSIS — O26819 Pregnancy related exhaustion and fatigue, unspecified trimester: Secondary | ICD-10-CM

## 2024-02-07 DIAGNOSIS — Z3201 Encounter for pregnancy test, result positive: Secondary | ICD-10-CM | POA: Diagnosis not present

## 2024-02-07 LAB — POCT URINALYSIS DIP (MANUAL ENTRY)
Bilirubin, UA: NEGATIVE
Blood, UA: NEGATIVE
Glucose, UA: NEGATIVE mg/dL
Ketones, POC UA: NEGATIVE mg/dL
Leukocytes, UA: NEGATIVE
Nitrite, UA: NEGATIVE
Protein Ur, POC: NEGATIVE mg/dL
Spec Grav, UA: 1.015 (ref 1.010–1.025)
Urobilinogen, UA: 0.2 U/dL
pH, UA: 7 (ref 5.0–8.0)

## 2024-02-07 LAB — POCT URINE PREGNANCY: Preg Test, Ur: POSITIVE — AB

## 2024-02-07 NOTE — ED Triage Notes (Signed)
"  This started with ha, fatigue about 3 wks ago and some abd cramping now (that started last week), history of ulcer". No nausea or vomiting. No runny nose. No cough.

## 2024-02-07 NOTE — ED Provider Notes (Signed)
 [facility]    CSN: [id number] Arrival date & time: 02/07/24  1457      History   Chief Complaint Chief Complaint  Patient presents with   Headache   Nasal Congestion   Possible Pregnancy    HPI Joyce Solomon is a 38 y.o. female.   Patient presents today for evaluation of headaches and fatigue that started about 3 weeks ago.  She reports last week she also developed some mild abdominal cramping.  She denies any nausea or vomiting.  She has not any runny nose or cough.  She denies fever.  She is a few days late for her menstrual period.  The history is provided by the patient.  Headache Associated symptoms: fatigue   Associated symptoms: no abdominal pain, no fever, no nausea and no vomiting   Possible Pregnancy Associated symptoms include headaches. Pertinent negatives include no abdominal pain and no shortness of breath.    Past Medical History:  Diagnosis Date   Anxiety    Blood transfusion without reported diagnosis    Headache    Iron Deficiency Anemia 2008   s/p transfusions 2009, 2010; inconsistent compliance with PO iron.    Non compliance w medication regimen 06/2015   stopped Remicade, may not have been taking Azulfidine.   Seasonal allergies    Thyrotoxicosis    Ulcerative Colitis 2007   on Humira    Patient Active Problem List   Diagnosis Date Noted   Subclinical hyperthyroidism 08/12/2020   C. difficile diarrhea 03/24/2020   History of 2 cesarean sections 03/24/2020   Postpartum care following cesarean delivery 03/24/2020   Group B Streptococcus carrier, +RV culture, currently pregnant 03/14/2020   Supervision of high risk pregnancy, antepartum 09/09/2019   Anal fissure 09/11/2018   Rectal bleeding 04/23/2014   History of cesarean delivery affecting pregnancy 12/10/2013   Prior pregnancy complicated by SGA (small for gestational age), antepartum 12/09/2013   DENTAL CARIES 12/22/2009   Thyrotoxicosis 08/03/2008   Constipation  07/29/2008   ACNE, MILD 07/29/2008   Anemia 06/30/2008   Other ulcerative colitis without complications (HCC) 11/20/2005    Past Surgical History:  Procedure Laterality Date   CESAREAN SECTION N/A 12/09/2013   Procedure: Primary Cesarean Section Delivery Baby Girl @ 1959, Apgars 8/8;  Surgeon: Oliver Pila, MD;  Location: [facility];  Service: Obstetrics;  Laterality: N/A;   CESAREAN SECTION N/A 08/14/2017   Procedure: CESAREAN SECTION;  Surgeon: Huel Cote, MD;  Location: [facility]THING SUIT[facility];  Service: Obstetrics;  Laterality: N/A;   CESAREAN SECTION N/A 03/23/2020   Procedure: CESAREAN SECTION;  Surgeon: Tereso Newcomer, MD;  Location: [facility] LD ORS;  Service: Obstetrics;  Laterality: N/A;   COLONOSCOPY  01/19/2011   Dr Evette Cristal. no active UC   COLONOSCOPY  2019   KN-prep exc-   COLONOSCOPY W/ BIOPSIES N/A 04/2014   Dr Arlyce Dice: Left sided colitis from prox descending to rectum; no inflammation at or proximal to splenic flexure   DILATION AND EVACUATION  08/21/2012   Procedure: DILATATION AND EVACUATION;  Surgeon: Oliver Pila, MD;  Location: [facility];  Service: Gynecology   TONSILLECTOMY      OB History     Gravida  6   Para  4   Term  4   Preterm      AB  2   Living  4      SAB  1   IAB  1   Ectopic  Multiple  0   Live Births  4            Home Medications    Prior to Admission medications   Medication Sig Start Date End Date Taking? Authorizing Provider  diphenhydrAMINE (BENADRYL) 50 MG/ML injection Inject 50 mg into the muscle as directed. 08/01/23 07/26/24 Yes [provider]  sodium chloride 0.9 % SOLN Inject into the vein as needed (Infuse intravenously Once every eight weeks over 30 minutes. Add vedolizumab (Entyvio) 300 mg (5 mL). Mix gently prior to administration. Storage: Room Temperature. Medication must be added to this container. Added By: Date/Time).  Infuse intravenously Once every eight weeks over 30 minutes. Add  vedolizumab (Entyvio) 300 mg (5 mL). Mix gently prior to administration. Storage: Room Temperature. Medication must be added to this container. Added By: Date/Time 08/01/23 07/26/24 Yes [provider]  sodium chloride flush 0.9 % SOLN injection Inject 10 mLs into the vein as needed (Inject 10 mL as directed as needed for line care. Flush using the SAS (saline, administer IV, saline) method as directed. Use syringe one time only then discard syringe. *Saline is another name for Sodium Chloride 0.9%* Storage: Room Temperature.). 08/01/23 07/26/24 Yes [provider]  vedolizumab (ENTYVIO) 300 MG injection Administer as infusion over 30 minutes every 8 weeks 10/08/23  Yes Nandigam, Eleonore Chiquito, MD  Water For Injection Sterile (STERILE WATER, PRESERVATIVE FREE,) injection Use as directed for reconstituting vedolizumab (Entyvio) vials. Storage: Room Temperature. 08/01/23 07/26/24 Yes [provider]  Ferrous Fumarate 150 MG TABS Take 1 tablet (150 mg total) by mouth with breakfast, with lunch, and with evening meal. 07/25/23   Lemmon, Violet Baldy, PA  folic acid (FOLVITE) 1 MG tablet Take 1 tablet (1 mg total) by mouth daily. Patient not taking: Reported on 07/20/2023 03/14/23   Napoleon Form, MD  LIALDA 1.2 g EC tablet Take 1 tablet (1.2 g total) by mouth daily with breakfast. 07/20/23   Unk Lightning, PA  zolpidem (AMBIEN) 10 MG tablet Take 10 mg by mouth at bedtime as needed. 11/21/21   [provider]    Family History Family History  Problem Relation Age of Onset   Miscarriages / Stillbirths Other    Diabetes Sister    Hypotension Neg Hx    Alcohol abuse Neg Hx    Arthritis Neg Hx    Asthma Neg Hx    Birth defects Neg Hx    COPD Neg Hx    Depression Neg Hx    Drug abuse Neg Hx    Hearing loss Neg Hx    Heart disease Neg Hx    Hyperlipidemia Neg Hx    Hypertension Neg Hx    Kidney disease Neg Hx    Learning disabilities Neg Hx    Mental illness Neg  Hx    Mental retardation Neg Hx    Colon cancer Neg Hx    Colon polyps Neg Hx    Esophageal cancer Neg Hx    Stomach cancer Neg Hx    Rectal cancer Neg Hx     Social History Social History   Tobacco Use   Smoking status: Former    Current packs/day: 0.00    Types: Cigarettes    Start date: 12/14/2015    Quit date: 12/13/2018    Years since quitting: 5.1   Smokeless tobacco: Never   Tobacco comments:    smokes a cig "every blue moon"  Vaping Use   Vaping status: Never  Used  Substance Use Topics   Alcohol use: Not Currently    Comment: special occasions   Drug use: No     Allergies   Latex and Remicade [infliximab]   Review of Systems Review of Systems  Constitutional:  Positive for fatigue. Negative for chills and fever.  Eyes:  Negative for discharge and redness.  Respiratory:  Negative for shortness of breath.   Gastrointestinal:  Negative for abdominal pain, nausea and vomiting.  Neurological:  Positive for headaches.     Physical Exam Triage Vital Signs ED Triage Vitals  Encounter Vitals Group     BP 02/07/24 1511 111/70     Systolic BP Percentile --      Diastolic BP Percentile --      Pulse Rate 02/07/24 1511 84     Resp 02/07/24 1511 16     Temp 02/07/24 1511 98.6 F (37 C)     Temp Source 02/07/24 1511 Oral     SpO2 02/07/24 1511 98 %     Weight 02/07/24 1508 125 lb (56.7 kg)     Height 02/07/24 1508 4\' 11"  (1.499 m)     Head Circumference --      Peak Flow --      Pain Score 02/07/24 1504 2     Pain Loc --      Pain Education --      Exclude from Growth Chart --    No data found.  Updated Vital Signs BP 111/70 (BP Location: Left Arm)   Pulse 84   Temp 98.6 F (37 C) (Oral)   Resp 16   Ht 4\' 11"  (1.499 m)   Wt 125 lb (56.7 kg)   LMP 01/08/2024 (Exact Date)   SpO2 98%   BMI 25.25 kg/m   Visual Acuity Right Eye Distance:   Left Eye Distance:   Bilateral Distance:    Right Eye Near:   Left Eye Near:    Bilateral Near:      Physical Exam Vitals and nursing note reviewed.  Constitutional:      General: She is not in acute distress.    Appearance: Normal appearance. She is not ill-appearing.  HENT:     Head: Normocephalic and atraumatic.  Eyes:     Conjunctiva/sclera: Conjunctivae normal.  Cardiovascular:     Rate and Rhythm: Normal rate.  Pulmonary:     Effort: Pulmonary effort is normal. No respiratory distress.  Neurological:     Mental Status: She is alert.  Psychiatric:        Mood and Affect: Mood normal.        Behavior: Behavior normal.        Thought Content: Thought content normal.      UC Treatments / Results  Labs (all labs ordered are listed, but only abnormal results are displayed) Labs Reviewed  POCT URINALYSIS DIP (MANUAL ENTRY) - Abnormal; Notable for the following components:      Result Value   Color, UA light yellow (*)    Clarity, UA cloudy (*)    All other components within normal limits  POCT URINE PREGNANCY - Abnormal; Notable for the following components:   Preg Test, Ur Positive (*)    All other components within normal limits    EKG   Radiology No results found.  Procedures Procedures (including critical care time)  Medications Ordered in UC Medications - No data to display  Initial Impression / Assessment and Plan / UC Course  I have  reviewed the triage vital signs and the nursing notes.  Pertinent labs & imaging results that were available during my care of the patient were reviewed by me and considered in my medical decision making (see chart for details).    Pregnancy screening positive.  Suspect this is likely the cause of her fatigue.  Advised same and recommended follow-up with specialist.  Patient is agreeable.  Encouraged sooner follow-up with any worsening symptoms specifically abdominal pain and recommended emergency room evaluation for imaging should abdominal pain worsen.  Final Clinical Impressions(s) / UC Diagnoses   Final diagnoses:   Pregnancy related fatigue, antepartum  Positive pregnancy test   Discharge Instructions   None    ED Prescriptions   None    PDMP not reviewed this encounter.   Tomi Bamberger, PA-C 02/07/24 [phone]

## 2024-02-14 ENCOUNTER — Other Ambulatory Visit: Payer: Self-pay | Admitting: Nurse Practitioner

## 2024-02-14 ENCOUNTER — Encounter (HOSPITAL_COMMUNITY): Payer: Self-pay | Admitting: *Deleted

## 2024-02-14 ENCOUNTER — Inpatient Hospital Stay (HOSPITAL_COMMUNITY)
Admission: AD | Admit: 2024-02-14 | Discharge: 2024-02-14 | Disposition: A | Discharge status: Home / Self Care | Attending: Obstetrics and Gynecology | Admitting: Obstetrics and Gynecology

## 2024-02-14 ENCOUNTER — Inpatient Hospital Stay (HOSPITAL_COMMUNITY)

## 2024-02-14 DIAGNOSIS — O209 Hemorrhage in early pregnancy, unspecified: Secondary | ICD-10-CM | POA: Diagnosis present

## 2024-02-14 DIAGNOSIS — Z3A01 Less than 8 weeks gestation of pregnancy: Secondary | ICD-10-CM | POA: Diagnosis not present

## 2024-02-14 DIAGNOSIS — N939 Abnormal uterine and vaginal bleeding, unspecified: Secondary | ICD-10-CM

## 2024-02-14 DIAGNOSIS — R7989 Other specified abnormal findings of blood chemistry: Secondary | ICD-10-CM

## 2024-02-14 DIAGNOSIS — O3680X0 Pregnancy with inconclusive fetal viability, not applicable or unspecified: Secondary | ICD-10-CM | POA: Diagnosis not present

## 2024-02-14 LAB — URINALYSIS, ROUTINE W REFLEX MICROSCOPIC
Bilirubin Urine: NEGATIVE
Glucose, UA: NEGATIVE mg/dL
Hgb urine dipstick: NEGATIVE
Ketones, ur: NEGATIVE mg/dL
Leukocytes,Ua: NEGATIVE
Nitrite: NEGATIVE
Protein, ur: NEGATIVE mg/dL
Specific Gravity, Urine: 1.012 (ref 1.005–1.030)
pH: 7 (ref 5.0–8.0)

## 2024-02-14 LAB — WET PREP, GENITAL
Clue Cells Wet Prep HPF POC: NONE SEEN
Sperm: NONE SEEN
Trich, Wet Prep: NONE SEEN
WBC, Wet Prep HPF POC: <10 (ref <10)
Yeast Wet Prep HPF POC: NONE SEEN

## 2024-02-14 LAB — GC/CHLAMYDIA PROBE AMP (~~LOC~~) NOT AT ARMC
Chlamydia: NEGATIVE
Comment: NEGATIVE
Comment: NORMAL
Neisseria Gonorrhea: NEGATIVE

## 2024-02-14 LAB — CBC
HCT: 35.6 % — ABNORMAL LOW (ref 36.0–46.0)
Hemoglobin: 11.9 g/dL — ABNORMAL LOW (ref 12.0–15.0)
MCH: 30.7 pg (ref 26.0–34.0)
MCHC: 33.4 g/dL (ref 30.0–36.0)
MCV: 92 fL (ref 80.0–100.0)
Platelets: 287 10*3/uL (ref 150–400)
RBC: 3.87 MIL/uL (ref 3.87–5.11)
RDW: 13.7 % (ref 11.5–15.5)
WBC: 11.4 10*3/uL — ABNORMAL HIGH (ref 4.0–10.5)
nRBC: 0 % (ref 0.0–0.2)

## 2024-02-14 LAB — HCG, QUANTITATIVE, PREGNANCY: hCG, Beta Chain, Quant, S: 102 m[IU]/mL — ABNORMAL HIGH (ref <5)

## 2024-02-14 NOTE — MAU Note (Signed)
.  Joyce Solomon is a 37 y.o. at [redacted]w[redacted]d here in MAU reporting: having small-mod vag bleeding since yesterday. Mild cramping as well.   LMP: 01/08/2024 Onset of complaint: yesterday Pain score: 4 Vitals:   02/14/24 1314  BP: 113/64  Pulse: 74  Resp: 18  Temp: 98.9 F (37.2 C)     FHT: n/a  Lab orders placed from triage: U/A Wet prep, G/C

## 2024-02-14 NOTE — Progress Notes (Signed)
 Error

## 2024-02-14 NOTE — MAU Provider Note (Signed)
 Chief Complaint:  Vaginal Bleeding   HPI   Joyce Solomon is a 38 y.o. [id number] at [redacted]w[redacted]d who presents to maternity admissions reporting having moderate bleeding and cramping x 2 days . UPT here is positive. She offers no other complaints at this time.   Pregnancy Course: un established  Past Medical History:  Diagnosis Date   Anxiety    Blood transfusion without reported diagnosis    Headache    Iron Deficiency Anemia 2008   s/p transfusions 2009, 2010; inconsistent compliance with PO iron.    Non compliance w medication regimen 06/2015   stopped Remicade, may not have been taking Azulfidine.   Seasonal allergies    Thyrotoxicosis    Ulcerative Colitis 2007   on Humira   OB History  Gravida Para Term Preterm AB Living  7 4 4  2 4   SAB IAB Ectopic Multiple Live Births  1 1  0 4    # Outcome Date GA Lbr Len/2nd Weight Sex Type Anes PTL Lv  7 Current           6 Term 03/23/20 [redacted]w[redacted]d  2767 g F CS-LTranv Spinal  LIV     Birth Comments: wnl  5 Term 08/14/17 [redacted]w[redacted]d  3710 g M CS-LVertical EPI  LIV  4 Term 12/09/13 [redacted]w[redacted]d  2680 g F CS-LTranv EPI  LIV  3 Term 10/21/11 [redacted]w[redacted]d 20:42 / 00:33 2489 g F Vag-Spont EPI  LIV     Birth Comments: no dysmorphic features; apnea secondary to tight nuchal cord and acute vascular depletion; IUGR not evident clinically; see consult note.  2 IAB           1 SAB            Past Surgical History:  Procedure Laterality Date   CESAREAN SECTION N/A 12/09/2013   Procedure: Primary Cesarean Section Delivery Baby Girl @ 1959, Apgars 8/8;  Surgeon: Oliver Pila, MD;  Location: [facility];  Service: Obstetrics;  Laterality: N/A;   CESAREAN SECTION N/A 08/14/2017   Procedure: CESAREAN SECTION;  Surgeon: Huel Cote, MD;  Location: [facility] BIRTHING SUITES;  Service: Obstetrics;  Laterality: N/A;   CESAREAN SECTION N/A 03/23/2020   Procedure: CESAREAN SECTION;  Surgeon: Tereso Newcomer, MD;  Location: [facility] LD ORS;  Service: Obstetrics;  Laterality: N/A;    COLONOSCOPY  01/19/2011   Dr Evette Cristal. no active UC   COLONOSCOPY  2019   KN-prep exc-   COLONOSCOPY W/ BIOPSIES N/A 04/2014   Dr Arlyce Dice: Left sided colitis from prox descending to rectum; no inflammation at or proximal to splenic flexure   DILATION AND EVACUATION  08/21/2012   Procedure: DILATATION AND EVACUATION;  Surgeon: Oliver Pila, MD;  Location: [facility];  Service: Gynecology   TONSILLECTOMY     Family History  Problem Relation Age of Onset   Miscarriages / Stillbirths Other    Diabetes Sister    Hypotension Neg Hx    Alcohol abuse Neg Hx    Arthritis Neg Hx    Asthma Neg Hx    Birth defects Neg Hx    COPD Neg Hx    Depression Neg Hx    Drug abuse Neg Hx    Hearing loss Neg Hx    Heart disease Neg Hx    Hyperlipidemia Neg Hx    Hypertension Neg Hx    Kidney disease Neg Hx    Learning disabilities Neg Hx    Mental illness Neg Hx  Mental retardation Neg Hx    Colon cancer Neg Hx    Colon polyps Neg Hx    Esophageal cancer Neg Hx    Stomach cancer Neg Hx    Rectal cancer Neg Hx    Social History   Tobacco Use   Smoking status: Former    Current packs/day: 0.00    Types: Cigarettes    Start date: 12/14/2015    Quit date: 12/13/2018    Years since quitting: 5.1   Smokeless tobacco: Never   Tobacco comments:    smokes a cig "every blue moon"  Vaping Use   Vaping status: Never Used  Substance Use Topics   Alcohol use: Not Currently    Comment: special occasions   Drug use: No   Allergies  Allergen Reactions   Latex Rash   Remicade [Infliximab] Itching   No medications prior to admission.    I have reviewed patient's Past Medical Hx, Surgical Hx, Family Hx, Social Hx, medications and allergies.   ROS  Pertinent items noted in HPI and remainder of comprehensive ROS otherwise negative.   PHYSICAL EXAM  Patient Vitals for the past 24 hrs:  BP Temp Pulse Resp Height Weight  02/14/24 1314 113/64 98.9 F (37.2 C) 74 18 4\' 11"  (1.499 m) 58.1 kg     Constitutional: Well-developed, well-nourished female in no acute distress.  Cardiovascular: normal rate & rhythm, warm and well-perfused Respiratory: normal effort, no problems with respiration noted GI: Abd soft, NT MS: Extremities nontender, no edema, normal ROM Neurologic: Alert and oriented x 4.  GU: no CVA tenderness Pelvic: NEFG, physiologic discharge, no blood, cervix clean.       Labs: Results for orders placed or performed during the hospital encounter of 02/14/24 (from the past 24 hours)  hCG, quantitative, pregnancy     Status: Abnormal   Collection Time: 02/14/24 12:56 PM  Result Value Ref Range   hCG, Beta Chain, Quant, S 102 (H) <5 mIU/mL  CBC     Status: Abnormal   Collection Time: 02/14/24 12:56 PM  Result Value Ref Range   WBC 11.4 (H) 4.0 - 10.5 K/uL   RBC 3.87 3.87 - 5.11 MIL/uL   Hemoglobin 11.9 (L) 12.0 - 15.0 g/dL   HCT 40.9 (L) 81.1 - 91.4 %   MCV 92.0 80.0 - 100.0 fL   MCH 30.7 26.0 - 34.0 pg   MCHC 33.4 30.0 - 36.0 g/dL   RDW 78.2 95.6 - 21.3 %   Platelets 287 150 - 400 K/uL   nRBC 0.0 0.0 - 0.2 %  Wet prep, genital     Status: None   Collection Time: 02/14/24  1:20 PM   Specimen: Vaginal  Result Value Ref Range   Yeast Wet Prep HPF POC NONE SEEN NONE SEEN   Trich, Wet Prep NONE SEEN NONE SEEN   Clue Cells Wet Prep HPF POC NONE SEEN NONE SEEN   WBC, Wet Prep HPF POC <10 <10   Sperm NONE SEEN   Urinalysis, Routine w reflex microscopic -Urine, Clean Catch     Status: None   Collection Time: 02/14/24  1:20 PM  Result Value Ref Range   Color, Urine YELLOW YELLOW   APPearance CLEAR CLEAR   Specific Gravity, Urine 1.012 1.005 - 1.030   pH 7.0 5.0 - 8.0   Glucose, UA NEGATIVE NEGATIVE mg/dL   Hgb urine dipstick NEGATIVE NEGATIVE   Bilirubin Urine NEGATIVE NEGATIVE   Ketones, ur NEGATIVE NEGATIVE mg/dL  Protein, ur NEGATIVE NEGATIVE mg/dL   Nitrite NEGATIVE NEGATIVE   Leukocytes,Ua NEGATIVE NEGATIVE    Imaging:  US OB LESS THAN 14 WEEKS  WITH OB TRANSVAGINAL Result Date: 02/14/2024 CLINICAL DATA:  [id number] Vaginal bleeding affecting early pregnancy [id number]. EXAM: OBSTETRIC <14 WK [place] AND TRANSVAGINAL OB US TECHNIQUE: Both transabdominal and transvaginal ultrasound examinations were performed for complete evaluation of the gestation as well as the maternal uterus, adnexal regions, and pelvic cul-de-sac. Transvaginal technique was performed to assess early pregnancy. COMPARISON:  None Available. FINDINGS: Intrauterine gestational sac: None Maternal uterus/adnexae: Bilateral ovaries are within normal limits. No adnexal mass. The uterus is also within normal limits.  No focal mass. Endometrial thickness up to 5-6 mm.  No focal lesion. IMPRESSION: *No intrauterine gestation is seen and no adnexal mass is evident. These findings represent a pregnancy of unknown location. The sonographic differential diagnosis includes: an intrauterine gestation too early to visualize, a spontaneous abortion, or an occult ectopic pregnancy. Continued close clinical follow-up, serial serum beta-HCG levels and short term sonographic follow up in 10-14 days, or earlier if clinically indicated, is recommended. Electronically Signed   By: Jules Schick M.D.   On: 02/14/2024 15:33    MDM & MAU COURSE  MDM:   HIGH  VSS Labs unremarkable Blood Type is B Positive Hcg at 102 - need repeat level in 48 hours  Ultrasound - no findings of an IUP  Pregnancy of unknown location  I have reviewed the patient chart and performed the physical exam . I have ordered & interpreted the lab results and reviewed and reviewed the ultrasound images and radiology report  Medications ordered as stated below.  A/P as described below.  Counseling and education provided and patient agreeable  with plan as described below. Verbalized understanding.     MAU Course: Orders Placed This Encounter  Procedures   Wet prep, genital   US OB LESS THAN 14 WEEKS WITH OB TRANSVAGINAL   hCG,  quantitative, pregnancy   CBC   Urinalysis, Routine w reflex microscopic -Urine, Clean Catch   Discharge patient Discharge disposition: 01-Home or Self Care; Discharge patient date: 02/14/2024   No orders of the defined types were placed in this encounter.   ASSESSMENT   1. Elevated serum hCG   2. Pregnancy, location unknown   3. Vaginal bleeding     PLAN  Discharge home in stable condition with return precautions.  48 hour repeat hcg on  Saturday 02/16/24 for f/u hcg level Ectopic and bleeding precautions given. She verbalized understanding   Follow-up Information     [facility] Follow up.   Specialty: Obstetrics and Gynecology Why: Repeat pregnancy hormone levels Contact information: [address] [place] [address] [phone]                Allergies as of 02/14/2024       Reactions   Latex Rash   Remicade [infliximab] Itching        Medication List     TAKE these medications    diphenhydrAMINE 50 MG/ML injection Commonly known as: BENADRYL Inject 50 mg into the muscle as directed.   Entyvio 300 MG injection Generic drug: vedolizumab Administer as infusion over 30 minutes every 8 weeks   Ferrous Fumarate 150 MG Tabs Take 1 tablet (150 mg total) by mouth with breakfast, with lunch, and with evening meal.   folic acid 1 MG tablet Commonly known as: FOLVITE Take 1 tablet (1 mg  total) by mouth daily.   Lialda 1.2 g EC tablet Generic drug: mesalamine Take 1 tablet (1.2 g total) by mouth daily with breakfast.   sodium chloride 0.9 % Soln Inject into the vein as needed (Infuse intravenously Once every eight weeks over 30 minutes. Add vedolizumab (Entyvio) 300 mg (5 mL). Mix gently prior to administration. Storage: Room Temperature. Medication must be added to this container. Added By: Date/Time).  Infuse intravenously Once every eight weeks over 30 minutes. Add vedolizumab (Entyvio) 300 mg (5 mL). Mix gently  prior to administration. Storage: Room Temperature. Medication must be added to this container. Added By: Date/Time   sodium chloride flush 0.9 % Soln injection Inject 10 mLs into the vein as needed (Inject 10 mL as directed as needed for line care. Flush using the SAS (saline, administer IV, saline) method as directed. Use syringe one time only then discard syringe. *Saline is another name for Sodium Chloride 0.9%* Storage: Room Temperature.).   sterile water (preservative free) injection Use as directed for reconstituting vedolizumab (Entyvio) vials. Storage: Room Temperature.   zolpidem 10 MG tablet Commonly known as: AMBIEN Take 10 mg by mouth at bedtime as needed.       Marcell Barlow, MSN, [facility], [facility] [employer]

## 2024-02-17 ENCOUNTER — Other Ambulatory Visit: Payer: Self-pay

## 2024-02-17 ENCOUNTER — Inpatient Hospital Stay (HOSPITAL_COMMUNITY)
Admission: AD | Admit: 2024-02-17 | Discharge: 2024-02-17 | Disposition: A | Discharge status: Home / Self Care | Payer: Self-pay | Attending: Obstetrics & Gynecology | Admitting: Obstetrics & Gynecology

## 2024-02-17 ENCOUNTER — Encounter (HOSPITAL_COMMUNITY): Payer: Self-pay | Admitting: Obstetrics & Gynecology

## 2024-02-17 DIAGNOSIS — O039 Complete or unspecified spontaneous abortion without complication: Secondary | ICD-10-CM | POA: Diagnosis present

## 2024-02-17 LAB — URINALYSIS, ROUTINE W REFLEX MICROSCOPIC
Bilirubin Urine: NEGATIVE
Glucose, UA: NEGATIVE mg/dL
Hgb urine dipstick: NEGATIVE
Ketones, ur: NEGATIVE mg/dL
Leukocytes,Ua: NEGATIVE
Nitrite: NEGATIVE
Protein, ur: NEGATIVE mg/dL
Specific Gravity, Urine: 1.012 (ref 1.005–1.030)
pH: 5 (ref 5.0–8.0)

## 2024-02-17 LAB — HCG, QUANTITATIVE, PREGNANCY: hCG, Beta Chain, Quant, S: 9 m[IU]/mL — ABNORMAL HIGH (ref <5)

## 2024-02-17 NOTE — MAU Note (Signed)
 Joyce Solomon is a 38 y.o. at [redacted]w[redacted]d here in MAU reporting: vaginal bleeding that started earlier in the week (she can't remember the exact day), increased on Thursday and passed some clots. Now she is just having scant amount of dark brown discharge. Patient denies having any pain.  LMP: 01/08/2024 Onset of complaint: Thursday 02/14/2024 Pain score: 0 Vitals:   02/17/24 1032  BP: 118/60  Pulse: 77  Resp: 16  Temp: 98.7 F (37.1 C)  SpO2: 100%      Lab orders placed from triage: urinalysis

## 2024-02-17 NOTE — MAU Provider Note (Signed)
 History   Chief Complaint:  Vaginal Bleeding (Possible SAB/)   Joyce Solomon is  38 y.o. [id number] Patient's last menstrual period was 01/08/2024 (exact date).. Patient is here for follow up of quantitative HCG.   She is [redacted]w[redacted]d weeks gestation  by LMP. Presented to MAU on 02/14/2024 with complaints of vaginal bleeding and cramping x2 days, quant hCG 102 and ultrasound without IUP. Recommended repeat hCG in 48 hours after discharge at that time.  Since her last visit, the patient is without new complaint.   General ROS:  negative for fever, chills, vaginal discharge, severe abdominal pain. Positive for minimal vaginal bleeding and cramping.  Her previous Quantitative HCG values are: 102 on 02/14/2024   Physical Exam  Blood pressure 118/60, pulse 77, temperature 98.7 F (37.1 C), temperature source Oral, resp. rate 16, height (P) 4\' 11"  (1.499 m), weight (P) 58 kg, last menstrual period 01/08/2024, SpO2 100%.  Physical Exam Constitutional:      General: She is not in acute distress.    Appearance: Normal appearance.  HENT:     Head: Normocephalic and atraumatic.  Pulmonary:     Effort: Pulmonary effort is normal. No respiratory distress.  Musculoskeletal:     Cervical back: Normal range of motion.  Neurological:     General: No focal deficit present.     Mental Status: She is alert and oriented to person, place, and time. Mental status is at baseline.  Psychiatric:        Mood and Affect: Mood normal.        Thought Content: Thought content normal.        Judgment: Judgment normal.   Focused Gynecological Exam: normal external genitalia, vulva, vagina, cervix, uterus and adnexa, examination not indicated  Labs: Results for orders placed or performed during the hospital encounter of 02/17/24 (from the past 24 hours)  hCG, quantitative, pregnancy   Collection Time: 02/17/24 10:12 AM  Result Value Ref Range   hCG, Beta Chain, Quant, S 9 (H) <5 mIU/mL    Ultrasound Studies:    US OB LESS THAN 14 WEEKS WITH OB TRANSVAGINAL Result Date: 02/14/2024 CLINICAL DATA:  [id number] Vaginal bleeding affecting early pregnancy [id number]. EXAM: OBSTETRIC <14 WK [place] AND TRANSVAGINAL OB US TECHNIQUE: Both transabdominal and transvaginal ultrasound examinations were performed for complete evaluation of the gestation as well as the maternal uterus, adnexal regions, and pelvic cul-de-sac. Transvaginal technique was performed to assess early pregnancy. COMPARISON:  None Available. FINDINGS: Intrauterine gestational sac: None Maternal uterus/adnexae: Bilateral ovaries are within normal limits. No adnexal mass. The uterus is also within normal limits.  No focal mass. Endometrial thickness up to 5-6 mm.  No focal lesion. IMPRESSION: *No intrauterine gestation is seen and no adnexal mass is evident. These findings represent a pregnancy of unknown location. The sonographic differential diagnosis includes: an intrauterine gestation too early to visualize, a spontaneous abortion, or an occult ectopic pregnancy. Continued close clinical follow-up, serial serum beta-HCG levels and short term sonographic follow up in 10-14 days, or earlier if clinically indicated, is recommended. Electronically Signed   By: Jules Schick M.D.   On: 02/14/2024 15:33    Assessment: 1. SAB (spontaneous abortion) (Primary) - Discharge patient hCG today 9, consistent with SAB.  Plan: The patient is instructed to follow up in 1 week for repeat hCG in the office, and in 2 weeks for office visit with provider. Previously seen at [facility]. Reviewed return precautions including worsening vaginal bleeding, new vaginal discharge, fever/chills, or  severe abdominal pain.  Melida Quitter, PA

## 2024-02-25 ENCOUNTER — Ambulatory Visit

## 2024-03-03 ENCOUNTER — Ambulatory Visit: Admitting: Obstetrics and Gynecology

## 2024-03-26 ENCOUNTER — Telehealth: Payer: Self-pay

## 2024-03-26 ENCOUNTER — Other Ambulatory Visit (HOSPITAL_COMMUNITY): Payer: Self-pay

## 2024-03-26 NOTE — Telephone Encounter (Signed)
Infusion

## 2024-03-26 NOTE — Telephone Encounter (Signed)
 Ok, thanks for the update.

## 2024-03-26 NOTE — Telephone Encounter (Signed)
 Joyce Solomon, Clarification needed:   We currently do not have a treatment plan entered. Will she transition to pens after induction dose?  CMM is typically only use for pharmacy benefit auths only.  If the Md is wanting IV infusions they will need to enter a treatment plan in Epic and I will begin the medical benefit auth process.

## 2024-03-26 NOTE — Telephone Encounter (Signed)
 I havent received anything from any providers on my end. It was received through [facility] and I know I dont process for the infusions. I was told to route to you since you work with the infusions but it looks like documentation hasn't been made for further info. I'm sorry I dont have much info for you

## 2024-03-27 NOTE — Telephone Encounter (Signed)
 Hey Beth. I saw you worked with this patient and was wondering if you had any information to if this patient is still receiving this medication and if so, where she is getting it from? It's for the Entyvio  Infusion. Thanks so much!

## 2024-03-27 NOTE — Telephone Encounter (Signed)
 The patient gets home infusion services through [facility]. I do not know why anything was sent to you. I would think [facility] has to do their own authorizations.  I will take care of it. Very sorry for the confusion.

## 2024-03-27 NOTE — Telephone Encounter (Signed)
No problem, thank you so much!! :)

## 2024-04-10 ENCOUNTER — Telehealth: Payer: Self-pay | Admitting: Gastroenterology

## 2024-04-10 NOTE — Telephone Encounter (Signed)
 Inbound call from patient stating she has FMLA paperwork that needs to be filled out by Dr. Leonia Raman. States she has 15 days to submit paperwork. Requesting a call back. Please advise, thank you.

## 2024-04-10 NOTE — Telephone Encounter (Signed)
 The pt returned call and has been advised that she should bring the [facility] paperwork and drop off at the front desk on the 3 rd floor.  She understands that there will be a charge.

## 2024-04-10 NOTE — Telephone Encounter (Signed)
 Left message on machine to call back

## 2024-04-17 ENCOUNTER — Emergency Department (HOSPITAL_COMMUNITY)
Admission: EM | Admit: 2024-04-17 | Discharge: 2024-04-17 | Disposition: A | Discharge status: Home / Self Care | Attending: Emergency Medicine | Admitting: Emergency Medicine

## 2024-04-17 ENCOUNTER — Encounter (HOSPITAL_COMMUNITY): Payer: Self-pay

## 2024-04-17 ENCOUNTER — Other Ambulatory Visit: Payer: Self-pay

## 2024-04-17 DIAGNOSIS — M7989 Other specified soft tissue disorders: Secondary | ICD-10-CM | POA: Diagnosis present

## 2024-04-17 DIAGNOSIS — L03011 Cellulitis of right finger: Secondary | ICD-10-CM | POA: Insufficient documentation

## 2024-04-17 DIAGNOSIS — L03019 Cellulitis of unspecified finger: Secondary | ICD-10-CM

## 2024-04-17 MED ORDER — AMOXICILLIN-POT CLAVULANATE 875-125 MG PO TABS: 1.0000 | ORAL_TABLET | Freq: Two times a day (BID) | ORAL | 0 refills | Status: DC

## 2024-04-17 MED ORDER — LIDOCAINE HCL (PF) 1 % IJ SOLN
30.0000 mL | Freq: Once | INTRAMUSCULAR | Status: AC
  Administered 2024-04-17: 30 mL
  Filled 2024-04-17: qty 30

## 2024-04-17 MED ORDER — AMOXICILLIN-POT CLAVULANATE 875-125 MG PO TABS
1.0000 | ORAL_TABLET | Freq: Once | ORAL | Status: AC
  Administered 2024-04-17: 1 via ORAL
  Filled 2024-04-17: qty 1

## 2024-04-17 NOTE — ED Provider Notes (Signed)
  EMERGENCY DEPARTMENT AT [facility] Provider Note   CSN: [id number] Arrival date & time: 04/17/24  0047     History Chief Complaint  Patient presents with   Finger Swelling    HPI Joyce Solomon is a 38 y.o. female presenting for severe right hand third finger swelling at the distal phalanx. 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  It started swelling up 2 days ago.  Endorses fairly substantial pain at the site.  Denies fevers chills nausea vomiting shortness of breath..   Patient's recorded medical, surgical, social, medication list and allergies were reviewed in the Snapshot window as part of the initial history.   Review of Systems   Review of Systems  Constitutional:  Negative for chills and fever.  HENT:  Negative for ear pain and sore throat.   Eyes:  Negative for pain and visual disturbance.  Respiratory:  Negative for cough and shortness of breath.   Cardiovascular:  Negative for chest pain and palpitations.  Gastrointestinal:  Negative for abdominal pain and vomiting.  Genitourinary:  Negative for dysuria and hematuria.  Musculoskeletal:  Negative for arthralgias and back pain.  Skin:  Negative for color change and rash.  Neurological:  Negative for seizures and syncope.  All other systems reviewed and are negative.   Physical Exam Updated Vital Signs BP 136/86   Pulse 67   Temp 97.9 F (36.6 C)   Resp 18   Ht 4\' 11"  (1.499 m)   Wt 59 kg   LMP 01/08/2024 (Exact Date)   SpO2 100%   BMI 26.26 kg/m  Physical Exam Vitals and nursing note reviewed.  Constitutional:      General: She is not in acute distress.    Appearance: She is well-developed.  HENT:     Head: Normocephalic and atraumatic.  Eyes:     Conjunctiva/sclera: Conjunctivae normal.  Cardiovascular:     Rate and Rhythm: Normal rate and regular rhythm.     Heart sounds: No murmur heard. Pulmonary:     Effort: Pulmonary  effort is normal. No respiratory distress.     Breath sounds: Normal breath sounds.  Abdominal:     General: There is no distension.     Palpations: Abdomen is soft.     Tenderness: There is no abdominal tenderness. There is no right CVA tenderness or left CVA tenderness.  Musculoskeletal:        General: No swelling or tenderness. Normal range of motion.     Cervical back: Neck supple.  Skin:    General: Skin is warm and dry.  Neurological:     General: No focal deficit present.     Mental Status: She is alert and oriented to person, place, and time. Mental status is at baseline.     Cranial Nerves: No cranial nerve deficit.      ED Course/ Medical Decision Making/ A&P    Procedures .Incision and Drainage  Date/Time: 04/17/2024 2:08 AM  Performed by: Onetha Bile, MD Authorized by: Onetha Bile, MD   Consent:    Consent obtained:  Verbal   Consent given by:  Patient   Risks, benefits, and alternatives were discussed: yes     Risks discussed:  Bleeding   Alternatives discussed:  No treatment Universal protocol:    Patient identity confirmed:  Arm band Location:    Type:  Abscess   Location:  Upper extremity   Upper extremity  location:  Finger   Finger location:  R long finger Pre-procedure details:    Skin preparation:  Chlorhexidine with alcohol Sedation:    Sedation type:  None Anesthesia:    Anesthesia method:  Nerve block   Block anesthetic:  Lidocaine  1% w/o epi   Block injection procedure:  Negative aspiration for blood   Block outcome:  Anesthesia achieved Procedure type:    Complexity:  Simple Procedure details:    Incision types:  Stab incision and single with marsupialization Post-procedure details:    Procedure completion:  Tolerated    Medications Ordered in ED Medications  lidocaine  (PF) (XYLOCAINE ) 1 % injection 30 mL (has no administration in time range)  amoxicillin -clavulanate (AUGMENTIN ) 875-125 MG per tablet 1 tablet (has no  administration in time range)    Medical Decision Making:   Joyce Solomon is a 38 y.o. female who presented to the ED today with finger injury detailed above.    Patient placed on continuous vitals and telemetry monitoring while in ED which was reviewed periodically.  Complete initial physical exam performed, notably the patient  was hemodynamically stable no acute distress.    Reviewed and confirmed nursing documentation for past medical history, family history, social history.    Initial Assessment:   Patient had a nail injury with developing infection trapped underneath it.  Digital block performed, nail trephinated, purulence expressed.  Pain improved.  Patient to follow-up with primary care in the outpatient setting, antibiotics in interim for surrounding cellulitis.  Disposition:  I have considered need for hospitalization, however, considering all of the above, I believe this patient is stable for discharge at this time.  Patient/family educated about specific return precautions for given chief complaint and symptoms.  Patient/family educated about follow-up with PCP.     Patient/family expressed understanding of return precautions and need for follow-up. Patient spoken to regarding all imaging and laboratory results and appropriate follow up for these results. All education provided in verbal form with additional information in written form. Time was allowed for answering of patient questions. Patient discharged.    Emergency Department Medication Summary:   Medications  lidocaine  (PF) (XYLOCAINE ) 1 % injection 30 mL (has no administration in time range)  amoxicillin -clavulanate (AUGMENTIN ) 875-125 MG per tablet 1 tablet (has no administration in time range)        Clinical Impression:  1. Felon of finger      Discharge   Final Clinical Impression(s) / ED Diagnoses Final diagnoses:  Felon of finger    Rx / DC Orders ED Discharge Orders          Ordered     amoxicillin -clavulanate (AUGMENTIN ) 875-125 MG tablet  Every 12 hours        04/17/24 0210              Onetha Bile, MD 04/17/24 0210

## 2024-04-17 NOTE — ED Triage Notes (Signed)
 Pt reports nail breaking on right middle finger and now finger is red, swollen and painful.

## 2024-04-28 ENCOUNTER — Telehealth: Payer: Self-pay | Admitting: *Deleted

## 2024-04-28 NOTE — Telephone Encounter (Signed)
 Faxed FMLA paperwork to patient's company HR department at [phone].

## 2024-06-10 ENCOUNTER — Telehealth: Payer: Self-pay | Admitting: Gastroenterology

## 2024-06-10 NOTE — Telephone Encounter (Signed)
 Patient receives her infusions from [facility]. We have not done any of the PA's for her Entyvio .

## 2024-06-10 NOTE — Telephone Encounter (Signed)
 Call from insurance to let us  know Entyvio  will require a PA.

## 2024-06-10 NOTE — Telephone Encounter (Signed)
 Inbound call [facility] requesting a call back at [phone]  In regards to antivio   Please advise.

## 2024-06-12 ENCOUNTER — Telehealth: Payer: Self-pay | Admitting: Gastroenterology

## 2024-06-12 NOTE — Telephone Encounter (Signed)
 Appointment scheduled with the patient. She does not know who the PA is to be done with.

## 2024-06-12 NOTE — Telephone Encounter (Signed)
 Called the patient. No answer. Left message asking for a return call. I need the telephone number or name of who to call. I need to speak with the patient to arrange a follow up appointment.

## 2024-06-12 NOTE — Telephone Encounter (Signed)
 Inbound call from patient Insurance stating that they are needing a Prior Authorization for this patient Entyvio . Please advise.

## 2024-06-16 ENCOUNTER — Other Ambulatory Visit (HOSPITAL_COMMUNITY): Payer: Self-pay

## 2024-06-16 ENCOUNTER — Telehealth: Payer: Self-pay

## 2024-06-16 NOTE — Telephone Encounter (Signed)
 Pharmacy Patient Advocate Encounter   Received notification from CoverMyMeds that prior authorization for Entyvio  300MG  solution is required/requested.   Insurance verification completed.   The patient is insured through [facility] .   Prior Authorization for Entyvio  300MG  solution has been APPROVED from 06-16-2024 to 06-16-2025   PA #/Case ID/Reference #: [id number]

## 2024-06-27 ENCOUNTER — Other Ambulatory Visit: Payer: Self-pay

## 2024-06-27 ENCOUNTER — Ambulatory Visit
Admission: EM | Admit: 2024-06-27 | Discharge: 2024-06-27 | Disposition: A | Discharge status: Home / Self Care | Attending: Family Medicine | Admitting: Family Medicine

## 2024-06-27 DIAGNOSIS — R519 Headache, unspecified: Secondary | ICD-10-CM | POA: Diagnosis present

## 2024-06-27 DIAGNOSIS — R82998 Other abnormal findings in urine: Secondary | ICD-10-CM | POA: Insufficient documentation

## 2024-06-27 DIAGNOSIS — R1030 Lower abdominal pain, unspecified: Secondary | ICD-10-CM | POA: Diagnosis present

## 2024-06-27 DIAGNOSIS — M545 Low back pain, unspecified: Secondary | ICD-10-CM | POA: Diagnosis present

## 2024-06-27 LAB — POCT URINE DIPSTICK
Bilirubin, UA: NEGATIVE
Blood, UA: NEGATIVE
Glucose, UA: NEGATIVE mg/dL
Ketones, POC UA: NEGATIVE mg/dL
Nitrite, UA: NEGATIVE
POC PROTEIN,UA: NEGATIVE
Spec Grav, UA: 1.025 (ref 1.010–1.025)
Urobilinogen, UA: 1 U/dL
pH, UA: 6 (ref 5.0–8.0)

## 2024-06-27 LAB — POCT URINE PREGNANCY: Preg Test, Ur: NEGATIVE

## 2024-06-27 MED ORDER — NITROFURANTOIN MONOHYD MACRO 100 MG PO CAPS: 100.0000 mg | ORAL_CAPSULE | Freq: Two times a day (BID) | ORAL | 0 refills | Status: DC

## 2024-06-27 NOTE — ED Triage Notes (Signed)
 Pt c/o int. HAx2wks. Lower back pain and cramping in RLQ, LLQ of abdomenx2d

## 2024-06-27 NOTE — Discharge Instructions (Addendum)
 The clinic will contact you with results of the urine culture done today if positive.  Start Macrobid  twice daily for 7 days.  Lots of rest and fluids.  Continue over-the-counter Tylenol  or Goody powders as needed for your headaches.  Please follow-up with your PCP if your symptoms do not improve.  Please go to the ER for any worsening symptoms.  Hope you feel better soon!

## 2024-06-27 NOTE — ED Provider Notes (Signed)
 [facility]    CSN: [id number] Arrival date & time: 06/27/24  1658      History   Chief Complaint No chief complaint on file.   HPI Joyce Solomon is a 38 y.o. female with a past medical history of ulcerative colitis, anemia, anxiety presents for lower abdominal pain and headaches.  Patient reports 2 weeks of intermittent headaches that resolved with over-the-counter Tylenol  or Goody powder.  She denies any visual changes, dizziness, nausea/vomiting, syncope, neck pain, fevers or chills.  She states her current headache is a 2 out of 10 and denies worst headache of her life.  Denies thunderclap headache.  No family history of first-degree relative with SAH.  In addition she reports 2 days of lower abdominal pain/cramping.  States sometimes she has low back pain but she does stand a lot for work.  Abdominal pain does not radiate.  Again she denies any nausea/vomiting or fevers.  No dysuria.  She is eating and drinking normally.  She states that her symptoms are not consistent with ulcerative colitis.  Has a history of C-section but otherwise no abdominal surgeries.  Patient has no other concerns at this time.  HPI  Past Medical History:  Diagnosis Date   Anxiety    Blood transfusion without reported diagnosis    Headache    Iron Deficiency Anemia 2008   s/p transfusions 2009, 2010; inconsistent compliance with PO iron.    Non compliance w medication regimen 06/2015   stopped Remicade , may not have been taking Azulfidine .   Seasonal allergies    Thyrotoxicosis    Ulcerative Colitis 2007   on Humira     Patient Active Problem List   Diagnosis Date Noted   Subclinical hyperthyroidism 08/12/2020   C. difficile diarrhea 03/24/2020   History of 2 cesarean sections 03/24/2020   Postpartum care following cesarean delivery 03/24/2020   Group B Streptococcus carrier, +RV culture, currently pregnant 03/14/2020   Supervision of high risk pregnancy, antepartum 09/09/2019    Anal fissure 09/11/2018   Rectal bleeding 04/23/2014   History of cesarean delivery affecting pregnancy 12/10/2013   Prior pregnancy complicated by SGA (small for gestational age), antepartum 12/09/2013   DENTAL CARIES 12/22/2009   Thyrotoxicosis 08/03/2008   Constipation 07/29/2008   ACNE, MILD 07/29/2008   Anemia 06/30/2008   Other ulcerative colitis without complications (HCC) 11/20/2005    Past Surgical History:  Procedure Laterality Date   CESAREAN SECTION N/A 12/09/2013   Procedure: Primary Cesarean Section Delivery Baby Girl @ 1959, Apgars 8/8;  Surgeon: Nathanel LELON Bunker, MD;  Location: [facility];  Service: Obstetrics;  Laterality: N/A;   CESAREAN SECTION N/A 08/14/2017   Procedure: CESAREAN SECTION;  Surgeon: Bunker Nathanel, MD;  Location: [facility];  Service: Obstetrics;  Laterality: N/A;   CESAREAN SECTION N/A 03/23/2020   Procedure: CESAREAN SECTION;  Surgeon: Herchel Gloris LABOR, MD;  Location: [facility];  Service: Obstetrics;  Laterality: N/A;   COLONOSCOPY  01/19/2011   Dr Lennard. no active UC   COLONOSCOPY  2019   KN-prep exc-   COLONOSCOPY W/ BIOPSIES N/A 04/2014   Dr Debrah: Left sided colitis from prox descending to rectum; no inflammation at or proximal to splenic flexure   DILATION AND EVACUATION  08/21/2012   Procedure: DILATATION AND EVACUATION;  Surgeon: Nathanel LELON Bunker, MD;  Location: [facility];  Service: Gynecology   TONSILLECTOMY      OB History     Gravida  7   Para  4   Term  4   Preterm      AB  2   Living  4      SAB  1   IAB  1   Ectopic      Multiple  0   Live Births  4            Home Medications    Prior to Admission medications   Medication Sig Start Date End Date Taking? Authorizing Provider  nitrofurantoin , macrocrystal-monohydrate, (MACROBID ) 100 MG capsule Take 1 capsule (100 mg total) by mouth 2 (two) times daily. 06/27/24  Yes Darien Mignogna, Jodi R, NP  amoxicillin -clavulanate (AUGMENTIN ) 875-125 MG tablet Take  1 tablet by mouth every 12 (twelve) hours. 04/17/24   Jerral Meth, MD  diphenhydrAMINE  (BENADRYL ) 50 MG/ML injection Inject 50 mg into the muscle as directed. 08/01/23 07/26/24  [provider]  sodium chloride  0.9 % SOLN Inject into the vein as needed (Infuse intravenously Once every eight weeks over 30 minutes. Add vedolizumab  (Entyvio ) 300 mg (5 mL). Mix gently prior to administration. Storage: Room Temperature. Medication must be added to this container. Added By: Date/Time).  Infuse intravenously Once every eight weeks over 30 minutes. Add vedolizumab  (Entyvio ) 300 mg (5 mL). Mix gently prior to administration. Storage: Room Temperature. Medication must be added to this container. Added By: Date/Time 08/01/23 07/26/24  [provider]  sodium chloride  flush 0.9 % SOLN injection Inject 10 mLs into the vein as needed (Inject 10 mL as directed as needed for line care. Flush using the SAS (saline, administer IV, saline) method as directed. Use syringe one time only then discard syringe. *Saline is another name for Sodium Chloride  0.9%* Storage: Room Temperature.). 08/01/23 07/26/24  [provider]  vedolizumab  (ENTYVIO ) 300 MG injection Administer as infusion over 30 minutes every 8 weeks 10/08/23   Nandigam, Kavitha V, MD  Water  For Injection Sterile (STERILE WATER , PRESERVATIVE FREE,) injection Use as directed for reconstituting vedolizumab  (Entyvio ) vials. Storage: Room Temperature. 08/01/23 07/26/24  [provider]  zolpidem  (AMBIEN ) 10 MG tablet Take 10 mg by mouth at bedtime as needed. 11/21/21   [provider]    Family History Family History  Problem Relation Age of Onset   Miscarriages / Stillbirths Other    Diabetes Sister    Hypotension Neg Hx    Alcohol abuse Neg Hx    Arthritis Neg Hx    Asthma Neg Hx    Birth defects Neg Hx    COPD Neg Hx    Depression Neg Hx    Drug abuse Neg Hx    Hearing loss Neg Hx    Heart disease Neg Hx     Hyperlipidemia Neg Hx    Hypertension Neg Hx    Kidney disease Neg Hx    Learning disabilities Neg Hx    Mental illness Neg Hx    Mental retardation Neg Hx    Colon cancer Neg Hx    Colon polyps Neg Hx    Esophageal cancer Neg Hx    Stomach cancer Neg Hx    Rectal cancer Neg Hx     Social History Social History   Tobacco Use   Smoking status: Former    Current packs/day: 0.00    Types: Cigarettes    Start date: 12/14/2015    Quit date: 12/13/2018    Years since quitting: 5.5   Smokeless tobacco: Never   Tobacco comments:    smokes a cig every blue moon  Vaping Use   Vaping status: Never Used  Substance Use Topics   Alcohol use: Not Currently    Comment: special occasions   Drug use: No     Allergies   Latex and Remicade  [infliximab ]   Review of Systems Review of Systems  Gastrointestinal:  Positive for abdominal pain.  Neurological:  Positive for headaches.     Physical Exam Triage Vital Signs ED Triage Vitals  Encounter Vitals Group     BP 06/27/24 1733 107/73     Girls Systolic BP Percentile --      Girls Diastolic BP Percentile --      Boys Systolic BP Percentile --      Boys Diastolic BP Percentile --      Pulse Rate 06/27/24 1733 69     Resp 06/27/24 1733 16     Temp 06/27/24 1733 97.7 F (36.5 C)     Temp Source 06/27/24 1733 Oral     SpO2 06/27/24 1733 98 %     Weight --      Height --      Head Circumference --      Peak Flow --      Pain Score 06/27/24 1731 5     Pain Loc --      Pain Education --      Exclude from Growth Chart --    No data found.  Updated Vital Signs BP 107/73   Pulse 69   Temp 97.7 F (36.5 C) (Oral)   Resp 16   LMP 05/03/2024 (Exact Date)   SpO2 98%   Breastfeeding No   Visual Acuity Right Eye Distance:   Left Eye Distance:   Bilateral Distance:    Right Eye Near:   Left Eye Near:    Bilateral Near:     Physical Exam Vitals and nursing note reviewed.  Constitutional:      General: She is not in  acute distress.    Appearance: Normal appearance. She is not ill-appearing.  HENT:     Head: Normocephalic and atraumatic.  Eyes:     Extraocular Movements: Extraocular movements intact.     Conjunctiva/sclera: Conjunctivae normal.     Pupils: Pupils are equal, round, and reactive to light.  Cardiovascular:     Rate and Rhythm: Normal rate.  Pulmonary:     Effort: Pulmonary effort is normal.  Abdominal:     General: Bowel sounds are normal. There is no distension.     Palpations: Abdomen is soft.     Tenderness: There is no abdominal tenderness. There is no right CVA tenderness, left CVA tenderness, guarding or rebound.  Musculoskeletal:     Thoracic back: Normal.     Lumbar back: Tenderness present. No swelling, deformity, lacerations, spasms or bony tenderness. Normal range of motion. Negative right straight leg raise test and negative left straight leg raise test.       Back:  Skin:    General: Skin is warm and dry.  Neurological:     General: No focal deficit present.     Mental Status: She is alert and oriented to person, place, and time.     GCS: GCS eye subscore is 4. GCS verbal subscore is 5. GCS motor subscore is 6.     Cranial Nerves: No facial asymmetry.     Motor: No weakness.     Coordination: Finger-Nose-Finger Test normal.     Gait: Tandem walk normal.  Psychiatric:  Mood and Affect: Mood normal.        Behavior: Behavior normal.      UC Treatments / Results  Labs (all labs ordered are listed, but only abnormal results are displayed) Labs Reviewed  POCT URINE DIPSTICK - Abnormal; Notable for the following components:      Result Value   Leukocytes, UA Trace (*)    All other components within normal limits  URINE CULTURE  POCT URINE PREGNANCY    EKG   Radiology No results found.  Procedures Procedures (including critical care time)  Medications Ordered in UC Medications - No data to display  Initial Impression / Assessment and Plan /  UC Course  I have reviewed the triage vital signs and the nursing notes.  Pertinent labs & imaging results that were available during my care of the patient were reviewed by me and considered in my medical decision making (see chart for details).     Reviewed exam and symptoms with patient.  Urine shows trace leukocytes, will culture and start Macrobid .  Abdominal exam is reassuring.  Patient headaches are not worse headaches of life and they resolve with over-the-counter medications.  Advised to continue this and increase hydration.  Advised keeping a headache diary and taking to PCP.  Low back pain consistent with musculoskeletal cause.  Avoid NSAIDs due to her colitis, but she can continue over-the-counter Tylenol , Goody powder, and heat.  Advised PCP follow-up if symptoms do not improve.  ER precautions reviewed. Final Clinical Impressions(s) / UC Diagnoses   Final diagnoses:  Lower abdominal pain  Leukocytes in urine  Acute bilateral low back pain without sciatica  Acute nonintractable headache, unspecified headache type     Discharge Instructions      The clinic will contact you with results of the urine culture done today if positive.  Start Macrobid  twice daily for 7 days.  Lots of rest and fluids.  Continue over-the-counter Tylenol  or Goody powders as needed for your headaches.  Please follow-up with your PCP if your symptoms do not improve.  Please go to the ER for any worsening symptoms.  Hope you feel better soon!    ED Prescriptions     Medication Sig Dispense Auth. Provider   nitrofurantoin , macrocrystal-monohydrate, (MACROBID ) 100 MG capsule Take 1 capsule (100 mg total) by mouth 2 (two) times daily. 10 capsule Kathren Scearce, Jodi R, NP      PDMP not reviewed this encounter.   Loreda Myla SAUNDERS, NP 06/27/24 [phone]

## 2024-06-29 LAB — URINE CULTURE: Culture: 20000 — AB

## 2024-06-30 ENCOUNTER — Ambulatory Visit (HOSPITAL_COMMUNITY): Payer: Self-pay

## 2024-07-10 ENCOUNTER — Encounter: Payer: Self-pay | Admitting: Gastroenterology

## 2024-07-10 ENCOUNTER — Ambulatory Visit (INDEPENDENT_AMBULATORY_CARE_PROVIDER_SITE_OTHER): Admitting: Gastroenterology

## 2024-07-10 ENCOUNTER — Other Ambulatory Visit: Payer: Self-pay | Admitting: *Deleted

## 2024-07-10 ENCOUNTER — Other Ambulatory Visit

## 2024-07-10 VITALS — BP 102/62 | HR 62 | Ht 59.0 in | Wt 132.0 lb

## 2024-07-10 DIAGNOSIS — K5900 Constipation, unspecified: Secondary | ICD-10-CM | POA: Diagnosis not present

## 2024-07-10 DIAGNOSIS — D509 Iron deficiency anemia, unspecified: Secondary | ICD-10-CM

## 2024-07-10 DIAGNOSIS — K5100 Ulcerative (chronic) pancolitis without complications: Secondary | ICD-10-CM

## 2024-07-10 DIAGNOSIS — K51918 Ulcerative colitis, unspecified with other complication: Secondary | ICD-10-CM | POA: Diagnosis not present

## 2024-07-10 DIAGNOSIS — E538 Deficiency of other specified B group vitamins: Secondary | ICD-10-CM

## 2024-07-10 DIAGNOSIS — R5383 Other fatigue: Secondary | ICD-10-CM | POA: Diagnosis not present

## 2024-07-10 DIAGNOSIS — E559 Vitamin D deficiency, unspecified: Secondary | ICD-10-CM

## 2024-07-10 DIAGNOSIS — D500 Iron deficiency anemia secondary to blood loss (chronic): Secondary | ICD-10-CM

## 2024-07-10 LAB — IBC PANEL
Iron: 37 ug/dL — ABNORMAL LOW (ref 42–145)
Saturation Ratios: 10.8 % — ABNORMAL LOW (ref 20.0–50.0)
TIBC: 341.6 ug/dL (ref 250.0–450.0)
Transferrin: 244 mg/dL (ref 212.0–360.0)

## 2024-07-10 LAB — CBC WITH DIFFERENTIAL/PLATELET
Basophils Absolute: 0 K/uL (ref 0.0–0.1)
Basophils Relative: 0.5 % (ref 0.0–3.0)
Eosinophils Absolute: 0.3 K/uL (ref 0.0–0.7)
Eosinophils Relative: 3.4 % (ref 0.0–5.0)
HCT: 39.1 % (ref 36.0–46.0)
Hemoglobin: 12.9 g/dL (ref 12.0–15.0)
Lymphocytes Relative: 30.3 % (ref 12.0–46.0)
Lymphs Abs: 2.9 K/uL (ref 0.7–4.0)
MCHC: 33.1 g/dL (ref 30.0–36.0)
MCV: 93.1 fl (ref 78.0–100.0)
Monocytes Absolute: 0.9 K/uL (ref 0.1–1.0)
Monocytes Relative: 9.5 % (ref 3.0–12.0)
Neutro Abs: 5.5 K/uL (ref 1.4–7.7)
Neutrophils Relative %: 56.3 % (ref 43.0–77.0)
Platelets: 262 K/uL (ref 150.0–400.0)
RBC: 4.19 Mil/uL (ref 3.87–5.11)
RDW: 14.2 % (ref 11.5–15.5)
WBC: 9.7 K/uL (ref 4.0–10.5)

## 2024-07-10 LAB — COMPREHENSIVE METABOLIC PANEL WITH GFR
ALT: 8 U/L (ref 0–35)
AST: 9 U/L (ref 0–37)
Albumin: 4 g/dL (ref 3.5–5.2)
Alkaline Phosphatase: 72 U/L (ref 39–117)
BUN: 8 mg/dL (ref 6–23)
CO2: 28 meq/L (ref 19–32)
Calcium: 8.8 mg/dL (ref 8.4–10.5)
Chloride: 104 meq/L (ref 96–112)
Creatinine, Ser: 0.56 mg/dL (ref 0.40–1.20)
GFR: 115.59 mL/min (ref >60.00)
Glucose, Bld: 88 mg/dL (ref 70–99)
Potassium: 4.1 meq/L (ref 3.5–5.1)
Sodium: 138 meq/L (ref 135–145)
Total Bilirubin: 0.2 mg/dL (ref 0.2–1.2)
Total Protein: 6.8 g/dL (ref 6.0–8.3)

## 2024-07-10 LAB — IBC + FERRITIN
Ferritin: 12 ng/mL (ref 10.0–291.0)
Iron: 37 ug/dL — ABNORMAL LOW (ref 42–145)
Saturation Ratios: 10.8 % — ABNORMAL LOW (ref 20.0–50.0)
TIBC: 341.6 ug/dL (ref 250.0–450.0)
Transferrin: 244 mg/dL (ref 212.0–360.0)

## 2024-07-10 LAB — B12 AND FOLATE PANEL
Folate: 17.8 ng/mL (ref >5.9)
Vitamin B-12: 521 pg/mL (ref 211–911)

## 2024-07-10 LAB — HIGH SENSITIVITY CRP: CRP, High Sensitivity: 0.71 mg/L (ref 0.000–5.000)

## 2024-07-10 LAB — SEDIMENTATION RATE: Sed Rate: 14 mm/h (ref 0–20)

## 2024-07-10 LAB — VITAMIN D 25 HYDROXY (VIT D DEFICIENCY, FRACTURES): VITD: 13.77 ng/mL — ABNORMAL LOW (ref 30.00–100.00)

## 2024-07-10 MED ORDER — DICYCLOMINE HCL 10 MG PO CAPS
10.0000 mg | ORAL_CAPSULE | Freq: Three times a day (TID) | ORAL | 3 refills | Status: DC | PRN
Start: 2024-07-10 — End: 2024-10-07

## 2024-07-10 NOTE — Patient Instructions (Addendum)
 Your provider has requested that you go to the basement level for lab work before leaving today. Press B on the elevator. The lab is located at the first door on the left as you exit the elevator.  VISIT SUMMARY:  Today, we discussed your recent flare-up of ulcerative colitis and addressed your symptoms, including mild cramping, bloating, and occasional blood in your stool. We also reviewed your history of iron, B12, and vitamin D  deficiencies.  YOUR PLAN:  ULCERATIVE COLITIS: You experienced a flare-up of your ulcerative colitis with symptoms like mild cramping, bloating, and occasional blood in your stool. -Take dicyclomine  10 mg every 8 hours as needed for cramping. You can take two tablets if the cramps are severe. -We will do a fecal calprotectin test to check the activity of your colitis. -We will perform blood work to check your complete blood count (CBC), liver and kidney function, CRP, ESR, iron levels, B12, and vitamin D . -Use Miralax  at bedtime as needed for constipation.  IRON DEFICIENCY: You have a history of iron deficiency. -We will check your iron levels with a blood test.  VITAMIN B12 DEFICIENCY: You have a history of vitamin B12 deficiency. -We will check your vitamin B12 levels with a blood test.  VITAMIN D  DEFICIENCY: You have a history of vitamin D  deficiency. -We will check your vitamin D  levels with a blood test.  Please call to schedule follow up in 6 months  Due to recent changes in healthcare laws, you may see the results of your imaging and laboratory studies on MyChart before your provider has had a chance to review them.  We understand that in some cases there may be results that are confusing or concerning to you. Not all laboratory results come back in the same time frame and the provider may be waiting for multiple results in order to interpret others.  Please give us  48 hours in order for your provider to thoroughly review all the results before contacting  the office for clarification of your results.    I appreciate the  opportunity to care for you  Thank You   Iyah Laguna , MD

## 2024-07-10 NOTE — Progress Notes (Signed)
 SABRA

## 2024-07-10 NOTE — Progress Notes (Signed)
 RIN GORTON    [id number]    02/08/1986  Primary Care Physician:Avbuere, Aliene, MD  Referring Physician: Shelda Aliene, MD [id number] [place],  [place] [address]   Chief complaint: Ulcerative colitis  Discussed the use of AI scribe software for clinical note transcription with the patient, who gave verbal consent to proceed.  History of Present Illness Joyce Solomon is a 38 year old female with ulcerative colitis who presents for follow-up  Ulcerative colitis symptoms and flare - Flare-up occurred approximately two weeks ago, shortly before scheduled Entyvio  infusion - Symptoms during flare include mild cramping, bloating, and occasional hematochezia - Bowel movements typically two to three times per day, formed, not watery - Symptom exacerbation as time for next Entyvio  infusion approaches  Gastrointestinal motility and medication use - Occasional constipation, which can contribute to flare-ups - Uses Tylenol  for cramping - Previously used dicyclomine  for spasms, but not recently - Has used Miralax  in the past to manage constipation  Fatigue and functional status - Fatigue attributed to physically demanding work at [employer] - Desires to find different employment due to work-related fatigue  Weight fluctuations - Weight fluctuates, described as 'always goes up and down'  Micronutrient deficiencies - History of iron deficiency, B12 deficiency, and vitamin D  deficiency, previously monitored    GI Hx: Colonoscopy 12/26/21 - Mucosal ulceration in distal rectum. Biopsied. - Decreased mucosa vascular pattern in the sigmoid colon, in the descending colon, in the transverse colon, in the ascending colon and in the cecum. - Non-bleeding internal hemorrhoids. Surgical [P], colon, rectum CHRONIC ACTIVE COLITIS/PROCTITIS WITH CRYPTITIS (SEE MICROSCOPIC COMMENT) NEGATIVE FOR DYSPLASIA AND GRANULOMAS   She was in clinical remission on Humira , self  discontinued after she became pregnant, restarted in Nov 2020 and she stopped it again after 3 months as she thought it was not helping   Colonoscopy Apr 10, 2018 negative for active colitis, chronic mucosal changes with altered vascularity and mild erythema.  Random biopsies negative for active inflammation.   Drug trough 5.9 with undetectable antibody for Humira    She was started on Humira  March 2017   Colonoscopy June 2015 showed moderately severe left-sided colitis   Allergic reaction to Remicade  in 2016.  Elevated antibody level to infliximab  1622 and undetectable drug level   Flexible sigmoidoscopy January 2017 showed severe colitis left colon with ulceration, biopsies showed moderately active colitis.    Outpatient Encounter Medications as of 07/10/2024  Medication Sig   diphenhydrAMINE  (BENADRYL ) 50 MG/ML injection Inject 50 mg into the muscle as directed.   nitrofurantoin , macrocrystal-monohydrate, (MACROBID ) 100 MG capsule Take 1 capsule (100 mg total) by mouth 2 (two) times daily.   sodium chloride  0.9 % SOLN Inject into the vein as needed (Infuse intravenously Once every eight weeks over 30 minutes. Add vedolizumab  (Entyvio ) 300 mg (5 mL). Mix gently prior to administration. Storage: Room Temperature. Medication must be added to this container. Added By: Date/Time).  Infuse intravenously Once every eight weeks over 30 minutes. Add vedolizumab  (Entyvio ) 300 mg (5 mL). Mix gently prior to administration. Storage: Room Temperature. Medication must be added to this container. Added By: Date/Time   sodium chloride  flush 0.9 % SOLN injection Inject 10 mLs into the vein as needed (Inject 10 mL as directed as needed for line care. Flush using the SAS (saline, administer IV, saline) method as directed. Use syringe one time only then discard syringe. *Saline is another name for Sodium Chloride  0.9%*  Storage: Room Temperature.).   vedolizumab  (ENTYVIO ) 300 MG injection Administer as infusion  over 30 minutes every 8 weeks   Water  For Injection Sterile (STERILE WATER , PRESERVATIVE FREE,) injection Use as directed for reconstituting vedolizumab  (Entyvio ) vials. Storage: Room Temperature.   zolpidem  (AMBIEN ) 10 MG tablet Take 10 mg by mouth at bedtime as needed.   [DISCONTINUED] amoxicillin -clavulanate (AUGMENTIN ) 875-125 MG tablet Take 1 tablet by mouth every 12 (twelve) hours.   No facility-administered encounter medications on file as of 07/10/2024.    Allergies as of 07/10/2024 - Review Complete 07/10/2024  Allergen Reaction Noted   Latex Rash 05/20/2009   Remicade  [infliximab ] Itching 08/07/2015    Past Medical History:  Diagnosis Date   Anxiety    Blood transfusion without reported diagnosis    Headache    Iron Deficiency Anemia 2008   s/p transfusions 2009, 2010; inconsistent compliance with PO iron.    Non compliance w medication regimen 06/2015   stopped Remicade , may not have been taking Azulfidine .   Seasonal allergies    Thyrotoxicosis    Ulcerative Colitis 2007   on Humira     Past Surgical History:  Procedure Laterality Date   CESAREAN SECTION N/A 12/09/2013   Procedure: Primary Cesarean Section Delivery Baby Girl @ 1959, Apgars 8/8;  Surgeon: Nathanel LELON Bunker, MD;  Location: [facility];  Service: Obstetrics;  Laterality: N/A;   CESAREAN SECTION N/A 08/14/2017   Procedure: CESAREAN SECTION;  Surgeon: Bunker Nathanel, MD;  Location: [facility];  Service: Obstetrics;  Laterality: N/A;   CESAREAN SECTION N/A 03/23/2020   Procedure: CESAREAN SECTION;  Surgeon: Herchel Gloris LABOR, MD;  Location: [facility];  Service: Obstetrics;  Laterality: N/A;   COLONOSCOPY  01/19/2011   Dr Lennard. no active UC   COLONOSCOPY  2019   KN-prep exc-   COLONOSCOPY W/ BIOPSIES N/A 04/2014   Dr Debrah: Left sided colitis from prox descending to rectum; no inflammation at or proximal to splenic flexure   DILATION AND EVACUATION  08/21/2012   Procedure: DILATATION AND  EVACUATION;  Surgeon: Nathanel LELON Bunker, MD;  Location: [facility];  Service: Gynecology   TONSILLECTOMY      Family History  Problem Relation Age of Onset   Miscarriages / Stillbirths Other    Diabetes Sister    Hypotension Neg Hx    Alcohol abuse Neg Hx    Arthritis Neg Hx    Asthma Neg Hx    Birth defects Neg Hx    COPD Neg Hx    Depression Neg Hx    Drug abuse Neg Hx    Hearing loss Neg Hx    Heart disease Neg Hx    Hyperlipidemia Neg Hx    Hypertension Neg Hx    Kidney disease Neg Hx    Learning disabilities Neg Hx    Mental illness Neg Hx    Mental retardation Neg Hx    Colon cancer Neg Hx    Colon polyps Neg Hx    Esophageal cancer Neg Hx    Stomach cancer Neg Hx    Rectal cancer Neg Hx     Social History   Socioeconomic History   Marital status: Single    Spouse name: Not on file   Number of children: 4   Years of education: Not on file   Highest education level: Not on file  Occupational History   Occupation: unemployed  Tobacco Use   Smoking status: Former    Current packs/day: 0.00  Types: Cigarettes    Start date: 12/14/2015    Quit date: 12/13/2018    Years since quitting: 5.5   Smokeless tobacco: Never   Tobacco comments:    smokes a cig every blue moon  Vaping Use   Vaping status: Never Used  Substance and Sexual Activity   Alcohol use: Not Currently    Comment: special occasions   Drug use: No   Sexual activity: Yes    Birth control/protection: None  Other Topics Concern   Not on file  Social History Narrative   Not on file   Social Drivers of Health   Financial Resource Strain: Not on file  Food Insecurity: Not on file  Transportation Needs: Not on file  Physical Activity: Not on file  Stress: Not on file  Social Connections: Not on file  Intimate Partner Violence: Not on file      Review of systems: All other review of systems negative except as mentioned in the HPI.   Physical Exam: Vitals:   07/10/24 1014  BP:  102/62  Pulse: 62   Body mass index is 26.66 kg/m. Gen:      No acute distress HEENT:  sclera anicteric CV: s1s2 rrr, no murmur Lungs: B/l clear. Abd:      soft, non-tender; no palpable masses, no distension Ext:    No edema Neuro: alert and oriented x 3 Psych: normal mood and affect  Data Reviewed:  Reviewed labs, radiology imaging, old records and pertinent past GI work up     Assessment and Plan Assessment & Plan Ulcerative colitis Recent flare-up approximately two weeks ago, likely due to proximity to infusion schedule or dietary factors. Symptoms included mild cramping, bloating, and occasional blood in stool. Symptoms have improved post-infusion. Bowel movements are 2-3 times daily, formed, with occasional blood. No watery stools reported. - Prescribe dicyclomine  10 mg every 8 hours as needed for cramping, with the option to take two tablets for severe cramps. - Order fecal calprotectin test to assess colitis activity. - Perform blood work including CBC, CMP, CRP, ESR, iron levels, B12, and vitamin D . - Recommend Miralax  at bedtime as needed for constipation.  Iron deficiency Iron deficiency noted in past medical history. Current iron levels to be assessed with blood work. - Order blood test to check iron levels and ferritin.  Vitamin B12 deficiency Vitamin B12 deficiency to be evaluated with blood work. - Order blood test to check vitamin B12 levels.  Vitamin D  deficiency Vitamin D  deficiency to be evaluated with blood work. - Order blood test to check vitamin D  levels.   Return in 6 months for follow-up  This visit required >30 minutes of patient care (this includes precharting, chart review, review of results, face-to-face time used for counseling as well as treatment plan and follow-up. The patient was provided an opportunity to ask questions and all were answered. The patient agreed with the plan and demonstrated an understanding of the instructions.  Joyce Solomon , MD    CC: Shelda Atlas, MD

## 2024-07-15 LAB — QUANTIFERON-TB GOLD PLUS
Mitogen-NIL: >10 [IU]/mL
NIL: 0.02 [IU]/mL
QuantiFERON-TB Gold Plus: NEGATIVE
TB1-NIL: 0 [IU]/mL
TB2-NIL: 0 [IU]/mL

## 2024-08-19 ENCOUNTER — Emergency Department (HOSPITAL_COMMUNITY): Admission: EM | Admit: 2024-08-19 | Discharge: 2024-08-20 | Discharge status: Against Medical Advice | Payer: Self-pay

## 2024-08-19 ENCOUNTER — Encounter (HOSPITAL_COMMUNITY): Payer: Self-pay

## 2024-08-19 ENCOUNTER — Other Ambulatory Visit: Payer: Self-pay

## 2024-08-19 DIAGNOSIS — R109 Unspecified abdominal pain: Secondary | ICD-10-CM | POA: Insufficient documentation

## 2024-08-19 DIAGNOSIS — R197 Diarrhea, unspecified: Secondary | ICD-10-CM | POA: Insufficient documentation

## 2024-08-19 DIAGNOSIS — G43909 Migraine, unspecified, not intractable, without status migrainosus: Secondary | ICD-10-CM | POA: Insufficient documentation

## 2024-08-19 DIAGNOSIS — Z5321 Procedure and treatment not carried out due to patient leaving prior to being seen by health care provider: Secondary | ICD-10-CM | POA: Insufficient documentation

## 2024-08-19 LAB — COMPREHENSIVE METABOLIC PANEL WITH GFR
ALT: 11 U/L (ref 0–44)
AST: 14 U/L — ABNORMAL LOW (ref 15–41)
Albumin: 3.5 g/dL (ref 3.5–5.0)
Alkaline Phosphatase: 65 U/L (ref 38–126)
Anion gap: 11 (ref 5–15)
BUN: 10 mg/dL (ref 6–20)
CO2: 19 mmol/L — ABNORMAL LOW (ref 22–32)
Calcium: 8.8 mg/dL — ABNORMAL LOW (ref 8.9–10.3)
Chloride: 108 mmol/L (ref 98–111)
Creatinine, Ser: 0.63 mg/dL (ref 0.44–1.00)
GFR, Estimated: >60 mL/min (ref >60)
Glucose, Bld: 115 mg/dL — ABNORMAL HIGH (ref 70–99)
Potassium: 4.1 mmol/L (ref 3.5–5.1)
Sodium: 138 mmol/L (ref 135–145)
Total Bilirubin: 0.4 mg/dL (ref 0.0–1.2)
Total Protein: 6.9 g/dL (ref 6.5–8.1)

## 2024-08-19 LAB — URINALYSIS, ROUTINE W REFLEX MICROSCOPIC
Bilirubin Urine: NEGATIVE
Glucose, UA: NEGATIVE mg/dL
Hgb urine dipstick: NEGATIVE
Ketones, ur: NEGATIVE mg/dL
Nitrite: NEGATIVE
Protein, ur: NEGATIVE mg/dL
Specific Gravity, Urine: 1.036 — ABNORMAL HIGH (ref 1.005–1.030)
pH: 5 (ref 5.0–8.0)

## 2024-08-19 LAB — CBC WITH DIFFERENTIAL/PLATELET
Abs Immature Granulocytes: 0.03 K/uL (ref 0.00–0.07)
Basophils Absolute: 0 K/uL (ref 0.0–0.1)
Basophils Relative: 0 %
Eosinophils Absolute: 0.4 K/uL (ref 0.0–0.5)
Eosinophils Relative: 3 %
HCT: 40.7 % (ref 36.0–46.0)
Hemoglobin: 13.2 g/dL (ref 12.0–15.0)
Immature Granulocytes: 0 %
Lymphocytes Relative: 33 %
Lymphs Abs: 3.8 K/uL (ref 0.7–4.0)
MCH: 30.8 pg (ref 26.0–34.0)
MCHC: 32.4 g/dL (ref 30.0–36.0)
MCV: 95.1 fL (ref 80.0–100.0)
Monocytes Absolute: 0.9 K/uL (ref 0.1–1.0)
Monocytes Relative: 8 %
Neutro Abs: 6.5 K/uL (ref 1.7–7.7)
Neutrophils Relative %: 56 %
Platelets: 299 K/uL (ref 150–400)
RBC: 4.28 MIL/uL (ref 3.87–5.11)
RDW: 14.1 % (ref 11.5–15.5)
WBC: 11.6 K/uL — ABNORMAL HIGH (ref 4.0–10.5)
nRBC: 0 % (ref 0.0–0.2)

## 2024-08-19 LAB — WET PREP, GENITAL
Clue Cells Wet Prep HPF POC: NONE SEEN
Sperm: NONE SEEN
Trich, Wet Prep: NONE SEEN
WBC, Wet Prep HPF POC: <10 (ref <10)
Yeast Wet Prep HPF POC: NONE SEEN

## 2024-08-19 LAB — LIPASE, BLOOD: Lipase: 47 U/L (ref 11–51)

## 2024-08-19 LAB — HCG, SERUM, QUALITATIVE: Preg, Serum: NEGATIVE

## 2024-08-19 NOTE — ED Notes (Signed)
Pt states she has to go to work and is leaving

## 2024-08-19 NOTE — ED Triage Notes (Signed)
 Patient states she has been having migraines and period like cramping for the past week. She took tylenol  and goodie powder for her headache and states that it did help. No vomiting, and some nausea and diarrhea. Has history of migraines. Denies visual changes, numbness and tingling.

## 2024-08-19 NOTE — ED Triage Notes (Signed)
 Pt c/o HA and abd cramping x 1 week; hx migraine; denies n/v, endorses some diarrhea; denies fevers

## 2024-08-19 NOTE — ED Provider Triage Note (Cosign Needed)
 Emergency Medicine Provider Triage Evaluation Note  Joyce Solomon , a 38 y.o. female  was evaluated in triage.  Pt complains of headaches in the right forehead, as well as pelvic cramping for the past week.  No relief w/ OTC analgesia.  Hx of migraine h/a.  Endorses occasional increased vaginal d/c that has an incresaed odor, no vaginal bleeding.  D/t start her menstruation in the next week.  Tolerating PO intake well, denies n/v, has had occasional loose stools..  Review of Systems  Positive: As above Negative:   Physical Exam  BP 113/62   Pulse 76   Temp 98.3 F (36.8 C) (Oral)   Resp 16   Ht 4' 11 (1.499 m)   Wt 59 kg   LMP 01/08/2024 (Exact Date)   SpO2 100%   BMI 26.26 kg/m  Gen:   Awake, no distress   Resp:  Normal effort  MSK:   Moves extremities without difficulty  Other:    Medical Decision Making  Medically screening exam initiated at 4:03 PM.  Appropriate orders placed.  ROISIN MONES was informed that the remainder of the evaluation will be completed by another provider, this initial triage assessment does not replace that evaluation, and the importance of remaining in the ED until their evaluation is complete.  Abd pain orders placed as well as self swab for GYN etiology.   Myriam Dorn BROCKS, [place] 08/19/24 [phone]

## 2024-08-20 LAB — GC/CHLAMYDIA PROBE AMP (~~LOC~~) NOT AT ARMC
Chlamydia: NEGATIVE
Comment: NEGATIVE
Comment: NORMAL
Neisseria Gonorrhea: NEGATIVE

## 2024-08-21 ENCOUNTER — Telehealth: Payer: Self-pay | Admitting: Gastroenterology

## 2024-08-21 NOTE — Telephone Encounter (Signed)
 Received a call from patient requesting call to discuss insurance, please review and advise  Thank you

## 2024-08-21 NOTE — Telephone Encounter (Signed)
 Spoke with pt. She states she no longer has Medicaid and she would like to know how to get assistance to pay for her Entyvio . Directed patient to [email or web address] website. Advised pt that this should be able to connect her the patient assistance program, but to give us  a call back if she has further issues or questions. Verbalized understanding.

## 2024-08-29 NOTE — Telephone Encounter (Signed)
 Forms for medication assistance completed except for the patient part. Called the patient. No answer. Left a very detailed message on next steps. She will need to come to the 3rd floor and pick up her part of the application. She will have to prove her income or supply last year's tax form and sign the form. Once she has this information, her application can be submitted.

## 2024-08-29 NOTE — Telephone Encounter (Signed)
 Inbound call from patient stating she would like to speak to nurse in regards to assistance on medication Entyvio . Requesting a call back  Please advise  Thank you

## 2024-08-29 NOTE — Telephone Encounter (Signed)
 We can consider switching her to subcu Entyvio  if able to get medication at lower price.  Thank you

## 2024-08-29 NOTE — Telephone Encounter (Signed)
 Patient is without insurance and due her infusion now.  I can help her get signed up for patient assistance to get Entyvio  but the infusion will still be billed. Would she be a candidate for [employer] Entyvio ?  She has applied for Medicaid which she will pursue.

## 2024-08-30 ENCOUNTER — Emergency Department (HOSPITAL_COMMUNITY)
Admission: EM | Admit: 2024-08-30 | Discharge: 2024-08-30 | Disposition: A | Discharge status: Home / Self Care | Payer: Self-pay | Attending: Emergency Medicine | Admitting: Emergency Medicine

## 2024-08-30 ENCOUNTER — Encounter (HOSPITAL_COMMUNITY): Payer: Self-pay | Admitting: Pharmacy Technician

## 2024-08-30 ENCOUNTER — Other Ambulatory Visit: Payer: Self-pay

## 2024-08-30 DIAGNOSIS — Z91040 Latex allergy status: Secondary | ICD-10-CM | POA: Insufficient documentation

## 2024-08-30 DIAGNOSIS — T7840XA Allergy, unspecified, initial encounter: Secondary | ICD-10-CM

## 2024-08-30 DIAGNOSIS — H01114 Allergic dermatitis of left upper eyelid: Secondary | ICD-10-CM | POA: Insufficient documentation

## 2024-08-30 MED ORDER — PREDNISONE 20 MG PO TABS
60.0000 mg | ORAL_TABLET | Freq: Once | ORAL | Status: AC
  Administered 2024-08-30: 60 mg via ORAL
  Filled 2024-08-30: qty 3

## 2024-08-30 MED ORDER — TETRACAINE HCL 0.5 % OP SOLN
1.0000 [drp] | Freq: Once | OPHTHALMIC | Status: AC
  Administered 2024-08-30: 1 [drp] via OPHTHALMIC
  Filled 2024-08-30: qty 4

## 2024-08-30 MED ORDER — FAMOTIDINE 20 MG PO TABS
40.0000 mg | ORAL_TABLET | Freq: Once | ORAL | Status: AC
  Administered 2024-08-30: 40 mg via ORAL
  Filled 2024-08-30: qty 2

## 2024-08-30 MED ORDER — ERYTHROMYCIN 5 MG/GM OP OINT: TOPICAL_OINTMENT | OPHTHALMIC | 0 refills | Status: DC

## 2024-08-30 MED ORDER — DIPHENHYDRAMINE HCL 25 MG PO CAPS
50.0000 mg | ORAL_CAPSULE | Freq: Once | ORAL | Status: AC
  Administered 2024-08-30: 50 mg via ORAL
  Filled 2024-08-30: qty 2

## 2024-08-30 MED ORDER — PREDNISONE 20 MG PO TABS: 40.0000 mg | ORAL_TABLET | Freq: Every day | ORAL | 0 refills | Status: DC

## 2024-08-30 MED ORDER — FLUORESCEIN SODIUM 1 MG OP STRP
1.0000 | ORAL_STRIP | Freq: Once | OPHTHALMIC | Status: AC
  Administered 2024-08-30: 1 via OPHTHALMIC
  Filled 2024-08-30: qty 1

## 2024-08-30 NOTE — ED Triage Notes (Signed)
 Pt here with eye swelling after getting lashes done yesterday. No wheezing, no oral swelling.

## 2024-08-30 NOTE — ED Provider Notes (Signed)
 [place] EMERGENCY DEPARTMENT AT [facility] Provider Note   CSN: [id number] Arrival date & time: 08/30/24  9098     Patient presents with: Allergic Reaction   Joyce Solomon is a 38 y.o. female with past medical history of ulcerative colitis presents to emergency room with complaint of eye swelling.  Patient reports that she got her eyelashes done yesterday and started having irritation where the glue was.  She was able to remove the lashes however has had some eye swelling and irritation since then.  She denies any other symptoms.  She has not had any vision change trouble breathing or rash.    Allergic Reaction      Prior to Admission medications   Medication Sig Start Date End Date Taking? Authorizing Provider  dicyclomine  (BENTYL ) 10 MG capsule Take 1 capsule (10 mg total) by mouth every 8 (eight) hours as needed for spasms. 07/10/24   Nandigam, Kavitha V, MD  nitrofurantoin , macrocrystal-monohydrate, (MACROBID ) 100 MG capsule Take 1 capsule (100 mg total) by mouth 2 (two) times daily. 06/27/24   Mayer, Jodi R, NP  vedolizumab  (ENTYVIO ) 300 MG injection Administer as infusion over 30 minutes every 8 weeks 10/08/23   Nandigam, Kavitha V, MD  zolpidem  (AMBIEN ) 10 MG tablet Take 10 mg by mouth at bedtime as needed. 11/21/21   [provider]    Allergies: Latex and Remicade  [infliximab ]    Review of Systems  Eyes:  Positive for itching.    Updated Vital Signs BP (!) 157/135 (BP Location: Left Arm)   Pulse 87   Temp 98.4 F (36.9 C)   Resp 19   LMP 01/08/2024 (Exact Date)   SpO2 100%   Physical Exam Vitals and nursing note reviewed.  Constitutional:      General: She is not in acute distress.    Appearance: She is not toxic-appearing.  HENT:     Head: Normocephalic and atraumatic.  Eyes:     General: No scleral icterus.    Conjunctiva/sclera: Conjunctivae normal.     Comments: Patient has mild upper eyelid swelling. No increased uptake seen  on fluorescein dye exam.  Pupils equal and reactive.  Cardiovascular:     Rate and Rhythm: Normal rate and regular rhythm.     Pulses: Normal pulses.     Heart sounds: Normal heart sounds.  Pulmonary:     Effort: Pulmonary effort is normal. No respiratory distress.     Breath sounds: Normal breath sounds.  Abdominal:     General: Abdomen is flat. Bowel sounds are normal.     Palpations: Abdomen is soft.     Tenderness: There is no abdominal tenderness.  Skin:    General: Skin is warm and dry.     Findings: No lesion.  Neurological:     General: No focal deficit present.     Mental Status: She is alert and oriented to person, place, and time. Mental status is at baseline.     (all labs ordered are listed, but only abnormal results are displayed) Labs Reviewed - No data to display  EKG: None  Radiology: No results found.   Procedures   Medications Ordered in the ED  diphenhydrAMINE  (BENADRYL ) capsule 50 mg (50 mg Oral Given 08/30/24 1050)  famotidine  (PEPCID ) tablet 40 mg (40 mg Oral Given 08/30/24 1050)  predniSONE  (DELTASONE ) tablet 60 mg (60 mg Oral Given 08/30/24 1050)  Medical Decision Making Risk Prescription drug management.   This patient presents to the ED for concern of allergic reaction, this involves an extensive number of treatment options, and is a complaint that carries with it a high risk of complications and morbidity.  The differential diagnosis includes glaucoma, corneal abrasion, corneal ulcer, cellulitis, allergic reaction, anaphylaxis    Cardiac Monitoring: / EKG:  The patient was maintained on a cardiac monitor.     Problem List / ED Course / Critical interventions / Medication management  Patient is presenting to emergency room with eye swelling, itching.  This happened after having eyelid glue placed on upper eyelids.  Patient hemodynamically stable and well-appearing.  Exam is not consistent with  anaphylaxis.  Pupils are equal and reactive.  I did do fluorescein dye exam which was normal.  Will treat like an allergic reaction. I ordered medication including prednisone , famotidine , Benadryl  Reevaluation of the patient after these medicines showed that the patient improved I have reviewed the patients home medicines and have made adjustments as needed Patient's exam is consistent with a mild allergic reaction.  It is not consistent with anaphylaxis.  Vitals have been stable here.  Feel she is appropriate for discharge with outpatient follow-up.        Final diagnoses:  Allergic reaction, initial encounter    ED Discharge Orders          Ordered    predniSONE  (DELTASONE ) 20 MG tablet  Daily        08/30/24 1430    erythromycin  ophthalmic ointment        08/30/24 1430               Shanetta Nicolls, Warren SAILOR, PA-C 08/30/24 1433    Tegeler, Lonni PARAS, MD 08/30/24 1511

## 2024-08-30 NOTE — ED Notes (Signed)
 pt called out to report her symptoms haven't improved and its hard for her to open her eyes from the swelling. PA-C notified.

## 2024-08-30 NOTE — Discharge Instructions (Signed)
 Please follow-up with primary care for recheck.  I sent medications to your pharmacy please take as prescribed return to emergency room with new or worsening symptoms.

## 2024-09-08 ENCOUNTER — Telehealth: Payer: Self-pay | Admitting: Gastroenterology

## 2024-09-08 NOTE — Telephone Encounter (Signed)
 Inbound call from patient requesting to speak to beth in regards to a letter that she received. Please advise.

## 2024-09-09 NOTE — Telephone Encounter (Signed)
 Called the patient. Left a message of my call.

## 2024-09-09 NOTE — Telephone Encounter (Signed)
Called the patient. No answer. Left her a message of my return call.

## 2024-09-10 NOTE — Telephone Encounter (Signed)
 Spoke with the patient. She will come in today around 2 pm to give me the financial documents she has for the patient assistance program.  Will try to get Entyvio  injections for the patient.

## 2024-09-12 NOTE — Telephone Encounter (Signed)
 Spoke with Velma Limes, Animal nutritionist for [facility] patient assistance. He said it appears the patient is approved. She will receive a phone call from her case manager to confirm her finacial hardship and then next steps. I have called the patient and left her this information on her voicemail.

## 2024-09-12 NOTE — Telephone Encounter (Signed)
 Spoke with the [facility] and confirmed the Entyvio  prescription as per medication list. A prescription form will be faxed for the doctor to sign and return.

## 2024-09-26 ENCOUNTER — Inpatient Hospital Stay (HOSPITAL_COMMUNITY)
Admission: AD | Admit: 2024-09-26 | Discharge: 2024-09-26 | Discharge status: Against Medical Advice | Payer: Self-pay | Attending: Family Medicine | Admitting: Family Medicine

## 2024-09-26 ENCOUNTER — Encounter (HOSPITAL_COMMUNITY): Payer: Self-pay

## 2024-09-26 ENCOUNTER — Other Ambulatory Visit: Payer: Self-pay

## 2024-09-26 DIAGNOSIS — Z3A00 Weeks of gestation of pregnancy not specified: Secondary | ICD-10-CM | POA: Diagnosis not present

## 2024-09-26 DIAGNOSIS — O26891 Other specified pregnancy related conditions, first trimester: Secondary | ICD-10-CM | POA: Diagnosis present

## 2024-09-26 DIAGNOSIS — R109 Unspecified abdominal pain: Secondary | ICD-10-CM | POA: Diagnosis not present

## 2024-09-26 DIAGNOSIS — Z5321 Procedure and treatment not carried out due to patient leaving prior to being seen by health care provider: Secondary | ICD-10-CM | POA: Diagnosis not present

## 2024-09-26 LAB — COMPREHENSIVE METABOLIC PANEL WITH GFR
ALT: 12 U/L (ref 0–44)
AST: 12 U/L — ABNORMAL LOW (ref 15–41)
Albumin: 3.6 g/dL (ref 3.5–5.0)
Alkaline Phosphatase: 60 U/L (ref 38–126)
Anion gap: 10 (ref 5–15)
BUN: 14 mg/dL (ref 6–20)
CO2: 20 mmol/L — ABNORMAL LOW (ref 22–32)
Calcium: 8.6 mg/dL — ABNORMAL LOW (ref 8.9–10.3)
Chloride: 105 mmol/L (ref 98–111)
Creatinine, Ser: 0.56 mg/dL (ref 0.44–1.00)
GFR, Estimated: >60 mL/min (ref >60)
Glucose, Bld: 99 mg/dL (ref 70–99)
Potassium: 4.7 mmol/L (ref 3.5–5.1)
Sodium: 135 mmol/L (ref 135–145)
Total Bilirubin: 0.4 mg/dL (ref 0.0–1.2)
Total Protein: 7.2 g/dL (ref 6.5–8.1)

## 2024-09-26 LAB — CBC
HCT: 38.8 % (ref 36.0–46.0)
Hemoglobin: 13 g/dL (ref 12.0–15.0)
MCH: 31.2 pg (ref 26.0–34.0)
MCHC: 33.5 g/dL (ref 30.0–36.0)
MCV: 93 fL (ref 80.0–100.0)
Platelets: 341 K/uL (ref 150–400)
RBC: 4.17 MIL/uL (ref 3.87–5.11)
RDW: 13.8 % (ref 11.5–15.5)
WBC: 12 K/uL — ABNORMAL HIGH (ref 4.0–10.5)
nRBC: 0 % (ref 0.0–0.2)

## 2024-09-26 LAB — URINALYSIS, ROUTINE W REFLEX MICROSCOPIC
Bilirubin Urine: NEGATIVE
Glucose, UA: NEGATIVE mg/dL
Hgb urine dipstick: NEGATIVE
Ketones, ur: NEGATIVE mg/dL
Leukocytes,Ua: NEGATIVE
Nitrite: NEGATIVE
Protein, ur: NEGATIVE mg/dL
Specific Gravity, Urine: 1.026 (ref 1.005–1.030)
pH: 6 (ref 5.0–8.0)

## 2024-09-26 LAB — POCT PREGNANCY, URINE: Preg Test, Ur: POSITIVE — AB

## 2024-09-26 LAB — HCG, QUANTITATIVE, PREGNANCY: hCG, Beta Chain, Quant, S: 19220 m[IU]/mL — ABNORMAL HIGH (ref <5)

## 2024-09-26 NOTE — MAU Note (Signed)
 MAU registration informed RN that pt left AMA - form signed.

## 2024-09-26 NOTE — MAU Note (Signed)
 Joyce Solomon is a 38 y.o. at Unknown here in MAU reporting: recent positive home pregnancy test two weeks ago. Here today for lower abdominal pain that started last week with headaches, back pain, and nausea. Has been taking Goody Powders.  LMP: 08/22/2024 Onset of complaint: 09/19/2024 Pain score: 7/10, lower abdominal pain Vitals:   09/26/24 2112  BP: 125/68  Pulse: 86  Resp: 16  Temp: 98.9 F (37.2 C)  SpO2: 99%     Lab orders placed from triage: POCT Pregnancy Test

## 2024-10-07 ENCOUNTER — Other Ambulatory Visit: Payer: Self-pay

## 2024-10-07 ENCOUNTER — Encounter: Payer: Self-pay | Admitting: General Practice

## 2024-10-07 ENCOUNTER — Ambulatory Visit (INDEPENDENT_AMBULATORY_CARE_PROVIDER_SITE_OTHER): Payer: Self-pay | Admitting: General Practice

## 2024-10-07 VITALS — BP 124/61 | HR 78 | Ht 59.0 in | Wt 140.0 lb

## 2024-10-07 DIAGNOSIS — Z3A01 Less than 8 weeks gestation of pregnancy: Secondary | ICD-10-CM

## 2024-10-07 DIAGNOSIS — O219 Vomiting of pregnancy, unspecified: Secondary | ICD-10-CM

## 2024-10-07 MED ORDER — PROMETHAZINE HCL 25 MG PO TABS: 25.0000 mg | ORAL_TABLET | Freq: Four times a day (QID) | ORAL | 0 refills | Status: AC | PRN

## 2024-10-07 NOTE — Progress Notes (Signed)
 Patient presents to office today for pregnancy confirmation. Had + bhcg in MAU on 11/7. LMP 08/22/24 EDD 05/29/25 [redacted]w[redacted]d. Reports previously having regular periods the past couple months, although the October period was a little strange. Had negative serum HCG 08/19/24. Allergies/meds reviewed- entyvio  okay per Dr Lola. Patient would like something sent in for nausea. Phenergan  sent in per protocol. Recommended she start PNV. Will go to [facility] for OB care.   Elenor DEL RN BSN 10/07/24

## 2024-10-21 ENCOUNTER — Other Ambulatory Visit: Payer: Self-pay

## 2024-10-21 ENCOUNTER — Ambulatory Visit: Payer: Self-pay | Admitting: *Deleted

## 2024-10-21 VITALS — BP 120/78 | HR 92 | Wt 144.6 lb

## 2024-10-21 DIAGNOSIS — Z3A08 8 weeks gestation of pregnancy: Secondary | ICD-10-CM | POA: Diagnosis not present

## 2024-10-21 DIAGNOSIS — O3680X0 Pregnancy with inconclusive fetal viability, not applicable or unspecified: Secondary | ICD-10-CM

## 2024-10-21 DIAGNOSIS — O0991 Supervision of high risk pregnancy, unspecified, first trimester: Secondary | ICD-10-CM | POA: Diagnosis not present

## 2024-10-21 DIAGNOSIS — O0990 Supervision of high risk pregnancy, unspecified, unspecified trimester: Secondary | ICD-10-CM

## 2024-10-21 MED ORDER — DOXYLAMINE-PYRIDOXINE 10-10 MG PO TBEC: 2.0000 | DELAYED_RELEASE_TABLET | Freq: Every day | ORAL | 5 refills | Status: AC

## 2024-10-21 MED ORDER — BLOOD PRESSURE KIT DEVI: 1.0000 | 0 refills | Status: AC

## 2024-10-21 MED ORDER — VITAFOL GUMMIES 3.33-0.333-34.8 MG PO CHEW: 3.0000 | CHEWABLE_TABLET | Freq: Every day | ORAL | 11 refills | Status: AC

## 2024-10-21 NOTE — Patient Instructions (Signed)

## 2024-10-21 NOTE — Progress Notes (Signed)
 New OB Intake  I connected with Joyce Solomon  on 10/21/24 at  9:15 AM EST by In Person Visit and verified that I am speaking with the correct person using two identifiers. Nurse is located at [facility] and pt is located at [place].  I discussed the limitations, risks, security and privacy concerns of performing an evaluation and management service by telephone and the availability of in person appointments. I also discussed with the patient that there may be a patient responsible charge related to this service. The patient expressed understanding and agreed to proceed.  I explained I am completing New OB Intake today. We discussed EDD of 05/29/25 based on LMP of 08/22/24. Pt is [id number]. I reviewed her allergies, medications and Medical/Surgical/OB history.    Patient Active Problem List   Diagnosis Date Noted   Subclinical hyperthyroidism 08/12/2020   C. difficile diarrhea 03/24/2020   History of 2 cesarean sections 03/24/2020   Anal fissure 09/11/2018   Rectal bleeding 04/23/2014   Prior pregnancy complicated by SGA (small for gestational age), antepartum 12/09/2013   DENTAL CARIES 12/22/2009   Thyrotoxicosis 08/03/2008   Constipation 07/29/2008   ACNE, MILD 07/29/2008   Anemia 06/30/2008   Other ulcerative colitis without complications (HCC) 11/20/2005     Concerns addressed today  Delivery Plans Plans to deliver at [facility] Discussed the nature of our practice with multiple providers including residents and students as well as female and female providers. Due to the size of the practice, the delivering provider may not be the same as those providing prenatal care.   MyChart/Babyscripts MyChart access verified. I explained pt will have some visits in office and some virtually. Babyscripts instructions given and order placed. Patient verifies receipt of registration text/e-mail. Account successfully created and app downloaded. If patient is a candidate for Optimized scheduling, add  to sticky note.   Blood Pressure Cuff/Weight Scale Blood pressure cuff ordered for patient to pick-up from [employer]. Explained after first prenatal appt pt will check weekly and document in Babyscripts. Patient does not have weight scale; patient may purchase if they desire to track weight weekly in Babyscripts.  Anatomy US  Explained first scheduled US  will be around 19 weeks. Anatomy US  scheduled for TBD at TBD.  Is patient a candidate for Babyscripts Optimization? No, due to Risk Factors   First visit review I reviewed new OB appt with patient. Explained pt will be seen by Nidia Daring, NP at first visit. Discussed [place] genetic screening with patient. Requests Panorama. Routine prenatal labs OB Urine collected at today's visit. Initial prenatal labs deferred to New OB appt.   Last Pap Diagnosis  Date Value Ref Range Status  09/17/2019   Final   - Negative for intraepithelial lesion or malignancy (NILM)    Rocky CHRISTELLA Ober, RN 10/21/2024  9:37 AM

## 2024-10-23 LAB — URINE CULTURE, OB REFLEX

## 2024-10-23 LAB — CULTURE, OB URINE

## 2024-10-28 ENCOUNTER — Telehealth: Payer: Self-pay

## 2024-10-28 NOTE — Telephone Encounter (Signed)
 Pt called office this AM due to continued nausea and vomiting. She has tried the diclegis  given on 12/2 with no relief. She had previously been given phenergan  and this did not help either. She is having 2-3 episodes vomiting per day. She notes able to drink smaller amounts of water , but cannot keep any solids down.  Next visit in office 12/31 Please advise any other medications

## 2024-11-19 ENCOUNTER — Encounter: Payer: Self-pay | Admitting: Obstetrics and Gynecology

## 2024-11-19 ENCOUNTER — Ambulatory Visit: Admitting: Obstetrics and Gynecology

## 2024-11-19 ENCOUNTER — Other Ambulatory Visit (HOSPITAL_COMMUNITY)
Admission: RE | Admit: 2024-11-19 | Discharge: 2024-11-19 | Disposition: A | Discharge status: Home / Self Care | Source: Ambulatory Visit

## 2024-11-19 VITALS — BP 114/71 | HR 86 | Wt 153.8 lb

## 2024-11-19 DIAGNOSIS — O0990 Supervision of high risk pregnancy, unspecified, unspecified trimester: Secondary | ICD-10-CM | POA: Diagnosis present

## 2024-11-19 DIAGNOSIS — O219 Vomiting of pregnancy, unspecified: Secondary | ICD-10-CM

## 2024-11-19 DIAGNOSIS — Z3A12 12 weeks gestation of pregnancy: Secondary | ICD-10-CM | POA: Diagnosis not present

## 2024-11-19 DIAGNOSIS — O09291 Supervision of pregnancy with other poor reproductive or obstetric history, first trimester: Secondary | ICD-10-CM

## 2024-11-19 DIAGNOSIS — O09299 Supervision of pregnancy with other poor reproductive or obstetric history, unspecified trimester: Secondary | ICD-10-CM

## 2024-11-19 DIAGNOSIS — O0991 Supervision of high risk pregnancy, unspecified, first trimester: Secondary | ICD-10-CM | POA: Diagnosis not present

## 2024-11-19 DIAGNOSIS — Z98891 History of uterine scar from previous surgery: Secondary | ICD-10-CM | POA: Diagnosis not present

## 2024-11-19 MED ORDER — ASPIRIN 81 MG PO TBEC: 81.0000 mg | DELAYED_RELEASE_TABLET | Freq: Every day | ORAL | 2 refills | Status: AC

## 2024-11-19 MED ORDER — ONDANSETRON 4 MG PO TBDP: 4.0000 mg | ORAL_TABLET | Freq: Four times a day (QID) | ORAL | 2 refills | Status: AC | PRN

## 2024-11-19 NOTE — Progress Notes (Signed)
 Pt presents for NOB visit. Pt states nausea meds not working. Wants to defer PAP

## 2024-11-19 NOTE — Progress Notes (Signed)
 "  INITIAL PRENATAL VISIT  Subjective:   Joyce Solomon is being seen today for her first obstetrical visit.  She is at [redacted]w[redacted]d gestation by LMP. Her obstetrical history is significant for AMA, hx of cesarean section. Relationship with FOB: involved. Patient both intend to breast feed and bottle. Pregnancy history fully reviewed.  Patient reports nausea.   Pap smear history: 09/17/2019  Objective:    Obstetric History OB History  Gravida Para Term Preterm AB Living  8 4 4  0 3 4  SAB IAB Ectopic Multiple Live Births  2 1 0 0 4    # Outcome Date GA Lbr Len/2nd Weight Sex Type Anes PTL Lv  8 Current           7 SAB 2025          6 Term 03/23/20 [redacted]w[redacted]d  6 lb 1.6 oz (2.767 kg) F CS-LTranv Spinal  LIV     Birth Comments: wnl  5 Term 08/14/17 [redacted]w[redacted]d  8 lb 2.9 oz (3.71 kg) M CS-LVertical EPI  LIV  4 Term 12/09/13 [redacted]w[redacted]d  5 lb 14.5 oz (2.68 kg) F CS-LTranv EPI  LIV  3 Term 10/21/11 [redacted]w[redacted]d 20:42 / 00:33 5 lb 7.8 oz (2.489 kg) F Vag-Spont EPI  LIV     Birth Comments: no dysmorphic features; apnea secondary to tight nuchal cord and acute vascular depletion; IUGR not evident clinically; see consult note.  2 IAB           1 SAB             Past Medical History:  Diagnosis Date   Anxiety    Blood transfusion without reported diagnosis    Headache    Iron Deficiency Anemia 2008   s/p transfusions 2009, 2010; inconsistent compliance with PO iron.    Non compliance w medication regimen 06/2015   stopped Remicade , may not have been taking Azulfidine .   Seasonal allergies    Thyrotoxicosis    Ulcerative Colitis 2007   on Humira     Past Surgical History:  Procedure Laterality Date   CESAREAN SECTION N/A 12/09/2013   Procedure: Primary Cesarean Section Delivery Baby Girl @ 1960, Apgars 8/8;  Surgeon: Nathanel LELON Bunker, MD;  Location: [facility] ORS;  Service: Obstetrics;  Laterality: N/A;   CESAREAN SECTION N/A 08/14/2017   Procedure: CESAREAN SECTION;  Surgeon: Bunker Nathanel, MD;   Location: [facility]IR[facility]ING SU[facility];  Service: Obstetrics;  Laterality: N/A;   CESAREAN SECTION N/A 03/23/2020   Procedure: CESAREAN SECTION;  Surgeon: Herchel Gloris LABOR, MD;  Location: [facility] LD ORS;  Service: Obstetrics;  Laterality: N/A;   COLONOSCOPY  01/19/2011   Dr Lennard. no active UC   COLONOSCOPY  2019   KN-prep exc-   COLONOSCOPY W/ BIOPSIES N/A 04/2014   Dr Debrah: Left sided colitis from prox descending to rectum; no inflammation at or proximal to splenic flexure   DILATION AND EVACUATION  08/21/2012   Procedure: DILATATION AND EVACUATION;  Surgeon: Nathanel LELON Bunker, MD;  Location: [facility] ORS;  Service: Gynecology   TONSILLECTOMY      Medications Ordered Prior to Encounter[1]  Allergies[2]  Social History:  reports that she quit smoking about 5 years ago. Her smoking use included cigarettes. She started smoking about 8 years ago. She has never used smokeless tobacco. She reports that she does not currently use alcohol. She reports current drug use.  Family History  Problem Relation Age of Onset   Stroke Mother    Diabetes Sister  Miscarriages / Stillbirths Other    Hypotension Neg Hx    Alcohol abuse Neg Hx    Arthritis Neg Hx    Asthma Neg Hx    Birth defects Neg Hx    COPD Neg Hx    Depression Neg Hx    Drug abuse Neg Hx    Hearing loss Neg Hx    Heart disease Neg Hx    Hyperlipidemia Neg Hx    Hypertension Neg Hx    Kidney disease Neg Hx    Learning disabilities Neg Hx    Mental illness Neg Hx    Mental retardation Neg Hx    Colon cancer Neg Hx    Colon polyps Neg Hx    Esophageal cancer Neg Hx    Stomach cancer Neg Hx    Rectal cancer Neg Hx     The following portions of the patient's history were reviewed and updated as appropriate: allergies, current medications, past family history, past medical history, past social history, past surgical history and problem list.  Review of Systems Review of Systems  All other systems reviewed and are negative.   Physical  Exam:  BP 114/71   Pulse 86   Wt 153 lb 12.8 oz (69.8 kg)   LMP 08/22/2024 (Exact Date)   BMI 31.06 kg/m  CONSTITUTIONAL: Well-developed, well-nourished female in no acute distress.  HENT:  Normocephalic, atraumatic.   SKIN: Skin is warm and dry. MUSCULOSKELETAL: Normal range of motion NEUROLOGIC: Alert and oriented  PSYCHIATRIC: Normal mood and affect. Normal behavior.  RESPIRATORY: normal effort ABDOMEN: Soft PELVIC:deferred  Fetal Heart Rate (bpm): 147   Movement: Absent       Assessment:    Pregnancy: [id number]  1. Supervision of high risk pregnancy, antepartum (Primary) BP and FHR normal Discussed ASA recommendation in pregnancy, rx sent  - Cervicovaginal ancillary only( [place]) - CBC/D/Plt+RPR+Rh+ABO+RubIgG... - HgB A1c - PANORAMA PRENATAL TEST - TSH  2. History of 3 cesarean sections She reports her first c/s was d/t growth restriction The second and third were RCS Plan for RCS   3. Prior pregnancy complicated by SGA (small for gestational age), antepartum Will follow ultrasound  4. [redacted] weeks gestation of pregnancy Deferred pap today   5. Nausea and vomiting during pregnancy -will trial zofran       Plan:     Initial labs drawn. Prenatal vitamins. Problem list reviewed and updated. Reviewed in detail the nature of the practice with collaborative care between  Genetic screening discussed: NIPS/First trimester screen/Quad/AFP requested. Role of ultrasound in pregnancy discussed; Anatomy US : requested. Discussed clinic routines, schedule of care and testing, genetic screening options, involvement of students and residents under the direct supervision of APPs and doctors and presence of female providers. Pt verbalized understanding.   Delores Nidia CROME, FNP        [1]  Current Outpatient Medications on File Prior to Visit  Medication Sig Dispense Refill   Blood Pressure Monitoring (BLOOD PRESSURE KIT) DEVI 1 Device by Does not apply route once a  week. 1 each 0   Doxylamine -Pyridoxine  (DICLEGIS ) 10-10 MG TBEC Take 2 tablets by mouth at bedtime. If symptoms persist, add one tablet in the morning and one in the afternoon 100 tablet 5   Prenatal Vit-Fe Phos-FA-Omega (VITAFOL  GUMMIES) 3.33-0.333-34.8 MG CHEW Chew 3 tablets by mouth daily. 90 tablet 11   vedolizumab  (ENTYVIO ) 300 MG injection Administer as infusion over 30 minutes every 8 weeks 1 each 6   zolpidem  (AMBIEN ) 10  MG tablet Take 10 mg by mouth at bedtime as needed.     promethazine  (PHENERGAN ) 25 MG tablet Take 1 tablet (25 mg total) by mouth every 6 (six) hours as needed for nausea or vomiting. (Patient not taking: Reported on 11/19/2024) 30 tablet 0   No current facility-administered medications on file prior to visit.  [2]  Allergies Allergen Reactions   Latex Rash   Remicade  [Infliximab ] Itching   "

## 2024-11-21 ENCOUNTER — Other Ambulatory Visit

## 2024-11-21 DIAGNOSIS — O0990 Supervision of high risk pregnancy, unspecified, unspecified trimester: Secondary | ICD-10-CM

## 2024-11-21 DIAGNOSIS — Z3A13 13 weeks gestation of pregnancy: Secondary | ICD-10-CM

## 2024-11-21 DIAGNOSIS — Z3482 Encounter for supervision of other normal pregnancy, second trimester: Secondary | ICD-10-CM

## 2024-11-21 LAB — CERVICOVAGINAL ANCILLARY ONLY
Chlamydia: NEGATIVE
Comment: NEGATIVE
Comment: NEGATIVE
Comment: NORMAL
Neisseria Gonorrhea: NEGATIVE
Trichomonas: NEGATIVE

## 2024-11-22 LAB — CBC/D/PLT+RPR+RH+ABO+RUBIGG...
Antibody Screen: NEGATIVE
Basophils Absolute: 0 x10E3/uL (ref 0.0–0.2)
Basos: 0 %
EOS (ABSOLUTE): 0.1 x10E3/uL (ref 0.0–0.4)
Eos: 1 %
HCV Ab: NONREACTIVE
HIV Screen 4th Generation wRfx: NONREACTIVE
Hematocrit: 38.1 % (ref 34.0–46.6)
Hemoglobin: 12.7 g/dL (ref 11.1–15.9)
Hepatitis B Surface Ag: NEGATIVE
Immature Grans (Abs): 0 x10E3/uL (ref 0.0–0.1)
Immature Granulocytes: 0 %
Lymphocytes Absolute: 2.8 x10E3/uL (ref 0.7–3.1)
Lymphs: 24 %
MCH: 31.2 pg (ref 26.6–33.0)
MCHC: 33.3 g/dL (ref 31.5–35.7)
MCV: 94 fL (ref 79–97)
Monocytes Absolute: 1 x10E3/uL — ABNORMAL HIGH (ref 0.1–0.9)
Monocytes: 9 %
Neutrophils Absolute: 7.4 x10E3/uL — ABNORMAL HIGH (ref 1.4–7.0)
Neutrophils: 66 %
Platelets: 247 x10E3/uL (ref 150–450)
RBC: 4.07 x10E6/uL (ref 3.77–5.28)
RDW: 13.2 % (ref 11.7–15.4)
RPR Ser Ql: NONREACTIVE
Rh Factor: POSITIVE
Rubella Antibodies, IGG: 1.91 {index}
WBC: 11.4 x10E3/uL — ABNORMAL HIGH (ref 3.4–10.8)

## 2024-11-22 LAB — TSH: TSH: 0.535 u[IU]/mL (ref 0.450–4.500)

## 2024-11-22 LAB — HEMOGLOBIN A1C
Est. average glucose Bld gHb Est-mCnc: 105 mg/dL
Hgb A1c MFr Bld: 5.3 % (ref 4.8–5.6)

## 2024-11-22 LAB — HCV INTERPRETATION

## 2024-11-24 ENCOUNTER — Ambulatory Visit: Payer: Self-pay | Admitting: Obstetrics and Gynecology

## 2024-11-24 DIAGNOSIS — O0990 Supervision of high risk pregnancy, unspecified, unspecified trimester: Secondary | ICD-10-CM

## 2024-11-27 LAB — PANORAMA PRENATAL TEST FULL PANEL:PANORAMA TEST PLUS 5 ADDITIONAL MICRODELETIONS: FETAL FRACTION: 13.7

## 2024-12-02 ENCOUNTER — Inpatient Hospital Stay (HOSPITAL_COMMUNITY)
Admission: AD | Admit: 2024-12-02 | Discharge: 2024-12-02 | Disposition: A | Discharge status: Home / Self Care | Attending: Obstetrics and Gynecology | Admitting: Obstetrics and Gynecology

## 2024-12-02 ENCOUNTER — Telehealth: Payer: Self-pay | Admitting: *Deleted

## 2024-12-02 DIAGNOSIS — J111 Influenza due to unidentified influenza virus with other respiratory manifestations: Secondary | ICD-10-CM | POA: Diagnosis not present

## 2024-12-02 DIAGNOSIS — O99512 Diseases of the respiratory system complicating pregnancy, second trimester: Secondary | ICD-10-CM

## 2024-12-02 DIAGNOSIS — Z3A14 14 weeks gestation of pregnancy: Secondary | ICD-10-CM | POA: Diagnosis not present

## 2024-12-02 DIAGNOSIS — Z91040 Latex allergy status: Secondary | ICD-10-CM | POA: Insufficient documentation

## 2024-12-02 MED ORDER — OSELTAMIVIR PHOSPHATE 75 MG PO CAPS: 75.0000 mg | ORAL_CAPSULE | Freq: Two times a day (BID) | ORAL | 0 refills | Status: DC

## 2024-12-02 NOTE — Telephone Encounter (Signed)
 RTC regarding question about results. No answer. Left HIPAA compliant message with call back number and encouraged communication via MyChart if that is desired.

## 2024-12-02 NOTE — MAU Note (Signed)
 Joyce Solomon is a 39 y.o. at [redacted]w[redacted]d here in MAU reporting catching the flu from her daughter this wkend and pt feels bad. Chills and congestion with cough. Took Tylenol  but did not help. Tried Theraflu but not helping. Symptoms started Sunday. She has not tested for flu or covid but her daughter tested positive for flu  LMP: na Onset of complaint: Sunday Pain score: 8 Vitals:   12/02/24 2122 12/02/24 2125  BP:  114/68  Pulse: 94   Resp: 17   Temp: 98.5 F (36.9 C)   SpO2: 100%      FHT: 163  Lab orders placed from triage: none

## 2024-12-02 NOTE — Discharge Instructions (Signed)
 It was a pleasure taking care of you today.  Given your daughter has the flu it is likely that this is the flu.  I will prescribe Tamiflu  which she will take twice daily for 5 days.  Please go get this as soon as possible and start it.  The earlier you started the more effective it is that improving your symptoms.  Below is a list of medications that are safe to take during pregnancy.  If you have any concerns please return for further evaluation.  I hope you have a great rest of your night!  Safe Medications in Pregnancy   Acne:  Benzoyl Peroxide  Salicylic Acid   Backache/Headache:  Tylenol : 2 regular strength every 4 hours OR               2 Extra strength every 6 hours   Colds/Coughs/Allergies:  Benadryl  (alcohol free) 25 mg every 6 hours as needed  Breath right strips  Claritin  Cepacol throat lozenges  Chloraseptic throat spray  Cold-Eeze- up to three times per day  Cough drops, alcohol free  Flonase  (by prescription only)  Guaifenesin  Mucinex  Robitussin DM (plain only, alcohol free)  Saline nasal spray/drops  Sudafed (pseudoephedrine ) & Actifed * use only after [redacted] weeks gestation and if you do not have high blood pressure  Tylenol   Vicks Vaporub  Zinc lozenges  Zyrtec   Constipation:  Colace  Ducolax suppositories  Fleet enema  Glycerin  suppositories  Metamucil  Milk of magnesia  Miralax   Senokot  Smooth move tea   Diarrhea:  Kaopectate  Imodium A-D   *NO pepto Bismol   Hemorrhoids:  Anusol   Anusol  HC  Preparation H  Tucks   Indigestion:  Tums  Maalox  Mylanta  Zantac  Pepcid    Insomnia:  Benadryl  (alcohol free) 25mg  every 6 hours as needed  Tylenol  PM  Unisom , no Gelcaps   Leg Cramps:  Tums  MagGel   Nausea/Vomiting:  Bonine  Dramamine  Emetrol  Ginger extract  Sea bands  Meclizine  Nausea medication to take during pregnancy:  Unisom  (doxylamine  succinate 25 mg tablets) Take one tablet daily at bedtime. If symptoms are not  adequately controlled, the dose can be increased to a maximum recommended dose of two tablets daily (1/2 tablet in the morning, 1/2 tablet mid-afternoon and one at bedtime).  Vitamin B6 100mg  tablets. Take one tablet twice a day (up to 200 mg per day).   Skin Rashes:  Aveeno products  Benadryl  cream or 25mg  every 6 hours as needed  Calamine Lotion  1% cortisone cream   Yeast infection:  Gyne-lotrimin 7  Monistat 7    **If taking multiple medications, please check labels to avoid duplicating the same active ingredients  **take medication as directed on the label  ** Do not exceed 4000 mg of tylenol  in 24 hours  **Do not take medications that contain aspirin  or ibuprofen 

## 2024-12-02 NOTE — MAU Provider Note (Signed)
 " History     CSN: [id number]  Arrival date and time: 12/02/24 1912   None     No chief complaint on file.  HPI Patient presenting for evaluation for flulike symptoms.  Patient reports that her daughter tested positive for flu over the weekend.  Her symptoms started approximately 2 days ago.  She has been having fever, chills, fatigue, cough, congestion, rhinorrhea, myalgias, headaches.  She has taken Tylenol  with minimal relief.  Denies any vaginal bleeding, leaking of fluids.  OB History     Gravida  8   Para  4   Term  4   Preterm  0   AB  3   Living  4      SAB  2   IAB  1   Ectopic  0   Multiple  0   Live Births  4           Past Medical History:  Diagnosis Date   Anxiety    Blood transfusion without reported diagnosis    Headache    Iron Deficiency Anemia 2008   s/p transfusions 2009, 2010; inconsistent compliance with PO iron.    Non compliance w medication regimen 06/2015   stopped Remicade , may not have been taking Azulfidine .   Seasonal allergies    Thyrotoxicosis    Ulcerative Colitis 2007   on Humira     Past Surgical History:  Procedure Laterality Date   CESAREAN SECTION N/A 12/09/2013   Procedure: Primary Cesarean Section Delivery Baby Girl @ 1959, Apgars 8/8;  Surgeon: Nathanel LELON Bunker, MD;  Location: [facility];  Service: Obstetrics;  Laterality: N/A;   CESAREAN SECTION N/A 08/14/2017   Procedure: CESAREAN SECTION;  Surgeon: Bunker Nathanel, MD;  Location: [facility] BIRTH[facility] SUITES;  Service: Obstetrics;  Laterality: N/A;   CESAREAN SECTION N/A 03/23/2020   Procedure: CESAREAN SECTION;  Surgeon: Herchel Gloris LABOR, MD;  Location: [facility];  Service: Obstetrics;  Laterality: N/A;   COLONOSCOPY  01/19/2011   Dr Lennard. no active UC   COLONOSCOPY  2019   KN-prep exc-   COLONOSCOPY W/ BIOPSIES N/A 04/2014   Dr Debrah: Left sided colitis from prox descending to rectum; no inflammation at or proximal to splenic flexure   DILATION AND  EVACUATION  08/21/2012   Procedure: DILATATION AND EVACUATION;  Surgeon: Nathanel LELON Bunker, MD;  Location: [facility];  Service: Gynecology   TONSILLECTOMY      Family History  Problem Relation Age of Onset   Stroke Mother    Diabetes Sister    Miscarriages / Stillbirths Other    Hypotension Neg Hx    Alcohol abuse Neg Hx    Arthritis Neg Hx    Asthma Neg Hx    Birth defects Neg Hx    COPD Neg Hx    Depression Neg Hx    Drug abuse Neg Hx    Hearing loss Neg Hx    Heart disease Neg Hx    Hyperlipidemia Neg Hx    Hypertension Neg Hx    Kidney disease Neg Hx    Learning disabilities Neg Hx    Mental illness Neg Hx    Mental retardation Neg Hx    Colon cancer Neg Hx    Colon polyps Neg Hx    Esophageal cancer Neg Hx    Stomach cancer Neg Hx    Rectal cancer Neg Hx     Social History[1]  Allergies: Allergies[2]  Medications Prior to Admission  Medication Sig  Dispense Refill Last Dose/Taking   aspirin  EC 81 MG tablet Take 1 tablet (81 mg total) by mouth daily. Start taking when you are [redacted] weeks pregnant for rest of pregnancy for prevention of preeclampsia (Patient not taking: Reported on 12/02/2024) 300 tablet 2 Not Taking   Blood Pressure Monitoring (BLOOD PRESSURE KIT) DEVI 1 Device by Does not apply route once a week. 1 each 0    Doxylamine -Pyridoxine  (DICLEGIS ) 10-10 MG TBEC Take 2 tablets by mouth at bedtime. If symptoms persist, add one tablet in the morning and one in the afternoon 100 tablet 5    ondansetron  (ZOFRAN -ODT) 4 MG disintegrating tablet Take 1 tablet (4 mg total) by mouth every 6 (six) hours as needed for nausea. 20 tablet 2    Prenatal Vit-Fe Phos-FA-Omega (VITAFOL  GUMMIES) 3.33-0.333-34.8 MG CHEW Chew 3 tablets by mouth daily. 90 tablet 11    promethazine  (PHENERGAN ) 25 MG tablet Take 1 tablet (25 mg total) by mouth every 6 (six) hours as needed for nausea or vomiting. (Patient not taking: Reported on 11/19/2024) 30 tablet 0    vedolizumab  (ENTYVIO ) 300 MG  injection Administer as infusion over 30 minutes every 8 weeks 1 each 6 More than a month   zolpidem  (AMBIEN ) 10 MG tablet Take 10 mg by mouth at bedtime as needed. (Patient not taking: Reported on 12/02/2024)   Not Taking    Review of Systems  Constitutional:  Positive for chills, fatigue and fever.  HENT:  Positive for congestion and rhinorrhea.   Respiratory:  Negative for shortness of breath.   Cardiovascular:  Negative for chest pain.  Gastrointestinal:  Negative for abdominal pain.  Genitourinary:  Negative for vaginal bleeding.  Musculoskeletal:  Positive for myalgias.  Neurological:  Positive for headaches.   Physical Exam   Blood pressure 114/68, pulse 94, temperature 98.5 F (36.9 C), resp. rate 17, height 4' 11 (1.499 m), weight 68.9 kg, last menstrual period 08/22/2024, SpO2 100%.  Physical Exam Vitals and nursing note reviewed.  Constitutional:      Appearance: Normal appearance.  HENT:     Head: Normocephalic and atraumatic.     Nose: Congestion and rhinorrhea present.  Eyes:     Extraocular Movements: Extraocular movements intact.  Cardiovascular:     Rate and Rhythm: Normal rate.  Pulmonary:     Effort: Pulmonary effort is normal.  Abdominal:     Palpations: Abdomen is soft.     Tenderness: There is no abdominal tenderness.  Musculoskeletal:        General: Normal range of motion.     Cervical back: Normal range of motion.  Skin:    General: Skin is warm.     Capillary Refill: Capillary refill takes less than 2 seconds.  Neurological:     General: No focal deficit present.     Mental Status: She is alert.     Cranial Nerves: No cranial nerve deficit.  Psychiatric:        Mood and Affect: Mood normal.        Behavior: Behavior normal.     MAU Course  Procedures  MDM Physical exam  Assessment and Plan  Joyce Solomon is a 39 year old G8, P4 at 14 weeks presenting for flulike symptoms  Influenza Patient presenting with flulike symptoms.   Has not taken flu test.  Her daughter did test positive for flu over the weekend and she is having the exact same symptoms.  Has been taking OTC Tylenol  but no other medications.  Given close contact with someone with flu and pregnancy will treat with Tamiflu .  Symptoms have been going on for less than 48 hours at this point.  Prescription sent for Tamiflu  to 24-hour pharmacy so she can start it as soon as possible.  Discussed tricked return precautions.  Patient discharged home.  Joyce Solomon 12/02/2024, 10:22 PM     [1]  Social History Tobacco Use   Smoking status: Former    Current packs/day: 0.00    Types: Cigarettes    Start date: 12/14/2015    Quit date: 12/13/2018    Years since quitting: 5.9   Smokeless tobacco: Never   Tobacco comments:    smokes a cig every blue moon  Vaping Use   Vaping status: Never Used  Substance Use Topics   Alcohol use: Not Currently    Comment: special occasions   Drug use: Yes  [2]  Allergies Allergen Reactions   Latex Rash   Remicade  [Infliximab ] Itching   "

## 2024-12-03 LAB — RESP PANEL BY RT-PCR (RSV, FLU A&B, COVID)  RVPGX2
Influenza A by PCR: POSITIVE — AB
Influenza B by PCR: NEGATIVE
Resp Syncytial Virus by PCR: NEGATIVE
SARS Coronavirus 2 by RT PCR: NEGATIVE

## 2024-12-10 ENCOUNTER — Encounter (HOSPITAL_COMMUNITY): Payer: Self-pay | Admitting: Obstetrics and Gynecology

## 2024-12-10 ENCOUNTER — Inpatient Hospital Stay (HOSPITAL_COMMUNITY)
Admission: AD | Admit: 2024-12-10 | Discharge: 2024-12-10 | Disposition: A | Discharge status: Home / Self Care | Payer: Self-pay | Attending: Obstetrics and Gynecology | Admitting: Obstetrics and Gynecology

## 2024-12-10 ENCOUNTER — Inpatient Hospital Stay (EMERGENCY_DEPARTMENT_HOSPITAL)

## 2024-12-10 DIAGNOSIS — M79652 Pain in left thigh: Secondary | ICD-10-CM | POA: Diagnosis not present

## 2024-12-10 DIAGNOSIS — M545 Low back pain, unspecified: Secondary | ICD-10-CM | POA: Diagnosis not present

## 2024-12-10 DIAGNOSIS — M79605 Pain in left leg: Secondary | ICD-10-CM | POA: Diagnosis not present

## 2024-12-10 DIAGNOSIS — O26892 Other specified pregnancy related conditions, second trimester: Secondary | ICD-10-CM | POA: Insufficient documentation

## 2024-12-10 DIAGNOSIS — O0990 Supervision of high risk pregnancy, unspecified, unspecified trimester: Secondary | ICD-10-CM

## 2024-12-10 DIAGNOSIS — O34219 Maternal care for unspecified type scar from previous cesarean delivery: Secondary | ICD-10-CM | POA: Insufficient documentation

## 2024-12-10 DIAGNOSIS — O09522 Supervision of elderly multigravida, second trimester: Secondary | ICD-10-CM | POA: Diagnosis not present

## 2024-12-10 DIAGNOSIS — Z91040 Latex allergy status: Secondary | ICD-10-CM | POA: Insufficient documentation

## 2024-12-10 DIAGNOSIS — Z3A15 15 weeks gestation of pregnancy: Secondary | ICD-10-CM | POA: Diagnosis not present

## 2024-12-10 DIAGNOSIS — M5442 Lumbago with sciatica, left side: Secondary | ICD-10-CM

## 2024-12-10 NOTE — Progress Notes (Signed)
 LLE venous duplex has been completed.  Preliminary results given to Olam Dalton, NP.   Results can be found under chart review under CV PROC. 12/10/2024 2:30 PM Lillie Portner RVT, RDMS

## 2024-12-10 NOTE — MAU Note (Signed)
 Joyce Solomon is a 39 y.o. at [redacted]w[redacted]d here in MAU reporting: pain in the back of her left thigh . Has had it for 2 weeks. Called ob and told to come in because it might be a blood clot.   LMP:  Onset of complaint: 2 weeks Pain score: 10 Vitals:   12/10/24 1122  BP: 113/64  Pulse: 89  Temp: 98.5 F (36.9 C)     FHT: 136  Lab orders placed from triage:

## 2024-12-10 NOTE — MAU Provider Note (Signed)
 " History     CSN: [id number]  Arrival date and time: 12/10/24 1104   None     Chief Complaint  Patient presents with   Leg Pain   HPI  39 yo [id number] @ 15+5 hx c/s x3 and UC on Humira , presenting with one week of low back pain with pain radiating down posterior left leg into the thigh. No weakness or numbness. No trauma. Doesn't think there is much swelling. No skin changes. Able to bear weight, jump, etc without worsening pain. Pain is worse with walking.  Past Medical History:  Diagnosis Date   Anxiety    Blood transfusion without reported diagnosis    Headache    Iron Deficiency Anemia 2008   s/p transfusions 2009, 2010; inconsistent compliance with PO iron.    Non compliance w medication regimen 06/2015   stopped Remicade , may not have been taking Azulfidine .   Seasonal allergies    Thyrotoxicosis    Ulcerative Colitis 2007   on Humira     Past Surgical History:  Procedure Laterality Date   CESAREAN SECTION N/A 12/09/2013   Procedure: Primary Cesarean Section Delivery Baby Girl @ 1959, Apgars 8/8;  Surgeon: Nathanel LELON Bunker, MD;  Location: [facility];  Service: Obstetrics;  Laterality: N/A;   CESAREAN SECTION N/A 08/14/2017   Procedure: CESAREAN SECTION;  Surgeon: Bunker Nathanel, MD;  Location: [facility] BIRTHING SUIT[facility];  Service: Obstetrics;  Laterality: N/A;   CESAREAN SECTION N/A 03/23/2020   Procedure: CESAREAN SECTION;  Surgeon: Herchel Gloris LABOR, MD;  Location: [facility];  Service: Obstetrics;  Laterality: N/A;   COLONOSCOPY  01/19/2011   Dr Lennard. no active UC   COLONOSCOPY  2019   KN-prep exc-   COLONOSCOPY W/ BIOPSIES N/A 04/2014   Dr Debrah: Left sided colitis from prox descending to rectum; no inflammation at or proximal to splenic flexure   DILATION AND EVACUATION  08/21/2012   Procedure: DILATATION AND EVACUATION;  Surgeon: Nathanel LELON Bunker, MD;  Location: [facility];  Service: Gynecology   TONSILLECTOMY      Family History  Problem Relation Age of Onset    Stroke Mother    Diabetes Sister    Miscarriages / Stillbirths Other    Hypotension Neg Hx    Alcohol abuse Neg Hx    Arthritis Neg Hx    Asthma Neg Hx    Birth defects Neg Hx    COPD Neg Hx    Depression Neg Hx    Drug abuse Neg Hx    Hearing loss Neg Hx    Heart disease Neg Hx    Hyperlipidemia Neg Hx    Hypertension Neg Hx    Kidney disease Neg Hx    Learning disabilities Neg Hx    Mental illness Neg Hx    Mental retardation Neg Hx    Colon cancer Neg Hx    Colon polyps Neg Hx    Esophageal cancer Neg Hx    Stomach cancer Neg Hx    Rectal cancer Neg Hx     Social History[1]  Allergies: Allergies[2]  Medications Prior to Admission  Medication Sig Dispense Refill Last Dose/Taking   aspirin  EC 81 MG tablet Take 1 tablet (81 mg total) by mouth daily. Start taking when you are [redacted] weeks pregnant for rest of pregnancy for prevention of preeclampsia (Patient not taking: Reported on 12/02/2024) 300 tablet 2    Blood Pressure Monitoring (BLOOD PRESSURE KIT) DEVI 1 Device by Does not apply route once a  week. 1 each 0    Doxylamine -Pyridoxine  (DICLEGIS ) 10-10 MG TBEC Take 2 tablets by mouth at bedtime. If symptoms persist, add one tablet in the morning and one in the afternoon 100 tablet 5    ondansetron  (ZOFRAN -ODT) 4 MG disintegrating tablet Take 1 tablet (4 mg total) by mouth every 6 (six) hours as needed for nausea. 20 tablet 2    oseltamivir  (TAMIFLU ) 75 MG capsule Take 1 capsule (75 mg total) by mouth every 12 (twelve) hours. 10 capsule 0    Prenatal Vit-Fe Phos-FA-Omega (VITAFOL  GUMMIES) 3.33-0.333-34.8 MG CHEW Chew 3 tablets by mouth daily. 90 tablet 11    promethazine  (PHENERGAN ) 25 MG tablet Take 1 tablet (25 mg total) by mouth every 6 (six) hours as needed for nausea or vomiting. (Patient not taking: Reported on 11/19/2024) 30 tablet 0    vedolizumab  (ENTYVIO ) 300 MG injection Administer as infusion over 30 minutes every 8 weeks 1 each 6    zolpidem  (AMBIEN ) 10 MG tablet Take  10 mg by mouth at bedtime as needed. (Patient not taking: Reported on 12/02/2024)       Review of Systems Physical Exam   Blood pressure 97/60, pulse 81, temperature 98.5 F (36.9 C), height 4' 11 (1.499 m), weight 70.3 kg, last menstrual period 08/22/2024, SpO2 100%.  Physical Exam  NAD RRR CTAB Abdomen soft Diffuse low back tenderness no spinous process tenderness Normal legs  FHT 130s  Assessment and Plan   # Low back pain # Left thigh pain Suspect lumbago with sciatica though pain doesn't clearly radiate to below the knee. No weakness/numbness to suggest cord involvement. DVT u/s negative for DVT. No trauma and bearing weight doesn't aggravate pain so doubt bony involvement. - discharge home with pain control and activity modification instructions  Devaughn KATHEE Ban 12/10/2024, 2:34 PM     [1]  Social History Tobacco Use   Smoking status: Former    Current packs/day: 0.00    Types: Cigarettes    Start date: 12/14/2015    Quit date: 12/13/2018    Years since quitting: 5.9   Smokeless tobacco: Never   Tobacco comments:    smokes a cig every blue moon  Vaping Use   Vaping status: Never Used  Substance Use Topics   Alcohol use: Not Currently    Comment: special occasions   Drug use: Yes  [2]  Allergies Allergen Reactions   Latex Rash   Remicade  [Infliximab ] Itching   "

## 2024-12-17 ENCOUNTER — Ambulatory Visit (INDEPENDENT_AMBULATORY_CARE_PROVIDER_SITE_OTHER): Payer: Self-pay | Admitting: Physician Assistant

## 2024-12-17 VITALS — BP 111/60 | HR 89 | Wt 158.0 lb

## 2024-12-17 DIAGNOSIS — Z3A16 16 weeks gestation of pregnancy: Secondary | ICD-10-CM | POA: Diagnosis not present

## 2024-12-17 DIAGNOSIS — K51819 Other ulcerative colitis with unspecified complications: Secondary | ICD-10-CM | POA: Diagnosis not present

## 2024-12-17 DIAGNOSIS — O09299 Supervision of pregnancy with other poor reproductive or obstetric history, unspecified trimester: Secondary | ICD-10-CM

## 2024-12-17 DIAGNOSIS — O09292 Supervision of pregnancy with other poor reproductive or obstetric history, second trimester: Secondary | ICD-10-CM

## 2024-12-17 DIAGNOSIS — Z98891 History of uterine scar from previous surgery: Secondary | ICD-10-CM | POA: Diagnosis not present

## 2024-12-17 DIAGNOSIS — O0990 Supervision of high risk pregnancy, unspecified, unspecified trimester: Secondary | ICD-10-CM

## 2024-12-17 DIAGNOSIS — D649 Anemia, unspecified: Secondary | ICD-10-CM | POA: Diagnosis not present

## 2024-12-17 DIAGNOSIS — K5190 Ulcerative colitis, unspecified, without complications: Secondary | ICD-10-CM | POA: Insufficient documentation

## 2024-12-17 DIAGNOSIS — O0992 Supervision of high risk pregnancy, unspecified, second trimester: Secondary | ICD-10-CM | POA: Diagnosis not present

## 2024-12-17 NOTE — Patient Instructions (Signed)
 For stretching poses in pregnancy, go to the following website:  [email or web address]

## 2024-12-17 NOTE — Progress Notes (Signed)
 ROB in office, denies pain Reports feeling some fetal flutters

## 2024-12-17 NOTE — Progress Notes (Signed)
" ° °  PRENATAL VISIT NOTE  Subjective:  Joyce Solomon is a 39 y.o. [phone] at [redacted]w[redacted]d being seen today for ongoing prenatal care.  She is currently monitored for the following issues for this high-risk pregnancy and has Thyrotoxicosis; Anemia; DENTAL CARIES; Other ulcerative colitis without complications (HCC); Constipation; ACNE, MILD; Prior pregnancy complicated by SGA (small for gestational age), antepartum; Rectal bleeding; Anal fissure; C. difficile diarrhea; Subclinical hyperthyroidism; Supervision of high risk pregnancy, antepartum; History of 3 cesarean section; and Ulcerative colitis (HCC) on their problem list.  Patient reports no complaints.  Contractions: Not present. Vag. Bleeding: None.   . Denies leaking of fluid.   The following portions of the patient's history were reviewed and updated as appropriate: allergies, current medications, past family history, past medical history, past social history, past surgical history and problem list.   Objective:   Vitals:   12/17/24 1409  BP: 111/60  Pulse: 89  Weight: 158 lb (71.7 kg)    Fetal Status:  Fetal Heart Rate (bpm): 143        General: Alert, oriented and cooperative. Patient is in no acute distress.  Skin: Skin is warm and dry. No rash noted.   Cardiovascular: Normal heart rate noted  Respiratory: Normal respiratory effort, no problems with respiration noted  Abdomen: Soft, gravid, appropriate for gestational age.  Pain/Pressure: Absent     Pelvic: Cervical exam deferred        Extremities: Normal range of motion.  Edema: None  Mental Status: Normal mood and affect. Normal behavior. Normal judgment and thought content.      10/21/2024   10:01 AM  Depression screen PHQ 2/9  Decreased Interest 0  Down, Depressed, Hopeless 0  PHQ - 2 Score 0  Altered sleeping 2  Tired, decreased energy 1  Change in appetite 0  Feeling bad or failure about yourself  0  Trouble concentrating 0  Moving slowly or fidgety/restless 1   Suicidal thoughts 0  PHQ-9 Score 4        10/21/2024   10:02 AM  GAD 7 : Generalized Anxiety Score  Nervous, Anxious, on Edge 0   Control/stop worrying 0   Worry too much - different things 0   Trouble relaxing 0   Restless 0   Easily annoyed or irritable 1   Afraid - awful might happen 0   Total GAD 7 Score 1     Data saved with a previous flowsheet row definition    Assessment and Plan:  Pregnancy: [id number] at [redacted]w[redacted]d  1. Supervision of high risk pregnancy, antepartum (Primary) Patient doing well BP, FHR appropriate   2. [redacted] weeks gestation of pregnancy Anticipatory guidance about next visits/weeks of pregnancy given.   3. History of 3 cesarean section Repeat CSD  4. Prior pregnancy complicated by SGA (small for gestational age), antepartum Patient cannot take ASA with h/o ulcerative colitis   5. Anemia, unspecified type 11/21/24 hgb 12.7  Preterm labor symptoms and general obstetric precautions including but not limited to vaginal bleeding, contractions, leaking of fluid and fetal movement were reviewed in detail with the patient.  Please refer to After Visit Summary for other counseling recommendations.   Return in about 4 weeks (around 01/14/2025) for [facility].  Future Appointments  Date Time Provider Department Center  01/06/2025  1:00 PM [facility] PROVIDER 1 [facility]  01/06/2025  1:30 PM [facility] US1 [facility]    Jorene FORBES Moats, PA-C  "

## 2024-12-19 ENCOUNTER — Ambulatory Visit: Payer: Self-pay | Admitting: Physician Assistant

## 2024-12-19 LAB — AFP, SERUM, OPEN SPINA BIFIDA
AFP MoM: 0.82
AFP Value: 28.5 ng/mL
Gest. Age on Collection Date: 16 wk
Maternal Age At EDD: 39.5 a
OSBR Risk 1 IN: 10000
Test Results:: NEGATIVE
Weight: 158 [lb_av]

## 2025-01-06 ENCOUNTER — Ambulatory Visit

## 2025-01-06 ENCOUNTER — Other Ambulatory Visit

## 2025-01-06 DIAGNOSIS — O0990 Supervision of high risk pregnancy, unspecified, unspecified trimester: Secondary | ICD-10-CM

## 2025-01-16 ENCOUNTER — Encounter: Payer: Self-pay | Admitting: Physician Assistant
